# Patient Record
Sex: Female | Born: 1955 | Race: White | Hispanic: No | Marital: Married | Smoking: Never smoker
Health system: Southern US, Community
[De-identification: ages and names within clinical notes are randomized; demographics above are authoritative.]

## PROBLEM LIST (undated history)

## (undated) ENCOUNTER — Encounter (HOSPITAL_COMMUNITY): Admission: RE | Payer: Self-pay | Source: Ambulatory Visit

## (undated) DIAGNOSIS — C541 Malignant neoplasm of endometrium: Secondary | ICD-10-CM

## (undated) DIAGNOSIS — C55 Malignant neoplasm of uterus, part unspecified: Secondary | ICD-10-CM

## (undated) DIAGNOSIS — I480 Paroxysmal atrial fibrillation: Secondary | ICD-10-CM

## (undated) DIAGNOSIS — J45909 Unspecified asthma, uncomplicated: Secondary | ICD-10-CM

## (undated) DIAGNOSIS — I10 Essential (primary) hypertension: Secondary | ICD-10-CM

## (undated) DIAGNOSIS — M2550 Pain in unspecified joint: Secondary | ICD-10-CM

## (undated) DIAGNOSIS — G8929 Other chronic pain: Secondary | ICD-10-CM

## (undated) DIAGNOSIS — M25561 Pain in right knee: Secondary | ICD-10-CM

## (undated) DIAGNOSIS — K219 Gastro-esophageal reflux disease without esophagitis: Secondary | ICD-10-CM

## (undated) DIAGNOSIS — R7303 Prediabetes: Secondary | ICD-10-CM

## (undated) DIAGNOSIS — N183 Chronic kidney disease, stage 3 unspecified (CMS HCC): Secondary | ICD-10-CM

## (undated) DIAGNOSIS — G4730 Sleep apnea, unspecified: Secondary | ICD-10-CM

## (undated) DIAGNOSIS — M1990 Unspecified osteoarthritis, unspecified site: Secondary | ICD-10-CM

## (undated) DIAGNOSIS — I4891 Unspecified atrial fibrillation: Secondary | ICD-10-CM

## (undated) DIAGNOSIS — M25562 Pain in left knee: Secondary | ICD-10-CM

## (undated) DIAGNOSIS — E669 Obesity, unspecified: Secondary | ICD-10-CM

## (undated) HISTORY — PX: CARDIOVERSION: SHX1299

## (undated) HISTORY — PX: HX APPENDECTOMY: SHX54

## (undated) HISTORY — DX: Other chronic pain: G89.29

## (undated) HISTORY — PX: LAPAROSCOPIC TOTAL HYSTERECTOMY: SUR800

## (undated) HISTORY — PX: HX DILATION AND CURETTAGE: SHX78

## (undated) HISTORY — DX: Unspecified osteoarthritis, unspecified site: M19.90

## (undated) HISTORY — DX: Gastro-esophageal reflux disease without esophagitis: K21.9

## (undated) HISTORY — DX: Chronic kidney disease, stage 3 unspecified (CMS HCC): N18.30

## (undated) HISTORY — DX: Unspecified atrial fibrillation (CMS HCC): I48.91

## (undated) HISTORY — PX: COLONOSCOPY: WVUENDOPRO10

## (undated) HISTORY — DX: Unspecified asthma, uncomplicated: J45.909

## (undated) HISTORY — DX: Obesity, unspecified: E66.9

## (undated) HISTORY — DX: Pain in right knee: M25.561

## (undated) HISTORY — DX: Pain in left knee: M25.562

## (undated) HISTORY — DX: Prediabetes: R73.03

## (undated) HISTORY — DX: Essential (primary) hypertension: I10

## (undated) HISTORY — PX: HX HYSTERECTOMY: SHX81

## (undated) HISTORY — PX: DILATION AND CURETTAGE OF UTERUS: SHX78

## (undated) HISTORY — PX: NASAL SINUS SURGERY: SHX719

## (undated) HISTORY — PX: ABLATION: SHX5711

## (undated) HISTORY — DX: Paroxysmal atrial fibrillation: I48.0

## (undated) HISTORY — DX: Malignant neoplasm of endometrium: C54.1

## (undated) HISTORY — PX: APPENDECTOMY: SHX54

## (undated) HISTORY — DX: Morbid (severe) obesity due to excess calories: E66.01

## (undated) SURGERY — CARDIAC ABLATION - AV NODE
Anesthesia: Monitor Anesthesia Care

---
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
## 2002-04-07 HISTORY — PX: SINUS SURGERY: SHX187

## 2003-04-16 ENCOUNTER — Emergency Department (HOSPITAL_COMMUNITY): Admission: AD | Admit: 2003-04-16 | Discharge: 2003-04-16 | Payer: Self-pay | Admitting: Family Medicine

## 2003-04-17 ENCOUNTER — Ambulatory Visit (HOSPITAL_COMMUNITY): Admission: RE | Admit: 2003-04-17 | Discharge: 2003-04-17 | Payer: Self-pay | Admitting: Family Medicine

## 2004-01-11 ENCOUNTER — Emergency Department (HOSPITAL_COMMUNITY): Admission: EM | Admit: 2004-01-11 | Discharge: 2004-01-11 | Payer: Self-pay | Admitting: Family Medicine

## 2005-01-23 ENCOUNTER — Other Ambulatory Visit: Admission: RE | Admit: 2005-01-23 | Discharge: 2005-01-23 | Payer: Self-pay | Admitting: Obstetrics and Gynecology

## 2005-02-11 ENCOUNTER — Ambulatory Visit: Payer: Self-pay | Admitting: Cardiology

## 2005-03-06 ENCOUNTER — Ambulatory Visit: Payer: Self-pay | Admitting: Cardiology

## 2005-04-08 ENCOUNTER — Ambulatory Visit: Payer: Self-pay

## 2005-04-08 ENCOUNTER — Encounter: Payer: Self-pay | Admitting: Internal Medicine

## 2005-04-14 ENCOUNTER — Ambulatory Visit: Payer: Self-pay

## 2005-10-27 ENCOUNTER — Ambulatory Visit: Payer: Self-pay | Admitting: Internal Medicine

## 2006-04-13 ENCOUNTER — Ambulatory Visit: Payer: Self-pay | Admitting: Internal Medicine

## 2006-04-28 ENCOUNTER — Ambulatory Visit: Payer: Self-pay | Admitting: Internal Medicine

## 2006-05-25 ENCOUNTER — Ambulatory Visit: Payer: Self-pay | Admitting: Internal Medicine

## 2006-08-03 ENCOUNTER — Ambulatory Visit: Payer: Self-pay | Admitting: Internal Medicine

## 2008-07-21 ENCOUNTER — Ambulatory Visit: Payer: Self-pay | Admitting: Cardiology

## 2008-07-21 ENCOUNTER — Emergency Department (HOSPITAL_COMMUNITY): Admission: EM | Admit: 2008-07-21 | Discharge: 2008-07-21 | Payer: Self-pay | Admitting: Emergency Medicine

## 2008-11-21 ENCOUNTER — Ambulatory Visit: Payer: Self-pay | Admitting: Vascular Surgery

## 2008-11-21 ENCOUNTER — Ambulatory Visit: Admission: RE | Admit: 2008-11-21 | Discharge: 2008-11-21 | Payer: Self-pay | Admitting: Orthopedic Surgery

## 2008-11-21 ENCOUNTER — Encounter (INDEPENDENT_AMBULATORY_CARE_PROVIDER_SITE_OTHER): Payer: Self-pay | Admitting: Orthopedic Surgery

## 2008-12-14 ENCOUNTER — Encounter: Admission: RE | Admit: 2008-12-14 | Discharge: 2008-12-14 | Payer: Self-pay | Admitting: Family Medicine

## 2008-12-15 ENCOUNTER — Other Ambulatory Visit: Admission: RE | Admit: 2008-12-15 | Discharge: 2008-12-15 | Payer: Self-pay | Admitting: Family Medicine

## 2009-08-20 ENCOUNTER — Encounter: Admission: RE | Admit: 2009-08-20 | Discharge: 2009-08-20 | Payer: Self-pay | Admitting: Family Medicine

## 2009-11-23 ENCOUNTER — Encounter: Admission: RE | Admit: 2009-11-23 | Discharge: 2009-11-23 | Payer: Self-pay | Admitting: Obstetrics and Gynecology

## 2010-05-08 ENCOUNTER — Encounter: Payer: Self-pay | Admitting: Family Medicine

## 2010-05-17 ENCOUNTER — Inpatient Hospital Stay (HOSPITAL_COMMUNITY)
Admission: EM | Admit: 2010-05-17 | Discharge: 2010-05-19 | DRG: 139 | Disposition: A | Discharge status: Home / Self Care | Payer: BC Managed Care – PPO | Attending: Cardiology | Admitting: Cardiology

## 2010-05-17 ENCOUNTER — Emergency Department (HOSPITAL_COMMUNITY): Payer: BC Managed Care – PPO

## 2010-05-17 DIAGNOSIS — I4891 Unspecified atrial fibrillation: Secondary | ICD-10-CM

## 2010-05-17 DIAGNOSIS — I10 Essential (primary) hypertension: Secondary | ICD-10-CM | POA: Diagnosis present

## 2010-05-17 DIAGNOSIS — R002 Palpitations: Secondary | ICD-10-CM | POA: Diagnosis present

## 2010-05-17 DIAGNOSIS — J45909 Unspecified asthma, uncomplicated: Secondary | ICD-10-CM | POA: Diagnosis present

## 2010-05-17 DIAGNOSIS — E119 Type 2 diabetes mellitus without complications: Secondary | ICD-10-CM | POA: Diagnosis present

## 2010-05-17 LAB — GLUCOSE, CAPILLARY

## 2010-05-17 LAB — CBC
HCT: 44.3 % (ref 36.0–46.0)
Hemoglobin: 15.4 g/dL — ABNORMAL HIGH (ref 12.0–15.0)
MCV: 86.4 fL (ref 78.0–100.0)
RBC: 5.13 MIL/uL — ABNORMAL HIGH (ref 3.87–5.11)
RDW: 12.2 % (ref 11.5–15.5)
WBC: 9.4 10*3/uL (ref 4.0–10.5)

## 2010-05-17 LAB — COMPREHENSIVE METABOLIC PANEL
Alkaline Phosphatase: 103 U/L (ref 39–117)
BUN: 16 mg/dL (ref 6–23)
CO2: 24 mEq/L (ref 19–32)
Chloride: 104 mEq/L (ref 96–112)
Creatinine, Ser: 0.99 mg/dL (ref 0.4–1.2)
Glucose, Bld: 123 mg/dL — ABNORMAL HIGH (ref 70–99)
Sodium: 139 mEq/L (ref 135–145)
Total Bilirubin: 0.6 mg/dL (ref 0.3–1.2)

## 2010-05-17 LAB — CARDIAC PANEL(CRET KIN+CKTOT+MB+TROPI)
CK, MB: 1.2 ng/mL (ref 0.3–4.0)
Relative Index: 1.2 (ref 0.0–2.5)
Total CK: 100 U/L (ref 7–177)

## 2010-05-17 LAB — DIFFERENTIAL
Basophils Relative: 0 % (ref 0–1)
Eosinophils Absolute: 0.5 10*3/uL (ref 0.0–0.7)
Eosinophils Relative: 5 % (ref 0–5)
Lymphs Abs: 2.6 10*3/uL (ref 0.7–4.0)
Neutro Abs: 5.8 10*3/uL (ref 1.7–7.7)

## 2010-05-17 LAB — APTT: aPTT: 30 seconds (ref 24–37)

## 2010-05-17 LAB — POCT CARDIAC MARKERS
CKMB, poc: <1 ng/mL — ABNORMAL LOW (ref 1.0–8.0)
Troponin i, poc: <0.05 ng/mL (ref 0.00–0.09)

## 2010-05-17 LAB — BRAIN NATRIURETIC PEPTIDE: Pro B Natriuretic peptide (BNP): <30 pg/mL (ref 0.0–100.0)

## 2010-05-17 LAB — PROTIME-INR
INR: 0.98 (ref 0.00–1.49)
Prothrombin Time: 13.2 seconds (ref 11.6–15.2)

## 2010-05-18 LAB — CARDIAC PANEL(CRET KIN+CKTOT+MB+TROPI)
CK, MB: 1.1 ng/mL (ref 0.3–4.0)
CK, MB: 1 ng/mL (ref 0.3–4.0)
Relative Index: INVALID (ref 0.0–2.5)
Total CK: 95 U/L (ref 7–177)
Troponin I: 0.01 ng/mL (ref 0.00–0.06)
Troponin I: <0.01 ng/mL (ref 0.00–0.06)

## 2010-05-18 LAB — BASIC METABOLIC PANEL
CO2: 28 mEq/L (ref 19–32)
Chloride: 104 mEq/L (ref 96–112)
Glucose, Bld: 118 mg/dL — ABNORMAL HIGH (ref 70–99)
Potassium: 3.7 mEq/L (ref 3.5–5.1)

## 2010-05-18 LAB — GLUCOSE, CAPILLARY
Glucose-Capillary: 131 mg/dL — ABNORMAL HIGH (ref 70–99)
Glucose-Capillary: 128 mg/dL — ABNORMAL HIGH (ref 70–99)
Glucose-Capillary: 106 mg/dL — ABNORMAL HIGH (ref 70–99)

## 2010-05-19 LAB — BASIC METABOLIC PANEL
BUN: 11 mg/dL (ref 6–23)
Calcium: 8.5 mg/dL (ref 8.4–10.5)
Chloride: 106 mEq/L (ref 96–112)
Creatinine, Ser: 0.88 mg/dL (ref 0.4–1.2)
GFR calc Af Amer: >60 mL/min (ref >60)
GFR calc non Af Amer: >60 mL/min (ref >60)

## 2010-05-19 LAB — CBC
MCH: 30 pg (ref 26.0–34.0)
MCHC: 34.5 g/dL (ref 30.0–36.0)
MCV: 86.8 fL (ref 78.0–100.0)
Platelets: 269 10*3/uL (ref 150–400)
RDW: 12.5 % (ref 11.5–15.5)

## 2010-05-19 LAB — GLUCOSE, CAPILLARY: Glucose-Capillary: 110 mg/dL — ABNORMAL HIGH (ref 70–99)

## 2010-05-20 NOTE — Discharge Summary (Signed)
Alexis [name], PROUSE               ACCOUNT NO.:  [id number]  MEDICAL RECORD NO.:  [id number]           PATIENT TYPE:  I  LOCATION:  1238                         FACILITY:  [facility]  PHYSICIAN:  Jake Bathe, MD      DATE OF BIRTH:  January 27, 1956  DATE OF ADMISSION:  05/17/2010 DATE OF DISCHARGE:  05/19/2010                              DISCHARGE SUMMARY   CARDIOLOGIST:  Jake Bathe, MD  FINAL DIAGNOSES: 1. Newly discovered atrial fibrillation with rapid ventricular     response. 2. Morbid obesity. 3. Hypertension. 4. Asthma.  ALLERGIES:  SULFA, possibly PREDNISONE.  DISCHARGE MEDICATIONS: 1. Pradaxa 150 mg p.o. b.i.d. 2. Diltiazem extended release 360 mg once a day. 3. Lisinopril 10 mg once a day. 4. Metoprolol 25 mg twice a day. 5. Ibuprofen as needed. 6. Meloxicam as needed. 7. Symbicort. 8. Budesonide/formoterol 160/4.5 mcg 1 puff daily. 9. Tramadol as needed. 10.Ventolin 2 puffs as needed.  She is going to stop taking her lisinopril/hydrochlorothiazide 20/25 combination.  Substitute will be lisinopril 10 mg as above.  I have increased her diltiazem up to 360 and new start metoprolol 25 twice a day.  HOSPITAL COURSE:  She was admitted on May 17, 2010, with atrial fibrillation, rapid ventricular response which was symptomatic due to palpitations.  Approximately 2 years prior, she had palpitations and was evaluated by me in the clinic setting and there was no evidence of atrial fibrillation at that time.  Her atrial fibrillation for approximately 24 hours was difficult to control but with the addition with an IV drip of 15 mg per hour of diltiazem which was transitioned over p.o. as well as the addition of metoprolol 25 mg twice a day after approximately 36 hours, she converted to sinus rhythm.  Pradaxa was discussed with her for anticoagulation and I do believe that she should require anticoagulation given her other comorbidities.  We have discussed possible  GERD like side effects of Pradaxa and she should take this with a meal.  She was not interested in taking Coumadin.  It was also discussed with her that if she develops atrial fibrillation once again that we could consider antiarrhythmic therapy.  She did have a nuclear stress test done approximately 2 years ago which was overall low risk with no evidence of ischemia, however, this test was suboptimal due to body habitus.  Her echocardiogram done on this admission also was suboptimal due to body habitus.  I am unable to accurately estimated left ventricular ejection fraction.  On the night prior to discharge, she did have blood pressure during the night in the upper 90s, asymptomatic, and her heart rate after she converted got as low as 59 beats per minute.  No syncope, no dizziness. She is ambulating well.  LABORATORY DATA:  Sodium 140, potassium 3.8, BUN 11, creatinine was 0.8. White count 9.4, hemoglobin 13.4, hematocrit 38.8, platelets 269.  TSH was 4.6, slightly above normal.  Cardiac biomarkers were negative and LFTs were normal.  Chest x-ray showed decreased lung volumes with mild vascular congestion compared to prior study.  FOLLOWUP:  She is to have followup  with me in 1 week.  I have given her office number to call.  Discharge time was 35 minutes with med reconciliation, lab work, discussion with the patient, teaching.     Jake Bathe, MD     MCS/MEDQ  D:  05/19/2010  T:  05/19/2010  Job:  [id number]  Electronically Signed by Donato Schultz MD on 05/20/2010 06:36:03 AM

## 2010-05-21 NOTE — H&P (Signed)
Alexis [name], Alexis [name] NO.:  [id number]  MEDICAL RECORD NO.:  [id number]           PATIENT TYPE:  I  LOCATION:  1238                         FACILITY:  [facility]  PHYSICIAN:  Armanda Magic, M.D.     DATE OF BIRTH:  [birth year]  DATE OF ADMISSION:  05/17/2010 DATE OF DISCHARGE:                             HISTORY & PHYSICAL   REFERRING PHYSICIAN:  Lorre Nick, MD, at [facility].  CHIEF COMPLAINT:  Atrial fibrillation with rapid ventricular response of questionable duration.  HISTORY OF PRESENT ILLNESS:  This is a 55 year old obese white female with a history of morbid obesity, palpitations, and hypertension who presented to the emergency room after awakening with nausea.  She says she woke up this morning and felt very nauseated after she rolled over. She went and sat up in a chair and noticed her heart was racing.  She denied any chest pain but has some exertional weakness and dyspnea on exertion.  She also complains of lower extremity edema which she thinks has gotten worse recently, but she has a sedentary job and sits a lot. She did have several cups of caffeinated coffee last evening.  When asking her if she felt the palpitations last night, she says that she did not feel right last night, but she is not really sure she had palpitations.  PAST MEDICAL HISTORY: 1. Morbid obesity. 2. Hypertension. 3. Asthma. There is no history of documented coronary disease.  One of her prior consultations in the hospital showed there was a history of diabetes but she denies this.  ALLERGIES:  SULFA and questionably PREDNISONE.  MEDICATIONS: 1. Symbicort 160 mcg per 4.5 two puffs b.i.d. 2. Albuterol p.r.n. 3. Lisinopril 10 mg daily.  SOCIAL HISTORY:  She is married.  She denies any alcohol or tobacco use. She does not have any children.  FAMILY HISTORY:  Her mother has questionable heart problem.  Father died of an MI at 48.  She has two brothers  and three sisters, none with heart disease.  REVIEW OF SYSTEMS:  Other than what is stated in the HPI is negative.  PHYSICAL EXAM:  VITAL SIGNS:  Blood pressure is 109/37, heart rate 151 beats per minute. GENERAL:  She is a well-developed, well-nourished morbidly obese white female, in no acute distress. HEENT:  Benign. NECK:  Supple without lymphadenopathy. Carotid upstrokes +2 bilaterally. No bruits. LUNGS:  Clear to auscultation throughout. HEART:  Irregularly irregular and tachycardic.  No murmurs, rubs, or gallops. ABDOMEN:  Soft, nontender, nondistended.  Normoactive bowel sounds.  No hepatosplenomegaly. EXTREMITIES:  No edema.  LABORATORY DATA:  Sodium 139, potassium 3.8, chloride 104, bicarb 24, BUN 16, creatinine 0.99.  White cell count 9.4, hemoglobin 15.4, hematocrit 44.3, platelet count 256.  BNP less than 30, INR 0.98. EKG shows atrial fibrillation with rapid ventricular response. Point-of-care markers are negative x2. Her chest x-ray shows decreased lung volumes with mild vascular congestion.  No significant changes, otherwise.  ASSESSMENT: 1. New-onset atrial fibrillation with rapid ventricular response of     questionable duration. 2. Hypertension. 3. Asthma. 4. Morbid obesity.  PLAN:  Admit  to step-down unit.  We will start IV Cardizem drip to control heart rate and titrate to keep the heart rate less than 100 as long as systolic blood pressure is greater than 100.  Check TSH.  Subcu Lovenox per pharmacy, full dose, and we will check cardiac enzymes and check 2-D echocardiogram to assess LV function.     Armanda Magic, M.D.     TT/MEDQ  D:  05/17/2010  T:  05/18/2010  Job:  [id number]  cc:   Jake Bathe, MD  Electronically Signed by Armanda Magic M.D. on 05/21/2010 12:09:01 PM

## 2010-07-17 LAB — POCT I-STAT, CHEM 8
Calcium, Ion: 1.17 mmol/L (ref 1.12–1.32)
Creatinine, Ser: 0.8 mg/dL (ref 0.4–1.2)
Glucose, Bld: 95 mg/dL (ref 70–99)
Hemoglobin: 15 g/dL (ref 12.0–15.0)
Potassium: 4 mEq/L (ref 3.5–5.1)

## 2010-07-17 LAB — POCT CARDIAC MARKERS
CKMB, poc: <1 ng/mL — ABNORMAL LOW (ref 1.0–8.0)
CKMB, poc: <1 ng/mL — ABNORMAL LOW (ref 1.0–8.0)
Myoglobin, poc: 68.9 ng/mL (ref 12–200)
Troponin i, poc: <0.05 ng/mL (ref 0.00–0.09)

## 2010-08-08 ENCOUNTER — Other Ambulatory Visit (HOSPITAL_COMMUNITY): Payer: Self-pay | Admitting: General Surgery

## 2010-08-08 DIAGNOSIS — Z9884 Bariatric surgery status: Secondary | ICD-10-CM

## 2010-08-14 ENCOUNTER — Other Ambulatory Visit (HOSPITAL_COMMUNITY): Payer: BC Managed Care – PPO

## 2010-08-15 ENCOUNTER — Inpatient Hospital Stay (HOSPITAL_COMMUNITY): Admission: RE | Admit: 2010-08-15 | Payer: BC Managed Care – PPO | Source: Ambulatory Visit

## 2010-08-20 NOTE — Consult Note (Signed)
NAMEASTIN, RAPE NO.:  [id number]   MEDICAL RECORD NO.:  [id number]          PATIENT TYPE:  EMS   LOCATION:  MAJO                         FACILITY:  [facility]   PHYSICIAN:  Luis Abed, MD, FACCDATE OF BIRTH:  Dec 11, 1955   DATE OF CONSULTATION:  07/21/2008  DATE OF DISCHARGE:                                 CONSULTATION   I am asked to see Ms. Hartman in consultation for the evaluation of  arm  pain.  The patient has asthma.  There is no documented coronary disease.  There is a history of diabetes and hypertension.  I do not know that she  is currently on diabetic medication.  She failed to take her medicines  for 3 days because she ran out of them.  Today she awoke with a  headache.  She then felt some palpitations.  She then had some arm  discomfort.  She has had this type of arm discomfort in the past.  However today it was more marked than usual.  She went to be seen by  primary care.  EKG was abnormal and she was sent here for further  evaluation.  Earlier today her arm pain disappeared and she has felt  fine for several hours.  At the moment she is insistent on going home.   PAST MEDICAL HISTORY:   ALLERGIES:  QUESTION OF ALLERGIES TO SULFA AND PREDNISONE.   MEDICATIONS:  1. Symbicort.  2. Albuterol.  3. Lisinopril hydrochlorothiazide.   Other medical problems:  See the list below.   SOCIAL HISTORY:  The patient is married.  She does not abuse drugs and  she does not smoke.   FAMILY HISTORY:  There is no strong family history of coronary disease.   REVIEW OF SYSTEMS:  At this time, she has no fevers or chills.  She has  no change in her vision or in her hearing.  She did have a headache  earlier today but this is now gone.  She had some nausea earlier today  but this is improved.  She is not having any GU symptoms.  As outlined  she had arm pain which is now gone.  All other systems are reviewed and  are negative.   PHYSICAL EXAMINATION:   Blood pressure is 120/65 with a pulse of 70.  The  patient's respiration is 18.  The patient's husband is in the room.  She is oriented to person, time  and place.  Affect is normal.  She is significantly overweight.  HEENT:  Reveals no xanthelasma.  She has normal extraocular motion.  There are  no carotid bruits.  There is no jugular venous distention.  LUNGS:  Clear.  Respiratory effort is not labored.  CARDIAC:  Exam reveals S1-S2.  She has no clicks or significant murmurs.  Her abdomen is obese but soft.  She has large legs.  She does have trace  peripheral edema.   EKGs are reviewed very carefully.  She does have decreased anterior R-  wave progression.  She also has nonspecific T-wave changes in leads V1  and V2.  We have a tracing sent to [place] dated July 13, 2006.  It is a  faxed copy.  The quality is suboptimal but it is very clear that she has  the same decreased R wave progression in V1-V3 and nonspecific T-wave  changes in those leads.   Other labs since she has been in the emergency room include:  A troponin  that is less than 0.05 and a normal CPK MB.  A second troponin is being  checked at this time.  Potassium was 4, BUN was 11, hemoglobin 15.   Chest x-ray reveals mild cardiac enlargement and no acute findings.   PROBLEMS:  1. Hypertension.  The patient did not take her medicines for 3 days      but she now has her prescriptions filled.  2. History of asthma.  3. Question of diabetes although I do not see that she is on any      medications for this.  4. Chronic arm pain.  5. Abnormal EKG documented in the past with poor anterior R-wave      progression and nonspecific ST-T wave changes.  6. Morbid obesity.  7. *  Presentation today with a constellation of symptoms including      headache, nausea, some palpitations, some arm discomfort that      persisted.  Considering now all of the labs that we have in her      course at this point, there is no evidence of a  significant cardiac      event at this point.  A second troponin is pending.  If it is      normal, she can be allowed to go home and our team will contact her      for cardiology follow-up.      Luis Abed, MD, [facility]  Electronically Signed     JDK/MEDQ  D:  07/21/2008  T:  07/21/2008  Job:  [id number]   cc:   Jonita Albee, M.D.

## 2010-08-23 NOTE — Assessment & Plan Note (Signed)
[facility]                             PULMONARY OFFICE NOTE   [name], Alexis                        MRN:          [id number]  DATE:05/25/2006                            DOB:          1955/10/23    HISTORY:  A 55 year old white female with a history of difficult to  control asthma.  Last seen here on April 13, 2006 with the  recommendation that she maintain Symbicort at 160/4.5 two puffs b.i.d.  Take empiric Protonix at 40 mg b.i.d. before meals, which she failed to  do,  and try Singulair 10 mg q.p.m.  She said that Singulair did  nothing for her, and stopped it after a couple of weeks but is  convinced that Symbicort is helping, and that she is using less  albuterol than normal.  It turns out, however, that she is still using  albuterol 4 or 5 times a day.  She states she does not typically wake up  at night and need it.   For full inventory of medications, please face sheet accommodated  May 25, 2006.   PHYSICAL EXAMINATION:  She is a pleasant obese white female, in no acute  distress.  She had stable vital signs.  HEENT:  Unremarkable.  Oropharynx clear.  LUNG FIELDS:  Clear bilaterally to auscultation and percussion.  It was  done within 2 hours of her last albuterol use.  HEART:  Regular rhythm without murmur, gallop or rub.  ABDOMEN:  Soft and benign.  EXTREMITIES:  Warm without calf tenderness, cyanosis, clubbing or edema.   Pulmonary function tests were reviewed from January 22, and indicate an  FEV1 of only 56% predicted with a ratio of 53% and a 15% improvement  after bronchodilators.   IMPRESSION:  Clearly, this patient has severe asthma with reduction of  FEV1 below 60% while being maintained on high doses of Symbicort.  My  concern is that at present, however, she is overusing albuterol and  under using the strategy that I had given her previously to use  Symbicort first thing in the morning, and then wait to see if  she needed  albuterol (with the risk being of gradual tachyphylaxis to beta agonist  if she continues her present pattern).   The other issue is that she is morbidly obese with a likely tendency to  reflux, and needs to remember the strategy I gave her previously to use  Protonix perfectly regularly, 30 to 60 minutes before meals twice daily.   I reviewed with her the same instruction sheet that I had given her  previously (fortunately, I had a copy of it,) emphasizing that she needs  to consistently follow the instructions that she is given before it  would be appropriate to change course in another direction.   I do believe she would be a reasonable candidate for bariatric surgery,  and I have approved her for such.  In the meantime, would make every  effort to lose weight and try to maintain conditioning by regular paced  exercise.     Alexis Dalton. Sherene Sires, MD, FCCP  Electronically Signed    MBW/MedQ  DD: 05/25/2006  DT: 05/26/2006  Job #: [id number]

## 2010-08-23 NOTE — Assessment & Plan Note (Signed)
[facility]                             PULMONARY OFFICE NOTE   Alexis [name], Alexis [name]                        MRN:          [id number]  DATE:04/13/2006                            DOB:          1955/08/30    PULMONARY EXTENDED OFFICE VISIT   HISTORY:  A 55 year old white female, never smoker,  last seen in July  with difficult to control asthma for which I gave her very specific  instructions in writing including the use of Symbicort 160/4.5 two puffs  b.i.d. writing out 2 in the morning and 2 in the evening in large  letters. She failed to understand these instructions and was maintaining  herself on 1 b.i.d. dose and stopped the Singulair which said to take 1  daily and failed to follow up PFTs as recommended.   She comes back now for preoperative clearance for consideration of  bariatric surgery after having at least one severe flare up of asthma  for which she has now tapered herself off of prednisone 3 days ago and  says she is back to her usual self. She now realizes that she should  have maintained the Symbicort at 2 puff b.i.d. and has continued to do  so now with minimum need for albuterol and no nocturnal awakening.   She denies any exertional chest pain, orthopnea, PND, or leg swelling.   For full inventory of medications, please see face sheet dated April 13, 2006, but note that it looks somewhat like a battle field were the  smoke still rises with all the changes that she has made.   PHYSICAL EXAMINATION:  She is an obese, ambulatory, white female in no  acute distress. She has stable vital signs. She weighs 340 pounds.  HEENT: Is unremarkable. Oropharynx is clear.  LUNGS: Lung fields reveal inspiratory and expiratory rhonchi with  diminished breath sounds.  There is a regular rate and rhythm without murmur, gallop or rub.  ABDOMEN: Soft, benign, but quite obese.  EXTREMITIES: Warm without calf tenderness, cyanosis, clubbing or  edema.   IMPRESSION:  Difficult to control asthma. I think it is largely due to  non-adherence and is a very significant risk factor for elective surgery  of any type, especially bariatric. I told her before I could clear her  for surgery we would need to make sure she actually is taking the  medications consistently and that they are working effectively.   I spent most of the office visit today, 15 to 20 minutes, 25 minute  visit going over my previous recommendations and try to meet her half  way in terms of a reasonable conservative approach to her problems as  follows:  1. She should stay on the Symbicort 160/4.5 two puff b.i.d. but be      much more consistent about how she uses it than she has been in the      past.  2. If she has any itching and sneezing, she can use Claritin 10 mg 1      daily (the other option would be to restart Nasacort which she  stopped for reasons that are not clear and/or add back singular      which she stopped for reasons that are not clear, both of these on      a trial and error basis, which the patient had a hard time grasping      previously and I think will continue to be a major challenge).   I would like her to return for PFTs as soon as we can schedule them to  have risk stratify for her consideration for bariatric surgery, which  ideally would be great for her based on the fact that she probably has a  component of restriction related to obesity and poorly controlled  reflux, for which she is now on b.i.d. Protonix, related also directly  to obese.     Charlaine Dalton. Sherene Sires, MD, [facility]  Electronically Signed    MBW/MedQ  DD: 04/13/2006  DT: 04/13/2006  Job #: [id number]   cc:   Tracey Harries, M.D.

## 2010-08-23 NOTE — Assessment & Plan Note (Signed)
[facility]                               PULMONARY OFFICE NOTE   TRINATY, Alexis [name]                        MRN:          [id number]  DATE:10/27/2005                            DOB:          October 16, 1955    CHIEF COMPLAINT:  Asthma.   HISTORY OF PRESENT ILLNESS:  This is a 55 year old white female who states  she had asthma up to age 88 and then out grew it until the age of 55, but  since then has had asthma symptoms daily consistent with subjective wheeze,  worsening in the evening, not typically worse while sleeping, and not  responsive to multiple steroid inhalers including Flovent, Advair and now  Qvar along with Serevent.  She says even on her best days she is constantly  aware that she is wheezing, and for that reason, [name] at Dr. Bonney Leitz  office, had recommended that she empirically take PPI therapy which she is  not consistent about doing and is not really convinced that she has reflux.  She states that typical triggers for her attack include heat, cigarette  smoke, dust.  She was evaluated already at [facility] in 2005 and found to  have bad allergies to dust mostly.  She denies any exertional chest pain,  orthopnea, PND or leg swelling, nocturnal exacerbation of asthma, wheezing  or cough.   PAST MEDICAL HISTORY:  Significant for appendectomy and morbid obesity.   ALLERGIES:  SULFA.   MEDICATIONS:  1.  Albuterol b.i.d.  2.  Proventil q.i.d.  3.  Qvar two puffs b.i.d.  4.  Serevent one puff b.i.d.  5.  Nasacort two puffs daily.  6.  Protonix that she does not take consistently.  7.  She had received a course of prednisone two months ago, even on      prednisone, continued to have subjective wheezing and need for frequent      Albuterol.   SOCIAL HISTORY:  She has rare alcohol, denies any cigarette use.  Denies any  cigarette history.  Works at a call center on the phone.   FAMILY HISTORY:  Recorded in detail significant for  asthma and allergies.   REVIEW OF SYSTEMS:  Taken in detail and significant for the problems as  outlined above.   PHYSICAL EXAMINATION:  GENERAL:  A pleasant, ambulatory, massively obese  white female in no acute distress.  VITAL SIGNS:  Afebrile with normal vital signs.  HEENT:  Moderate turbinate.  EOMI.  Oropharynx is clear.  No excessive post  nasal drainage.  NECK:  Supple without cervical adenopathy or tenderness.  Trachea is midline  with no thyromegaly.  LUNGS:  Fields reveal pan expiratory wheeze bilaterally with both  inspiratory and expiratory components and also upper and lower airway  components.  HEART:  Regular rate and rhythm with distant S1, S2.  ABDOMEN:  Soft, benign with no palpable organomegaly, mass or tenderness.  EXTREMITIES:  Warm without calf tenderness, cyanosis, clubbing or edema.   IMPRESSION:  Difficult to control asthma despite appropriate treatment  with topical steroids in multiple forms.  I  agree that reflux from morbid  obesity and also the effects of obesity are driving a large component of her  problems, and this patient has a low expectation of what a best day should  be because of years of wheezing daily.   Because she has not tried it yet, I think it is worth switching from Qvar to  Symbicort 160/4.5 two puffs b.i.d. combined with Singulair 10 mg q.p.m.  (only about 20% of patients responded, but since she does not remember  receiving Singulair before, I think it is worth trying.) and emphasize to  the patient that the goal of therapy is supposed to relieve her symptoms and  also eliminate the need for multiple beta II agonists (Note:  She is on both  Albuterol and Proventil for synptoms).  I have asked her to wait and let the  Symbicort work before using the backup albuterol and also recommend for  cough to use Delsym b.i.d.   To treat reflux aggressively, I recommended taking Protonix consistently,  for which the patient failed to follow  instructions, and take it not only  before breakfast but also before supper.  I gave her plenty of samples in  hopes that compliance will become less of an issue.  I also reviewed the  very strict guidelines for the non-medical treatment of reflux including  dietary modification.   Long-term, I am going to refer her back to Urgent Care for management of  morbid obesity which is obviously going to be a major challenge to this  patient but may play a large role in terms of determining whether or not she  responds to asthma therapy also, and also emphasize that avoidance of  systemic storage is a reasonable goal here because of the issue of  contributing to obesity.   FOLLOWUP:  Follow up in four weeks with PFT.                                   Charlaine Dalton. Sherene Sires, MD, [facility]   MBW/MedQ  DD:  10/27/2005  DT:  10/28/2005  Job #:  [id number]   cc:   Tracey Harries, MD

## 2010-08-23 NOTE — Assessment & Plan Note (Signed)
[facility]                             PULMONARY OFFICE NOTE   CHARDONNAY, HOLZMANN                        MRN:          [id number]  DATE:08/03/2006                            DOB:          27-Jul-1955    PULMONARY/EXTENDED FOLLOWUP OFFICE VISIT:   HISTORY:  [age]-year-old white female with morbid obesity, considering  bariatric surgery, with difficult-to-control asthma that has been  doing much better on the combination of Symbicort 160/4.5 two puffs  b.i.d., with minimum use of Proventil as long as she was taking Protonix  b.i.d.  She says she could not afford it and reduced the dose back to  one per day and since then has been having increasing symptoms of  heartburn and increasing need for Proventil.  She, however, sleeps well  at night with head of bed elevated at 30 degrees with no nocturnal  attacks of dyspnea, cough, wheeze or need for Proventil.   She denies any pleuritic or exertional chest pain, orthopnea, PND, leg  swelling, purulent sputum or active sinus symptoms.   PHYSICAL EXAMINATION:  She is a slightly hoarse, ambulatory, obese white  female in no acute distress, weighing 332 pounds, which is no change  from baseline.  HEENT:  Unremarkable.  Oropharynx clear.  LUNGS:  Fields are completely clear bilaterally to auscultation and  percussion after taking only Symbicort this morning.  There is a regular rhythm without murmur, gallop or rub.  ABDOMEN:  Soft and benign.  EXTREMITIES:  Warm, without calf tenderness, cyanosis, clubbing or  edema.   MDI technique was reviewed and now is close to 100% (she only lacks from  a relatively small total inspiratory volume with relatively short  inspiratory time on that basis and this is an effect of obesity that  cannot be overcome by slowing down or exhaling to a lower residual  volume).   IMPRESSION:  1. Most of the asthmatic component of her problem has been controlled      on Symbicort  160/4.5 two puffs b.i.d.  2. She has overt heartburn despite taking Protonix once daily and      needs to increase to b.i.d. when she can afford to either get the      prescription filled or get over-the-counter medications.  An      alternative that I introduced to her today was to see a      gastroenterology specialist, either to get this preapproved or      consider a Nissen fundoplication (note that she is also considering      bariatric surgery, so both issues, both her morbid obesity and the      potential that reflux is contributing to her asthma, need to be      considered in the context of surgical planning).   I have referred her back to Urgent Care for regular medical followup,  but would be happy to have her seen here by our gastroenterology  specialist if desired.   Followup will therefore be in 4 weeks with another set of pulmonary  function tests before and after bronchodilators  to see to what extent  she still has reversible obstruction that would need further adjustment  in terms of medications versus more aggressive treatment directed at  reflux.     Charlaine Dalton. Sherene Sires, MD, [facility]  Electronically Signed    MBW/MedQ  DD: 08/03/2006  DT: 08/04/2006  Job #: [id number]   cc:   [facility], [address] [place], [place]  [address] Durenda Guthrie PA-C

## 2010-08-29 ENCOUNTER — Encounter: Payer: BC Managed Care – PPO | Attending: General Surgery | Admitting: *Deleted

## 2010-08-29 DIAGNOSIS — Z713 Dietary counseling and surveillance: Secondary | ICD-10-CM | POA: Insufficient documentation

## 2010-08-29 DIAGNOSIS — Z09 Encounter for follow-up examination after completed treatment for conditions other than malignant neoplasm: Secondary | ICD-10-CM | POA: Insufficient documentation

## 2010-08-29 DIAGNOSIS — Z9884 Bariatric surgery status: Secondary | ICD-10-CM | POA: Insufficient documentation

## 2010-09-08 IMAGING — US US TRANSVAGINAL NON-OB
1 series · 13 of 25 positions shown · non-contrast
Comparison: None.

CLINICAL DATA: History of ovarian cyst on outside ultrasound.
Right lower quadrant pain.



[Series 1: us transvaginal non-ob · 0.29mm/px · 13 of 66 slices shown]
[im 1/66]
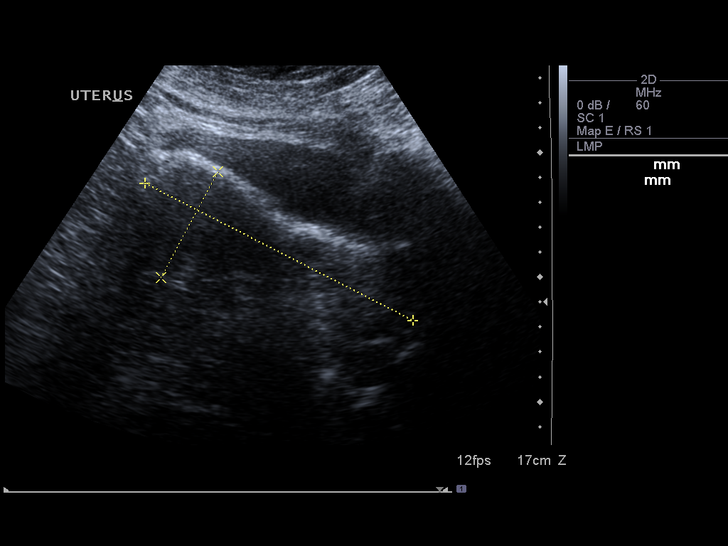
[im 6/66]
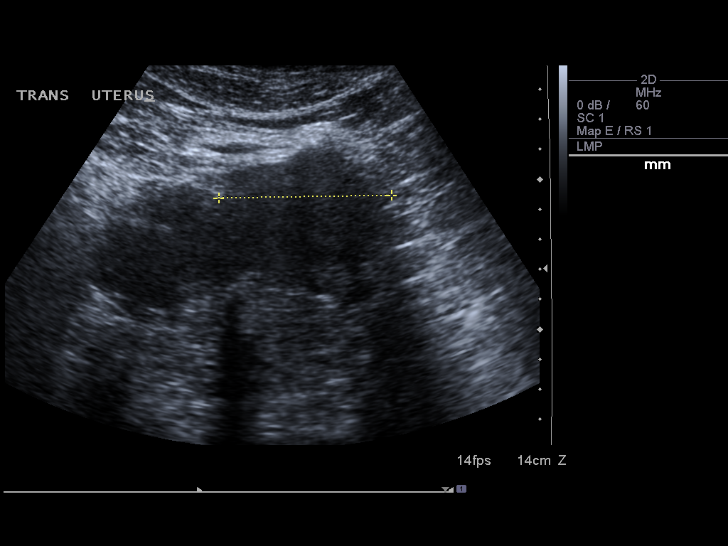
[im 11/66]
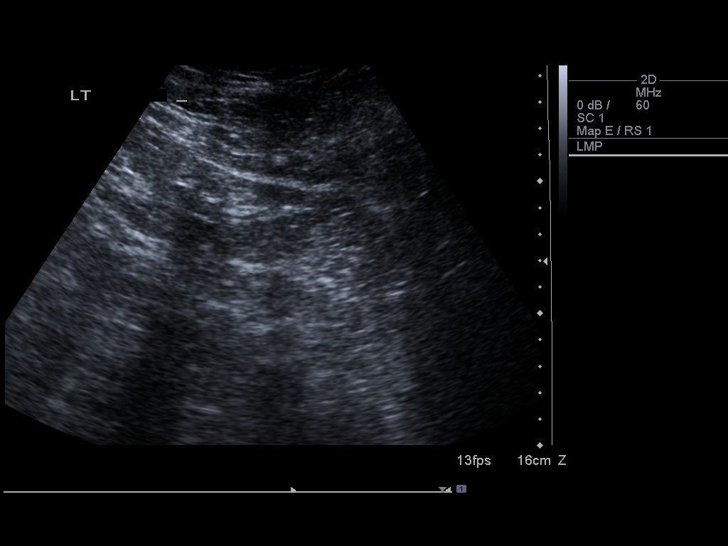
[im 17/66]
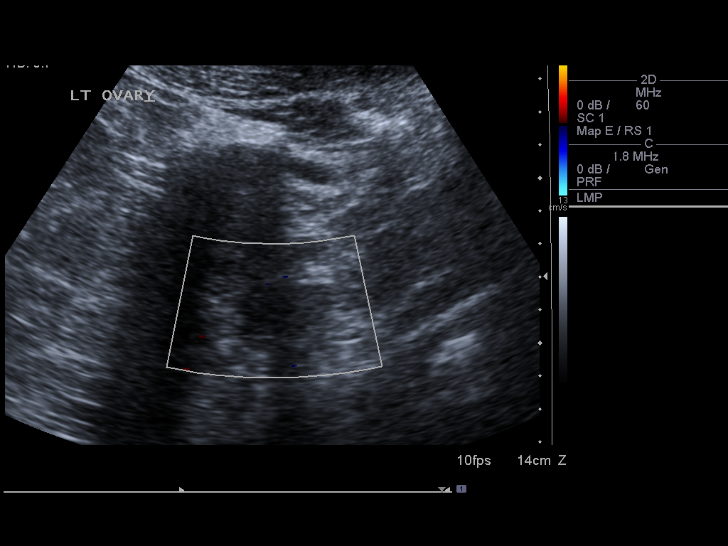
[im 22/66]
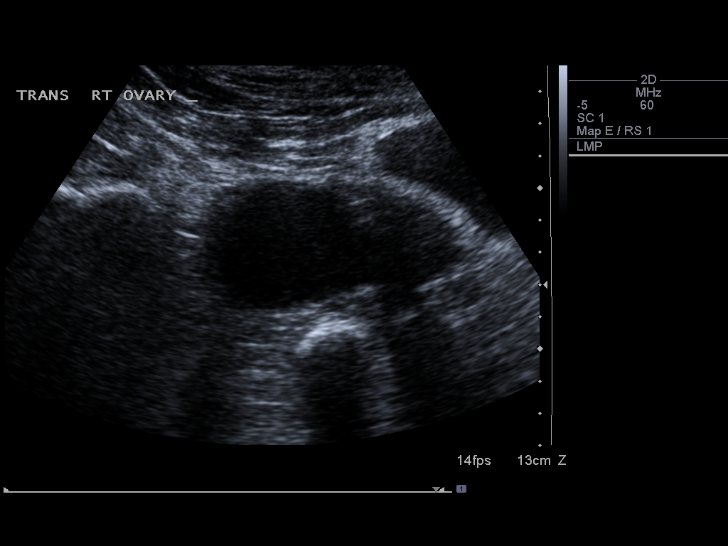
[im 28/66]
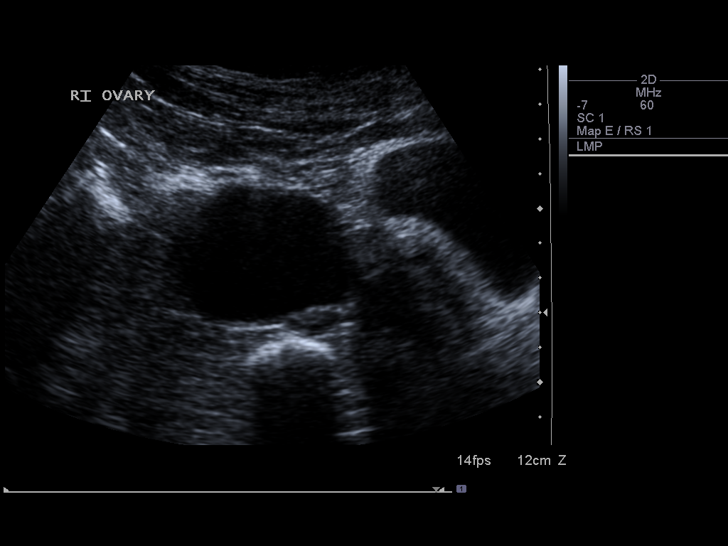
[im 33/66]
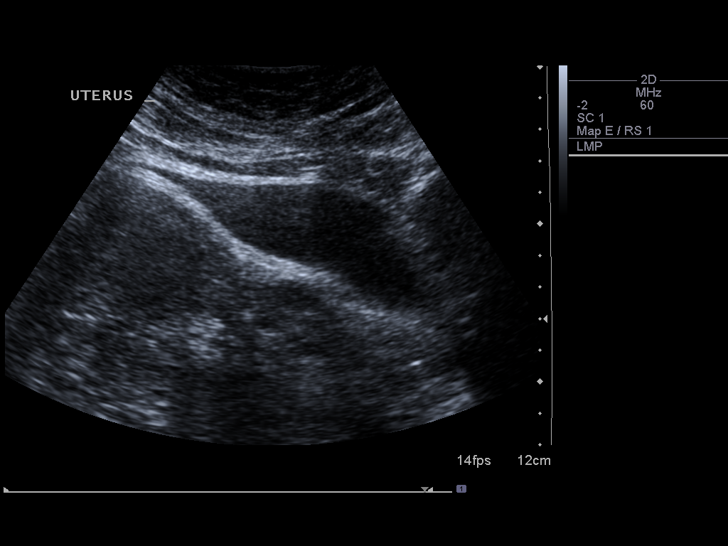
[im 38/66]
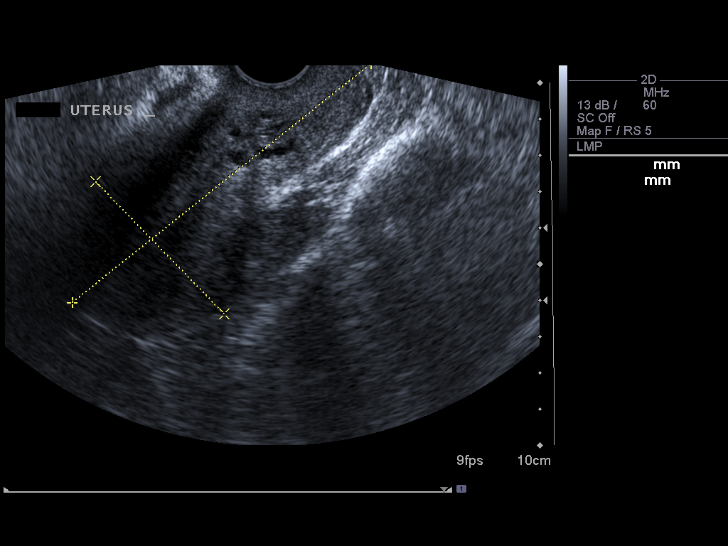
[im 44/66]
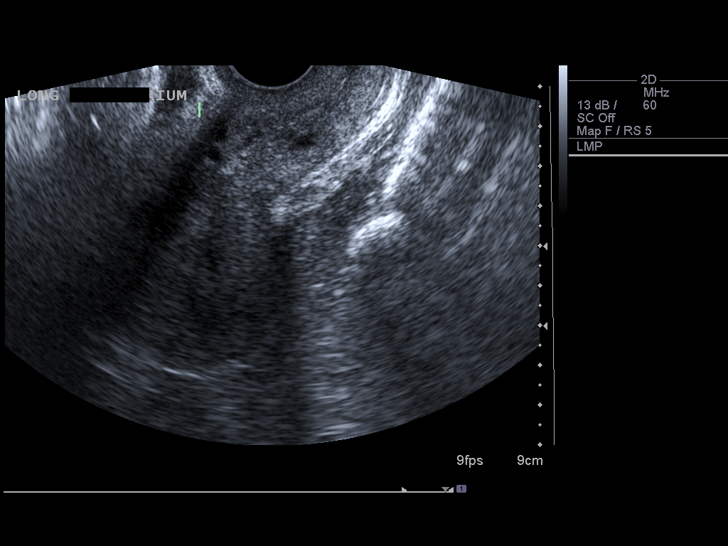
[im 49/66]
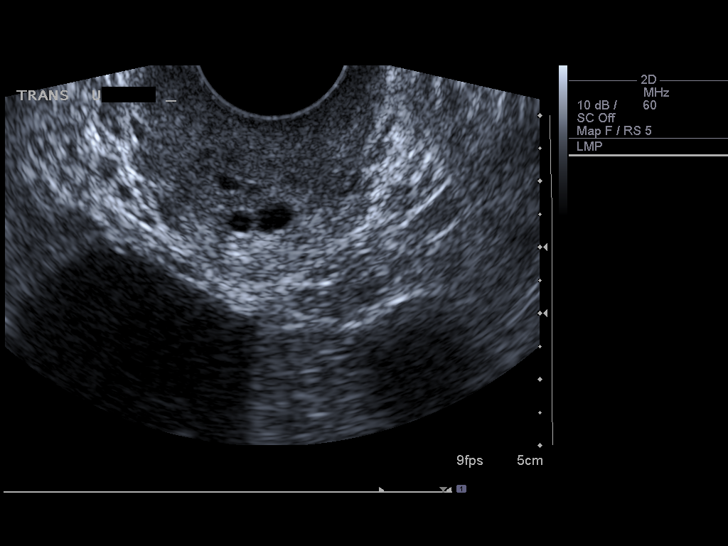
[im 55/66]
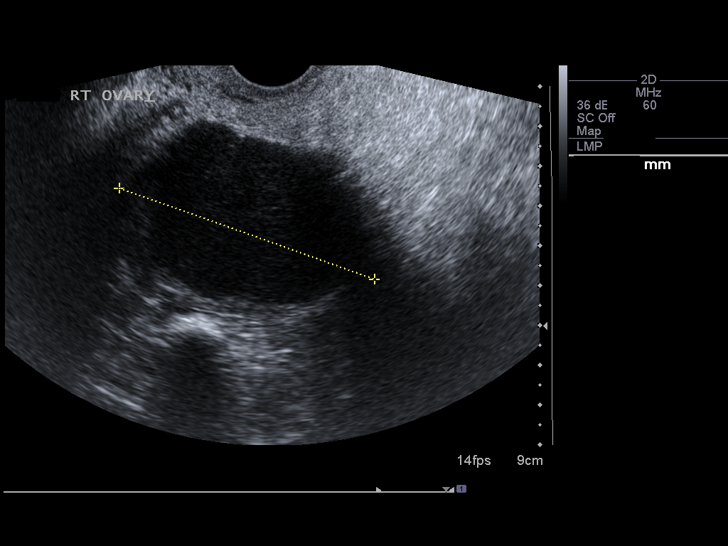
[im 60/66]
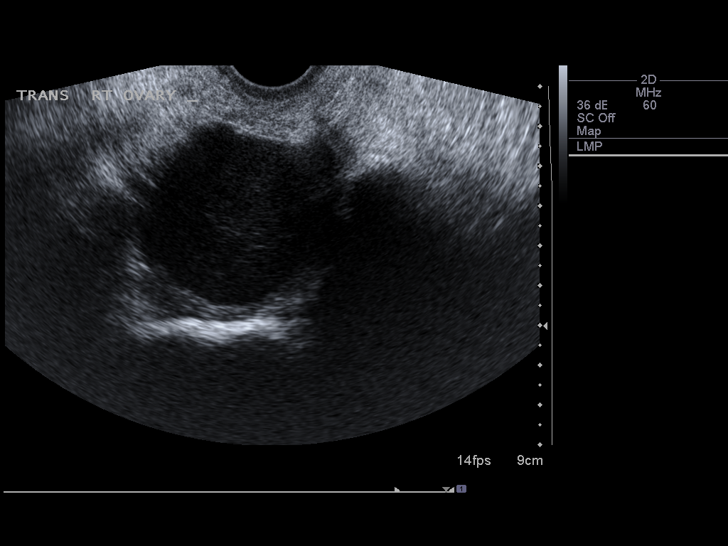
[im 66/66]
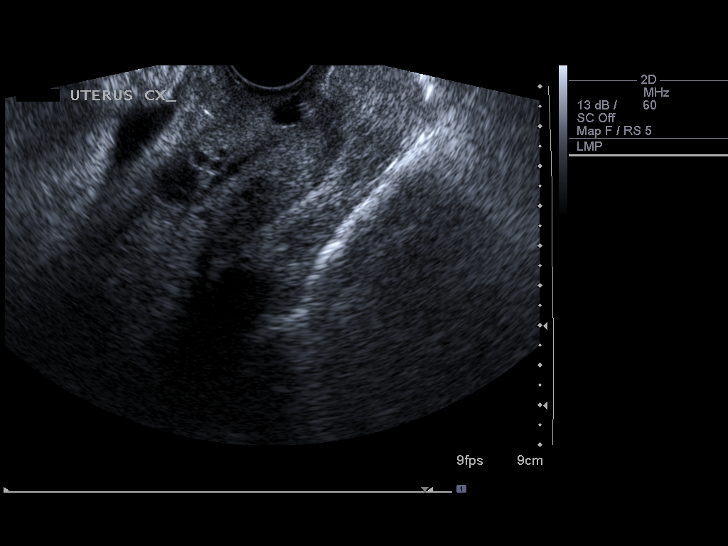

[13 of 25 positions shown; findings below may reference images not displayed]

FINDINGS: Uterus the uterus is retroflexed and demonstrates a sagittal length
of 10.5 cm, an AP depth of 5.1 cm and a transverse width of 4.5 cm.
Evaluation of the myometrium is compromised by patient body habitus
on transabdominal approach and uterine positioning on endovaginal
approach.  No definite focal myometrial abnormalities are seen.

Endometrium is poorly delineated due to scanning parameters.

Right Ovary measures 6.8 x 5.9 x 4.9 cm and contains a unilocular
complex cyst measuring 5.4 x 4.3 x 4.5 cm.  This contains diffuse
low level echoes and has an appearance sonographically most
suspicious for a endometrioma although a hemorrhagic cyst can have
a similar appearance

Left Ovary is seen only transabdominally measuring 3.4 x 3.4 x
cm and containing a unilocular cystic lesion measuring 1.9 x 1.8 x
1.9 cm.  Delineation of the internal architecture of this cyst is
not possible transabdominally due to patient body habitus.  This
may represent a dominant follicle given the size.

Other Findings:  No pelvic fluid is seen.
IMPRESSION: Overall poor scanning parameters as described above.  No definite
focal myometrial abnormalities.  Incompletely assessed endometrium.

Complex right ovarian cyst with sonographic features most
suggestive of an endometrioma.  Follow-up evaluation can be
undertaken in the postsecretory phase of the cycle following the
next complete cycle.  If this represents an endometrioma, no
interval change would be seen as would be anticipated with a
hemorrhagic cyst.

## 2011-09-10 ENCOUNTER — Encounter (HOSPITAL_BASED_OUTPATIENT_CLINIC_OR_DEPARTMENT_OTHER): Payer: BC Managed Care – PPO | Attending: General Surgery

## 2011-09-10 DIAGNOSIS — R7309 Other abnormal glucose: Secondary | ICD-10-CM | POA: Insufficient documentation

## 2011-09-10 DIAGNOSIS — Z79899 Other long term (current) drug therapy: Secondary | ICD-10-CM | POA: Insufficient documentation

## 2011-09-10 DIAGNOSIS — S81009A Unspecified open wound, unspecified knee, initial encounter: Secondary | ICD-10-CM | POA: Insufficient documentation

## 2011-09-10 DIAGNOSIS — I10 Essential (primary) hypertension: Secondary | ICD-10-CM | POA: Insufficient documentation

## 2011-09-10 DIAGNOSIS — X58XXXA Exposure to other specified factors, initial encounter: Secondary | ICD-10-CM | POA: Insufficient documentation

## 2011-09-10 NOTE — Progress Notes (Signed)
[facility]  NAME:  Alexis [name], Alexis [name]               ACCOUNT NO.:  [id number]  MEDICAL RECORD NO.:  [id number]      DATE OF BIRTH:  Feb 25, 1956  PHYSICIAN:  Maxwell Caul, M.D. VISIT DATE:  09/10/2011                                  OFFICE VISIT   [name] is here for review of wound on her anterior left leg.  She tells me that she has had this off and on for 2-3 years.  This will heal and then very shortly thereafter open again.  She feels that she probably has chronic edema related to venous insufficiency and that is the cause of her wounds.  She does not describe claudication.  She is a prediabetic but is diet controlled and is not currently on anything for diabetes.  PAST MEDICAL HISTORY:  Includes: 1. Morbid obesity. 2. Hypertension. 3. Asthma. 4. Bilateral knee osteoarthritis. 5. Vitamin D deficiency. 6. AFib. 7. Borderline diabetes. 8. Chronic renal disease. 9. Tubular adenoma.  PAST SURGICAL HISTORY:  Sinus surgery, appendectomy.  MEDICATION:  List is reviewed.  EXAMINATION:  VITAL SIGNS: Her temperature was 98.3, pulse 109, respirations 20, blood pressure 92/62. RESPIRATORY: Revealed clear air entry bilaterally. CARDIAC: Heart sounds were distant but no murmurs were heard.  She appears to be euvolemic. EXTREMITIES CIRCULATION: Her peripheral pulses in her feet were easily palpable.  Her capillary refill time was roughly 4 seconds.  We could not calculate her ABIs on the left leg due to pain.  The area in question is over left leg measured 1.4 x 1.3 x 0.1.  The area was anesthetized with topical lidocaine.  I removed some surface eschar (non-excisional debridement) The area around here was tender somewhat red and regular.  IMPRESSIONS: 1. Left leg wound.  This probably is mostly a venous stasis ulcer,     although she did not have an extensive amount of edema today.  I     reviewed the culture they came with her, which actually shows  coag-     negative staph.  (not MRSA or even Staph aureus).  She states she     had a course of doxycycline last month and this helped but it was     only 7 days; therefore, I have dressed this with silver alginate     under Kerlix Coban wrap.  I have given her a prescription for     doxycycline 100 b.i.d. for 2 weeks.  If this area does not look     considerably better after that time frame, I think she may need a     punch biopsy.  Certainly, the entire area looks somewhat atypical     to me     that nonhealing over 2-3 years with some closure and then reopening     might also be compatible with an underlying skin malignancy.     However, I will defer this for a week or 2 to see what antibiotics,     topical dressing, is in compression due to this area.  We will see     her again next week.          ______________________________ Maxwell Caul, M.D.     MGR/MEDQ  D:  09/10/2011  T:  09/10/2011  Job:  [id number]

## 2011-10-08 ENCOUNTER — Encounter (HOSPITAL_BASED_OUTPATIENT_CLINIC_OR_DEPARTMENT_OTHER): Payer: BC Managed Care – PPO | Attending: General Surgery

## 2011-10-08 DIAGNOSIS — I10 Essential (primary) hypertension: Secondary | ICD-10-CM | POA: Insufficient documentation

## 2011-10-08 DIAGNOSIS — S81009A Unspecified open wound, unspecified knee, initial encounter: Secondary | ICD-10-CM | POA: Insufficient documentation

## 2011-10-08 DIAGNOSIS — R7309 Other abnormal glucose: Secondary | ICD-10-CM | POA: Insufficient documentation

## 2011-10-08 DIAGNOSIS — Z79899 Other long term (current) drug therapy: Secondary | ICD-10-CM | POA: Insufficient documentation

## 2011-10-08 DIAGNOSIS — X58XXXA Exposure to other specified factors, initial encounter: Secondary | ICD-10-CM | POA: Insufficient documentation

## 2012-10-05 ENCOUNTER — Other Ambulatory Visit: Payer: Self-pay | Admitting: Nurse Practitioner

## 2012-10-05 ENCOUNTER — Other Ambulatory Visit: Payer: Self-pay | Admitting: Obstetrics and Gynecology

## 2012-10-05 ENCOUNTER — Telehealth: Payer: Self-pay | Admitting: Cardiology

## 2012-10-05 ENCOUNTER — Other Ambulatory Visit (HOSPITAL_COMMUNITY)
Admission: RE | Admit: 2012-10-05 | Discharge: 2012-10-05 | Disposition: A | Discharge status: Home / Self Care | Payer: BC Managed Care – PPO | Source: Ambulatory Visit | Attending: Obstetrics and Gynecology | Admitting: Obstetrics and Gynecology

## 2012-10-05 DIAGNOSIS — Z1151 Encounter for screening for human papillomavirus (HPV): Secondary | ICD-10-CM | POA: Insufficient documentation

## 2012-10-05 DIAGNOSIS — N760 Acute vaginitis: Secondary | ICD-10-CM | POA: Insufficient documentation

## 2012-10-05 DIAGNOSIS — R142 Eructation: Secondary | ICD-10-CM

## 2012-10-05 DIAGNOSIS — R141 Gas pain: Secondary | ICD-10-CM

## 2012-10-05 DIAGNOSIS — Z01419 Encounter for gynecological examination (general) (routine) without abnormal findings: Secondary | ICD-10-CM | POA: Insufficient documentation

## 2012-10-05 NOTE — Telephone Encounter (Signed)
Called by patient after accidentally taking evening dose of xarelto, lisinopril, diltiazem and lasix after already taking them this morning. No syncope, presyncope, bleeding, excessive bruising, dizziness. Checked her BP at home and it was 130s systolic and pulse 60s. Recommended she take it easy and reviewed indications to seek emergency care (see above). Recommended pill box rather than pulling individually from pill bottle. FYI to Dr. Anne Fu.

## 2012-10-06 NOTE — Telephone Encounter (Signed)
Agree with above 

## 2012-10-11 ENCOUNTER — Other Ambulatory Visit: Payer: BC Managed Care – PPO

## 2012-12-07 ENCOUNTER — Ambulatory Visit (HOSPITAL_COMMUNITY): Payer: BC Managed Care – PPO

## 2012-12-09 ENCOUNTER — Ambulatory Visit (HOSPITAL_COMMUNITY)
Admission: RE | Admit: 2012-12-09 | Discharge: 2012-12-09 | Disposition: A | Discharge status: Home / Self Care | Payer: BC Managed Care – PPO | Source: Ambulatory Visit | Attending: Obstetrics and Gynecology | Admitting: Obstetrics and Gynecology

## 2012-12-09 DIAGNOSIS — N80109 Endometriosis of ovary, unspecified side, unspecified depth: Secondary | ICD-10-CM | POA: Insufficient documentation

## 2012-12-09 DIAGNOSIS — N801 Endometriosis of ovary: Secondary | ICD-10-CM | POA: Insufficient documentation

## 2012-12-09 DIAGNOSIS — R142 Eructation: Secondary | ICD-10-CM | POA: Insufficient documentation

## 2012-12-09 DIAGNOSIS — N950 Postmenopausal bleeding: Secondary | ICD-10-CM | POA: Insufficient documentation

## 2012-12-09 DIAGNOSIS — R141 Gas pain: Secondary | ICD-10-CM

## 2012-12-13 ENCOUNTER — Ambulatory Visit (HOSPITAL_COMMUNITY): Payer: BC Managed Care – PPO

## 2013-01-11 ENCOUNTER — Other Ambulatory Visit: Payer: Self-pay | Admitting: Nurse Practitioner

## 2013-01-21 ENCOUNTER — Ambulatory Visit (INDEPENDENT_AMBULATORY_CARE_PROVIDER_SITE_OTHER): Payer: BC Managed Care – PPO | Admitting: Cardiology

## 2013-01-21 ENCOUNTER — Encounter: Payer: Self-pay | Admitting: Cardiology

## 2013-01-21 VITALS — BP 128/76 | HR 60 | Ht 64.0 in | Wt 365.0 lb

## 2013-01-21 DIAGNOSIS — I4891 Unspecified atrial fibrillation: Secondary | ICD-10-CM

## 2013-01-21 DIAGNOSIS — J45909 Unspecified asthma, uncomplicated: Secondary | ICD-10-CM

## 2013-01-21 DIAGNOSIS — C541 Malignant neoplasm of endometrium: Secondary | ICD-10-CM | POA: Insufficient documentation

## 2013-01-21 DIAGNOSIS — J4540 Moderate persistent asthma, uncomplicated: Secondary | ICD-10-CM | POA: Insufficient documentation

## 2013-01-21 DIAGNOSIS — C549 Malignant neoplasm of corpus uteri, unspecified: Secondary | ICD-10-CM

## 2013-01-21 DIAGNOSIS — Z01810 Encounter for preprocedural cardiovascular examination: Secondary | ICD-10-CM

## 2013-01-21 DIAGNOSIS — I10 Essential (primary) hypertension: Secondary | ICD-10-CM

## 2013-01-21 DIAGNOSIS — I480 Paroxysmal atrial fibrillation: Secondary | ICD-10-CM | POA: Insufficient documentation

## 2013-01-21 HISTORY — DX: Malignant neoplasm of endometrium: C54.1

## 2013-01-21 HISTORY — DX: Unspecified asthma, uncomplicated: J45.909

## 2013-01-21 NOTE — Patient Instructions (Signed)
Your physician recommends that you continue on your current medications as directed. Please refer to the Current Medication list given to you today.  Your physician recommends that you schedule a follow-up appointment in: 6 weeks with Dr. Anne Fu.

## 2013-01-21 NOTE — Progress Notes (Signed)
[address] [place], [place]  [address] Phone: [phone] Fax:  [phone]  Date:  01/21/2013   ID:  Alexis [name], DOB April 12, 1955, MRN [id number]  PCP:  Emeterio Reeve, MD   History of Present Illness: Alexis [name] is a 57 y.o. female with paroxysmal atrial fibrillation, morbid obesity, hypertension, diet-controlled diabetes, statin intolerance, chronic kidney disease stage II here for followup.  Recently she had been having dysfunctional uterine bleeding and was discovered to have endometrial cancer stage I and will be soon undergoing hysterectomy by Dr. Clifton James in [place]. She has been off of her anticoagulation, previously on Pradaxa. No bleeding since. She is able to perform greater than 4 METs of activity without difficulty. No anginal symptoms with daily activities. No shortness of breath. Prior nuclear stress test in 2010 was low risk but suboptimal, normal ejection fraction.  She has also been having some left arm muscle spasm-like sensation sometimes right arm. She has been off of her statin medication for quite some time, several months and this has not changed her symptoms.    Wt Readings from Last 3 Encounters:  01/21/13 365 lb (165.563 kg)     Past Medical History  Diagnosis Date  . Paroxysmal a-fib   . Morbid obesity   . HTN (hypertension)     No past surgical history on file.  Current Outpatient Prescriptions  Medication Sig Dispense Refill  . albuterol (PROVENTIL HFA;VENTOLIN HFA) 108 (90 BASE) MCG/ACT inhaler Inhale 2 puffs into the lungs every 6 (six) hours as needed for wheezing.      . budesonide-formoterol (SYMBICORT) 160-4.5 MCG/ACT inhaler Inhale 2 puffs into the lungs 2 (two) times daily.      Marland Kitchen diltiazem (TIAZAC) 360 MG 24 hr capsule Take 360 mg by mouth daily.      . furosemide (LASIX) 40 MG tablet Take 40 mg by mouth.      Marland Kitchen lisinopril (PRINIVIL,ZESTRIL) 10 MG tablet Take 10 mg by mouth daily.      . metoprolol  succinate (TOPROL-XL) 25 MG 24 hr tablet Take 25 mg by mouth daily.      . sertraline (ZOLOFT) 50 MG tablet Take 50 mg by mouth daily.      . simvastatin (ZOCOR) 20 MG tablet Take 20 mg by mouth every evening.       No current facility-administered medications for this visit.    Allergies:    Allergies  Allergen Reactions  . Prednisone   . Statins   . Sulfa Antibiotics     Social History:  The patient  reports that she has never smoked. She does not have any smokeless tobacco history on file.   ROS:  Please see the history of present illness.   Denies any syncope, orthopnea, dizziness, chest pain, significant palpitations. Newly discovered rash.  All other systems reviewed and negative.   PHYSICAL EXAM: VS:  BP 128/76  Pulse 60  Ht 5\' 4"  (1.626 m)  Wt 365 lb (165.563 kg)  BMI 62.62 kg/m2 Well nourished, well developed, in no acute distress HEENT: normal Neck: no JVD Cardiac:  normal S1, S2; RRR; no murmur Lungs:  clear to auscultation bilaterally, no wheezing, rhonchi or rales Abd: soft, nontender, no hepatomegalyObese Ext: no edema Skin: warm and dryover her chest wall  Neuro: no focal abnormalities noted  EKG:  01/21/13-sinus rhythm, 60, low voltage likely secondary to body habitus. Change from prior EKG.    ASSESSMENT AND PLAN:  1. Preoperative  risk assessment prior to hysterectomy secondary to endometrial cancer stage I-I do believe that she may proceed with surgery from a cardiac perspective and she is of low to moderate risk mostly from morbid obesity. Prior nuclear stress test overall reassuring of course suboptimal due to body habitus. EKG today reassuring showing sinus rhythm. There is no other changes that should be done to optimize her overall cardiovascular status prior to surgery. 2. Paroxysmal atrial fibrillation-I discussed with her that it would not be unusual or surprising to see evidence of atrial fibrillation especially in the postoperative period. If  necessary, increase beta blocker. Continue with diltiazem as well as metoprolol for now. He has not had any frequent episodes. Currently in sinus rhythm. Please let me know if I can be of further assistance. 3. Morbid obesity-encourage weight loss. 4. Asthma-prior history, taken care of by Dr. Sherene Sires in the past. 5. Hypertension-currently under good control. No changes made. 6. Dysfunctional uterine bleeding/endometrial cancer stage I. Her bleeding was exacerbated by anticoagulants. I would like for her to resume anticoagulation following hysterectomy when felt that by Dr. Clifton James. 7. We will see her back in approximate 6 weeks.  Signed, Donato Schultz, MD [facility]  01/21/2013 10:44 AM

## 2013-01-24 ENCOUNTER — Other Ambulatory Visit: Payer: Self-pay | Admitting: *Deleted

## 2013-01-24 DIAGNOSIS — I4891 Unspecified atrial fibrillation: Secondary | ICD-10-CM

## 2013-01-24 DIAGNOSIS — Z79899 Other long term (current) drug therapy: Secondary | ICD-10-CM

## 2013-01-27 ENCOUNTER — Other Ambulatory Visit: Payer: Self-pay | Admitting: Family Medicine

## 2013-01-27 DIAGNOSIS — N63 Unspecified lump in unspecified breast: Secondary | ICD-10-CM

## 2013-02-08 ENCOUNTER — Encounter: Payer: Self-pay | Admitting: Cardiology

## 2013-02-10 ENCOUNTER — Other Ambulatory Visit: Payer: BC Managed Care – PPO

## 2013-02-17 ENCOUNTER — Telehealth: Payer: Self-pay | Admitting: Cardiology

## 2013-02-17 NOTE — Telephone Encounter (Signed)
New message    Had appt with oncologist---they said the pt can go back on her blood thinner.  Do Dr Anne Fu want her to go back on Xarelto or something elso.  Was having bleeding on Xarelto but that may have been from the cancer.  She is not going to have surgery at this time.

## 2013-02-17 NOTE — Telephone Encounter (Signed)
Pt. Called because she had an oncology Dr.S  appointment today. She was put on medication instead of having surgery ( hysterectomy). Pt was told that she can start on the blood thinner. Pt wants to know if Dr. Anne Fu wants for her to go back taking Xarelto or does he wants to prescribed some other medication. Pt states she is not bleeding now, but as soon as she start the medication she thinks she will start  bleeding again. Pt has not had any arrhythmias at this time.

## 2013-02-18 NOTE — Telephone Encounter (Signed)
Go ahead and restart Xarelto. Monitor for any signs of bleeding. If bleeding occurs, let me know.

## 2013-02-22 NOTE — Telephone Encounter (Signed)
Spoke with patient advised to restart Xarelto, if blooding starts to stop taking and contact the office.

## 2013-03-02 ENCOUNTER — Other Ambulatory Visit: Payer: BC Managed Care – PPO

## 2013-03-08 ENCOUNTER — Telehealth: Payer: Self-pay | Admitting: Cardiology

## 2013-03-08 ENCOUNTER — Emergency Department (HOSPITAL_COMMUNITY): Payer: BC Managed Care – PPO

## 2013-03-08 ENCOUNTER — Encounter (HOSPITAL_COMMUNITY): Payer: Self-pay | Admitting: Emergency Medicine

## 2013-03-08 ENCOUNTER — Emergency Department (HOSPITAL_COMMUNITY)
Admission: EM | Admit: 2013-03-08 | Discharge: 2013-03-08 | Disposition: A | Discharge status: Home Health | Payer: BC Managed Care – PPO | Attending: Emergency Medicine | Admitting: Emergency Medicine

## 2013-03-08 DIAGNOSIS — R209 Unspecified disturbances of skin sensation: Secondary | ICD-10-CM | POA: Insufficient documentation

## 2013-03-08 DIAGNOSIS — R202 Paresthesia of skin: Secondary | ICD-10-CM

## 2013-03-08 DIAGNOSIS — M545 Low back pain, unspecified: Secondary | ICD-10-CM | POA: Insufficient documentation

## 2013-03-08 DIAGNOSIS — J45909 Unspecified asthma, uncomplicated: Secondary | ICD-10-CM | POA: Insufficient documentation

## 2013-03-08 DIAGNOSIS — R002 Palpitations: Secondary | ICD-10-CM | POA: Insufficient documentation

## 2013-03-08 DIAGNOSIS — Z79899 Other long term (current) drug therapy: Secondary | ICD-10-CM | POA: Insufficient documentation

## 2013-03-08 DIAGNOSIS — R5383 Other fatigue: Secondary | ICD-10-CM | POA: Insufficient documentation

## 2013-03-08 DIAGNOSIS — Z8742 Personal history of other diseases of the female genital tract: Secondary | ICD-10-CM | POA: Insufficient documentation

## 2013-03-08 DIAGNOSIS — R5381 Other malaise: Secondary | ICD-10-CM | POA: Insufficient documentation

## 2013-03-08 HISTORY — DX: Malignant neoplasm of uterus, part unspecified: C55

## 2013-03-08 LAB — COMPREHENSIVE METABOLIC PANEL
ALT: 15 U/L (ref 0–35)
AST: 17 U/L (ref 0–37)
Alkaline Phosphatase: 132 U/L — ABNORMAL HIGH (ref 39–117)
CO2: 24 mEq/L (ref 19–32)
Chloride: 97 mEq/L (ref 96–112)
GFR calc Af Amer: >90 mL/min (ref >90)
GFR calc non Af Amer: >90 mL/min (ref >90)
Glucose, Bld: 118 mg/dL — ABNORMAL HIGH (ref 70–99)
Potassium: 3.5 mEq/L (ref 3.5–5.1)
Sodium: 135 mEq/L (ref 135–145)
Total Bilirubin: 0.4 mg/dL (ref 0.3–1.2)

## 2013-03-08 LAB — CBC
HCT: 44.2 % (ref 36.0–46.0)
MCHC: 33.7 g/dL (ref 30.0–36.0)
MCV: 87.9 fL (ref 78.0–100.0)
RBC: 5.03 MIL/uL (ref 3.87–5.11)
RDW: 12.7 % (ref 11.5–15.5)
WBC: 8.6 10*3/uL (ref 4.0–10.5)

## 2013-03-08 LAB — APTT: aPTT: 43 seconds — ABNORMAL HIGH (ref 24–37)

## 2013-03-08 LAB — DIFFERENTIAL
Basophils Absolute: 0 10*3/uL (ref 0.0–0.1)
Eosinophils Relative: 5 % (ref 0–5)
Lymphocytes Relative: 28 % (ref 12–46)
Lymphs Abs: 2.5 10*3/uL (ref 0.7–4.0)
Monocytes Absolute: 0.5 10*3/uL (ref 0.1–1.0)
Neutro Abs: 5.2 10*3/uL (ref 1.7–7.7)

## 2013-03-08 LAB — URINALYSIS, ROUTINE W REFLEX MICROSCOPIC
Bilirubin Urine: NEGATIVE
Glucose, UA: NEGATIVE mg/dL
Hgb urine dipstick: NEGATIVE
Ketones, ur: NEGATIVE mg/dL
Leukocytes, UA: NEGATIVE
Protein, ur: NEGATIVE mg/dL
Urobilinogen, UA: 0.2 mg/dL (ref 0.0–1.0)

## 2013-03-08 NOTE — ED Provider Notes (Signed)
  This was a shared visit with a mid-level provided (NP or PA).  Throughout the patient's course I was available for consultation/collaboration.  I saw the ECG (if appropriate), relevant labs and studies - I agree with the interpretation.  On my exam the patient was in no distress.  She was in no distress, with no ongoing symptoms.  After multiple conversations about the need for medication compliance for stroke prophylaxis, as well as additional evaluation and management as an outpatient, she was appropriate for discharge.      Gerhard Munch, MD 03/08/13 1622

## 2013-03-08 NOTE — ED Notes (Signed)
Pt states was irritable and felt like bp was elevated yesterday; face flushed yesterday; pt states around 2230-2300 last night developed facial numbness right sided x 1 hour; tried to rest and woke up this morning around 330 with her heart racing; has history of atrial fib and felt like had flaired up; c/o back pain and left arm numbness which has resolved since 330am; face continues to have some numbness--warmth;  Equal facial grimacing/eye movement; no arm or leg weakness/deficit

## 2013-03-08 NOTE — ED Provider Notes (Signed)
CSN: [id number]     Arrival date & time 03/08/13  0439 History   First MD Initiated Contact with Patient 03/08/13 0601     Chief Complaint  Patient presents with  . Numbness   (Consider location/radiation/quality/duration/timing/severity/associated sxs/prior Treatment) HPI Comments: Patient with history of paroxysmal atrial fibrillation (inconsistent anticoagulation), hypertension, high cholesterol, borderline diabetes -- presents with multiple complaints. Patient states that she felt like her blood pressure was high yesterday because her face was flushed, she had a headache, and was irritable. She was unable to check her blood pressure. Upon returning home from work last night she felt better. However, at approximately 10:30pm the patient had acute onset of right facial numbness without any sensation. Symptoms lasted for approximately one hour before resolving spontaneously. She did not have any facial droop, slurred speech, weakness in her arms or her legs, difficulty walking at that time. Patient states that at about 3:30 this morning she had a 10 minute episode of left arm numbness and lower back pain which again resolved. Around this time she also had a five-minute episode of palpitations consistent with her previous A. fib. Patient has previously been on anticoagulation for atrial fibrillation. Over the past few months she had to discontinue due to vaginal bleeding from newly diagnosed endometrial cancer. She restarted approximately 1 week ago, but ran out for a few days, and took one pill yesterday. Her symptoms are currently controlled. She is concerned about stroke. Aggravating factors: none. Alleviating factors: none.    The history is provided by the patient.    Past Medical History  Diagnosis Date  . Paroxysmal a-fib   . Morbid obesity   . HTN (hypertension)   . Asthma 01/21/2013    Dr. Sherene Sires  . Endometrial cancer 01/21/2013    Dr. Clifton James, stage I-10/14  . Uterine cancer    Past  Surgical History  Procedure Laterality Date  . Nasal sinus surgery    . Appendectomy    . Dilation and curettage of uterus    . Ablation     Family History  Problem Relation Age of Onset  . Heart attack Father   . Hypertension Father   . Diabetes Father    History  Substance Use Topics  . Smoking status: Never Smoker   . Smokeless tobacco: Not on file  . Alcohol Use: No   OB History   Grav Para Term Preterm Abortions TAB SAB Ect Mult Living                 Review of Systems  Constitutional: Positive for fatigue. Negative for fever.  HENT: Negative for congestion, dental problem, rhinorrhea and sinus pressure.   Eyes: Negative for photophobia, discharge, redness and visual disturbance.  Respiratory: Negative for shortness of breath.   Cardiovascular: Negative for chest pain.  Gastrointestinal: Negative for nausea and vomiting.  Musculoskeletal: Positive for back pain. Negative for gait problem, neck pain and neck stiffness.  Skin: Negative for rash.  Neurological: Positive for numbness and headaches. Negative for dizziness, tremors, seizures, syncope, facial asymmetry, speech difficulty, weakness and light-headedness.  Psychiatric/Behavioral: Negative for confusion.    Allergies  Prednisone and Sulfa antibiotics  Home Medications   Current Outpatient Rx  Name  Route  Sig  Dispense  Refill  . albuterol (PROVENTIL HFA;VENTOLIN HFA) 108 (90 BASE) MCG/ACT inhaler   Inhalation   Inhale 2 puffs into the lungs every 6 (six) hours as needed for wheezing.         Marland Kitchen  budesonide-formoterol (SYMBICORT) 160-4.5 MCG/ACT inhaler   Inhalation   Inhale 2 puffs into the lungs 2 (two) times daily.         Marland Kitchen diltiazem (TIAZAC) 360 MG 24 hr capsule   Oral   Take 360 mg by mouth daily.         . furosemide (LASIX) 40 MG tablet   Oral   Take 40 mg by mouth.         Marland Kitchen lisinopril (PRINIVIL,ZESTRIL) 10 MG tablet   Oral   Take 10 mg by mouth daily.         . metoprolol  succinate (TOPROL-XL) 25 MG 24 hr tablet   Oral   Take 12.5 mg by mouth 2 (two) times daily.          . rivaroxaban (XARELTO) 10 MG TABS tablet   Oral   Take 10 mg by mouth daily.          BP 118/57  Pulse 65  Temp(Src) 97.9 F (36.6 C) (Oral)  Resp 18  Ht 5\' 5"  (1.651 m)  Wt 369 lb (167.377 kg)  BMI 61.40 kg/m2  SpO2 98% Physical Exam  Nursing note and vitals reviewed. Constitutional: She is oriented to person, place, and time. She appears well-developed and well-nourished.  Morbidly obese  HENT:  Head: Normocephalic and atraumatic.  Right Ear: Tympanic membrane, external ear and ear canal normal.  Left Ear: Tympanic membrane, external ear and ear canal normal.  Nose: Nose normal.  Mouth/Throat: Uvula is midline, oropharynx is clear and moist and mucous membranes are normal.  Eyes: Conjunctivae, EOM and lids are normal. Pupils are equal, round, and reactive to light. Right eye exhibits no nystagmus. Left eye exhibits no nystagmus.  Neck: Normal range of motion. Neck supple.  No carotid bruits appreciated.   Cardiovascular: Normal rate, regular rhythm and normal heart sounds.   No murmur heard. Pulmonary/Chest: Effort normal and breath sounds normal. No respiratory distress. She has no wheezes. She has no rales.  Abdominal: Soft. There is no tenderness. There is no rebound and no guarding.  Musculoskeletal: She exhibits no edema and no tenderness.       Cervical back: She exhibits normal range of motion, no tenderness and no bony tenderness.  Neurological: She is alert and oriented to person, place, and time. She has normal strength and normal reflexes. No cranial nerve deficit or sensory deficit. She displays a negative Romberg sign. Coordination and gait normal. GCS eye subscore is 4. GCS verbal subscore is 5. GCS motor subscore is 6.  Skin: Skin is warm and dry.  Psychiatric: She has a normal mood and affect.    ED Course  Procedures (including critical care  time) Labs Review Labs Reviewed - No data to display Imaging Review No results found.  EKG Interpretation    Date/Time:  Tuesday March 08 2013 04:53:34 EST Ventricular Rate:  60 PR Interval:  175 QRS Duration: 84 QT Interval:  525 QTC Calculation: 525 R Axis:   27 Text Interpretation:  Sinus rhythm Low voltage, extremity and precordial leads Prolonged QT interval Since last tracing rate slower Confirmed by KNAPP  MD-I, IVA ([id number]) on 03/08/2013 5:29:13 AM           6:33 AM Patient seen and examined. Work-up initiated.   Vital signs reviewed and are as follows: Filed Vitals:   03/08/13 0528  BP:   Pulse:   Temp: 97.9 F (36.6 C)  Resp:   BP 118/57  Pulse  65  Temp(Src) 97.9 F (36.6 C) (Oral)  Resp 18  Ht 5\' 5"  (1.651 m)  Wt 369 lb (167.377 kg)  BMI 61.40 kg/m2  SpO2 98%  8:57 AM Pt d/w and seen by Dr. Jeraldine Loots. Patient would prefer to f/u as outpatient.   Patient has Xarelto ready for pickup at pharmacy. She will get this today.   She will f/u with her PCP/cardiologist this week.   Patient counseled to return IMMEDIATELY if they have weakness in their arms or legs, slurred speech, trouble walking or talking, confusion, trouble with their balance, or if they have any other concerns. Patient verbalizes understanding and agrees with plan. She understands tPA is option if she comes back right away   MDM   1. Paresthesia    Patient with various sensory symptoms, all completely resolved. She has risk factors for stroke. She is on anticoagulation but not entirely compliant. She wants to go home. She is moderate risk and has reliable follow-up. She seems like she is reliable to return if symptoms recur. She will follow-up and continue Xarelto. Appropriate return instructions given. Normal neuro exam now. She appears well.     Renne Crigler, PA-C 03/08/13 [id number]

## 2013-03-08 NOTE — Telephone Encounter (Signed)
New Problem:  Pt states she was seen in the hospital today and wanted to make Dr. Anne Fu aware. Pt states she has an appt on 12/4 and will discuss the further w/ the doctor.

## 2013-03-09 ENCOUNTER — Other Ambulatory Visit: Payer: BC Managed Care – PPO

## 2013-03-10 ENCOUNTER — Ambulatory Visit: Payer: BC Managed Care – PPO | Admitting: Cardiology

## 2013-03-10 ENCOUNTER — Other Ambulatory Visit: Payer: BC Managed Care – PPO

## 2013-03-14 NOTE — Telephone Encounter (Signed)
Patient wanted Dr. Anne Fu to know she was seen in hospital.

## 2013-04-07 DIAGNOSIS — Z8542 Personal history of malignant neoplasm of other parts of uterus: Secondary | ICD-10-CM | POA: Insufficient documentation

## 2013-04-07 DIAGNOSIS — C541 Malignant neoplasm of endometrium: Secondary | ICD-10-CM | POA: Insufficient documentation

## 2013-04-27 ENCOUNTER — Other Ambulatory Visit: Payer: Self-pay | Admitting: *Deleted

## 2013-04-27 MED ORDER — DILTIAZEM HCL ER BEADS 360 MG PO CP24: 360.0000 mg | ORAL_CAPSULE | Freq: Every day | ORAL | Status: DC

## 2013-04-29 ENCOUNTER — Telehealth: Payer: Self-pay | Admitting: Cardiology

## 2013-04-29 NOTE — Telephone Encounter (Signed)
New message  Patient is taking Taxtia for BP, she is unable to afford it. She would like something different that is on the $ 4.00 program at [facility]. Please call and advise.

## 2013-05-02 ENCOUNTER — Telehealth: Payer: Self-pay

## 2013-05-02 NOTE — Telephone Encounter (Signed)
Verapamil 120 mg (immediate release) is on [facility] list, but would need to take it tid ($10 for 90 pills - 30 day supply) to be eqivalent to her diltiazem.  Since she has Afib I figured you might want to continue a rate control agent.  If you think the metoprolol will be sufficient, you could try and change diltiazem to amlodipine 5 mg daily.  This isn't on the $4 list at [facility], but isn't expensive.  Another option could be to d/c diltiazem and increase both metoprolol and lisinopril since both are relatively low dose.

## 2013-05-02 NOTE — Telephone Encounter (Signed)
Alexis [name] please review.

## 2013-05-03 NOTE — Telephone Encounter (Signed)
Thank you Ysidro Evert for reviewing chart and pharmacy options. Please discontinue diltiazem and increase metoprolol, please prescribe metoprolol tartrate, to 50 mg twice daily. Please have her come in and see APP in one week after medication change.

## 2013-05-04 ENCOUNTER — Encounter: Payer: Self-pay | Admitting: Cardiology

## 2013-05-04 NOTE — Telephone Encounter (Signed)
no

## 2013-05-04 NOTE — Telephone Encounter (Signed)
Alexis [name], I know that metop tartrate is on [facility] plan, is succinate?

## 2013-05-04 NOTE — Telephone Encounter (Signed)
Please provide an alternate Rx  Due to cost.

## 2013-05-04 NOTE — Telephone Encounter (Signed)
D/c diltiazem and increase her metoprolol tartrate to 25 mg bid.  Have her come in for appointment with APP 1 week after changing medication.  Thanks.

## 2013-05-04 NOTE — Telephone Encounter (Signed)
No succinate is not

## 2013-05-04 NOTE — Telephone Encounter (Signed)
Contacted patient, patient line disconnected.

## 2013-05-05 ENCOUNTER — Telehealth: Payer: Self-pay | Admitting: Cardiology

## 2013-05-05 ENCOUNTER — Encounter: Payer: Self-pay | Admitting: Cardiology

## 2013-05-05 MED ORDER — METOPROLOL TARTRATE 50 MG PO TABS: 50.0000 mg | ORAL_TABLET | Freq: Two times a day (BID) | ORAL | Status: DC

## 2013-05-05 NOTE — Telephone Encounter (Signed)
Error

## 2013-05-05 NOTE — Telephone Encounter (Signed)
Sent a letter to patient advising of medication changes and mailed information to patient with Rx

## 2013-05-05 NOTE — Telephone Encounter (Signed)
Contacted patient , line disconnected , sending letter to patient advising of medication change. Stop Diltiazem  And Increase Metoprolol Tartrate to 50 mg twice daily. Written Rx sent with Letter. Change made due to cost .

## 2013-05-11 ENCOUNTER — Other Ambulatory Visit: Payer: Self-pay | Admitting: Family Medicine

## 2013-05-11 DIAGNOSIS — N63 Unspecified lump in unspecified breast: Secondary | ICD-10-CM

## 2013-05-17 ENCOUNTER — Other Ambulatory Visit: Payer: BC Managed Care – PPO

## 2013-06-17 ENCOUNTER — Other Ambulatory Visit: Payer: Self-pay | Admitting: *Deleted

## 2013-06-17 ENCOUNTER — Telehealth: Payer: Self-pay | Admitting: *Deleted

## 2013-06-17 ENCOUNTER — Other Ambulatory Visit: Payer: Self-pay

## 2013-06-17 MED ORDER — RIVAROXABAN 20 MG PO TABS: 20.0000 mg | ORAL_TABLET | Freq: Every day | ORAL | Status: DC

## 2013-06-17 MED ORDER — METOPROLOL TARTRATE 50 MG PO TABS: 50.0000 mg | ORAL_TABLET | Freq: Two times a day (BID) | ORAL | Status: DC

## 2013-06-17 NOTE — Telephone Encounter (Signed)
The chart has 10mg  xarelto, but I confirmed with the patient that she takes the 20mg .

## 2013-06-17 NOTE — Telephone Encounter (Signed)
Patient requests xarelto refill, but I am not sure if she is to be taking the 15mg  or the 20mg . Please advise. Thanks, MI

## 2013-06-20 MED ORDER — RIVAROXABAN 20 MG PO TABS: 20.0000 mg | ORAL_TABLET | Freq: Every day | ORAL | Status: DC

## 2013-07-25 ENCOUNTER — Other Ambulatory Visit: Payer: Self-pay

## 2013-07-25 ENCOUNTER — Telehealth: Payer: Self-pay

## 2013-07-25 MED ORDER — RIVAROXABAN 20 MG PO TABS: 20.0000 mg | ORAL_TABLET | Freq: Every day | ORAL | Status: DC

## 2013-07-25 NOTE — Telephone Encounter (Signed)
OK to go back on Diltiazem 360 QD and Metoprolol 25 mg BID.  Thanks for clarification.   Elta Guadeloupe

## 2013-07-25 NOTE — Telephone Encounter (Signed)
Patient was originally on Diltiazem 360 mg once daily with Metoprolol 25 BID , due to cost we increases her Metoprolol to 50 mg BID and discontinued the Diltiazem, patient would like to know if she could go back on Diltiazem 360 QD and Metoprolol 25 mg BID.

## 2013-07-25 NOTE — Telephone Encounter (Signed)
She is on metop 25 and diltiazem Orpah Cobb) now. Please call her to clarify. Thanks.

## 2013-07-26 ENCOUNTER — Telehealth: Payer: Self-pay

## 2013-07-26 NOTE — Telephone Encounter (Signed)
lmtco

## 2013-08-02 NOTE — Telephone Encounter (Signed)
Call patient no answer,  unable to leave voicemail

## 2013-08-03 ENCOUNTER — Other Ambulatory Visit: Payer: Self-pay

## 2013-08-03 MED ORDER — METOPROLOL TARTRATE 25 MG PO TABS: 50.0000 mg | ORAL_TABLET | Freq: Two times a day (BID) | ORAL | Status: DC

## 2013-08-03 MED ORDER — DILTIAZEM HCL ER COATED BEADS 360 MG PO CP24: 360.0000 mg | ORAL_CAPSULE | Freq: Every day | ORAL | Status: DC

## 2013-08-17 ENCOUNTER — Ambulatory Visit: Payer: BC Managed Care – PPO | Admitting: Cardiology

## 2013-08-19 ENCOUNTER — Ambulatory Visit (INDEPENDENT_AMBULATORY_CARE_PROVIDER_SITE_OTHER): Payer: BC Managed Care – PPO | Admitting: Cardiology

## 2013-08-19 ENCOUNTER — Encounter: Payer: Self-pay | Admitting: Cardiology

## 2013-08-19 VITALS — BP 114/72 | HR 58 | Ht 65.0 in | Wt 359.0 lb

## 2013-08-19 DIAGNOSIS — F3289 Other specified depressive episodes: Secondary | ICD-10-CM

## 2013-08-19 DIAGNOSIS — F32A Depression, unspecified: Secondary | ICD-10-CM

## 2013-08-19 DIAGNOSIS — F329 Major depressive disorder, single episode, unspecified: Secondary | ICD-10-CM

## 2013-08-19 DIAGNOSIS — I10 Essential (primary) hypertension: Secondary | ICD-10-CM

## 2013-08-19 DIAGNOSIS — I4891 Unspecified atrial fibrillation: Secondary | ICD-10-CM

## 2013-08-19 DIAGNOSIS — I480 Paroxysmal atrial fibrillation: Secondary | ICD-10-CM

## 2013-08-19 NOTE — Progress Notes (Signed)
[name]. [address], [place]  [address] Phone: [phone] Fax:  [phone]  Date:  08/19/2013   ID:  Alexis [name], DOB [birth year], MRN [id number]  PCP:  Alexis Coma, MD   History of Present Illness: Alexis [name] is a 58 y.o. female with atrial fibrillation, previously hospitalized in February of 2012 due to A. fib with RVR, history of noncompliance here for followup. She did not wish to wear a monitor at previous visit to see she is having any further episodes of atrial fibrillation. Had sensation of palpitations or afib overnight. The next day was gone. Last time lasted about 3 days. Felt scarey. Took it easy. Seems like there is a correlation with GI system. Woke up Sat am and right after eating breakfast went into it.   Fatigue, depression, missing work. Feels like blood surging to head. At night when lay down will feel this head surge. Has to sleep. Blood work normal. Getting ready to undergo sleep apnea.     Wt Readings from Last 3 Encounters:  08/19/13 359 lb (162.841 kg)  03/08/13 369 lb (167.377 kg)  01/21/13 365 lb (165.563 kg)     Past Medical History  Diagnosis Date  . Paroxysmal a-fib   . Morbid obesity   . HTN (hypertension)   . Asthma 01/21/2013    Dr. Melvyn Novas  . Endometrial cancer 01/21/2013    Dr. Polly Cobia, stage I-10/14  . Uterine cancer     Past Surgical History  Procedure Laterality Date  . Nasal sinus surgery    . Appendectomy    . Dilation and curettage of uterus    . Ablation      Current Outpatient Prescriptions  Medication Sig Dispense Refill  . albuterol (PROVENTIL HFA;VENTOLIN HFA) 108 (90 BASE) MCG/ACT inhaler Inhale 2 puffs into the lungs every 6 (six) hours as needed for wheezing.      . budesonide-formoterol (SYMBICORT) 160-4.5 MCG/ACT inhaler Inhale 2 puffs into the lungs 2 (two) times daily.      Marland Kitchen diltiazem (CARDIZEM CD) 360 MG 24 hr capsule Take 1 capsule (360 mg total) by mouth daily.  30 capsule  0  .  diltiazem (TIAZAC) 360 MG 24 hr capsule       . furosemide (LASIX) 40 MG tablet Take 40 mg by mouth.      Marland Kitchen lisinopril (PRINIVIL,ZESTRIL) 10 MG tablet Take 10 mg by mouth daily.      . metoprolol (LOPRESSOR) 25 MG tablet Take 2 tablets (50 mg total) by mouth 2 (two) times daily.  60 tablet  0  . rivaroxaban (XARELTO) 20 MG TABS tablet Take 1 tablet (20 mg total) by mouth daily with supper.  30 tablet  0  . sertraline (ZOLOFT) 50 MG tablet        No current facility-administered medications for this visit.    Allergies:    Allergies  Allergen Reactions  . Prednisone Other (See Comments)    Raises her blood pressure  . Sulfa Antibiotics Nausea And Vomiting    Social History:  The patient  reports that she has never smoked. She does not have any smokeless tobacco history on file. She reports that she does not drink alcohol.   Family History  Problem Relation Age of Onset  . Heart attack Father   . Hypertension Father   . Diabetes Father     ROS:  Please see the history of present illness.   Daytime somnolence.  All other systems reviewed and negative.   PHYSICAL EXAM: VS:  BP 114/72  Pulse 58  Ht 5\' 5"  (1.651 m)  Wt 359 lb (162.841 kg)  BMI 59.74 kg/m2 Well nourished, well developed, in no acute distress HEENT: normal, [place]/AT, EOMI Neck: no JVD, normal carotid upstroke, no bruit Cardiac:  normal S1, S2; RRR; no murmur Lungs:  clear to auscultation bilaterally, no wheezing, rhonchi or rales Abd: soft, nontender, no hepatomegaly, no bruits Ext: no edema, 2+ distal pulses Skin: warm and dry GU: deferred Neuro: no focal abnormalities noted, AAO x 3  EKG:  None today   ASSESSMENT AND PLAN:  1. PAF - currently well controlled. She is battling with significant depression. Daytime somnolence. She is going to have a sleep study. In the meantime, she would like to go back on her metoprolol/.that because of side effects of possible drowsiness. I'm okay with this. Continue with  diltiazem. 2. Chronic anticoagulation-continue with Xarelto. 3. Ear rushing sensation when laying down. Try lipoflavonoid 4. Morbid obesity-continue to advocate weight loss. Challenging with depression currently. 5. 6 month followup  Signed, Candee Furbish, MD [facility]  08/19/2013 3:20 PM

## 2013-08-19 NOTE — Patient Instructions (Signed)
DECREASE YOUR METOPROLOL TO 1/2 TWICE A DAY FOR 1 WEEK AND THEN STOP  Your physician wants you to follow-up in: 6 MONTHS  You will receive a reminder letter in the mail two months in advance. If you don't receive a letter, please call our office to schedule the follow-up appointment.

## 2013-08-23 ENCOUNTER — Institutional Professional Consult (permissible substitution): Payer: BC Managed Care – PPO | Admitting: Internal Medicine

## 2013-08-24 ENCOUNTER — Institutional Professional Consult (permissible substitution): Payer: BC Managed Care – PPO | Admitting: Internal Medicine

## 2013-08-24 ENCOUNTER — Ambulatory Visit (INDEPENDENT_AMBULATORY_CARE_PROVIDER_SITE_OTHER): Payer: BC Managed Care – PPO | Admitting: Internal Medicine

## 2013-08-24 ENCOUNTER — Encounter: Payer: Self-pay | Admitting: Internal Medicine

## 2013-08-24 VITALS — BP 112/64 | HR 58 | Temp 97.9°F | Ht 64.0 in | Wt 361.0 lb

## 2013-08-24 DIAGNOSIS — G4733 Obstructive sleep apnea (adult) (pediatric): Secondary | ICD-10-CM

## 2013-08-24 DIAGNOSIS — J45909 Unspecified asthma, uncomplicated: Secondary | ICD-10-CM

## 2013-08-24 DIAGNOSIS — I10 Essential (primary) hypertension: Secondary | ICD-10-CM

## 2013-08-24 DIAGNOSIS — G4710 Hypersomnia, unspecified: Secondary | ICD-10-CM | POA: Insufficient documentation

## 2013-08-24 MED ORDER — TELMISARTAN 80 MG PO TABS: 80.0000 mg | ORAL_TABLET | Freq: Every day | ORAL | Status: DC

## 2013-08-24 NOTE — Assessment & Plan Note (Signed)
Her symptoms of worse cough/ congestion s purulent mucus may be due to environmental smoke or acei or both  rec avoid both if possible (see HBP)

## 2013-08-24 NOTE — Assessment & Plan Note (Signed)
-   poor sleep hygiene reviewed 08/24/2013  - Sleep study scheduled for one month p change off acei to ARB

## 2013-08-24 NOTE — Progress Notes (Signed)
Subjective:    Patient ID: Alexis [name], female    DOB: 09/16/1955  MRN: [id number]  HPI  15 yowf with ovarian ca dx stage 05 Jan 2013 and since then having trouble with daytime fatigue and drowsiness so referred 08/24/2013 to pulmonary clinic for ?OSA  Prev seen 2008 for asthma: DATE:05/25/2006 DOB: 10-23-1955  HISTORY: A 58 year old white female with a history of difficult to  control asthma. Last seen here on April 13, 2006 with the  recommendation that she maintain Symbicort at 160/4.5 two puffs b.i.d.  Take empiric Protonix at 40 mg b.i.d. before meals, which she failed to  do, and try Singulair 10 mg q.p.m. She said that Singulair did  nothing for her, and stopped it after a couple of weeks but is  convinced that Symbicort is helping, and that she is using less  albuterol than normal. It turns out, however, that she is still using  albuterol 4 or 5 times a day. She states she does not typically wake up  at night and need it.  For full inventory of medications, please face sheet accommodated  May 25, 2006.  PHYSICAL EXAMINATION: She is a pleasant obese white female, in no acute  distress.  She had stable vital signs.  HEENT: Unremarkable. Oropharynx clear.  LUNG FIELDS: Clear bilaterally to auscultation and percussion. It was  done within 2 hours of her last albuterol use.  HEART: Regular rhythm without murmur, gallop or rub.  ABDOMEN: Soft and benign.  EXTREMITIES: Warm without calf tenderness, cyanosis, clubbing or edema.  Pulmonary function tests were reviewed from January 22, and indicate an  FEV1 of only 56% predicted with a ratio of 53% and a 15% improvement  after bronchodilators. rec gerd rx plus symbicort 160 2bid > all symptoms resolved once learned technique    08/24/2013 1st [facility] office visit/ Violet Cart   Off gerd rx/ on symbicort 160 2bid and ACEi now Chief Complaint  Patient presents with  . Advice Only    Old MW pt to reestablish care for  Asthma, sleep study.   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 Why wakes 8 am not refreshed funny in the head and feels drowsy 10- noon. Only drives 10 min and on trips gets drowsy but husband always drives her.   Also c/o worse cough and congestion and sorethroat x sev months attributes to second hand exp from another renter in same building but not really better when not in the building. No need for saba   No obvious other patterns in day to day or daytime variabilty or assoc   cp or chest tightness, subjective wheeze overt sinus or hb symptoms. No unusual exp hx or h/o childhood pna/ asthma or knowledge of premature birth.  Sleeping   without nocturnal  or early am exacerbation  of respiratory  c/o's or need for noct saba. Also denies any obvious fluctuation of symptoms with weather or environmental changes or other aggravating or alleviating factors except as outlined above   Current Medications, Allergies, Complete Past Medical History, Past Surgical History, Family History, and Social History were reviewed in [employer] record.           Review of Systems  Constitutional: Negative for fever and unexpected weight change.  HENT: Positive for congestion, sneezing and sore throat. Negative for dental problem, ear pain, nosebleeds, postnasal drip,  rhinorrhea, sinus pressure and trouble swallowing.   Eyes: Positive for itching. Negative for redness.  Respiratory: Positive for shortness of breath and wheezing. Negative for cough and chest tightness.   Cardiovascular: Negative for palpitations and leg swelling.  Gastrointestinal: Negative for nausea and vomiting.  Genitourinary: Negative for dysuria.  Musculoskeletal: Negative for joint swelling.  Skin: Negative for rash.  Neurological: Negative for headaches.  Hematological: Does not bruise/bleed easily.  Psychiatric/Behavioral: Negative  for dysphoric mood. The patient is not nervous/anxious.        Objective:   Physical Exam Wt Readings from Last 3 Encounters:  08/24/13 361 lb (163.749 kg)  08/19/13 359 lb (162.841 kg)  03/08/13 369 lb (167.377 kg)     Obese wf nad with occ throat clearing.   HEENT: nl dentition, turbinates, and orophanx with M III airway.  Nl external ear canals without cough reflex   NECK :  without JVD/Nodes/TM/ nl carotid upstrokes bilaterally   LUNGS: no acc muscle use, clear to A and P bilaterally without cough on insp or exp maneuvers   CV:  RRR  no s3 or murmur or increase in P2, no edema   ABD:  soft and nontender with nl excursion in the supine position. No bruits or organomegaly, bowel sounds nl  MS:  warm without deformities, calf tenderness, cyanosis or clubbing  SKIN: warm and dry without lesions    NEURO:  alert, approp, no deficits         Assessment & Plan:

## 2013-08-24 NOTE — Assessment & Plan Note (Signed)
ACE inhibitors are problematic in  pts with airway complaints because  even experienced pulmonologists can't always distinguish ace effects from copd/asthma.  By themselves they don't actually cause a problem, much like oxygen can't by itself start a fire, but they certainly serve as a powerful catalyst or enhancer for any "fire"  or inflammatory process in the upper airway, be it caused by an ET  tube or more commonly reflux (especially in the obese or pts with known GERD or who are on biphoshonates).    In the era of ARB near equivalency until we have a better handle on the reversibility of the airway problem, it just makes sense to avoid ACEI  entirely in the short run and then decide later, having established a level of airway control using a reasonable limited regimen, whether to add back ace but even then being very careful to observe the pt for worsening airway control and number of meds used/ needed to control symptoms.    Try micardis 40 mg daily x one month then continue if resp symptoms improve, if not ok to rechallenge with acei

## 2013-08-24 NOTE — Patient Instructions (Addendum)
Stop lisinopril  Start micardis 80 mg one half daily   You need to breath clean air to reduce your risk of asthma flares   Read for 30 min before bed nightly - if you do wake up no light  We will schedule you for a sleep study in one month

## 2013-08-31 ENCOUNTER — Other Ambulatory Visit: Payer: Self-pay | Admitting: Cardiology

## 2013-09-02 NOTE — Telephone Encounter (Signed)
Patient seen by Dr [name] since this telephone encounter started

## 2013-09-24 IMAGING — US US PELVIS COMPLETE
1 series · 13 of 25 positions shown · non-contrast
Comparison: 11/23/2009

CLINICAL DATA: Gas pain and flatulence.  Postmenopausal bleeding.



[Series 1: us pelvis complete · 13 of 30 slices shown]
[im 1/30]
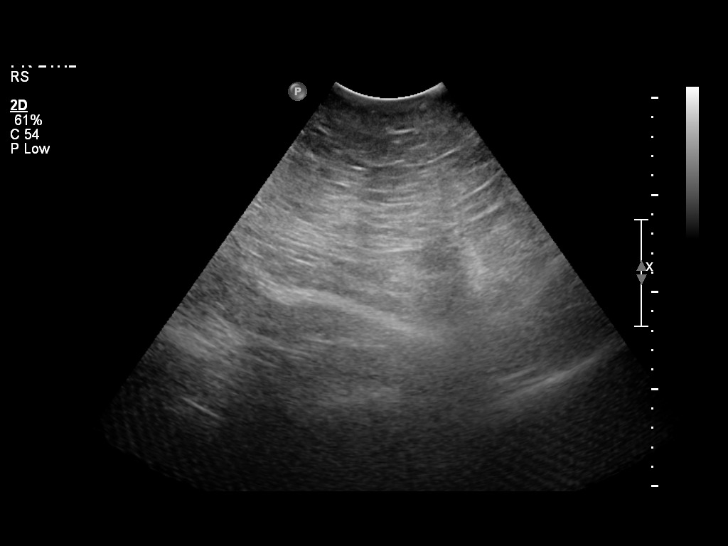
[im 3/30]
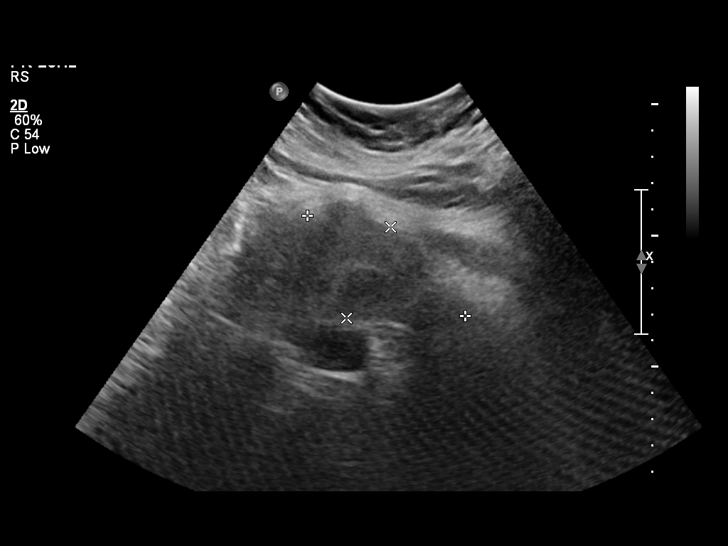
[im 5/30]
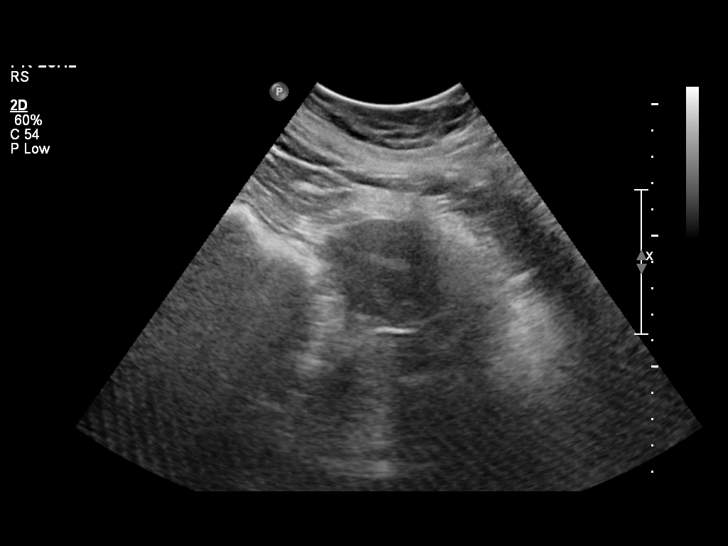
[im 8/30]
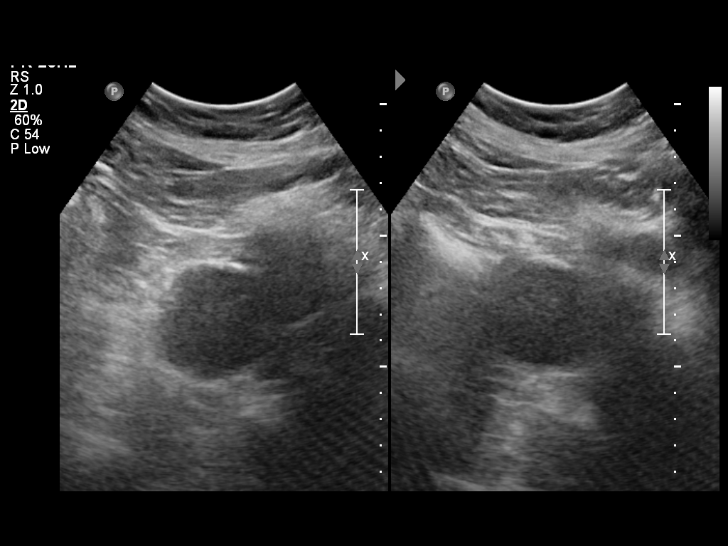
[im 10/30]
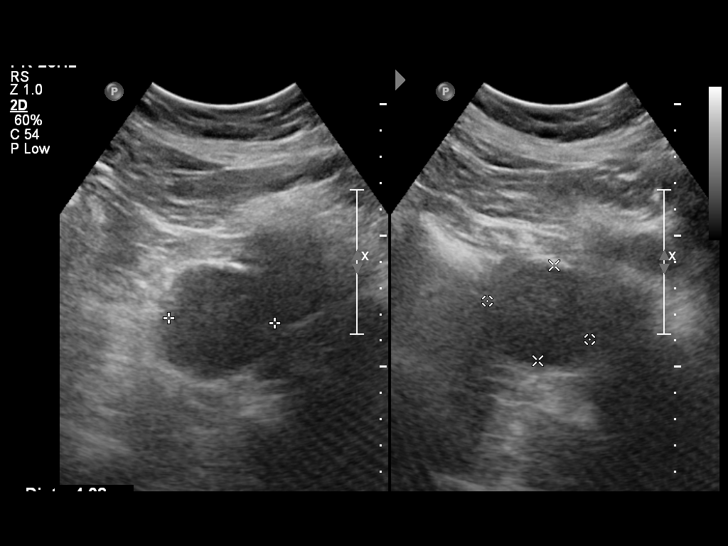
[im 13/30]
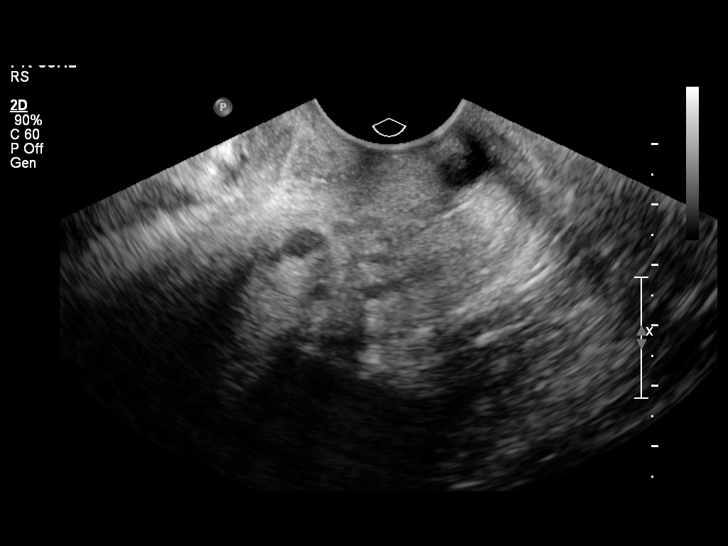
[im 15/30]
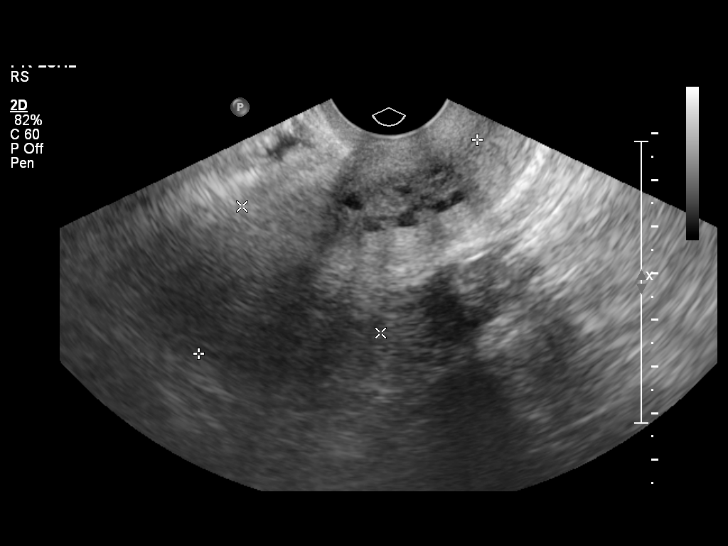
[im 17/30]
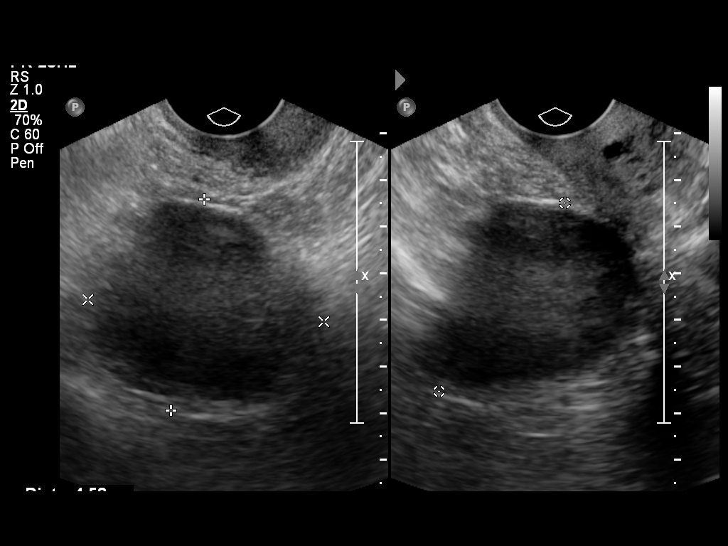
[im 20/30]
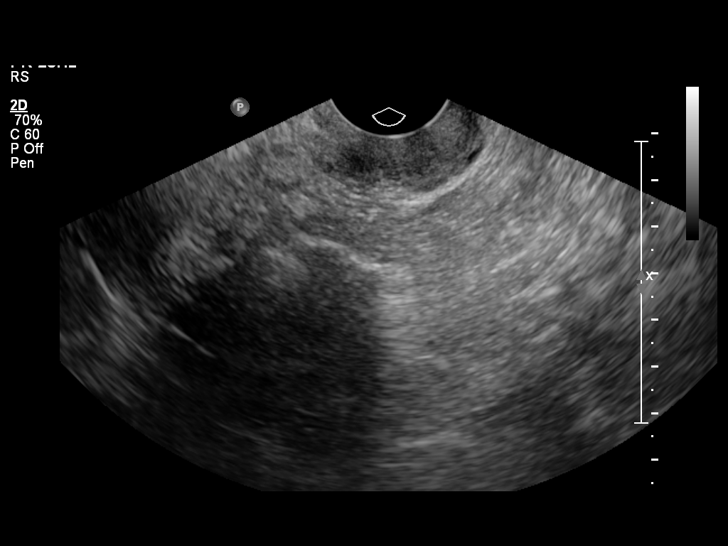
[im 22/30]
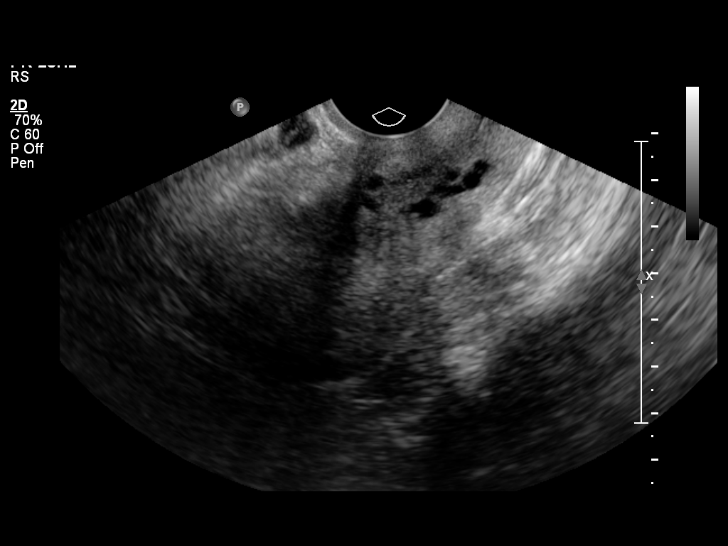
[im 25/30]
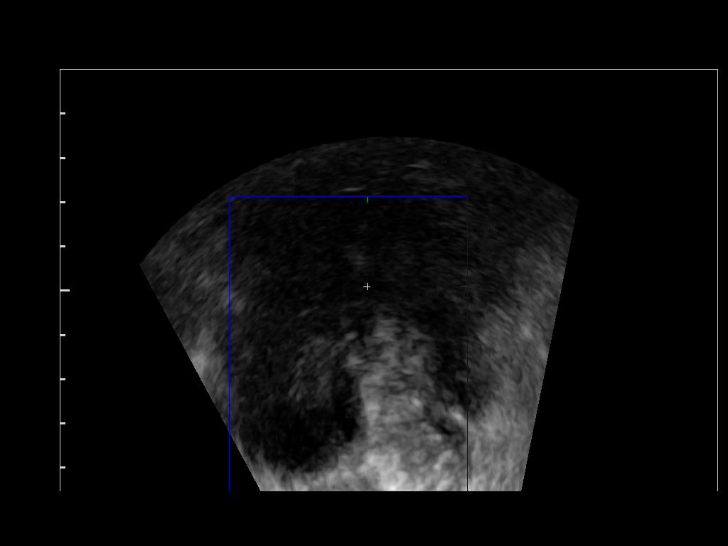
[im 27/30]
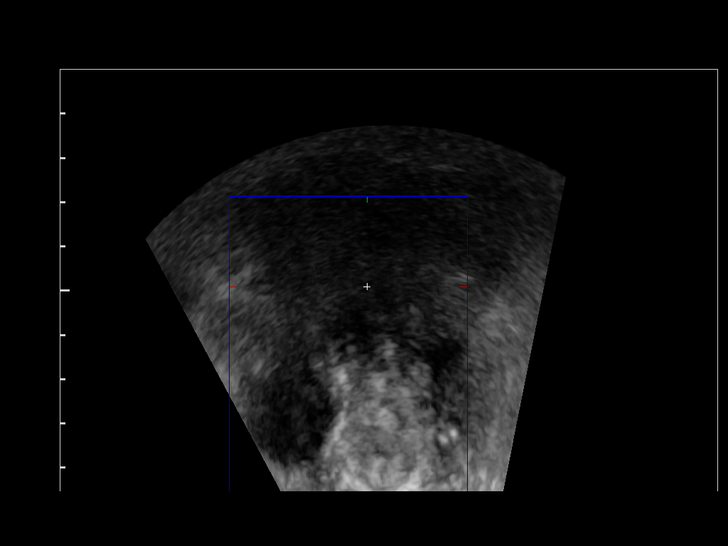
[im 30/30]
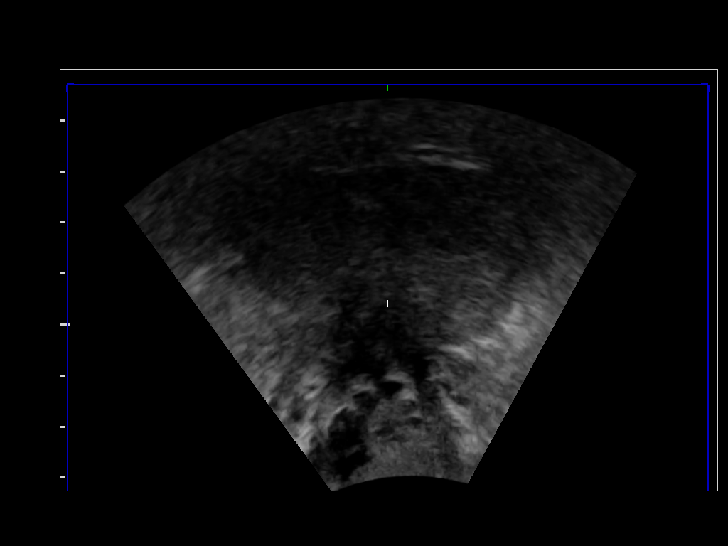

[13 of 25 positions shown; findings below may reference images not displayed]

FINDINGS: Uterus: The uterine myometrium and endometrium are poorly
visualized due to poor scanning parameters resulting from patient
body habitus both endovaginally and transabdominally.  The uterus
is anteverted and anteflexed and demonstrates a sagittal length of
approximately 7 cm, depth of 4 cm and width of 4.5 cm.  Evaluation
of the myometrium is suboptimal but no definite mural abnormalities
are identified

Endometrium: Is poorly visualized due to compromised scanning
parameters and incompletely evaluated

Right ovary:  Measures 4.5 x 5.1 x 4.9 cm and contains a complex
cystic lesion measuring 4.1 x 4.0 x 4.8 cm which contains diffuse
low level echoes.  This is unchanged in comparison with the prior
exam from 2233 and would correlate with an endometrioma.

Left ovary: Is not seen with confidence either transabdominally or
endovaginally.

Other findings: No pelvic fluid is seen
IMPRESSION: Markedly compromised evaluation due to patient body habitus
resulting in poor scanning parameters both transabdominally and
endovaginally.  No gross mural abnormality with a poorly resolved
myometrium.

Inadequately assessed endometrial lining.

Stable right ovarian endometrioma and non-visualized left ovary..

## 2013-10-04 ENCOUNTER — Ambulatory Visit (HOSPITAL_BASED_OUTPATIENT_CLINIC_OR_DEPARTMENT_OTHER): Payer: BC Managed Care – PPO | Attending: Internal Medicine

## 2013-11-02 ENCOUNTER — Ambulatory Visit (HOSPITAL_BASED_OUTPATIENT_CLINIC_OR_DEPARTMENT_OTHER): Payer: BC Managed Care – PPO | Attending: Internal Medicine | Admitting: Radiology

## 2013-11-02 VITALS — Ht 64.0 in | Wt 365.0 lb

## 2013-11-02 DIAGNOSIS — G4733 Obstructive sleep apnea (adult) (pediatric): Secondary | ICD-10-CM

## 2013-11-02 DIAGNOSIS — R000 Tachycardia, unspecified: Secondary | ICD-10-CM | POA: Insufficient documentation

## 2013-11-02 DIAGNOSIS — G4761 Periodic limb movement disorder: Secondary | ICD-10-CM | POA: Insufficient documentation

## 2013-11-10 ENCOUNTER — Encounter: Payer: Self-pay | Admitting: Internal Medicine

## 2013-11-10 DIAGNOSIS — G4710 Hypersomnia, unspecified: Secondary | ICD-10-CM

## 2013-11-10 DIAGNOSIS — G4730 Sleep apnea, unspecified: Secondary | ICD-10-CM

## 2013-11-10 NOTE — Sleep Study (Signed)
   NAME: Alexis [name] DATE OF BIRTH:  06-13-1955 MEDICAL RECORD NUMBER [id number]  LOCATION: [facility]  PHYSICIAN: Kathee Delton  DATE OF STUDY: 11/02/2013  SLEEP STUDY TYPE: Nocturnal Polysomnogram               REFERRING PHYSICIAN: Tanda Rockers, MD  INDICATION FOR STUDY: Hypersomnia with sleep apnea  EPWORTH SLEEPINESS SCORE:  8 HEIGHT: 5\' 4"  (162.6 cm)  WEIGHT: 365 lb (165.563 kg)    Body mass index is 62.62 kg/(m^2).  NECK SIZE: 15 in.  MEDICATIONS: Reviewed in sleep record  SLEEP ARCHITECTURE: The patient had a total sleep time of 211 minutes with very little slow-wave sleep and only 50 minutes of REM. Sleep onset latency was normal at 24 minutes, and REM onset was normal at 90 minutes. Sleep efficiency was poor at 57%.  RESPIRATORY DATA: The patient was found to have 2 apneas and 44 obstructive hypopneas, giving her an AHI of 13 events per hour. The events occurred in all body positions, but were very prominent during REM. Loud snoring was noted throughout. The patient did not meet split-night protocol secondary to her events occurring primarily after 1 AM.  OXYGEN DATA: There was oxygen desaturation as low as 85% with the patient's obstructive events  CARDIAC DATA: Mild tachycardia noted at times, but no clinically significant arrhythmias were seen  MOVEMENT/PARASOMNIA: Moderate numbers of periodic leg movements noted without significant sleep disruption. There were no abnormal behaviors.  IMPRESSION/ RECOMMENDATION:    1) mild obstructive sleep apnea/hypopnea syndrome, with an AHI of 13 events per hour and oxygen desaturation as low as 85%. Treatment for this degree of sleep apnea can include a trial of weight loss alone, upper airway surgery, dental appliance, and also CPAP. Clinical correlation is suggested.     [place], American Board of Sleep Medicine  ELECTRONICALLY SIGNED ON:  11/10/2013, 2:40 PM [place] PH: [phone]   FX: [phone] [place]

## 2013-12-06 ENCOUNTER — Telehealth: Payer: Self-pay | Admitting: Cardiology

## 2013-12-06 NOTE — Telephone Encounter (Signed)
Left message for pt to call back to discuss concerns.

## 2013-12-06 NOTE — Telephone Encounter (Signed)
°  Patient wants to be put back on BP meds. Please call and advise.

## 2013-12-07 MED ORDER — METOPROLOL TARTRATE 25 MG PO TABS: 25.0000 mg | ORAL_TABLET | Freq: Every day | ORAL | Status: DC

## 2013-12-07 NOTE — Telephone Encounter (Signed)
Notified pt that Dr. [name] advised to restart Metoprolol 25 mg daily and to increase Lisinopril to 20 mg daily.  Also made her an appointment to see Margaret Pyle on 9/16.  Will send Metoprolol Rx into [facility]. Advised to continue to monitor BP and to call if has any significant changes.

## 2013-12-07 NOTE — Telephone Encounter (Signed)
Patient is returning call, please call back.

## 2013-12-07 NOTE — Telephone Encounter (Signed)
That is fine to restart metoprolol 25 QD. I would also increase lisinopril to 20mg  QD from 10mg . Have her come in to see APP in 2 weeks.

## 2013-12-07 NOTE — Telephone Encounter (Signed)
Called stating that last week and the previous week her BP was 170/90; also had a headache which promoted her to take BP.  States she is in the middle of a move and under stress.  States that her Metoprolol was stopped in May at office visit. States she was taking Metoprolol 25 mg BID but would like to go back to taking Metoprolol 25 mg daily.  Advised would send Dr. [name] message for his recommendations.

## 2013-12-15 ENCOUNTER — Telehealth: Payer: Self-pay | Admitting: Cardiology

## 2013-12-15 NOTE — Telephone Encounter (Signed)
Patient is having trouble with high BP. It is 190/100 and she has a headache no other symptoms. She has taken her meds. Please call and advise.

## 2013-12-15 NOTE — Telephone Encounter (Signed)
Left message for pt to call back to discuss BP issues.

## 2013-12-21 ENCOUNTER — Ambulatory Visit (INDEPENDENT_AMBULATORY_CARE_PROVIDER_SITE_OTHER): Payer: BC Managed Care – PPO | Admitting: Physician Assistant

## 2013-12-21 ENCOUNTER — Encounter: Payer: Self-pay | Admitting: Physician Assistant

## 2013-12-21 VITALS — BP 98/68 | HR 49 | Ht 64.0 in | Wt 358.0 lb

## 2013-12-21 DIAGNOSIS — I4891 Unspecified atrial fibrillation: Secondary | ICD-10-CM

## 2013-12-21 DIAGNOSIS — C549 Malignant neoplasm of corpus uteri, unspecified: Secondary | ICD-10-CM

## 2013-12-21 DIAGNOSIS — C541 Malignant neoplasm of endometrium: Secondary | ICD-10-CM

## 2013-12-21 DIAGNOSIS — I480 Paroxysmal atrial fibrillation: Secondary | ICD-10-CM

## 2013-12-21 DIAGNOSIS — I10 Essential (primary) hypertension: Secondary | ICD-10-CM

## 2013-12-21 MED ORDER — METOPROLOL TARTRATE 25 MG PO TABS: 12.5000 mg | ORAL_TABLET | Freq: Two times a day (BID) | ORAL | Status: DC

## 2013-12-21 NOTE — Patient Instructions (Signed)
LAB WORK TODAY; BMET  DECREASE METOPROLOL TO 12.5 MG TWICE DAILY  FOLLOW UP WITH DR. [name] AS PLANNED

## 2013-12-21 NOTE — Progress Notes (Signed)
Cardiology Office Note    Date:  12/21/2013   ID:  Ebelin Dillehay, DOB 07-21-1955, MRN [id number]  PCP:  Lilian Coma, MD  Cardiologist:  Dr. Candee Furbish     History of Present Illness: Alexis [name] is a 58 y.o. female with a hx of PAF, HTN, diet controlled DM2, CKD, statin intol, asthma, obesity, endometrial CA.  Admitted in 2012 with AF with RVR.  She was initially placed on Pradaxa for [facility] but is now on Xarelto.  Last seen by Dr. Candee Furbish 08/2013.  Beta blocker was stopped in the setting significant depression.  She called in recently with high BP. Metoprolol was resumed and Lisinopril dose was increased.  She returns for FU.    She moved recently and admits to increased salt in her diet.  She did feel bad with higher BPs which were running 588-502 systolic at one point.  She feels better now.  Her breathing is stable.  No chest pain.  No syncope.  No orthopnea, PND.  She notes some LE edema that has improved recently with reducing her salt intake.  She is now feeling much better.  HR is slow on ECG today.  She denies dizziness.   Studies:  - Echo (1/07):  EF 55-65%, no RWMA  - Echo (2/12):  poor study, LVF appears to be preserved  - Nuclear (1/07):  No ischemia, EF 73%   Recent Labs/Images: 03/08/2013: ALT 15; Creatinine 0.79; Hemoglobin 14.9; Potassium 3.5    Wt Readings from Last 3 Encounters:  12/21/13 358 lb (162.388 kg)  11/02/13 365 lb (165.563 kg)  08/24/13 361 lb (163.749 kg)     Past Medical History  Diagnosis Date  . Paroxysmal a-fib   . Morbid obesity   . HTN (hypertension)   . Asthma 01/21/2013    Dr. Melvyn Novas  . Endometrial cancer 01/21/2013    Dr. Polly Cobia, stage I-10/14  . Uterine cancer     Current Outpatient Prescriptions  Medication Sig Dispense Refill  . acetaminophen (TYLENOL) 500 MG tablet Take 1,000 mg by mouth every 8 (eight) hours as needed.       Marland Kitchen albuterol (PROVENTIL HFA;VENTOLIN HFA) 108 (90 BASE) MCG/ACT inhaler Inhale 2 puffs into the  lungs every 6 (six) hours as needed for wheezing.      . budesonide-formoterol (SYMBICORT) 160-4.5 MCG/ACT inhaler Inhale 2 puffs into the lungs 2 (two) times daily.      Marland Kitchen buPROPion (WELLBUTRIN XL) 150 MG 24 hr tablet Take 150 mg by mouth daily. welbutrin      . diltiazem (TIAZAC) 360 MG 24 hr capsule TAKE ONE CAPSULE DAILY  30 capsule  5  . furosemide (LASIX) 40 MG tablet Take 40 mg by mouth.      Marland Kitchen lisinopril (PRINIVIL,ZESTRIL) 20 MG tablet Take 20 mg by mouth 2 (two) times daily.   90 tablet  3  . metoprolol tartrate (LOPRESSOR) 25 MG tablet Take 0.5 tablets (12.5 mg total) by mouth 2 (two) times daily.  30 tablet  11  . XARELTO 20 MG TABS tablet TAKE 1 TABLET (20 MG TOTAL) BY MOUTH DAILY WITH SUPPER.  30 tablet  5  . [DISCONTINUED] diltiazem (CARDIZEM CD) 360 MG 24 hr capsule Take 1 capsule (360 mg total) by mouth daily.  30 capsule  0   No current facility-administered medications for this visit.     Allergies:   Prednisone and Sulfa antibiotics   Social History:  The patient  reports that she has never  smoked. She has never used smokeless tobacco. She reports that she does not drink alcohol.   Family History:  The patient's family history includes Diabetes in her father; Heart attack in her father; Hyperlipidemia in an other family member; Hypertension in her father.   ROS:  Please see the history of present illness.   No bleeding.  She has a Mirena IUD and FU with oncology tomorrow.   All other systems reviewed and negative.   PHYSICAL EXAM: VS:  BP 98/68  Pulse 49  Ht 5\' 4"  (1.626 m)  Wt 358 lb (162.388 kg)  BMI 61.42 kg/m2 Well nourished, well developed, in no acute distress HEENT: normal Neck: no JVDat 90 degrees Cardiac:  normal S1, S2; RRR; no murmur Lungs:  clear to auscultation bilaterally, no wheezing, rhonchi or rales Abd: soft, nontender, no hepatomegaly Ext: no edema Skin: warm and dry Neuro:  CNs 2-12 intact, no focal abnormalities noted  EKG:  Sinus brady,  HR 49, low voltage     ASSESSMENT AND PLAN:  1.  Unspecified essential hypertension:  I suspect her BP was increased in response to a high salt load.  BP is now running a little low.  I will cut her Metoprolol down to 12.5 mg bid given slow HR as well.  She will continue low Na diet.  Check BMET today.  2.  Paroxysmal atrial fibrillation:  No recurrence.  She is tolerating Xarelto.  3.  Endometrial cancer:  FU with oncology.  Disposition:  FU with Dr. Candee Furbish as planned.    Signed, Versie Starks, MHS 12/21/2013 [facility] [address], [place], [place]  [address] Phone: [phone]; Fax: [phone]

## 2013-12-22 ENCOUNTER — Telehealth: Payer: Self-pay | Admitting: *Deleted

## 2013-12-22 LAB — BASIC METABOLIC PANEL
BUN: 12 mg/dL (ref 6–23)
CALCIUM: 8.6 mg/dL (ref 8.4–10.5)
CO2: 28 mEq/L (ref 19–32)
Chloride: 104 mEq/L (ref 96–112)
Creatinine, Ser: 1 mg/dL (ref 0.4–1.2)
GFR: 58.57 mL/min — AB (ref >60.00)
Glucose, Bld: 122 mg/dL — ABNORMAL HIGH (ref 70–99)
Potassium: 4.6 mEq/L (ref 3.5–5.1)
Sodium: 140 mEq/L (ref 135–145)

## 2013-12-22 NOTE — Telephone Encounter (Signed)
lmom labs ok 

## 2013-12-23 NOTE — Telephone Encounter (Signed)
Pt was seen by Richardson Dopp 9/16 - BP issues were addressed

## 2014-01-02 ENCOUNTER — Telehealth: Payer: Self-pay | Admitting: *Deleted

## 2014-01-02 ENCOUNTER — Other Ambulatory Visit: Payer: Self-pay | Admitting: *Deleted

## 2014-01-02 NOTE — Telephone Encounter (Signed)
Left message for pt to call back to discuss medications.  9/2 - Increased Lisinopril to 20 mg a day per Dr [name]. 9/16 Metoprolol was decreased to 12.5 mg twice a day per Richardson Dopp, PA Medication list states lisinopril is 20 mg BID?

## 2014-01-02 NOTE — Telephone Encounter (Signed)
Patient called for lisinopril refill. She stated that Dr [name] changed it to 10mg  bid. Her last office note with Richardson Dopp has that she is taking 20mg  bid. Please advise on correct dose. Thanks, MI

## 2014-01-03 ENCOUNTER — Telehealth: Payer: Self-pay | Admitting: *Deleted

## 2014-01-03 MED ORDER — LISINOPRIL 10 MG PO TABS: 10.0000 mg | ORAL_TABLET | Freq: Two times a day (BID) | ORAL | Status: DC

## 2014-01-03 NOTE — Telephone Encounter (Signed)
LMTCB

## 2014-01-03 NOTE — Telephone Encounter (Signed)
New message    patient calling stating C/O blood pressure 160/90. Pulse 50.  On last night took half metoprolol.   Patient stating everything she eat - will raise her blood pressure up.  Trying to real careful - sodium intake.

## 2014-01-03 NOTE — Telephone Encounter (Signed)
Patient called and she is afraid to take her other bp medicine due to very low heart rate.

## 2014-01-03 NOTE — Telephone Encounter (Signed)
Follow up  ° ° °Patient returning call back to nurse  °

## 2014-01-03 NOTE — Telephone Encounter (Signed)
Per telephone call from pt - she has been out of Lisinopril and took extra Metoprolol.  Her HR is between 45-50 bpm.  Her BP is elevated at 150/90.  She has been going to 2 different MDs to have HTN treated.  Instructed her this leads to confusion about her medications and treatment plan as we do not always receive records from her PCP to know what changes they have made.  Encouraged her to only use one MD/office to treat HTN.  She states understanding.  She reports her PCP increased her Metoprolol to 25 mg BID.  Her medication list will be corrected.  A new RX will be sent into pharmacy for Lisinopril 10 mg BID for a total of 20 mg a day.  Pt prefers to take BID as she feels it controls for BP better.

## 2014-01-03 NOTE — Telephone Encounter (Signed)
Please see other telephone note for this issue.

## 2014-01-10 ENCOUNTER — Telehealth: Payer: Self-pay | Admitting: Cardiology

## 2014-01-10 NOTE — Telephone Encounter (Signed)
F/u ° ° °Pt returning your call °

## 2014-01-10 NOTE — Telephone Encounter (Signed)
New message     Pt was seen recently.  Her medications were adjusted.  She believes her medication need to be adjusted.  Her bp is 160/90 and has been higher.  Please call

## 2014-01-10 NOTE — Telephone Encounter (Signed)
Spoke with pt who reports BP has been elevates at 160/90 today.  She has been taking her BP when she feels flush or has a H/A because she knows her BP is elevated then.  Requested pt take BP every day about 1 hour after taking her meds and keep a log of it.  She has been taking her medications Lisinopril 10 mg BID, Metoprolol 25 mg BID and Diltiazem 360 mg as instructed.  She is aware Dr [name] is not in the office this week. She will call with BP log next week if it continues to be elevated.

## 2014-01-10 NOTE — Telephone Encounter (Signed)
Left message on vm to call back about BP

## 2014-01-11 NOTE — Telephone Encounter (Signed)
Agree with plan 

## 2014-01-13 ENCOUNTER — Other Ambulatory Visit: Payer: Self-pay

## 2014-01-13 MED ORDER — METOPROLOL TARTRATE 25 MG PO TABS: 25.0000 mg | ORAL_TABLET | Freq: Two times a day (BID) | ORAL | Status: DC

## 2014-02-15 ENCOUNTER — Other Ambulatory Visit: Payer: Self-pay | Admitting: Cardiology

## 2014-02-20 ENCOUNTER — Other Ambulatory Visit: Payer: Self-pay | Admitting: Cardiology

## 2014-02-22 ENCOUNTER — Other Ambulatory Visit: Payer: Self-pay | Admitting: Cardiology

## 2014-03-04 ENCOUNTER — Other Ambulatory Visit: Payer: Self-pay | Admitting: Cardiology

## 2014-03-06 ENCOUNTER — Ambulatory Visit: Payer: BC Managed Care – PPO | Admitting: Cardiology

## 2014-03-15 ENCOUNTER — Other Ambulatory Visit: Payer: Self-pay | Admitting: Cardiology

## 2014-03-16 ENCOUNTER — Encounter: Payer: Self-pay | Admitting: Cardiology

## 2014-04-07 DIAGNOSIS — C55 Malignant neoplasm of uterus, part unspecified: Secondary | ICD-10-CM

## 2014-04-07 HISTORY — DX: Malignant neoplasm of uterus, part unspecified (CMS HCC): C55

## 2014-04-08 ENCOUNTER — Other Ambulatory Visit: Payer: Self-pay | Admitting: Cardiology

## 2014-04-12 ENCOUNTER — Other Ambulatory Visit: Payer: Self-pay | Admitting: *Deleted

## 2014-04-12 MED ORDER — RIVAROXABAN 20 MG PO TABS: ORAL_TABLET | ORAL | Status: DC

## 2014-04-17 ENCOUNTER — Other Ambulatory Visit: Payer: Self-pay | Admitting: *Deleted

## 2014-04-17 MED ORDER — DILTIAZEM HCL ER BEADS 360 MG PO CP24: 360.0000 mg | ORAL_CAPSULE | Freq: Every day | ORAL | Status: DC

## 2014-04-19 ENCOUNTER — Telehealth: Payer: Self-pay | Admitting: Cardiology

## 2014-04-19 NOTE — Telephone Encounter (Signed)
Returned patient's call. Patient's BPs have been fluctuating up and down. Lowest blood pressure was yesterday at 130/76, and the highest was last night at 180/100. Heart rate has been 58-60. Patient feels flushed and achy all over. Patient states, "My left arm has some soreness, but it might be muscular." Patient arm is reported cool to touch and not swollen.  Patient is taking medications as directed. Patient denies any chest pain at this time. Patient has had a slight headache all day. Patient also informed the office that her BP has been doing this since 04/08/2014, after returning from visiting with family for the holidays. Patient also was off her xarelto for 3 days over the weekend. Informed patient that message would be forward to Dr. [name] for further instructions.

## 2014-04-19 NOTE — Telephone Encounter (Signed)
New MEssage  Pt has earl;iest [facility] appt, but wanted to speak w/ Rn to see if she needed to be seen earlier due to her change in BP.  Pt c/o BP issue:  1. What are your last 5 BP readings?   1/13 @ 4:30     150/90  1/13 @ 2:30    170/100  1/13 morning- 148/90  2. Are you having any other symptoms (ex. Dizziness, headache, blurred vision, passed out)? Dull headache all the time, some dizziness, sore arm (maybe muscular) 3. What is your medication issue? n/a

## 2014-04-20 NOTE — Telephone Encounter (Signed)
Please have her come into [facility] clinic soon to adjust medications.  Candee Furbish, MD

## 2014-04-21 ENCOUNTER — Ambulatory Visit: Payer: Self-pay | Admitting: Nurse Practitioner

## 2014-04-21 NOTE — Telephone Encounter (Signed)
Left message for patient to call back  

## 2014-04-21 NOTE — Telephone Encounter (Signed)
Called patient again, her home number is the wrong number, use only mobile number. Patient need to know about her appointment.

## 2014-04-21 NOTE — Telephone Encounter (Signed)
Left message for patient to call back. If she calls back I have her an appointment with the Ignacia Bayley NP ([facility]) today 04/21/14 at 2:00 pm.

## 2014-04-21 NOTE — Telephone Encounter (Signed)
Called pt. She had been scheduled to see Lucillie Garfinkel at 2:00 but pt states she never received that message.  Cancelled app and noted pt was unaware of appointment.  Also notified Gerald Stabs why pt did not show.  Pt wants to come in next Wed since she will be in [place] (lives in [place]) but advised could not add her on flex schedule until Monday.  She will call back Mon to make an appointment for Wed. Advised her to continue to monitor her BP and bring recordings with her  She verbalizes understanding.

## 2014-05-11 ENCOUNTER — Ambulatory Visit: Payer: Self-pay | Admitting: Cardiology

## 2014-05-11 ENCOUNTER — Other Ambulatory Visit: Payer: Self-pay | Admitting: Cardiology

## 2014-05-22 ENCOUNTER — Ambulatory Visit: Payer: Self-pay | Admitting: Nurse Practitioner

## 2014-05-23 ENCOUNTER — Encounter: Payer: Self-pay | Admitting: Nurse Practitioner

## 2014-05-23 ENCOUNTER — Ambulatory Visit (INDEPENDENT_AMBULATORY_CARE_PROVIDER_SITE_OTHER): Payer: 59 | Admitting: Nurse Practitioner

## 2014-05-23 VITALS — BP 140/80 | HR 57 | Ht 64.0 in | Wt 338.4 lb

## 2014-05-23 DIAGNOSIS — I480 Paroxysmal atrial fibrillation: Secondary | ICD-10-CM

## 2014-05-23 LAB — CBC
HCT: 43.2 % (ref 36.0–46.0)
Hemoglobin: 14.8 g/dL (ref 12.0–15.0)
MCHC: 34.3 g/dL (ref 30.0–36.0)
MCV: 86.5 fl (ref 78.0–100.0)
Platelets: 287 10*3/uL (ref 150.0–400.0)
RBC: 4.99 Mil/uL (ref 3.87–5.11)
RDW: 12.7 % (ref 11.5–15.5)
WBC: 9 10*3/uL (ref 4.0–10.5)

## 2014-05-23 LAB — BASIC METABOLIC PANEL
BUN: 13 mg/dL (ref 6–23)
CO2: 31 mEq/L (ref 19–32)
Calcium: 9.3 mg/dL (ref 8.4–10.5)
Chloride: 102 mEq/L (ref 96–112)
Creatinine, Ser: 0.96 mg/dL (ref 0.40–1.20)
GFR: 63.43 mL/min (ref >60.00)
Glucose, Bld: 96 mg/dL (ref 70–99)
Potassium: 4.6 mEq/L (ref 3.5–5.1)
Sodium: 138 mEq/L (ref 135–145)

## 2014-05-23 LAB — TSH: TSH: 1.93 u[IU]/mL (ref 0.35–4.50)

## 2014-05-23 NOTE — Patient Instructions (Signed)
We will be checking the following labs today BMET, CBC and TSH  Stay on your current medicines  We will stay off the Diltiazem  See me in 3 months - bring your BP cuff to that visit.  Call the [place] office at [phone] if you have any questions, problems or concerns.

## 2014-05-23 NOTE — Progress Notes (Signed)
CARDIOLOGY OFFICE NOTE  Date:  05/23/2014    Alexis [name] Date of Birth: 02/15/1956 Medical Record #[id number]  PCP:  Lilian Coma, MD  Cardiologist:  [facility]   Chief Complaint  Patient presents with  . Hypertension    Work in visit - seen for Dr. [name]     History of Present Illness: Alexis [name] is a 59 y.o. female who presents today for a hx of PAF, HTN, diet controlled DM2, CKD, statin intol, asthma, obesity, and endometrial CA. Admitted in 2012 with AF with RVR. She was initially placed on Pradaxa for [facility] but is now on Xarelto.   Last seen by Dr. Candee Furbish 08/2013. Beta blocker was stopped in the setting significant depression. Saw Richardson Dopp, PA back in the fall - BP was up - probably using too much salt. Was back on her beta blocker - he tried to cut her metoprolol back due to bradycardia - however, she is back taking.  Comes in today. Here alone. Needs to get "a check up". History is a little conflicting. Says she has been having more issues with her BP. Has ran out of her Diltiazem over the last 3 weeks - not able to afford the 40 dollar copay. Then tells me that her BP has been doing fine. She uses a regular size cuff at home. She admits to profound depression. Now on Wellbutrin. She has lost about 20 pounds. Says her cancer is "under control" Apparently needs hysterectomy but has been told she is too high risk and will need to lose 100 pounds prior to consideration. Very sedentary. Just started fixing meals and cleaned a bathroom - first time in many months. No longer able to work due to depression.    Past Medical History  Diagnosis Date  . Paroxysmal a-fib   . Morbid obesity   . HTN (hypertension)   . Asthma 01/21/2013    Dr. Melvyn Novas  . Endometrial cancer 01/21/2013    Dr. Polly Cobia, stage I-10/14  . Uterine cancer     Past Surgical History  Procedure Laterality Date  . Nasal sinus surgery    . Appendectomy    . Dilation and curettage of uterus      . Ablation       Medications: Current Outpatient Prescriptions  Medication Sig Dispense Refill  . acetaminophen (TYLENOL) 500 MG tablet Take 1,000 mg by mouth every 8 (eight) hours as needed.     Marland Kitchen albuterol (PROVENTIL HFA;VENTOLIN HFA) 108 (90 BASE) MCG/ACT inhaler Inhale 2 puffs into the lungs every 6 (six) hours as needed for wheezing.    . budesonide-formoterol (SYMBICORT) 160-4.5 MCG/ACT inhaler Inhale 2 puffs into the lungs 2 (two) times daily.    Marland Kitchen buPROPion (WELLBUTRIN XL) 150 MG 24 hr tablet Take 150 mg by mouth daily. welbutrin    . furosemide (LASIX) 40 MG tablet Take 40 mg by mouth.    Marland Kitchen lisinopril (PRINIVIL,ZESTRIL) 10 MG tablet Take 1 tablet (10 mg total) by mouth 2 (two) times daily. 60 tablet 11  . metoprolol tartrate (LOPRESSOR) 25 MG tablet Take 1 tablet (25 mg total) by mouth 2 (two) times daily. 60 tablet 3  . XARELTO 20 MG TABS tablet TAKE 1 TABLET EVERY DAY WITH SUPPER 15 tablet 0   No current facility-administered medications for this visit.    Allergies: Allergies  Allergen Reactions  . Prednisone Other (See Comments)    Raises her blood pressure  . Sulfa Antibiotics Nausea And Vomiting  Social History: The patient  reports that she has never smoked. She has never used smokeless tobacco. She reports that she does not drink alcohol.   Family History: The patient's family history includes Diabetes in her father; Heart attack in her father; Hyperlipidemia in an other family member; Hypertension in her father.   Review of Systems: Please see the history of present illness.   Otherwise, the review of systems is positive for visual disturbance, depression, back pain, muscle pains, and anxiety. No chest pain.   All other systems are reviewed and negative.   Physical Exam: VS:  BP 140/80 mmHg  Pulse 57  Ht 5\' 4"  (1.626 m)  Wt 338 lb 6.4 oz (153.497 kg)  BMI 58.06 kg/m2  SpO2 93% .  BMI Body mass index is 58.06 kg/(m^2).  Wt Readings from Last 3  Encounters:  05/23/14 338 lb 6.4 oz (153.497 kg)  12/21/13 358 lb (162.388 kg)  11/02/13 365 lb (165.563 kg)    General: Depressed, flat affect. She is morbidly obese. In no acute distress.  HEENT: Normal. Neck: Supple, no JVD, carotid bruits, or masses noted.  Cardiac:Regular rate and rhythm. Heart tones are quite distant.  No edema.  Respiratory:  Lungs are clear to auscultation bilaterally with normal work of breathing.  GI: Soft and nontender. Abdomen is obese.  MS: No deformity or atrophy. Gait and ROM intact. Skin: Warm and dry. Color is normal.  Neuro:  Strength and sensation are intact and no gross focal deficits noted.  Psych: Alert, appropriate and with normal affect.   LABORATORY DATA:  EKG:  EKG is ordered today. This demonstrates sinus bradycardia. Low voltage. Looks unchanged.   Lab Results  Component Value Date   WBC 8.6 03/08/2013   HGB 14.9 03/08/2013   HCT 44.2 03/08/2013   PLT 291 03/08/2013   GLUCOSE 122* 12/21/2013   ALT 15 03/08/2013   AST 17 03/08/2013   NA 140 12/21/2013   K 4.6 12/21/2013   CL 104 12/21/2013   CREATININE 1.0 12/21/2013   BUN 12 12/21/2013   CO2 28 12/21/2013   TSH 4.696* 05/18/2010   INR 1.51* 03/08/2013    BNP (last 3 results) No results for input(s): BNP in the last 8760 hours.  ProBNP (last 3 results) No results for input(s): PROBNP in the last 8760 hours.   Other Studies Reviewed Today:   Assessment/Plan: 1. HTN - BP by me is fair. She is off of her Diltiazem. Not able to afford. Would stay on her current regimen. See back in 3 months. Increase ACE if needed.   2. Paroxysmal atrial fibrillation: No recurrence. She is tolerating Xarelto. Needs surveillance labs  3. Endometrial cancer: FU with oncology.  4. Depression - seems to be the most pressing issue at this time  5. Obesity - has lost 20 pounds. Encouraged her to continue with her efforts at weight loss.  Current medicines are reviewed with the  patient today.  The patient does not have concerns regarding medicines other than what has been noted above.  The following changes have been made:  See above.  Labs/ tests ordered today include:    Orders Placed This Encounter  Procedures  . Basic metabolic panel  . CBC  . TSH     Disposition:   FU with me in 3 months  Patient is agreeable to this plan and will call if any problems develop in the interim.   Signed: Burtis Junes, RN, ANP-C 05/23/2014 3:00 PM  [facility] [address] [place], [place]  [address] Phone: [phone] Fax: [phone]

## 2014-05-30 ENCOUNTER — Telehealth: Payer: Self-pay | Admitting: Cardiology

## 2014-05-30 ENCOUNTER — Other Ambulatory Visit: Payer: Self-pay | Admitting: Cardiology

## 2014-05-30 MED ORDER — RIVAROXABAN 20 MG PO TABS: ORAL_TABLET | ORAL | Status: DC

## 2014-05-30 NOTE — Telephone Encounter (Signed)
Per pt call - states copay is $40 for Diltiazem CD and she can not afford it.  It was decided at her last office visit that this be d/ced for this reason.  Pt is now calling c/o feeling shaky inside, last night she was very irritable and felt like she was going to have another attack of At Fib (though she doesn't believe she has been in At Fib).  Reports BP is 140/80 after taking her medications about 2 hours ago.  She does not know her HR though she reports it was 60 last night.  Wants to know if there is something else she should take for her At Fib to prevent her from going back into it - that she can afford.  According to the [place] on line: Tier 1 Diltiazem HCL (short acting generic Cardizem) Diltiazem SR 12 hour (generic Cardizem SR) Diltiazem HCL capsules (generic Dilacor XR) Verapamil (generic Calan or Verelan) or Generic Calan SR   Aware I will review with Dr [name] and call her back once I have a new order.  She agreeable.

## 2014-05-30 NOTE — Telephone Encounter (Signed)
Patient c/o Palpitations:  High priority if patient c/o lightheadedness and shortness of breath.  AFIB 1. How long have you been having palpitations? overnight  2. Are you currently experiencing lightheadedness and shortness of breath? No... Just the shaking  3. Have you checked your BP and heart rate? (document readings) No not this morning. But last night before BP med intake was 160/90. But after the meds set in it dropped to 138/70.   4. Are you experiencing any other symptoms? Really nervous with a vibration inside  Comment: really needs "something" because she couldn't rest last night

## 2014-05-31 NOTE — Telephone Encounter (Signed)
Lm to cb to discuss med change.  Gave her the [place] office number.

## 2014-05-31 NOTE — Telephone Encounter (Signed)
Try diltiazem SR 120 BID  thx Candee Furbish, MD

## 2014-06-01 ENCOUNTER — Other Ambulatory Visit: Payer: Self-pay

## 2014-06-01 MED ORDER — DILTIAZEM HCL ER 120 MG PO CP12: 120.0000 mg | ORAL_CAPSULE | Freq: Two times a day (BID) | ORAL | Status: DC

## 2014-06-01 NOTE — Telephone Encounter (Signed)
Patient return Pam's call i looked in the chart saw Pam's note and told the patient about her med change and sent her rx into pharmacy

## 2014-06-07 ENCOUNTER — Other Ambulatory Visit: Payer: Self-pay | Admitting: Cardiology

## 2014-06-07 NOTE — Telephone Encounter (Signed)
RX was sent into pharmacy 2/25 by Guinevere Ferrari - see medication list.

## 2014-06-29 ENCOUNTER — Telehealth: Payer: Self-pay | Admitting: Internal Medicine

## 2014-06-29 ENCOUNTER — Other Ambulatory Visit: Payer: Self-pay | Admitting: Cardiology

## 2014-06-29 NOTE — Telephone Encounter (Signed)
Called [facility] and they are going to refax the PA. Will await fax.

## 2014-07-03 NOTE — Telephone Encounter (Signed)
PA still has not been received. I had to leave a message with her pharmacy to refax.

## 2014-07-04 ENCOUNTER — Other Ambulatory Visit: Payer: Self-pay | Admitting: Cardiology

## 2014-07-04 ENCOUNTER — Other Ambulatory Visit: Payer: Self-pay | Admitting: *Deleted

## 2014-07-04 ENCOUNTER — Encounter: Payer: Self-pay | Admitting: *Deleted

## 2014-07-04 NOTE — Telephone Encounter (Signed)
PA request form was received  It did not give any info as of who to call, no number listed at all  I did however, notice that Dr. Stephanie Acre is the prescribing MD for this med, and we have not seen the pt since May 2015 I called the pt and advised that she needs to come in for f/u and bring her formulary  She verbalized understanding  OV 07/10/14 1:30 pm

## 2014-07-04 NOTE — Telephone Encounter (Signed)
Spoke with [facility] in [place].  They are refaxing PA form.  Will await fax.

## 2014-07-10 ENCOUNTER — Ambulatory Visit: Payer: Self-pay | Admitting: Internal Medicine

## 2014-08-12 ENCOUNTER — Other Ambulatory Visit: Payer: Self-pay | Admitting: Cardiology

## 2014-08-22 ENCOUNTER — Ambulatory Visit: Payer: 59 | Admitting: Nurse Practitioner

## 2014-09-11 ENCOUNTER — Encounter: Payer: Self-pay | Admitting: Cardiology

## 2014-09-11 ENCOUNTER — Other Ambulatory Visit: Payer: Self-pay | Admitting: *Deleted

## 2014-09-11 ENCOUNTER — Ambulatory Visit (INDEPENDENT_AMBULATORY_CARE_PROVIDER_SITE_OTHER): Payer: 59 | Admitting: Cardiology

## 2014-09-11 VITALS — BP 122/78 | HR 51 | Ht 64.0 in | Wt 356.0 lb

## 2014-09-11 DIAGNOSIS — I480 Paroxysmal atrial fibrillation: Secondary | ICD-10-CM | POA: Diagnosis not present

## 2014-09-11 DIAGNOSIS — C541 Malignant neoplasm of endometrium: Secondary | ICD-10-CM

## 2014-09-11 DIAGNOSIS — Z01810 Encounter for preprocedural cardiovascular examination: Secondary | ICD-10-CM

## 2014-09-11 MED ORDER — DILTIAZEM HCL ER 120 MG PO CP12: 120.0000 mg | ORAL_CAPSULE | Freq: Every day | ORAL | Status: DC

## 2014-09-11 NOTE — Progress Notes (Signed)
CARDIOLOGY OFFICE NOTE  Date:  09/11/2014    Alexis [name] Date of Birth: 08-11-1955 Medical Record #[id number]  PCP:  Lilian Coma, MD  Cardiologist:  [name]   No chief complaint on file.    History of Present Illness: Alexis [name] is a 59 y.o. female who presents today for a hx of PAF, HTN, diet controlled DM2, CKD, statin intol, asthma, obesity, and endometrial CA. Admitted in 2012 with AF with RVR. She was initially placed on Pradaxa for [facility] but is now on Xarelto.   Last seen by me 08/2013. Beta blocker was stopped in the setting significant depression. Saw Richardson Dopp, PA back in the fall - BP was up - probably using too much salt. Was back on her beta blocker - he tried to cut her metoprolol back due to bradycardia - however, she is back taking.  Needs hysterectomy but has been told she is high risk and would be optimal to lose 100 pounds prior to consideration. She would be in reverse Trendelenburg position. Obviously, restriction on her ventilation would be present. She has been evaluated in the past for bariatric surgery but has lost insurance periodically. Very sedentary.  She now has insurance once again.  Past Medical History  Diagnosis Date  . Paroxysmal a-fib   . Morbid obesity   . HTN (hypertension)   . Asthma 01/21/2013    Dr. Melvyn Novas  . Endometrial cancer 01/21/2013    Dr. Polly Cobia, stage I-10/14  . Uterine cancer     Past Surgical History  Procedure Laterality Date  . Nasal sinus surgery    . Appendectomy    . Dilation and curettage of uterus    . Ablation       Medications: Current Outpatient Prescriptions  Medication Sig Dispense Refill  . acetaminophen (TYLENOL) 500 MG tablet Take 1,000 mg by mouth every 8 (eight) hours as needed.     Marland Kitchen albuterol (PROVENTIL HFA;VENTOLIN HFA) 108 (90 BASE) MCG/ACT inhaler Inhale 2 puffs into the lungs every 6 (six) hours as needed for wheezing.    . budesonide-formoterol (SYMBICORT) 160-4.5 MCG/ACT inhaler  Inhale 2 puffs into the lungs 2 (two) times daily.    Marland Kitchen buPROPion (WELLBUTRIN XL) 150 MG 24 hr tablet Take 150 mg by mouth daily. welbutrin    . diltiazem (CARDIZEM SR) 120 MG 12 hr capsule Take 1 capsule (120 mg total) by mouth 2 (two) times daily. 60 capsule 6  . furosemide (LASIX) 40 MG tablet Take 40 mg by mouth.    . letrozole (FEMARA) 2.5 MG tablet Take 2.5 mg by mouth daily.    Marland Kitchen lisinopril (PRINIVIL,ZESTRIL) 10 MG tablet Take 1 tablet (10 mg total) by mouth 2 (two) times daily. 60 tablet 11  . methocarbamol (ROBAXIN) 500 MG tablet Take 500 mg by mouth as needed for muscle spasms.    . metoprolol tartrate (LOPRESSOR) 25 MG tablet TAKE ONE TABLET BY MOUTH TWICE DAILY 60 tablet 3  . rivaroxaban (XARELTO) 20 MG TABS tablet TAKE 1 TABLET EVERY DAY WITH SUPPER 30 tablet 11  . traMADol (ULTRAM) 50 MG tablet Take 50 mg by mouth every 6 (six) hours as needed.     No current facility-administered medications for this visit.    Allergies: Allergies  Allergen Reactions  . Prednisone Other (See Comments)    Raises her blood pressure  . Sulfa Antibiotics Nausea And Vomiting    Social History: The patient  reports that she has never smoked. She has never  used smokeless tobacco. She reports that she does not drink alcohol.   Family History: The patient's family history includes Diabetes in her father; Heart attack in her father; Hyperlipidemia in an other family member; Hypertension in her father.   Review of Systems: Please see the history of present illness.   Otherwise, the review of systems is positive for visual disturbance, depression, back pain, muscle pains, and anxiety. No chest pain.   All other systems are reviewed and negative.   Physical Exam: VS:  BP 122/78 mmHg  Pulse 51  Ht 5\' 4"  (1.626 m)  Wt 356 lb (161.481 kg)  BMI 61.08 kg/m2 .  BMI Body mass index is 61.08 kg/(m^2).  Wt Readings from Last 3 Encounters:  09/11/14 356 lb (161.481 kg)  05/23/14 338 lb 6.4 oz (153.497  kg)  12/21/13 358 lb (162.388 kg)    General:  She is morbidly obese. In no acute distress.  HEENT: Normal. Neck: Supple, no JVD, carotid bruits, or masses noted.  Cardiac:Regular rate and rhythm. Heart tones are quite distant.  No edema.  Respiratory:  Lungs are clear to auscultation bilaterally with normal work of breathing.  GI: Soft and nontender. Abdomen is obese.  MS: No deformity or atrophy. Gait and ROM intact. Skin: Warm and dry. Color is normal.  Neuro:  Strength and sensation are intact and no gross focal deficits noted.  Psych: Alert, appropriate and with normal affect.   LABORATORY DATA:  EKG:  This demonstrates sinus bradycardia. Low voltage. Looks unchanged.   Lab Results  Component Value Date   WBC 9.0 05/23/2014   HGB 14.8 05/23/2014   HCT 43.2 05/23/2014   PLT 287.0 05/23/2014   GLUCOSE 96 05/23/2014   ALT 15 03/08/2013   AST 17 03/08/2013   NA 138 05/23/2014   K 4.6 05/23/2014   CL 102 05/23/2014   CREATININE 0.96 05/23/2014   BUN 13 05/23/2014   CO2 31 05/23/2014   TSH 1.93 05/23/2014   INR 1.51* 03/08/2013    BNP (last 3 results) No results for input(s): BNP in the last 8760 hours.  ProBNP (last 3 results) No results for input(s): PROBNP in the last 8760 hours.   Other Studies Reviewed Today:   Assessment/Plan: 1. HTN - well controlled. Continue with diltiazem 120 mg once a day instead of twice a day. Bradycardia noted with heart rate of 51 bpm..  Increase ACE if needed.   2. Paroxysmal atrial fibrillation: No recurrence. She is tolerating Xarelto. Needs surveillance labs  3. Endometrial cancer: FU with oncology. From a preoperative cardiovascular assessment-she would be moderate risk. Her atrial fibrillation is under good control. Her risk of myocardial infarction should be less than 2%. Her main risk would be pulmonary, ventilation during position during surgery.  4. Depression - seems to be the most pressing issue at this  time  5. Obesity - has lost 20 pounds. Encouraged her to continue with her efforts at weight loss.  Current medicines are reviewed with the patient today.  The patient does not have concerns regarding medicines other than what has been noted above.  The following changes have been made:  See above.  Labs/ tests ordered today include:    No orders of the defined types were placed in this encounter.     Disposition:   FU with me in 3 months  Patient is agreeable to this plan and will call if any problems develop in the interim.   Signed: Candee Furbish, MD  09/11/2014 [facility] [address] [place], [place]  [address] Phone: [phone] Fax: [phone]

## 2014-09-11 NOTE — Patient Instructions (Signed)
Medication Instructions:  Please decrease your Diltiazem to 120 mg once a day. Continue all other medications as listed.  Follow-Up: Follow up in 6 months with Dr. [name].  You will receive a letter in the mail 2 months before you are due.  Please call us when you receive this letter to schedule your follow up appointment.  Thank you for choosing [facility]!!

## 2014-10-27 ENCOUNTER — Telehealth: Payer: Self-pay | Admitting: Cardiology

## 2014-10-27 NOTE — Telephone Encounter (Signed)
New message    Pt having treatment Monday for arthritis in knee.   Pt has questions regarding her medication and the knee treatment. Please call to advise

## 2014-10-27 NOTE — Telephone Encounter (Signed)
Spoke with pt who was wanting to know that it is OK for her to have Hyaluronic acid injected into her knee on Monday.  She reports she had problems with her HTN after having cortisone injections before.  Advised pt she should discuss this with the MD that is doing the injection however hyaluronic acid is not a steroid like cortisone is.  Pt thanked me for my time.

## 2014-11-07 ENCOUNTER — Other Ambulatory Visit: Payer: Self-pay | Admitting: Cardiology

## 2014-11-07 ENCOUNTER — Other Ambulatory Visit: Payer: Self-pay

## 2014-11-07 MED ORDER — LISINOPRIL 10 MG PO TABS: 10.0000 mg | ORAL_TABLET | Freq: Two times a day (BID) | ORAL | Status: DC

## 2014-12-11 ENCOUNTER — Other Ambulatory Visit: Payer: Self-pay | Admitting: Cardiology

## 2014-12-13 ENCOUNTER — Telehealth: Payer: Self-pay

## 2014-12-13 ENCOUNTER — Other Ambulatory Visit: Payer: Self-pay | Admitting: Cardiology

## 2014-12-13 NOTE — Telephone Encounter (Signed)
Patient wants you to know she tried decreasing Diltiazem to once daily, but felt that she needed the second dose, as her blood pressure was higher in the evening.

## 2014-12-14 NOTE — Telephone Encounter (Signed)
OK to continue BID diltiazem then. Lets keep an eye on symptoms with low heart rate.   Candee Furbish, MD

## 2014-12-15 NOTE — Telephone Encounter (Signed)
Patient informed. 

## 2014-12-19 DIAGNOSIS — J4520 Mild intermittent asthma, uncomplicated: Secondary | ICD-10-CM | POA: Insufficient documentation

## 2014-12-20 ENCOUNTER — Telehealth: Payer: Self-pay

## 2014-12-20 NOTE — Telephone Encounter (Signed)
Ask MS

## 2014-12-20 NOTE — Telephone Encounter (Signed)
Pt pharmacy called wanting to know if we can switch her Diliczem to ER 24 hour once a day due to insurance. Pharmacy can be reach at [phone]

## 2014-12-22 NOTE — Telephone Encounter (Signed)
OK with me. Dilt CD 240 QD  Candee Furbish, MD

## 2014-12-22 NOTE — Telephone Encounter (Signed)
Pharmacy aware and will make the change.

## 2015-01-05 ENCOUNTER — Ambulatory Visit (INDEPENDENT_AMBULATORY_CARE_PROVIDER_SITE_OTHER): Payer: 59 | Admitting: *Deleted

## 2015-01-05 ENCOUNTER — Encounter: Payer: Self-pay | Admitting: *Deleted

## 2015-01-05 ENCOUNTER — Telehealth: Payer: Self-pay | Admitting: Cardiology

## 2015-01-05 VITALS — BP 118/68 | HR 59 | Wt 349.0 lb

## 2015-01-05 DIAGNOSIS — I480 Paroxysmal atrial fibrillation: Secondary | ICD-10-CM | POA: Diagnosis not present

## 2015-01-05 NOTE — Telephone Encounter (Signed)
Patient informed that she could come to the [place] office to have her EKG done. Patient said she would come around 1:00 pm today for an EKG.

## 2015-01-05 NOTE — Telephone Encounter (Signed)
New Message  Pt going into a special residential program EKG is a requirement. Going to be in [employer] today. Request a call back to discuss receiving an EKG today. Please call back asap

## 2015-01-05 NOTE — Patient Instructions (Signed)
Will fax EKG and office note per request to [facility] in [place] at [phone]    Also [facility] for Dr. Bryon Lions at [phone]

## 2015-01-05 NOTE — Progress Notes (Signed)
Pt called into office today to see if she could have an EKG. States she is going to be admitted to an special residential program and they are requiring and EKG for admission. Pt states she is being admitted for eating disorder at [facility] in [place]. EKG performed and read by Gaspar Bidding Hager,PA/Flex She wants EKG to be faxed to [facility]; att: [place] at fax [phone].  She would like notation on cover page to read enclosed office note from Dr. [name] from 09/11/14 and a release form; also put note to tell [place] to ask for records from primary, Dr. Bryon Lions. Also EKG and office note to be sent to [facility], Att: Landry Dyke at fax [phone].

## 2015-01-09 ENCOUNTER — Telehealth: Payer: Self-pay | Admitting: Nurse Practitioner

## 2015-01-09 NOTE — Telephone Encounter (Signed)
She remains moderate risk, mostly from super morbid obesity. No further testing or optimization. Discussed with Truitt Merle, NP.   Candee Furbish, MD

## 2015-01-09 NOTE — Telephone Encounter (Signed)
Phone call today regarding Ms. Krauss from [place] has called - says that Ms. Wilford is showing more cancer cells and will be needing robotic hysterectomy or radiation.  Calling here to see if "anything else needs to be done" from a cardiac standpoint. I shared Dr. [name] comments at her last visit where he deemed her moderate risk.  I told Ebony Hail I would forward this on to Dr. [name] for his review.   Allison's # is [id number].  Burtis Junes, RN, [facility] [address] [place], [place]  [address] [phone]

## 2015-01-10 NOTE — Telephone Encounter (Signed)
I have spoken with Worthy Flank, PA and given her Dr. [name] recommendation as he has outlined.

## 2015-03-09 ENCOUNTER — Telehealth: Payer: Self-pay | Admitting: Cardiology

## 2015-03-09 NOTE — Telephone Encounter (Signed)
Error

## 2015-03-22 ENCOUNTER — Other Ambulatory Visit: Payer: Self-pay | Admitting: *Deleted

## 2015-03-22 NOTE — Telephone Encounter (Signed)
Error

## 2015-03-22 NOTE — Telephone Encounter (Signed)
Pharmacy requesting 120mg , but per phone note 12/22/14, patient was switching to diltiazem cd 240mg  qd. Just wanted to clarify what current therapy should be. Please advise. Thanks, MI

## 2015-03-23 MED ORDER — DILTIAZEM HCL ER COATED BEADS 240 MG PO CP24: 240.0000 mg | ORAL_CAPSULE | Freq: Every day | ORAL | Status: DC

## 2015-04-04 ENCOUNTER — Telehealth: Payer: Self-pay | Admitting: Cardiology

## 2015-04-04 NOTE — Telephone Encounter (Signed)
OK to take but will slightly increase risk of bleeding. Benefit probably not worth the risk per Dr [name].

## 2015-04-04 NOTE — Telephone Encounter (Signed)
New Message  Pt c/o medication issue: 1. Name of Medication: Omega 3  4. What is your medication issue? Can she take the medication

## 2015-04-04 NOTE — Telephone Encounter (Signed)
Pt aware of Dr [name] recommendations.  She states she would like to take it for for arthritis. Advised it does not anti-inflammatory properties and she will need to consider the risk vs benefit she would obtain.  She states understanding and thanked me for my time.

## 2015-06-16 ENCOUNTER — Other Ambulatory Visit: Payer: Self-pay | Admitting: Cardiology

## 2015-07-12 ENCOUNTER — Telehealth: Payer: Self-pay | Admitting: Cardiology

## 2015-07-12 NOTE — Telephone Encounter (Signed)
New Message:   Pt called in wanting to know if it is ok for her to take [place] Tartrate or Tumeric in capsule form for inflammation per her physical therapist. She wasn't sure if this would counteract with her Xarelto. Please f/u with her

## 2015-07-12 NOTE — Telephone Encounter (Signed)
Reviewed with Dr [name] - who would not advise to use either of these supplements while taking Xarelto.  Attempted to contact pt without success.  Will try again tomorrow.

## 2015-07-13 NOTE — Telephone Encounter (Signed)
Lm to cb.

## 2015-07-16 NOTE — Telephone Encounter (Signed)
Patient wants to take supplement turmeric or cherry tartrate instead of Xarelto. Not sure of blood thinning properties of these supplements, and informed patient that she is taking Xarelto for her A. Fib to prevent having a stroke. Patient wants to know Dr. [name] opinion. Will forward to Dr. [name] for advisement.

## 2015-07-16 NOTE — Telephone Encounter (Signed)
Follow up:   Returning call from 07-13-15,please call her today if possible.

## 2015-07-18 NOTE — Telephone Encounter (Signed)
Pt calling because she is having all over joint pain and needs something for inflammation.  States her ortho MD gives her Norco for pain but she needs an anti-inflammatory medication.  Advised she needs to continue her Xarelto and not take tumeric or cherry tartrate per Dr [name].  She was offered the option of coming into the office to discuss her needs with a pharmacist.  She states she will c/b if she decides to do this.

## 2015-07-29 ENCOUNTER — Other Ambulatory Visit: Payer: Self-pay | Admitting: Cardiology

## 2015-08-30 ENCOUNTER — Telehealth: Payer: Self-pay | Admitting: *Deleted

## 2015-08-30 ENCOUNTER — Other Ambulatory Visit: Payer: Self-pay | Admitting: *Deleted

## 2015-08-30 MED ORDER — RIVAROXABAN 20 MG PO TABS: ORAL_TABLET | ORAL | Status: DC

## 2015-08-30 NOTE — Telephone Encounter (Signed)
refill 

## 2015-09-06 ENCOUNTER — Ambulatory Visit: Payer: Self-pay | Admitting: Cardiology

## 2015-09-18 ENCOUNTER — Telehealth: Payer: Self-pay | Admitting: Cardiology

## 2015-09-18 NOTE — Telephone Encounter (Signed)
Spoke with pt who states she was only calling to let Dr [name] know she had been off her Xarelto for 6 days and is having a colonoscopy tomorrow.  She was advised to call and let Dr [name] know.  He is aware and request in the future the patient call prior to stopping her Xarelto on her own.  She should have only had to held it for 2 days prior.  Pt is aware of this and stated understanding.  She is aware to restart Xarelto ASAP after testing has been completed and GI MD feels it safe.

## 2015-09-18 NOTE — Telephone Encounter (Signed)
Follow-up ° ° ° ° °The pt is returning the nurses call °

## 2015-09-18 NOTE — Telephone Encounter (Signed)
Left message to c/b.

## 2015-09-18 NOTE — Telephone Encounter (Signed)
New Message  Request for surgical clearance:  1. What type of surgery is being performed? Colonoscopy   2. When is this surgery scheduled? 09/18/2015   3. Are there any medications that need to be held prior to surgery and how long? Pt states that she has been off of her Xarelto for about 6 days now because she was advised to come off per [facility] with Dr. Alvester Chou. Pt states that she has had so many surgeries prior that she already knew how to come off of the medication. However she wants Dr. Kingsley Plan to be aware of her movement as it pertains to her medication, and she is requesting a call back to determine if she is ok to have the surgery.    4. Name of physician performing surgery? [facility] with Dr. Alvester Chou   5. What is your office phone and fax number? Phone: [phone] Fax: [phone]  Comments: Pt was aware to have the office call and request the clearance but pt decided to come off of the Xarelto on her own. Please call back to discuss.

## 2015-09-20 ENCOUNTER — Encounter: Payer: Self-pay | Admitting: Cardiology

## 2015-09-29 ENCOUNTER — Other Ambulatory Visit: Payer: Self-pay | Admitting: Cardiology

## 2015-10-01 ENCOUNTER — Telehealth: Payer: Self-pay | Admitting: Cardiology

## 2015-10-01 NOTE — Telephone Encounter (Signed)
Left message to c/b to discuss. 

## 2015-10-01 NOTE — Telephone Encounter (Signed)
Alexis [name] is calling because she woke up with AFIB this morning. She is schedule to see [name] PA on Friday 10/05/15. She ran out of her Diltiazem , but the pharmacy only gave a few . ( They wouldn't refill until she saw the doctor) . Please call   Thanks

## 2015-10-03 ENCOUNTER — Ambulatory Visit: Payer: Self-pay | Admitting: Cardiology

## 2015-10-05 ENCOUNTER — Ambulatory Visit: Payer: Self-pay | Admitting: Cardiology

## 2015-10-05 NOTE — Telephone Encounter (Signed)
Pt never called back despite leaving messages.  She also has cancelled several f/u appts.  She is scheduled for 7/13 with Richardson Dopp.

## 2015-10-08 ENCOUNTER — Other Ambulatory Visit: Payer: Self-pay

## 2015-10-08 MED ORDER — RIVAROXABAN 20 MG PO TABS: ORAL_TABLET | ORAL | Status: DC

## 2015-10-17 NOTE — Progress Notes (Signed)
Cardiology Office Note:    Date:  10/18/2015   ID:  [name], DOB 21-Apr-1955, MRN [id number]  PCP:  Apolonio Schneiders, MD  Cardiologist:  Dr. Candee Furbish   Electrophysiologist:  N/a Oncologist: Benjamine Mola in [place], M.D. [facility])  Referring MD: Glendale Chard, MD   Chief Complaint  Patient presents with  . Follow-up    Atrial Fibrillation    History of Present Illness:     Alexis [name] is a 60 y.o. female with a hx of PAF, HTN, diet controlled DM2, CKD, statin intol, asthma, obesity, endometrial CA. Admitted in 2012 with AF with RVR. She was initially placed on Pradaxa for [facility] but then changed to Xarelto. Beta blocker had been stopped in the past in the setting significant depression. Last seen by Dr. [name] 6/16.  She called in last month with symptoms of AFib.  She was set up for an appt but she could not be reached.  She now returns for FU.    She is here alone today. She notes that the episode of atrial fibrillation several weeks ago was the first in a long time. She's not had a recurrence since. She went [place] the fire [place] her [place] and an ECG was done. Apparently her heart rate was up in the 150s. She did take an extra metoprolol. She had some chest discomfort with this. However, she denies exertional chest pain. She denies significant dyspnea. She denies PND.  She has chronic LE edema without significant change. She denies syncope.  Past Medical History  Diagnosis Date  . Paroxysmal a-fib ([place])   . Morbid obesity ([place])   . HTN (hypertension)   . Asthma 01/21/2013    Dr. Melvyn Novas  . Endometrial cancer ([place]) 01/21/2013    Dr. Polly Cobia, stage I-10/14  . Uterine cancer [facility])     Past Surgical History  Procedure Laterality Date  . Nasal sinus surgery    . Appendectomy    . Dilation and curettage of uterus    . Ablation      Current Medications: Outpatient Prescriptions Prior to Visit  Medication Sig Dispense Refill  . albuterol (PROVENTIL HFA;VENTOLIN HFA) 108  (90 BASE) MCG/ACT inhaler Inhale 2 puffs into the lungs every 6 (six) hours as needed for wheezing.    . budesonide-formoterol (SYMBICORT) 160-4.5 MCG/ACT inhaler Inhale 2 puffs into the lungs 2 (two) times daily.    Marland Kitchen buPROPion (WELLBUTRIN XL) 150 MG 24 hr tablet Take 150 mg by mouth daily. welbutrin    . letrozole (FEMARA) 2.5 MG tablet Take 2.5 mg by mouth daily.    . methocarbamol (ROBAXIN) 500 MG tablet Take 500 mg by mouth as needed for muscle spasms.    . traMADol (ULTRAM) 50 MG tablet Take 50 mg by mouth every 6 (six) hours as needed for severe pain.     . furosemide (LASIX) 40 MG tablet Take 40 mg by mouth daily.     Marland Kitchen lisinopril (PRINIVIL,ZESTRIL) 10 MG tablet Take 1 tablet (10 mg total) by mouth 2 (two) times daily. 60 tablet 11  . metoprolol tartrate (LOPRESSOR) 25 MG tablet TAKE 1/2 TABLET  BY MOUTH IN THE MORNING AND 1/2 TABLET 0.5 MG AT NIGHT    . rivaroxaban (XARELTO) 20 MG TABS tablet TAKE ONE TABLET BY MOUTH ONCE DAILY AT SUPPER 30 tablet 0  . acetaminophen (TYLENOL) 500 MG tablet Take 1,000 mg by mouth every 8 (eight) hours as needed for moderate pain. Reported on 10/18/2015    . diltiazem (CARDIZEM  CD) 240 MG 24 hr capsule Take 1 capsule (240 mg total) by mouth daily. (Patient not taking: Reported on 10/18/2015) 30 capsule 11  . diltiazem (TIAZAC) 120 MG 24 hr capsule TAKE 1 CAPSULE EVERY DAY (Patient not taking: Reported on 10/18/2015) 30 capsule 0   No facility-administered medications prior to visit.      Allergies:   Prednisone; Sulfa antibiotics; and Sulfamethoxazole   Social History   Social History  . Marital Status: Married    Spouse Name: N/A  . Number of Children: N/A  . Years of Education: N/A   Social History Main Topics  . Smoking status: Never Smoker   . Smokeless tobacco: Never Used  . Alcohol Use: No  . Drug Use: None  . Sexual Activity: Not Asked   Other Topics Concern  . None   Social History Narrative     Family History:  The patient's  family history includes Diabetes in her father; Heart attack in her father; Hypertension in her father.   ROS:   Please see the history of present illness.    Review of Systems  Cardiovascular: Positive for irregular heartbeat and leg swelling.  Hematologic/Lymphatic: Bruises/bleeds easily.  Musculoskeletal: Positive for back pain and joint swelling.  Neurological: Positive for dizziness and loss of balance.  Psychiatric/Behavioral: Positive for depression.   All other systems reviewed and are negative.   Physical Exam:    VS:  BP 112/64 mmHg  Pulse 54  Ht 5\' 5"  (1.651 m)  Wt 355 lb 6.4 oz (161.208 kg)  BMI 59.14 kg/m2   Physical Exam  Constitutional: She is oriented to person, place, and time. She appears well-developed and well-nourished. No distress.  HENT:  Head: Normocephalic and atraumatic.  Neck:  I cannot assess JVD  Cardiovascular: Normal rate, regular rhythm and normal heart sounds.   No murmur heard. Pulmonary/Chest: Effort normal. She has no wheezes. She has no rales.  Abdominal: Soft.  Musculoskeletal:  1-2+ bilat LE edema  Neurological: She is alert and oriented to person, place, and time.  Skin: Skin is warm and dry.  Psychiatric: She has a normal mood and affect.    Wt Readings from Last 3 Encounters:  10/18/15 355 lb 6.4 oz (161.208 kg)  01/05/15 349 lb (158.305 kg)  09/11/14 356 lb (161.481 kg)      Studies/Labs Reviewed:     EKG:  EKG is  ordered today.  The ekg ordered today demonstrates Sinus brady, HR 55, low voltage, NSSTTW changes, QTc 405 ms  Recent Labs: No results found for requested labs within last 365 days.   Recent Lipid Panel No results found for: CHOL, TRIG, HDL, CHOLHDL, VLDL, LDLCALC, LDLDIRECT  Additional studies/ records that were reviewed today include:   - Echo (2/12): poor study, LVF appears to be preserved - Echo (1/07): EF 55-65%, no RWMA - Nuclear (1/07): No ischemia, EF 73%   ASSESSMENT:     1. Paroxysmal  a-fib (H[facility])   2. Essential hypertension   3. Endometrial cancer ([place])   4. Chronic pain     PLAN:     In order of problems listed above:  1. PAF - CHADS2-VASc=3.  Continue Xarelto, Dilt, Metoprolol.  She had one recurrent episode recently.  She knows to take an extra Metoprolol if she has recurrent AF. If these episodes continue, will need to consider AAD therapy.  BMET, CBC today.    2. HTN - BP controlled.  She has spikes in her BP at home  sometimes.  Given her low HR, I rec she take an extra Lisinopril if her BP gets high.  BMET today.  3. Endometrial CA - s/p resection and XR therapy.  Followed at [facility].  4. Chronic pain - She is on narcotic pain meds already.  She asked about NSAIDs. I rec she take these only sparingly.  If her pain escalates, she should d/w Ortho +/- referral to pain clinic.   Medication Adjustments/Labs and Tests Ordered: Current medicines are reviewed at length with the patient today.  Concerns regarding medicines are outlined above.  Medication changes, Labs and Tests ordered today are outlined in the Patient Instructions noted below. Patient Instructions  Medication Instructions:  No changes. See your medication list. If your BP is running less than 120/70 and you feel dizzy, skip your morning dose of Metoprolol.  If your BP is running high (greater than 160/90) take an extra Lisinopril 10 mg to bring it down.   Labwork: Today - BMET, CBC  Testing/Procedures: None   Follow-Up: Dr. Candee Furbish in 6 mos.   Any Other Special Instructions Will Be Listed Below (If Applicable). If you need a refill on your cardiac medications before your next appointment, please call your pharmacy.    Signed, Richardson Dopp, PA-C  10/18/2015 10:58 AM    [facility] [address], [place], [place]  [address] Phone: [phone]; Fax: [phone]

## 2015-10-18 ENCOUNTER — Ambulatory Visit (INDEPENDENT_AMBULATORY_CARE_PROVIDER_SITE_OTHER): Payer: BLUE CROSS/BLUE SHIELD | Admitting: Physician Assistant

## 2015-10-18 ENCOUNTER — Encounter: Payer: Self-pay | Admitting: Physician Assistant

## 2015-10-18 VITALS — BP 112/64 | HR 54 | Ht 65.0 in | Wt 355.4 lb

## 2015-10-18 DIAGNOSIS — I10 Essential (primary) hypertension: Secondary | ICD-10-CM | POA: Diagnosis not present

## 2015-10-18 DIAGNOSIS — I480 Paroxysmal atrial fibrillation: Secondary | ICD-10-CM | POA: Diagnosis not present

## 2015-10-18 DIAGNOSIS — G8929 Other chronic pain: Secondary | ICD-10-CM | POA: Diagnosis not present

## 2015-10-18 DIAGNOSIS — C541 Malignant neoplasm of endometrium: Secondary | ICD-10-CM | POA: Diagnosis not present

## 2015-10-18 LAB — BASIC METABOLIC PANEL
BUN: 8 mg/dL (ref 7–25)
CALCIUM: 8.6 mg/dL (ref 8.6–10.4)
CO2: 28 mmol/L (ref 20–31)
Chloride: 103 mmol/L (ref 98–110)
Creat: 0.92 mg/dL (ref 0.50–1.05)
Glucose, Bld: 114 mg/dL — ABNORMAL HIGH (ref 65–99)
Potassium: 3.8 mmol/L (ref 3.5–5.3)
SODIUM: 140 mmol/L (ref 135–146)

## 2015-10-18 LAB — CBC WITH DIFFERENTIAL/PLATELET
BASOS ABS: 0 {cells}/uL (ref 0–200)
Basophils Relative: 0 %
EOS ABS: 285 {cells}/uL (ref 15–500)
EOS PCT: 5 %
HCT: 38.4 % (ref 35.0–45.0)
Hemoglobin: 12.7 g/dL (ref 11.7–15.5)
LYMPHS PCT: 12 %
Lymphs Abs: 684 cells/uL — ABNORMAL LOW (ref 850–3900)
MCH: 30.4 pg (ref 27.0–33.0)
MCHC: 33.1 g/dL (ref 32.0–36.0)
MCV: 91.9 fL (ref 80.0–100.0)
MONOS PCT: 6 %
MPV: 8.9 fL (ref 7.5–12.5)
Monocytes Absolute: 342 cells/uL (ref 200–950)
NEUTROS ABS: 4389 {cells}/uL (ref 1500–7800)
NEUTROS PCT: 77 %
PLATELETS: 216 10*3/uL (ref 140–400)
RBC: 4.18 MIL/uL (ref 3.80–5.10)
RDW: 13.4 % (ref 11.0–15.0)
WBC: 5.7 10*3/uL (ref 3.8–10.8)

## 2015-10-18 MED ORDER — RIVAROXABAN 20 MG PO TABS: ORAL_TABLET | ORAL | Status: DC

## 2015-10-18 MED ORDER — FUROSEMIDE 40 MG PO TABS: 40.0000 mg | ORAL_TABLET | Freq: Every day | ORAL | Status: DC

## 2015-10-18 MED ORDER — DILTIAZEM HCL 120 MG PO TABS: 120.0000 mg | ORAL_TABLET | Freq: Every day | ORAL | Status: DC

## 2015-10-18 MED ORDER — LISINOPRIL 10 MG PO TABS: 10.0000 mg | ORAL_TABLET | Freq: Two times a day (BID) | ORAL | Status: DC

## 2015-10-18 MED ORDER — METOPROLOL TARTRATE 25 MG PO TABS
12.5000 mg | ORAL_TABLET | Freq: Two times a day (BID) | ORAL | Status: DC
Start: 2015-10-18 — End: 2016-09-25

## 2015-10-18 NOTE — Patient Instructions (Addendum)
Medication Instructions:  No changes. See your medication list. If your BP is running less than 120/70 and you feel dizzy, skip your morning dose of Metoprolol.  If your BP is running high (greater than 160/90) take an extra Lisinopril 10 mg to bring it down.   Labwork: Today - BMET, CBC  Testing/Procedures: None   Follow-Up: Dr. Candee Furbish in 6 mos.   Any Other Special Instructions Will Be Listed Below (If Applicable). If you need a refill on your cardiac medications before your next appointment, please call your pharmacy.

## 2015-10-19 ENCOUNTER — Telehealth: Payer: Self-pay | Admitting: *Deleted

## 2015-10-19 NOTE — Telephone Encounter (Signed)
Pt notified of lab results by phone with verbal understanding.  

## 2016-01-26 DIAGNOSIS — Z86010 Personal history of colonic polyps: Secondary | ICD-10-CM | POA: Insufficient documentation

## 2016-02-01 ENCOUNTER — Other Ambulatory Visit: Payer: Self-pay | Admitting: Cardiology

## 2016-02-01 NOTE — Telephone Encounter (Signed)
diltiazem (CARDIZEM) 120 MG tablet  Medication  Date: 10/18/2015 Department: [address] Office Ordering: Liliane Shi, PA-C Authorizing: Jerline Pain, MD  Order Providers   Prescribing Provider Encounter Provider  Jerline Pain, MD Liliane Shi, PA-C  Supervision Information   Supervising Provider Type of Supervision  Deboraha Sprang, MD Incident To  Medication Detail    Disp Refills Start End   diltiazem (CARDIZEM) 120 MG tablet 90 tablet 3 10/18/2015    Sig - Route: Take 1 tablet (120 mg total) by mouth daily. - Oral   E-Prescribing Status: Receipt confirmed by pharmacy (10/18/2015 10:54 AM EDT)   Pharmacy   [id number] - [place], [place]

## 2016-02-11 DIAGNOSIS — N183 Chronic kidney disease, stage 3 unspecified: Secondary | ICD-10-CM | POA: Insufficient documentation

## 2016-02-11 DIAGNOSIS — M7989 Other specified soft tissue disorders: Secondary | ICD-10-CM | POA: Insufficient documentation

## 2016-02-25 DIAGNOSIS — F3342 Major depressive disorder, recurrent, in full remission: Secondary | ICD-10-CM | POA: Insufficient documentation

## 2016-02-25 DIAGNOSIS — M25561 Pain in right knee: Secondary | ICD-10-CM | POA: Insufficient documentation

## 2016-03-18 ENCOUNTER — Telehealth: Payer: Self-pay | Admitting: Physician Assistant

## 2016-03-18 NOTE — Telephone Encounter (Signed)
Left pt 2 bottles of Xarelto 20 mg tablet at the front desk for pt to pick up. [id number]  EXP: 3/20

## 2016-04-02 ENCOUNTER — Encounter: Payer: Self-pay | Admitting: Cardiology

## 2016-04-02 ENCOUNTER — Telehealth: Payer: Self-pay | Admitting: *Deleted

## 2016-04-02 NOTE — Telephone Encounter (Signed)
called pt and informed her that 2 bottles of xarelto was left up front.

## 2016-06-02 ENCOUNTER — Telehealth: Payer: Self-pay | Admitting: Internal Medicine

## 2016-06-02 NOTE — Telephone Encounter (Signed)
lmtcb X1 for pt. Pt last seen in 2015- has ov with MW on 3/13.

## 2016-06-03 MED ORDER — BUDESONIDE-FORMOTEROL FUMARATE 160-4.5 MCG/ACT IN AERO: 2.0000 | INHALATION_SPRAY | Freq: Two times a day (BID) | RESPIRATORY_TRACT | 0 refills | Status: DC

## 2016-06-03 NOTE — Telephone Encounter (Signed)
lmtcb X2 for pt.  

## 2016-06-03 NOTE — Telephone Encounter (Signed)
Pt states that she needs a Symbicort sample to get her through to her next appt scheduled on 06/17/16 with MW.  Pt states that her prescriptions are getting to be too expensive and she is wanting to hold off on getting this refilled to her pharmacy until she sees MW to discuss possible med changes. Pt aware that I will leave 1 sample up front for her to pick up. Nothing further needed.

## 2016-06-06 DIAGNOSIS — Z79899 Other long term (current) drug therapy: Secondary | ICD-10-CM | POA: Insufficient documentation

## 2016-06-17 ENCOUNTER — Ambulatory Visit: Payer: BLUE CROSS/BLUE SHIELD | Admitting: Internal Medicine

## 2016-06-18 ENCOUNTER — Telehealth: Payer: Self-pay | Admitting: Cardiology

## 2016-06-18 NOTE — Telephone Encounter (Signed)
Will review with Dr [name] and c/b

## 2016-06-18 NOTE — Telephone Encounter (Signed)
New Message  Pt voiced spoke with pcp and she's having trouble with copay for Xarelto.   Pt voiced PCP will monitor blood test if pt can go on coumadin.   Pt wants to know if she can go on coumadin and her pcp(Re[facility]) monitor her.

## 2016-06-18 NOTE — Telephone Encounter (Signed)
Yes ok to go on coumadin with her PCP.   Candee Furbish, MD

## 2016-06-18 NOTE — Telephone Encounter (Signed)
Pt aware OK for her to change to coumadin and have her PCP follow her PT/INRs.  Pt will contact them to discuss further.

## 2016-07-01 ENCOUNTER — Ambulatory Visit (INDEPENDENT_AMBULATORY_CARE_PROVIDER_SITE_OTHER)
Admission: RE | Admit: 2016-07-01 | Discharge: 2016-07-01 | Disposition: A | Discharge status: Home / Self Care | Payer: Medicare HMO | Source: Ambulatory Visit | Attending: Internal Medicine | Admitting: Internal Medicine

## 2016-07-01 ENCOUNTER — Encounter: Payer: Self-pay | Admitting: Internal Medicine

## 2016-07-01 ENCOUNTER — Ambulatory Visit (INDEPENDENT_AMBULATORY_CARE_PROVIDER_SITE_OTHER): Payer: Medicare HMO | Admitting: Internal Medicine

## 2016-07-01 VITALS — BP 112/76 | HR 54 | Ht 64.0 in | Wt 336.2 lb

## 2016-07-01 DIAGNOSIS — J4540 Moderate persistent asthma, uncomplicated: Secondary | ICD-10-CM

## 2016-07-01 DIAGNOSIS — I10 Essential (primary) hypertension: Secondary | ICD-10-CM

## 2016-07-01 LAB — NITRIC OXIDE: Other: 39

## 2016-07-01 MED ORDER — BUDESONIDE-FORMOTEROL FUMARATE 160-4.5 MCG/ACT IN AERO: 2.0000 | INHALATION_SPRAY | Freq: Two times a day (BID) | RESPIRATORY_TRACT | 0 refills | Status: DC

## 2016-07-01 MED ORDER — LOSARTAN POTASSIUM 50 MG PO TABS
50.0000 mg | ORAL_TABLET | Freq: Every day | ORAL | 11 refills | Status: DC
Start: 2016-07-01 — End: 2016-08-01

## 2016-07-01 NOTE — Progress Notes (Signed)
Subjective:    Patient ID: Alexis [name], female    DOB: May 26, 1955  MRN: [id number]    Brief patient profile:  32  yowf with ovarian ca dx stage 05 Jan 2013 and since then having trouble with daytime fatigue and drowsiness so referred 08/24/2013 to pulmonary clinic for ?OSA  Prev seen 2008 for asthma: DATE:05/25/2006 DOB: 20-Mar-1956  HISTORY:  history of difficult to  control asthma. Last seen  on April 13, 2006 with the  recommendation that she maintain Symbicort at 160/4.5 two puffs b.i.d.  Take empiric Protonix at 40 mg b.i.d. before meals, which she failed to  do, and try Singulair 10 mg q.p.m. She said that Singulair did  nothing for her, and stopped it after a couple of weeks but is  convinced that Symbicort is helping, and that she is using less  albuterol than normal. It turns out, however, that she is still using  albuterol 4 or 5 times a day. She states she does not typically wake up  at night and need it.   Pulmonary function tests were reviewed from January 22, and indicate an  FEV1 of only 56% predicted with a ratio of 53% and a 15% improvement  after bronchodilators. rec gerd rx plus symbicort 160 2bid > all symptoms resolved once learned technique    08/24/2013 1st [facility] office visit/ Alexis [name]   Off gerd rx/ on symbicort 160 2bid and ACEi now Chief Complaint  Patient presents with  . Advice Only    Old MW pt to reestablish care for Asthma, sleep study.   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 Why wakes 8 am not refreshed funny in the head and feels drowsy 10- noon. Only drives 10 min and on trips gets drowsy but husband always drives her.  rec Stop lisinopril Start micardis 80 mg one half daily  You need to breath clean air to reduce your risk of asthma flares  Read for 30 min before bed nightly - if you do wake up no light We will schedule you for a sleep study in one month      07/01/2016   Extended  ov/Alexis [name] re: reestablish re asthma / back on ACEi Chief Complaint  Patient presents with  . Follow-up    asthma doingok, insurance will pay for symbicort but the copay is too high  for the past month has needed alb twice daily including one neb rx which is unusual for her with progressive doe "just about anything" and assoc dry day > noct cough  Difficulty sleeping due to nasal congestion/ cough sleeping mostly L side now    No obvious day to day or daytime variability or assoc excess/ purulent sputum or mucus plugs or hemoptysis or cp or chest tightness, subjective wheeze or overt hb symptoms. No unusual exp hx or h/o childhood pna/ asthma or knowledge of premature birth.    Also denies any obvious fluctuation of symptoms with weather or environmental changes or other aggravating or alleviating factors except as outlined above   Current Medications, Allergies, Complete Past Medical History, Past Surgical History, Family History, and Social History were reviewed in [employer] record.  ROS  The following are not active complaints unless bolded sore throat, dysphagia, dental problems, itching, sneezing,  nasal congestion or excess/ purulent secretions, ear ache,   fever, chills, sweats, unintended wt loss,  classically pleuritic or exertional cp,  orthopnea pnd or leg swelling, presyncope, palpitations, abdominal pain, anorexia, nausea, vomiting, diarrhea  or change in bowel or bladder habits, change in stools or urine, dysuria,hematuria,  rash, arthralgias, visual complaints, headache, numbness, weakness or ataxia or problems with walking or coordination,  change in mood/affect or memory.                  Objective:   Physical Exam   07/01/2016        336   08/24/13 361 lb (163.749 kg)  08/19/13 359 lb (162.841 kg)  03/08/13 369 lb (167.377 kg)       Obese amb wf nad/ vital signs reviewed:   - Note on arrival 02 sats  99% on RA         HEENT: nl dentition, turbinates bilaterally, and oropharynx. Nl external ear canals without cough reflex   NECK :  without JVD/Nodes/TM/ nl carotid upstrokes bilaterally   LUNGS: no acc muscle use,  Nl contour chest which is clear to A and P bilaterally without cough on insp or exp maneuvers   CV:  RRR  no s3 or murmur or increase in P2, and no edema   ABD:  soft and nontender with nl inspiratory excursion in the supine position. No bruits or organomegaly appreciated, bowel sounds nl  MS:  Nl gait/ ext warm without deformities, calf tenderness, cyanosis or clubbing No obvious joint restrictions   SKIN: warm and dry without lesions    NEURO:  alert, approp, nl sensorium with  no motor or cerebellar deficits apparent.     CXR PA and Lateral:   07/01/2016 :    I personally reviewed images and agree with radiology impression as follows:   Coarse lung markings in the lower retrosternal region suggests subsegmental atelectasis. There is no alveolar pneumonia nor CHF.           Assessment & Plan:

## 2016-07-01 NOTE — Patient Instructions (Signed)
Stop lisinopril and start losartan 50 mg daily in it's place, increase lasix as per your PC   Continue symbicort 160 Take 2 puffs first thing in am and then another 2 puffs about 12 hours later.   Work on inhaler technique:  relax and gently blow all the way out then take a nice smooth deep breath back in, triggering the inhaler at same time you start breathing in.  Hold for up to 5 seconds if you can. Blow out thru nose. Rinse and gargle with water when done  Only use your albuterol as a rescue medication to be used if you can't catch your breath by resting or doing a relaxed purse lip breathing pattern.  - The less you use it, the better it will work when you need it. - Ok to use up to 2 puffs  every 4 hours if you must but call for immediate appointment if use goes up over your usual need - Don't leave home without it !!  (think of it like the spare tire for your car)   Whenever you are coughing > Try prilosec otc 20mg   Take 30-60 min before first meal of the day and Pepcid ac (famotidine) 20 mg one @  bedtime until cough is completely gone for at least a week without the need for cough suppression    GERD (REFLUX)  is an extremely common cause of respiratory symptoms just like yours , many times with no obvious heartburn at all.    It can be treated with medication, but also with lifestyle changes including elevation of the head of your bed (ideally with 6 inch  bed blocks),  Smoking cessation, avoidance of late meals, excessive alcohol, and avoid fatty foods, chocolate, peppermint, colas, red wine, and acidic juices such as orange juice.  NO MINT OR MENTHOL PRODUCTS SO NO COUGH DROPS   USE SUGARLESS CANDY INSTEAD (Jolley ranchers or Stover's or Life Savers) or even ice chips will also do - the key is to swallow to prevent all throat clearing. NO OIL BASED VITAMINS - use powdered substitutes.   Please remember to go to the  x-ray department downstairs in the basement  for your tests - we  will call you with the results when they are available.    Please schedule a follow up office visit in 4 weeks, sooner if needed

## 2016-07-02 DIAGNOSIS — M170 Bilateral primary osteoarthritis of knee: Secondary | ICD-10-CM | POA: Insufficient documentation

## 2016-07-02 NOTE — Assessment & Plan Note (Signed)
Trial off acei 08/24/2013 due to cough - again try off 07/01/2016   In the best review of chronic cough to date ( NEJM 2016 375 5015-8682) ,  ACEi are now felt to cause cough in up to  20% of pts which is a 4 fold increase from previous reports and does not include the variety of non-specific complaints we see in pulmonary clinic in pts on ACEi but previously attributed to another dx like  Copd/asthma and  include PNDS, throat and chest congestion, "bronchitis", unexplained dyspnea and noct "strangling" sensations, and hoarseness, but also  atypical /refractory GERD symptoms like dysphagia and "bad heartburn"   The only way I know  to prove this is not an "ACEi Case" is a trial off ACEi x a minimum of 6 weeks then regroup.   Try losartan 50 mg daily

## 2016-07-02 NOTE — Assessment & Plan Note (Signed)
-   rec trial off acei 08/24/2013 and again 07/01/2016  - Spirometry 07/01/2016  FEV1 1.21 (47%)  Ratio 69 mild curvature  - FENO 07/01/2016  =   39  - 07/01/2016  After extensive coaching HFA effectiveness =   75% from a 25 % baseline    DDX of  difficult airways management almost all start with A and  include Adherence, Ace Inhibitors, Acid Reflux, Active Sinus Disease, Alpha 1 Antitripsin deficiency, Anxiety masquerading as Airways dz,  ABPA,  Allergy(esp in young), Aspiration (esp in elderly), Adverse effects of meds,  Active smokers, A bunch of PE's (a small clot burden can't cause this syndrome unless there is already severe underlying pulm or vascular dz with poor reserve) plus two Bs  = Bronchiectasis and Beta blocker use..and one C= CHF   Adherence is always the initial "prime suspect" and is a multilayered concern that requires a "trust but verify" approach in every patient - starting with knowing how to use medications, especially inhalers, correctly, keeping up with refills and understanding the fundamental difference between maintenance and prns vs those medications only taken for a very short course and then stopped and not refilled.  - see hfa training   ACEi adverse effects at the  top of the usual list of suspects and the only way to rule it out is a trial off > see a/p    ? Allergy suggested by intermediate feno so keep on higher dose of ICS  ? Acid (or non-acid) GERD > always difficult to exclude as up to 75% of pts in some series report no assoc GI/ Heartburn symptoms> rec max (24h)  acid suppression and diet restrictions/ reviewed and instructions given in writing.    ? Anxiety > usually at the bottom of this list of usual suspects    Will try the above first then regroup in 4 weeks  I had an extended discussion with the patient reviewing all relevant studies completed to date and  lasting 25 minutes of a 40  minute office  visit  To re-establish re  non-specific but potentially  very serious refractory respiratory symptoms of unknown etiology.  Each maintenance medication was reviewed in detail including most importantly the difference between maintenance and prns and under what circumstances the prns are to be triggered using an action plan format that is not reflected in the computer generated alphabetically organized AVS.    Please see AVS for specific instructions unique to this office visit that I personally wrote and verbalized to the the pt in detail and then reviewed with pt  by my nurse highlighting any changes in therapy/plan of care  recommended at today's visit.     Marland Kitchen

## 2016-07-02 NOTE — Assessment & Plan Note (Signed)
Body mass index is 57.71 kg/m.  trending down/ encouraged Lab Results  Component Value Date   TSH 1.93 05/23/2014     Contributing to gerd risk/ doe/reviewed the need and the process to achieve and maintain neg calorie balance > defer f/u primary care including intermittently monitoring thyroid status

## 2016-07-09 DIAGNOSIS — Z7901 Long term (current) use of anticoagulants: Secondary | ICD-10-CM | POA: Insufficient documentation

## 2016-07-09 NOTE — Progress Notes (Signed)
LMTCB

## 2016-07-15 ENCOUNTER — Encounter: Payer: Self-pay | Admitting: Internal Medicine

## 2016-07-15 MED ORDER — BUDESONIDE-FORMOTEROL FUMARATE 160-4.5 MCG/ACT IN AERO: 2.0000 | INHALATION_SPRAY | Freq: Two times a day (BID) | RESPIRATORY_TRACT | 0 refills | Status: DC

## 2016-07-15 NOTE — Telephone Encounter (Signed)
MW  Please Advise-  Please see pt email   

## 2016-07-15 NOTE — Telephone Encounter (Signed)
Ok to give sample of symbicort and move up f/u to 2 weeks (before it runs out) to see what next step should be if not 100% better and let her know symbicort if very effective for allergy related coughing especially if breathes it out thru her nose

## 2016-07-16 ENCOUNTER — Telehealth: Payer: Self-pay | Admitting: Internal Medicine

## 2016-07-16 NOTE — Telephone Encounter (Signed)
Upon looking in pt's chart she already has a follow up appointment in 2 weeks on 08/01/16. At this time did not schedule another appointment. Will close this message

## 2016-07-17 ENCOUNTER — Telehealth: Payer: Self-pay | Admitting: Cardiology

## 2016-07-17 NOTE — Telephone Encounter (Signed)
Spoke with pt who is reporting her HR has been slow and hard for her to find.  Last night and this AM her BP was elevated (170/100).  She reports her Lisinopril was d/ced approximately 2 weeks ago by Dr Melvyn Novas and she was started on Losartan to see if it would help decrease the congestion she was having.  She states it has not decreased the congestion by changing medications.  She is asking his she should change medication back to Lisinopril.  Advised pt she should contact Dr Melvyn Novas about that since the change was made by him but that I will forward this documentation to Dr [name] for review/his knowledge and recommendations.  Pt reports she is going to go into a [facility] to have her BP and HR checked but that she is going to go back on Lisinopril.  Advised again she should discuss that change with Dr Melvyn Novas.  Pt thanked me and stated she would c/b with further concerns.

## 2016-07-17 NOTE — Telephone Encounter (Signed)
New message    Pt is calling concerning her Afib.  Pt c/o BP issue: STAT if pt c/o blurred vision, one-sided weakness or slurred speech  1. What are your last 5 BP readings? 170/100  2. Are you having any other symptoms (ex. Dizziness, headache, blurred vision, passed out)? headache  3. What is your BP issue? Pt states that her bp was very high last night. Pt states that her pulse is not back in rhythm.

## 2016-07-18 NOTE — Telephone Encounter (Signed)
Spoke with pt who is reporting her BP was OK last night at [facility] because she had taken her Metoprolol dose early.  She states the NP there called EMS to check her out because she c/o fatigue yesterday morning.  She reports EMS told her the "strip" they ran on her looked like she had a heart attack and advised her to go to the ED.  She states she feels fine and that is not necessary.  She will continue to monitor HR/BP and if she feels as if she needs to go to the ED she will.  She will keep appt as scheduled.  Dr  Gillian Shields is aware of the above information.

## 2016-07-18 NOTE — Telephone Encounter (Signed)
Reviewed with Dr [name].  He would prefer pt contact Dr Melvyn Novas for medication adjustment.

## 2016-07-18 NOTE — Telephone Encounter (Signed)
Follow up   Pt is calling stating that [facility] and was told to follow up with Dr. [name]. She says that she feels better this morning but is still concerned. An appt was scheduled for 07/30/16 at 3:30 with Truitt Merle. She is concerned if that is too far away.

## 2016-07-18 NOTE — Telephone Encounter (Signed)
Agree with plan Mark Skains, MD  

## 2016-07-30 ENCOUNTER — Ambulatory Visit: Payer: Medicare HMO | Admitting: Nurse Practitioner

## 2016-08-01 ENCOUNTER — Other Ambulatory Visit (INDEPENDENT_AMBULATORY_CARE_PROVIDER_SITE_OTHER): Payer: Medicare HMO

## 2016-08-01 ENCOUNTER — Ambulatory Visit (INDEPENDENT_AMBULATORY_CARE_PROVIDER_SITE_OTHER): Payer: Medicare HMO | Admitting: Internal Medicine

## 2016-08-01 ENCOUNTER — Encounter: Payer: Self-pay | Admitting: Internal Medicine

## 2016-08-01 VITALS — BP 134/60 | HR 50 | Ht 64.0 in | Wt 335.0 lb

## 2016-08-01 DIAGNOSIS — J4540 Moderate persistent asthma, uncomplicated: Secondary | ICD-10-CM

## 2016-08-01 DIAGNOSIS — I10 Essential (primary) hypertension: Secondary | ICD-10-CM | POA: Diagnosis not present

## 2016-08-01 LAB — CBC WITH DIFFERENTIAL/PLATELET
BASOS PCT: 0.7 % (ref 0.0–3.0)
Basophils Absolute: 0 10*3/uL (ref 0.0–0.1)
EOS PCT: 8 % — AB (ref 0.0–5.0)
Eosinophils Absolute: 0.5 10*3/uL (ref 0.0–0.7)
HEMATOCRIT: 41.8 % (ref 36.0–46.0)
Hemoglobin: 14 g/dL (ref 12.0–15.0)
LYMPHS ABS: 0.8 10*3/uL (ref 0.7–4.0)
LYMPHS PCT: 14.2 % (ref 12.0–46.0)
MCHC: 33.6 g/dL (ref 30.0–36.0)
MCV: 86.8 fl (ref 78.0–100.0)
MONOS PCT: 4.2 % (ref 3.0–12.0)
Monocytes Absolute: 0.2 10*3/uL (ref 0.1–1.0)
NEUTROS PCT: 72.9 % (ref 43.0–77.0)
Neutro Abs: 4.2 10*3/uL (ref 1.4–7.7)
PLATELETS: 231 10*3/uL (ref 150.0–400.0)
RBC: 4.81 Mil/uL (ref 3.87–5.11)
RDW: 13.5 % (ref 11.5–15.5)
WBC: 5.8 10*3/uL (ref 4.0–10.5)

## 2016-08-01 MED ORDER — ALBUTEROL SULFATE HFA 108 (90 BASE) MCG/ACT IN AERS: 2.0000 | INHALATION_SPRAY | RESPIRATORY_TRACT | 11 refills | Status: DC | PRN

## 2016-08-01 MED ORDER — BUDESONIDE-FORMOTEROL FUMARATE 160-4.5 MCG/ACT IN AERO: 2.0000 | INHALATION_SPRAY | Freq: Two times a day (BID) | RESPIRATORY_TRACT | 0 refills | Status: DC

## 2016-08-01 NOTE — Patient Instructions (Addendum)
Plan A = Automatic = Symbicort 80 Take 2 puffs first thing in am and then another 2 puffs about 12 hours later.      Plan B = Backup Only use your albuterol(ventolin) as a rescue medication to be used if you can't catch your breath by resting or doing a relaxed purse lip breathing pattern.  - The less you use it, the better it will work when you need it. - Ok to use the inhaler up to 2 puffs  every 4 hours if you must but call for appointment if use goes up over your usual need - Don't leave home without it !!  (think of it like the spare tire for your car)    Whenever cough/ wheeze for any reason or having heart burn >  Try prilosec otc 20mg   Take 30-60 min before first meal of the day and Pepcid ac (famotidine) 20 mg one @  bedtime until cough is completely gone for at least a week without the need for cough suppression  GERD (REFLUX)  is an extremely common cause of respiratory symptoms just like yours , many times with no obvious heartburn at all.    It can be treated with medication, but also with lifestyle changes including elevation of the head of your bed (ideally with 6 inch  bed blocks),  Smoking cessation, avoidance of late meals, excessive alcohol, and avoid fatty foods, chocolate, peppermint, colas, red wine, and acidic juices such as orange juice.  NO MINT OR MENTHOL PRODUCTS SO NO COUGH DROPS   USE SUGARLESS CANDY INSTEAD (Jolley ranchers or Stover's or Life Savers) or even ice chips will also do - the key is to swallow to prevent all throat clearing. NO OIL BASED VITAMINS - use powdered substitutes.  If breathing/ wheezing / coughing getting worse on this regimen,  You will either need to change back to an ARB (losartan is the cheapest but may not be the best) and off lisinopril or Let Dr [name] or your Primary doctor refer you to another specialist for example an Allergist / asthma specialist in [facility] as there is nothing more I can do for you in this  circumstance.  Please schedule a follow up office visit in 4 weeks, sooner if needed with medication formulary

## 2016-08-01 NOTE — Progress Notes (Signed)
Subjective:    Patient ID: Alexis [name], female    DOB: 08/06/1955  MRN: [id number]    Brief patient profile:  36  yowf  Never smoker with ovarian ca dx stage 05 Jan 2013 and since then having trouble with daytime fatigue and drowsiness so referred 08/24/2013 to pulmonary clinic for ?OSA  Prev seen 2008 for asthma: DATE:05/25/2006 DOB: [birth year]  HISTORY:  history of difficult to  control asthma. Last seen  on April 13, 2006 with the  recommendation that she maintain Symbicort at 160/4.5 two puffs b.i.d.  Take empiric Protonix at 40 mg b.i.d. before meals, which she failed to  do, and try Singulair 10 mg q.p.m. She said that Singulair did  nothing for her, and stopped it after a couple of weeks but is  convinced that Symbicort is helping, and that she is using less  albuterol than normal. It turns out, however, that she is still using  albuterol 4 or 5 times a day. She states she does not typically wake up  at night and need it.   Pulmonary function tests were reviewed from January 22, and indicate an  FEV1 of only 56% predicted with a ratio of 53% and a 15% improvement  after bronchodilators. rec gerd rx plus symbicort 160 2bid > all symptoms resolved once learned technique    08/24/2013 1st [facility] office visit/ Alexis [name]   Off gerd rx/ on symbicort 160 2bid and ACEi now Chief Complaint  Patient presents with  . Advice Only    Old MW pt to reestablish care for Asthma, sleep study.   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 Why wakes 8 am not refreshed funny in the head and feels drowsy 10- noon. Only drives 10 min and on trips gets drowsy but husband always drives her.  rec Stop lisinopril Start micardis 80 mg one half daily  You need to breath clean air to reduce your risk of asthma flares  Read for 30 min before bed nightly - if you do wake up no light We will schedule you for a sleep study  in one month     07/01/2016   Extended  ov/Alexis [name] re: reestablish re asthma / back on ACEi Chief Complaint  Patient presents with  . Follow-up    asthma doingok, insurance will pay for symbicort but the copay is too high  for the past month has needed alb twice daily including one neb rx which is unusual for her with progressive doe "just about anything" and assoc dry day > noct cough  Difficulty sleeping due to nasal congestion/ cough sleeping mostly L side now rec Stop lisinopril and start losartan 50 mg daily in it's place, increase lasix as per your PC  Continue symbicort 160 Take 2 puffs first thing in am and then another 2 puffs about 12 hours later.  Work on inhaler technique:   Only use your albuterol as a rescue medication  Whenever you are coughing > Try prilosec otc 20mg   Take 30-60 min before first meal of the day and Pepcid ac (famotidine) 20 mg one @  bedtime until cough is completely gone for at least a week without the need for cough suppression GERD diet     08/01/2016  Extended f/u ov/Alexis [name] re:  dtc asthma on symb 160 2bid and avg saba once daily  Chief Complaint  Patient  presents with  . Follow-up    4wk rov- pt states there was no improvment in breathing the first three weeks after last OV, pt had switched back to lisinopril due to losartan not controlling BP. pt reports occ non prod cough clear mucus & mild wheezing  thinks better not due to off acei but due to allergies  better since rained so changed back to acei instead of arb  Using saba q hs x early March 2018 which is a new issue for her and remains a problem, thinks may be due to noct HB  No obvious other patterns in terms of day to day or daytime variability or assoc excess/ purulent sputum or mucus plugs or hemoptysis or cp or chest tightness,  or overt sinus   symptoms. No unusual exp hx or h/o childhood pna/ asthma or knowledge of premature birth.  Sleeping ok propped up 30 degrees  without nocturnal  or  early am exacerbation  of respiratory  c/o's or need for noct saba. Also denies any obvious fluctuation of symptoms with weather or environmental changes or other aggravating or alleviating factors except as outlined above   Current Medications, Allergies, Complete Past Medical History, Past Surgical History, Family History, and Social History were reviewed in [employer] record.  ROS  The following are not active complaints unless bolded sore throat, dysphagia, dental problems, itching, sneezing,  nasal congestion or excess/ purulent secretions, ear ache,   fever, chills, sweats, unintended wt loss, classically pleuritic or exertional cp,  orthopnea pnd or leg swelling, presyncope, palpitations, abdominal pain, anorexia, nausea, vomiting, diarrhea  or change in bowel or bladder habits, change in stools or urine, dysuria,hematuria,  rash, arthralgias, visual complaints, headache, numbness, weakness or ataxia or problems with walking or coordination,  change in mood/affect or memory.                      Objective:   Physical Exam  08/01/2016        335  07/01/2016        336   08/24/13 361 lb (163.749 kg)  08/19/13 359 lb (162.841 kg)  03/08/13 369 lb (167.377 kg)       Obese w/c bound wf nad/ vital signs reviewed:  - Note on arrival 02 sats  96% on RA          HEENT: nl dentition, turbinates bilaterally, and oropharynx. Nl external ear canals without cough reflex   NECK :  without JVD/Nodes/TM/ nl carotid upstrokes bilaterally   LUNGS: no acc muscle use,  Nl contour chest which is completely clear to A and P    CV:  RRR  no s3 or murmur or increase in P2, and  1+ pitting both lower ext edema   ABD:  soft and nontender with nl inspiratory excursion in the supine position. No bruits or organomegaly appreciated, bowel sounds nl  MS:  Nl gait/ ext warm without deformities, calf tenderness, cyanosis or clubbing No obvious joint restrictions   SKIN: warm  and dry without lesions    NEURO:  alert, approp, nl sensorium with  no motor or cerebellar deficits apparent.       CXR PA and Lateral:   07/01/2016 :    I personally reviewed images and agree with radiology impression as follows:   Coarse lung markings in the lower retrosternal region suggests subsegmental atelectasis. There is no alveolar pneumonia nor CHF.     Labs ordered 08/01/2016  Allergy profile       Assessment & Plan:

## 2016-08-02 NOTE — Assessment & Plan Note (Signed)
Body mass index is 57.5 kg/m.  -  Trending: no change from prior  Lab Results  Component Value Date   TSH 1.93 05/23/2014     Contributing to gerd risk/ doe/reviewed the need and the process to achieve and maintain neg calorie balance > defer f/u primary care including intermittently monitoring thyroid status

## 2016-08-02 NOTE — Assessment & Plan Note (Signed)
Trial off acei 08/24/2013 due to cough - again try off 07/01/2016 > back on it 08/01/2016   The record from 2015 was reviewed and indicates her asthma did very well for an extended period off acei (I believe x 2 years)   If her asthma remains difficult to control, she needs to be off acei permanently or seek another opinion from an allergy or asthma specialist who can claim to tell the difference between acei effects an atypical asthma (because I can't and I've had the most years of experience in dealing with this issue of all the providers here).   Marland Kitchen

## 2016-08-02 NOTE — Assessment & Plan Note (Addendum)
- rec trial off acei 08/24/2013 and again 07/01/2016 x 3 weeks only - Spirometry 07/01/2016  FEV1 1.21 (47%)  Ratio 69 mild curvature  - FENO 07/01/2016  =   39  - 08/01/2016  After extensive coaching HFA effectiveness =  90% from a baseline of 75  - Allergy profile 08/01/2016 >  Eos 0.5 /  IgE     She may well be right "much better since it rained" but note still requiring saba qhs for "wheezing" despite max rx with symbicort so clearly not at goal with same ddx as previous eval:  Adherence is always the initial "prime suspect" and is a multilayered concern that requires a "trust but verify" approach in every patient - starting with knowing how to use medications, especially inhalers, correctly, keeping up with refills and understanding the fundamental difference between maintenance and prns vs those medications only taken for a very short course and then stopped and not refilled.  - see hfa teaching above - rec return with all meds in hand using a trust but verify approach to confirm accurate Medication  Reconciliation The principal here is that until we are certain that the  patients are doing what we've asked, it makes no sense to ask them to do more.    ? Acid (or non-acid) GERD > always difficult to exclude as up to 75% of pts in some series report no assoc GI/ Heartburn symptoms> rec max (24h)  acid suppression and diet restrictions/ reviewed and instructions given in writing.   ? ACEi > if symptoms remain refractory I see no reason to use ACEi here at all given the overlap between dtca and acei effects and note she did not leave them off x 6 weeks as rec so hard to tell cause and effect but at present is doing reasonably well back on them > see hbp (defer to Dr [name])  ? Allergy > profile sent   ? Beta blocker effects not usually an issue on low dose lopressor but if high doses needed Strongly prefer in this setting: Bystolic, the most beta -1  selective Beta blocker available in sample form,  with bisoprolol the most selective generic choice  on the market.   ? chf > needs bnp next ov if not better> f/u cards planned   I had an extended discussion with the patient reviewing all relevant studies completed to date and  lasting 25 minutes of a 40  minute office  visit addressing persistent  non-specific but potentially very serious refractory respiratory symptoms of uncertain and potentially multiple  etiologies.  Discussed: Formulary restrictions will be an ongoing challenge for the forseable future and I would be happy to pick an alternative if the pt will first  provide me a list of them but pt  will need to return here for training for any new device that is required eg dpi vs hfa vs respimat.    In meantime we can always provide samples so the patient never runs out of any needed respiratory medications (as we did today x 4 weeks of symbicort 160 samples)    Each maintenance medication was reviewed in detail including most importantly the difference between maintenance and prns and under what circumstances the prns are to be triggered using an action plan format that is not reflected in the computer generated alphabetically organized AVS.    Please see AVS for specific instructions unique to this office visit that I personally wrote and verbalized to the the  pt in detail and then reviewed with pt  by my nurse highlighting any changes in therapy/plan of care  recommended at today's visit.

## 2016-08-04 LAB — RESPIRATORY ALLERGY PROFILE REGION II ~~LOC~~
Allergen, C. Herbarum, M2: <0.1 kU/L
Allergen, Cedar tree, t12: <0.1 kU/L
Allergen, Cottonwood, t14: <0.1 kU/L
Allergen, Mouse Urine Protein, e78: <0.1 kU/L
Allergen, Oak,t7: <0.1 kU/L
Bermuda Grass: <0.1 kU/L
Cat Dander: <0.1 kU/L
IgE (Immunoglobulin E), Serum: 12 kU/L (ref <115)
Pecan/Hickory Tree IgE: <0.1 kU/L
Rough Pigweed  IgE: <0.1 kU/L
Timothy Grass: <0.1 kU/L

## 2016-08-04 NOTE — Progress Notes (Signed)
Spoke with pt and notified of results per Dr. Wert. Pt verbalized understanding and denied any questions. 

## 2016-08-27 ENCOUNTER — Telehealth: Payer: Self-pay | Admitting: Internal Medicine

## 2016-08-27 MED ORDER — BUDESONIDE-FORMOTEROL FUMARATE 160-4.5 MCG/ACT IN AERO: 2.0000 | INHALATION_SPRAY | Freq: Two times a day (BID) | RESPIRATORY_TRACT | 0 refills | Status: DC

## 2016-08-27 NOTE — Telephone Encounter (Signed)
Pt requesting symbicort sample.  This has been placed up front for pickup.  Nothing further needed.

## 2016-08-29 ENCOUNTER — Ambulatory Visit: Payer: Medicare HMO | Admitting: Internal Medicine

## 2016-09-04 DIAGNOSIS — K219 Gastro-esophageal reflux disease without esophagitis: Secondary | ICD-10-CM | POA: Insufficient documentation

## 2016-09-09 ENCOUNTER — Telehealth: Payer: Self-pay | Admitting: Internal Medicine

## 2016-09-09 MED ORDER — BUDESONIDE-FORMOTEROL FUMARATE 160-4.5 MCG/ACT IN AERO: 2.0000 | INHALATION_SPRAY | Freq: Two times a day (BID) | RESPIRATORY_TRACT | 0 refills | Status: DC

## 2016-09-09 NOTE — Telephone Encounter (Signed)
Spoke with patient. Sample has been placed up front for patient. Nothing further needed at time of call.

## 2016-09-10 ENCOUNTER — Ambulatory Visit: Payer: Medicare HMO | Admitting: Adult Health

## 2016-09-12 DIAGNOSIS — Z6841 Body Mass Index (BMI) 40.0 and over, adult: Secondary | ICD-10-CM | POA: Insufficient documentation

## 2016-09-24 ENCOUNTER — Ambulatory Visit: Payer: Medicare HMO | Admitting: Acute Care

## 2016-09-25 ENCOUNTER — Telehealth: Payer: Self-pay | Admitting: Cardiology

## 2016-09-25 MED ORDER — VALSARTAN 80 MG PO TABS: 80.0000 mg | ORAL_TABLET | Freq: Every day | ORAL | Status: DC

## 2016-09-25 MED ORDER — WARFARIN SODIUM 6 MG PO TABS: 6.0000 mg | ORAL_TABLET | ORAL | Status: DC

## 2016-09-25 NOTE — Telephone Encounter (Signed)
New message    Pt is calling stating that she woke up at 4 this morning in [place]. She is calling to talk about her PCP trying to change her BP meds. Pt states that her PCP put her on diltiazem. They stopped her metoprolol and lisinopril. They started her on valsartan.

## 2016-09-25 NOTE — Telephone Encounter (Signed)
Follow up    Pt is calling back to RN. Pt states she took her BP and it was 130-140/80. Pt states her pulse was between 42-46.

## 2016-09-25 NOTE — Telephone Encounter (Signed)
Per pt  Medications have been changed due to hypertension went to ED   Metoprolol was stopped due to heart rate of 45 and lisinopril was stopped and changed to losartan  no improvement with b/p so medicine was changed again by PMD  to valsartan also was started on diltiazem 120 mg heart rate this am was 80  unable to count at this time is to fast  and  unable to get b/p as well. Pt feels is  In a fib  Discussed with Dr Angelena Form  Pt may take an extra  Diltiazem 120 mg today and  Call back tomorrow with  update .

## 2016-09-25 NOTE — Telephone Encounter (Signed)
Left voice mail of new directions for Diltiazem 120 mg .Adonis Housekeeper

## 2016-09-25 NOTE — Telephone Encounter (Signed)
Per pt will continue to monitor and will cal back in couple of  Days with an update ./cy

## 2016-09-26 NOTE — Telephone Encounter (Signed)
Spoke with the pt and informed her that per Dr [name], he agrees that she should continue to monitor, given she is asymptomatic with a low HR.  Informed the pt that per Dr [name], he would still like for her to continue new diltiazem.  Pt verbalized understanding and agrees with this plan.  Pt states she will be going to see her PCP today for follow-up, and have labs done.  Pt states she will update Dr [name] on how this visit went, if changes are made.  Pt gracious for all the assistance provided.

## 2016-09-26 NOTE — Telephone Encounter (Signed)
Agree with plan.  Heart rate low, but asymptomatic Continue new diltiazem.  Candee Furbish, MD

## 2016-09-28 ENCOUNTER — Encounter: Payer: Self-pay | Admitting: Internal Medicine

## 2016-09-29 NOTE — Telephone Encounter (Signed)
Per 6.24.18 e-mail from pt: Message   Dr Melvyn Novas,    I was supposed to have had an appt with Barbaraann Barthel, but I simply did not remember the appointment. I have been having some awful episodes of high blood pressure. You removed me from the Lisinopril and the emergency room doctor removed me from the Metoprolol. Then I had an appt with another doctor at my primaries practice. He increased the Losartan that you prescribed and it is what ended me up in the emergency room. My primary has put me on Valsartan 80 mg and I still take the Diltiazem and Lasix. This has finally stablilized me. It has been a hard row, and I am only now starting to feel human again.     You were right about the Lisinopril and for the most part I don't have to much congestion. I still have some, but I still feel that symbicort and albuterol are handling it pretty good. Not perfect, but pretty good.     I will call and make another appointment.  Thanks!  Emmilynn   Will forward to MW to make him aware E-mail sent to patient encouraging her to make appt as soon as she can so that we may continue her care

## 2016-10-09 ENCOUNTER — Telehealth: Payer: Self-pay | Admitting: Internal Medicine

## 2016-10-09 MED ORDER — BUDESONIDE-FORMOTEROL FUMARATE 160-4.5 MCG/ACT IN AERO: 2.0000 | INHALATION_SPRAY | Freq: Two times a day (BID) | RESPIRATORY_TRACT | 0 refills | Status: DC

## 2016-10-09 NOTE — Telephone Encounter (Signed)
1 sample left up front  I reminded her to be sure and keep next ov and bring formulary so we can choose a more affordable alternative  She verbalized understanding

## 2016-10-29 ENCOUNTER — Telehealth: Payer: Self-pay | Admitting: Internal Medicine

## 2016-10-29 MED ORDER — BUDESONIDE-FORMOTEROL FUMARATE 160-4.5 MCG/ACT IN AERO: 2.0000 | INHALATION_SPRAY | Freq: Two times a day (BID) | RESPIRATORY_TRACT | 0 refills | Status: DC

## 2016-10-29 NOTE — Telephone Encounter (Signed)
Noted. Will close this message.  

## 2016-10-29 NOTE — Telephone Encounter (Signed)
lmtcb x1 for pt. 1 sample of symbicort has been placed up front for pick up. Please advise pt to bring formulary to scheduled OV, so a affordable medication can be prescribed for her. Thanks.

## 2016-10-29 NOTE — Telephone Encounter (Signed)
Pt returned phone call; advised 1 sample of Symbicort was avail, for pickup. Informed pt to bring formulary to next OV, pt confirmed appt...ert

## 2016-11-07 ENCOUNTER — Encounter: Payer: Self-pay | Admitting: Internal Medicine

## 2016-11-07 ENCOUNTER — Ambulatory Visit (INDEPENDENT_AMBULATORY_CARE_PROVIDER_SITE_OTHER): Payer: Medicare HMO | Admitting: Internal Medicine

## 2016-11-07 VITALS — BP 132/80 | HR 63 | Ht 64.0 in | Wt 329.0 lb

## 2016-11-07 DIAGNOSIS — J4540 Moderate persistent asthma, uncomplicated: Secondary | ICD-10-CM

## 2016-11-07 DIAGNOSIS — I10 Essential (primary) hypertension: Secondary | ICD-10-CM

## 2016-11-07 MED ORDER — METHYLPREDNISOLONE ACETATE 80 MG/ML IJ SUSP
120.0000 mg | Freq: Once | INTRAMUSCULAR | Status: AC
  Administered 2016-11-07: 120 mg via INTRAMUSCULAR

## 2016-11-07 MED ORDER — BUDESONIDE-FORMOTEROL FUMARATE 80-4.5 MCG/ACT IN AERO: 2.0000 | INHALATION_SPRAY | Freq: Two times a day (BID) | RESPIRATORY_TRACT | 0 refills | Status: DC

## 2016-11-07 MED ORDER — FAMOTIDINE 20 MG PO TABS: 20.0000 mg | ORAL_TABLET | Freq: Every day | ORAL | 2 refills | Status: DC

## 2016-11-07 NOTE — Patient Instructions (Addendum)
Depomedrol 120 mg IM today   Prilosec 20 mg x 2 x 30 min before bfast and supper and take pepcid 20 mg at bedtime   For drainage / throat tickle try take CHLORPHENIRAMINE  4 mg - take one every 4 hours as needed - available over the counter- may cause drowsiness so start with just a bedtime dose or two and see how you tolerate it before trying in daytime  Plan A = Automatic = Symbicort 80 Take 2 puffs first thing in am and then another 2 puffs about 12 hours later.    Plan B = Backup Only use your albuterol as a rescue medication to be used if you can't catch your breath by resting or doing a relaxed purse lip breathing pattern.  - The less you use it, the better it will work when you need it. - Ok to use the inhaler up to 2 puffs  every 4 hours if you must but call for appointment if use goes up over your usual need - Don't leave home without it !!  (think of it like the spare tire for your car)   For cough you can use robitussin and supplement with tessalon 100 up to 2 every 6 hours as needed  See Tammy NP first available with all your medications, even over the counter meds, separated in two separate bags, the ones you take no matter what vs the ones you stop once you feel better and take only as needed when you feel you need them.   Tammy  will generate for you a new user friendly medication calendar that will put [place] all on the same page re: your medication use.     Without this process, it simply isn't possible to assure that we are providing  your outpatient care  with  the attention to detail we feel you deserve.   If we cannot assure that you're getting that kind of care,  then we cannot manage your problem effectively from this clinic.  Once you have seen Tammy and we are sure that we're all on the same page with your medication use she will arrange follow up with me.  Add:  Needs bnp and possible echo to r/o [place] on return

## 2016-11-07 NOTE — Progress Notes (Signed)
Subjective:    Patient ID: Alexis [name], female    DOB: 1956-02-06  MRN: [id number]    Brief patient profile:  57  yowf  Never smoker with ovarian ca dx stage 05 Jan 2013 and since then having trouble with daytime fatigue and drowsiness so referred 08/24/2013 to pulmonary clinic for ?OSA  Prev seen 2008 for asthma: DATE:05/25/2006 DOB: [birth year]  HISTORY:  history of difficult to  control asthma. Last seen  on April 13, 2006 with the  recommendation that she maintain Symbicort at 160/4.5 two puffs b.i.d.  Take empiric Protonix at 40 mg b.i.d. before meals, which she failed to  do, and try Singulair 10 mg q.p.m. She said that Singulair did  nothing for her, and stopped it after a couple of weeks but is  convinced that Symbicort is helping, and that she is using less  albuterol than normal. It turns out, however, that she is still using  albuterol 4 or 5 times a day. She states she does not typically wake up  at night and need it.   Pulmonary function tests were reviewed from January 22, and indicate an  FEV1 of only 56% predicted with a ratio of 53% and a 15% improvement  after bronchodilators. rec gerd rx plus symbicort 160 2bid > all symptoms resolved once learned technique    08/24/2013 1st [facility] office visit/ Wert   Off gerd rx/ on symbicort 160 2bid and ACEi now Chief Complaint  Patient presents with  . Advice Only    Old MW pt to reestablish care for Asthma, sleep study.   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 Why wakes 8 am not refreshed funny in the head and feels drowsy 10- noon. Only drives 10 min and on trips gets drowsy but husband always drives her.  rec Stop lisinopril Start micardis 80 mg one half daily  You need to breath clean air to reduce your risk of asthma flares  Read for 30 min before bed nightly - if you do wake up no light We will schedule you for a sleep study  in one month     07/01/2016   Extended  ov/Wert re: reestablish re asthma / back on ACEi Chief Complaint  Patient presents with  . Follow-up    asthma doingok, insurance will pay for symbicort but the copay is too high  for the past month has needed alb twice daily including one neb rx which is unusual for her with progressive doe "just about anything" and assoc dry day > noct cough  Difficulty sleeping due to nasal congestion/ cough sleeping mostly L side now rec Stop lisinopril and start losartan 50 mg daily in it's place, increase lasix as per your PC  Continue symbicort 160 Take 2 puffs first thing in am and then another 2 puffs about 12 hours later.  Work on inhaler technique:   Only use your albuterol as a rescue medication  Whenever you are coughing > Try prilosec otc 20mg   Take 30-60 min before first meal of the day and Pepcid ac (famotidine) 20 mg one @  bedtime until cough is completely gone for at least a week without the need for cough suppression GERD diet        08/01/2016  Extended f/u ov/Wert re:  dtc asthma on symb 160 2bid and avg saba once daily  Chief Complaint  Patient presents with  . Follow-up    4wk rov- pt states there was no improvment in breathing the first three weeks after last OV, pt had switched back to lisinopril due to losartan not controlling BP. pt reports occ non prod cough clear mucus & mild wheezing  thinks better not due to off acei but due to allergies  better since rained so changed back to acei instead of arb  Using saba q hs x early March 2018 which is a new issue for her and remains a problem, thinks may be due to noct HB rec Plan A = Automatic = Symbicort 80 Take 2 puffs first thing in am and then another 2 puffs about 12 hours later.  Plan B = Backup Only use your albuterol(ventolin)  Whenever cough/ wheeze for any reason or having heart burn >  Try prilosec otc 20mg   Take 30-60 min before first meal of the day and Pepcid ac (famotidine)  20 mg one @  bedtime until cough is completely gone for at least a week without the need for cough suppression GERD .  I f breathing/ wheezing / coughing getting worse on this regimen,  You will either need to change back to an ARB (losartan is the cheapest but may not be the best) and off lisinopril or Let Dr [name] or your Primary doctor refer you to another specialist for example an Allergist / asthma specialist in [facility] as there is nothing more I can do for you in this circumstance. Please schedule a follow up office visit in 4 weeks, sooner if needed with medication formulary > did not do   email 09/28/16: You were right about the Lisinopril and for the most part I don't have to much congestion. I still have some, but I still feel that symbicort and albuterol are handling it pretty good. Not perfect, but pretty good.    11/07/2016 extended acute f/u ov/Wert re: re-establish re cough/sob [name] saba use   Chief Complaint  Patient presents with  . Acute Visit    Increased cough since June 2018- prod with clear to white sputum.  She is using her albuterol inhaler 3-4 x per day on average.   worse with cough/ wheeze x 2 months not on 80 not on ppi/h2hs  Not able to verify meds as did not bring them as requested, seeing multiple providers "each tells me something different"   No obvious patterns day to day or daytime variability or assoc excess/ purulent sputum or mucus plugs or hemoptysis or cp or chest tightness, subjective wheeze or overt sinus or hb symptoms. No unusual exp hx or h/o childhood pna/ asthma or knowledge of premature birth.  Sleeping relatively well  without nocturnal  or early am exacerbation  of respiratory  c/o's or need for noct saba. Also denies any obvious fluctuation of symptoms with weather or environmental changes or other aggravating or alleviating factors except as outlined above   Current Medications, Allergies, Complete Past Medical History, Past Surgical  History, Family History, and Social History were reviewed in [employer] record.  ROS  The following are not active complaints unless bolded sore throat, dysphagia, dental problems, itching, sneezing,  nasal congestion or excess/ purulent secretions, ear ache,   fever, chills, sweats, unintended wt loss, classically pleuritic or exertional cp,  orthopnea pnd or leg swelling, presyncope, palpitations, abdominal pain, anorexia, nausea, vomiting, diarrhea  or change in bowel or bladder habits, change in stools  or urine, dysuria,hematuria,  rash, arthralgias, visual complaints, headache, numbness, weakness or ataxia or problems with walking or coordination,  change in mood/affect or memory.                    Objective:   Physical Exam  11/07/2016          329 08/01/2016        335  07/01/2016        336   08/24/13 361 lb (163.749 kg)  08/19/13 359 lb (162.841 kg)  03/08/13 369 lb (167.377 kg)       Obese w/c bound wf nad/ vital signs reviewed:  - Note on arrival 02 sats  94% on RA          HEENT: nl dentition, turbinates bilaterally, and oropharynx. Nl external ear canals without cough reflex   NECK :  without JVD/Nodes/TM/ nl carotid upstrokes bilaterally   LUNGS: no acc muscle use,  Nl contour chest which is completely clear to A and P    CV:  RRR  no s3 or murmur or increase in P2, and  1+ pitting both lower ext edema sym   ABD:  soft and nontender with nl inspiratory excursion in the supine position. No bruits or organomegaly appreciated, bowel sounds nl  MS:  Nl gait/ ext warm without deformities, calf tenderness, cyanosis or clubbing No obvious joint restrictions   SKIN: warm and dry without lesions    NEURO:  alert, approp, nl sensorium with  no motor or cerebellar deficits apparent.            Assessment & Plan:                     Objective:   Physical Exam   11/07/2016          329  08/01/2016        335  07/01/2016         336   08/24/13 361 lb (163.749 kg)  08/19/13 359 lb (162.841 kg)  03/08/13 369 lb (167.377 kg)       Obese w/c bound wf nad/ vital signs reviewed:  - Note on arrival 02 sats  96% on RA          HEENT: nl dentition, turbinates bilaterally, and oropharynx. Nl external ear canals without cough reflex   NECK :  without JVD/Nodes/TM/ nl carotid upstrokes bilaterally   LUNGS: no acc muscle use,  Nl contour chest which is completely clear to A and P    CV:  RRR  no s3 or murmur or increase in P2, and  1+ pitting both lower ext edema   ABD:  soft and nontender with nl inspiratory excursion in the supine position. No bruits or organomegaly appreciated, bowel sounds nl  MS:  Nl gait/ ext warm without deformities, calf tenderness, cyanosis or clubbing No obvious joint restrictions   SKIN: warm and dry without lesions    NEURO:  alert, approp, nl sensorium with  no motor or cerebellar deficits apparent.       CXR PA and Lateral:   07/01/2016 :    I personally reviewed images and agree with radiology impression as follows:   Coarse lung markings in the lower retrosternal region suggests subsegmental atelectasis. There is no alveolar pneumonia nor CHF.     Labs ordered 08/01/2016   Allergy profile       Assessment & Plan:

## 2016-11-08 NOTE — Assessment & Plan Note (Signed)
Body mass index is 56.47 kg/m.  -  trending  Down only slightly  Lab Results  Component Value Date   TSH 1.93 05/23/2014     Contributing to gerd risk/ doe/reviewed the need and the process to achieve and maintain neg calorie balance > defer f/u primary care including intermittently monitoring thyroid status

## 2016-11-08 NOTE — Assessment & Plan Note (Addendum)
Trial off acei 08/24/2013 due to cough - again try off 07/01/2016  email 09/28/16: You were right about the Lisinopril and for the most part I don't have to much congestion. I still have some, but I still feel that symbicort and albuterol are handling it pretty good. Not perfect, but pretty good.  Although even in retrospect it may not be clear the ACEi contributed to the pt's symptoms,  Pt improved off them and adding them back at this point or in the future would risk confusion in interpretation of non-specific respiratory symptoms to which this patient is prone  ie  Better not to muddy the waters here and permanently leave off acei - will list as allergy

## 2016-11-08 NOTE — Assessment & Plan Note (Addendum)
- rec trial off acei 08/24/2013 and again 07/01/2016 x 3 weeks only - Spirometry 07/01/2016  FEV1 1.21 (47%)  Ratio 69 mild curvature  - FENO 07/01/2016  =   39  - Allergy profile 08/01/2016 >  Eos 0.5 /  IgE  12 neg RAST  - 09/28/16 confirmed much better off acei (see email)   - 11/07/2016  After extensive coaching HFA effectiveness =  75% (short Ti) try symbicort 80 2bid/ depomedrol 120  DDX of  difficult airways management almost all start with A and  include Adherence, Ace Inhibitors, Acid Reflux, Active Sinus Disease, Alpha 1 Antitripsin deficiency, Anxiety masquerading as Airways dz,  ABPA,  Allergy(esp in young), Aspiration (esp in elderly), Adverse effects of meds,  Active smokers, A bunch of PE's (a small clot burden can't cause this syndrome unless there is already severe underlying pulm or vascular dz with poor reserve) plus two Bs  = Bronchiectasis and Beta blocker use..and one C= CHF   Adherence is always the initial "prime suspect" and is a multilayered concern that requires a "trust but verify" approach in every patient - starting with knowing how to use medications, especially inhalers, correctly, keeping up with refills and understanding the fundamental difference between maintenance and prns vs those medications only taken for a very short course and then stopped and not refilled.  - advised needs to always return to this office with all meds in hand using a trust but verify approach to confirm accurate Medication  Reconciliation The principal here is that until we are certain that the  patients are doing what we've asked, it makes no sense to ask them to do more.  -  To keep things simple, I have asked the patient to first separate medicines that are perceived as maintenance, that is to be taken daily "no matter what", from those medicines that are taken on only on an as-needed basis and I have given the patient examples of both, and then return to see our NP to generate a  detailed   medication calendar which should be followed until the next physician sees the patient and updates it.   - see hfa teaching   ? Acid (or non-acid) GERD > always difficult to exclude as up to 75% of pts in some series report no assoc GI/ Heartburn symptoms> rec max (24h)  acid suppression and diet restrictions/ reviewed and instructions given in writing.  - Of the three most common causes of  Sub-acute or recurrent or chronic cough, only one (GERD)  can actually contribute to/ trigger  the other two (asthma and post nasal drip syndrome)  and perpetuate the cylce of cough.  While not intuitively obvious, many patients with chronic low grade reflux do not cough until there is a primary insult that disturbs the protective epithelial barrier and exposes sensitive nerve endings.   This is typically viral but can be direct physical injury such as with an endotracheal tube.   The point is that once this occurs, it is difficult to eliminate the cycle  using anything but a maximally effective acid suppression regimen at least in the short run, accompanied by an appropriate diet to address non acid GERD and control / eliminate the cough itself for at least 3 days > try using tessalon to control cough on max rx for gerd for now plus diet   ? Allergy >  Doubt and says can't take pred due to bp > depomedrol 120 mg IM and change  to 1st gen h1 per guidelines   ? ABPA > ruled  out with IgE 12   ? Anxiety > usually at the bottom of this list of usual suspects but should be much higher on this pt's based on H and P and note already on psychotropics/ may interfere with adherence/ efforts to lose wt > Follow up per Primary Care planned    ? A bunch of PE's  > unlikely on coumadin  ? BB effects > denies taking them but not real sure of meds today   ? CHF > symptoms not worse supine, no chf on cxr but should do bnp on return and maybe echo for ? PH   I had an extended discussion with the patient reviewing all  relevant studies completed to date and  lasting 25 minutes of a 40  minute acute office visit addressing severe non-specific but potentially very serious refractory respiratory symptoms of uncertain and potentially multiple  etiologies.   Each maintenance medication was reviewed in detail including most importantly the difference between maintenance and prns and under what circumstances the prns are to be triggered using an action plan format that is not reflected in the computer generated alphabetically organized AVS.    Please see AVS for specific instructions unique to this office visit that I personally wrote and verbalized to the the pt in detail and then reviewed with pt  by my nurse highlighting any changes in therapy/plan of care  recommended at today's visit.

## 2016-11-16 ENCOUNTER — Other Ambulatory Visit: Payer: Self-pay | Admitting: Cardiology

## 2016-12-09 ENCOUNTER — Encounter: Payer: Medicare HMO | Admitting: Adult Health

## 2017-01-01 ENCOUNTER — Telehealth: Payer: Self-pay | Admitting: Internal Medicine

## 2017-01-01 MED ORDER — BUDESONIDE-FORMOTEROL FUMARATE 80-4.5 MCG/ACT IN AERO: 2.0000 | INHALATION_SPRAY | Freq: Two times a day (BID) | RESPIRATORY_TRACT | 0 refills | Status: DC

## 2017-01-01 NOTE — Telephone Encounter (Signed)
Lm with pt's spouse to have pt return our call. One sample of symbicort has been placed up front for pickup. Pt needs to bring medication formulary to 01/05/17 OV.

## 2017-01-01 NOTE — Telephone Encounter (Signed)
Patient called back - advised that sample is up front and to bring medicine formulary to 01/05/17 visit -pr

## 2017-01-01 NOTE — Telephone Encounter (Signed)
Will close encounter has nothing further is needed.

## 2017-01-05 ENCOUNTER — Encounter: Payer: Medicare HMO | Admitting: Adult Health

## 2017-01-14 ENCOUNTER — Telehealth: Payer: Self-pay | Admitting: Cardiology

## 2017-01-14 NOTE — Telephone Encounter (Signed)
New message   Patient  States she woke up at 4am took diltiazem (CARDIZEM) 120 MG tablet. Took valsartan (DIOVAN) 80 MG tablet about 45 minutes ago.  Patient c/o Palpitations:  High priority if patient c/o lightheadedness, shortness of breath, or chest pain  1) How long have you had palpitations/irregular HR/ Afib? Are you having the symptoms now? All symptoms started about 4 am. Irregular HR ,yes  2) Are you currently experiencing lightheadedness, SOB or CP? no  3) Do you have a history of afib (atrial fibrillation) or irregular heart rhythm? yes  4) Have you checked your BP or HR? (document readings if available): not taken BP, HR 58 this morning  5) Are you experiencing any other symptoms? Feels shaky, headache,upset stomach

## 2017-01-14 NOTE — Telephone Encounter (Signed)
New message    Pt is calling asking if she can take famotidine and another medication before she goes to bed tonight. She said that she is in afib. Please call.

## 2017-01-14 NOTE — Telephone Encounter (Signed)
Left message for patient to call back  

## 2017-01-14 NOTE — Telephone Encounter (Signed)
Patient stated she has been in A. FIB since 4:00 this morning. Patient stated her BP 107/74 HR 64 and BP 101/59 HR 65. Patient stated she is not having any pain or SOB. Consulted Dr. Curt Bears, DOD. He recommend patient be seen in A. FIB clinic either the end of this week or beginning of next week. He recommend patient call our office if her symptoms get worse. Called patient back with recommendations. Patient verbalized understanding. Made an appt with A. FIB clinic on Monday.

## 2017-01-14 NOTE — Telephone Encounter (Signed)
Called patient about her question. Patient was wondering if she can take famotidine with clonazepam. Informed patient there is no drug interaction for these two medications. Patient asked if Clonazepam will cause her HR to drop. Informed patient it could cause her HR to drop. Patient stated she did not think she will take the Clonazepam.

## 2017-01-15 ENCOUNTER — Encounter: Payer: Medicare HMO | Admitting: Adult Health

## 2017-01-15 ENCOUNTER — Telehealth: Payer: Self-pay | Admitting: Cardiology

## 2017-01-15 NOTE — Telephone Encounter (Signed)
°  New Prob  Pt states she is still in A-Fib and requesting to speak to someone. States she has not taken any of her medications this morning.

## 2017-01-15 NOTE — Telephone Encounter (Signed)
Attempted to contact pt by phone but call was sent to vm.  Left message to take medications as listed and to c/b to discuss her concerns.

## 2017-01-15 NOTE — Telephone Encounter (Signed)
Spoke with patient who reports she woke this am around 4 and was in At Fib.  Her BP is low at 89/55 and her HR is 55 bpm.  She reports a "spaciness" feeling from time to time and occasionally almost faint.  She is holding her Diltiazem and Valsartan at this time and will continue to monitor her VS.  She will make sure she is drinking plenty of fluids to avoid dehydration and attempt to increase BP.  She will continue to monitor, call back with any further questions/concerns and keep the appt in the At Fib clinic as scheduled.

## 2017-01-19 ENCOUNTER — Encounter (HOSPITAL_COMMUNITY): Payer: Self-pay | Admitting: Nurse Practitioner

## 2017-01-19 ENCOUNTER — Ambulatory Visit (HOSPITAL_COMMUNITY)
Admission: RE | Admit: 2017-01-19 | Discharge: 2017-01-19 | Disposition: A | Discharge status: Home / Self Care | Payer: Medicare HMO | Source: Ambulatory Visit | Attending: Nurse Practitioner | Admitting: Nurse Practitioner

## 2017-01-19 VITALS — BP 148/84 | HR 53 | Ht 64.0 in | Wt 322.8 lb

## 2017-01-19 DIAGNOSIS — R001 Bradycardia, unspecified: Secondary | ICD-10-CM | POA: Diagnosis not present

## 2017-01-19 DIAGNOSIS — Z79899 Other long term (current) drug therapy: Secondary | ICD-10-CM | POA: Diagnosis not present

## 2017-01-19 DIAGNOSIS — Z9889 Other specified postprocedural states: Secondary | ICD-10-CM | POA: Insufficient documentation

## 2017-01-19 DIAGNOSIS — I10 Essential (primary) hypertension: Secondary | ICD-10-CM | POA: Diagnosis not present

## 2017-01-19 DIAGNOSIS — Z7901 Long term (current) use of anticoagulants: Secondary | ICD-10-CM | POA: Insufficient documentation

## 2017-01-19 DIAGNOSIS — I480 Paroxysmal atrial fibrillation: Secondary | ICD-10-CM | POA: Diagnosis present

## 2017-01-19 LAB — CBC
HCT: 40.1 % (ref 36.0–46.0)
Hemoglobin: 13.6 g/dL (ref 12.0–15.0)
MCH: 29.6 pg (ref 26.0–34.0)
MCHC: 33.9 g/dL (ref 30.0–36.0)
MCV: 87.4 fL (ref 78.0–100.0)
PLATELETS: 220 10*3/uL (ref 150–400)
RBC: 4.59 MIL/uL (ref 3.87–5.11)
RDW: 13.4 % (ref 11.5–15.5)
WBC: 5.8 10*3/uL (ref 4.0–10.5)

## 2017-01-19 LAB — COMPREHENSIVE METABOLIC PANEL
ALT: 28 U/L (ref 14–54)
ANION GAP: 7 (ref 5–15)
AST: 26 U/L (ref 15–41)
Albumin: 3.7 g/dL (ref 3.5–5.0)
Alkaline Phosphatase: 100 U/L (ref 38–126)
BUN: 7 mg/dL (ref 6–20)
CHLORIDE: 104 mmol/L (ref 101–111)
CO2: 29 mmol/L (ref 22–32)
CREATININE: 0.78 mg/dL (ref 0.44–1.00)
Calcium: 8.5 mg/dL — ABNORMAL LOW (ref 8.9–10.3)
Glucose, Bld: 106 mg/dL — ABNORMAL HIGH (ref 65–99)
POTASSIUM: 3.4 mmol/L — AB (ref 3.5–5.1)
SODIUM: 140 mmol/L (ref 135–145)
Total Bilirubin: 0.6 mg/dL (ref 0.3–1.2)
Total Protein: 6.5 g/dL (ref 6.5–8.1)

## 2017-01-19 LAB — TSH: TSH: 1.388 u[IU]/mL (ref 0.350–4.500)

## 2017-01-20 ENCOUNTER — Other Ambulatory Visit: Payer: Self-pay

## 2017-01-20 ENCOUNTER — Encounter: Payer: Self-pay | Admitting: Internal Medicine

## 2017-01-20 MED ORDER — BUDESONIDE-FORMOTEROL FUMARATE 80-4.5 MCG/ACT IN AERO: 2.0000 | INHALATION_SPRAY | Freq: Two times a day (BID) | RESPIRATORY_TRACT | 0 refills | Status: DC

## 2017-01-20 NOTE — Progress Notes (Signed)
Primary Care Physician: Aurea Graff, MD Referring Physician: [facility] triage  Cardiologist: Dr. Ernst Bowler Alexis [name] is a 62 y.o. female with a h/o morbid obesity,HTN, uterine CA,paroxysmal  afib that has had few breakthrough episodes over the years, first diagnosed in  05/2010. She is in the afib clinic for an episode that lasted Wednesday am to Saturday.. She states that while she was in afib heart stayed  in the 40's and her blood pressure was low with sys less than 90. She felt weak. She is in SR today with a HR of 53, off diltiazem and valsartan since Wednesday am. BP now  148/84. She states that her heart rate has persistently stayed slow, but it did increase into the 90's with walking in the clinic.She is limited in her ability to walk long distances because of her weight. No known trigger.[name] does snore, had an inconclusive sleep study in the past as she could not sleep.She states that she does not think she could tolerant a mask anyway. No alcohol or significant caffiene, no tobacco.   Today, she denies symptoms of palpitations, chest pain, shortness of breath, orthopnea, PND, lower extremity edema, dizziness, presyncope, syncope, or neurologic sequela. The patient is tolerating medications without difficulties and is otherwise without complaint today.   Past Medical History:  Diagnosis Date  . Asthma 01/21/2013   Dr. Melvyn Novas  . Endometrial cancer ([place]) 01/21/2013   Dr. Polly Cobia, stage I-10/14  . HTN (hypertension)   . Morbid obesity ([place])   . Paroxysmal A-fib ([place])   . Uterine cancer [facility])    Past Surgical History:  Procedure Laterality Date  . ABLATION    . APPENDECTOMY    . DILATION AND CURETTAGE OF UTERUS    . NASAL SINUS SURGERY      Current Outpatient Prescriptions  Medication Sig Dispense Refill  . albuterol (PROVENTIL HFA;VENTOLIN HFA) 108 (90 Base) MCG/ACT inhaler Inhale 2 puffs into the lungs every 4 (four) hours as needed for wheezing. 1 Inhaler 11  .  budesonide-formoterol (SYMBICORT) 80-4.5 MCG/ACT inhaler Inhale 2 puffs into the lungs 2 (two) times daily. 1 Inhaler 0  . buPROPion (WELLBUTRIN XL) 150 MG 24 hr tablet Take 150 mg by mouth daily. welbutrin    . clonazePAM (KLONOPIN) 1 MG tablet Take 1 mg by mouth 2 (two) times daily.    Marland Kitchen diltiazem (CARDIZEM) 120 MG tablet Take 1 tablet (120 mg total) by mouth daily. 90 tablet 3  . famotidine (PEPCID) 20 MG tablet Take 1 tablet (20 mg total) by mouth at bedtime. 30 tablet 2  . furosemide (LASIX) 40 MG tablet TAKE 1 TABLET (40 MG TOTAL) BY MOUTH DAILY. 30 tablet 0  . HYDROcodone-acetaminophen (NORCO) 7.5-325 MG tablet Take 1 tablet by mouth every 6 (six) hours as needed for moderate pain.    . methocarbamol (ROBAXIN) 500 MG tablet Take 500 mg by mouth as needed for muscle spasms.    . pantoprazole (PROTONIX) 40 MG tablet Take 40 mg by mouth daily.    . valsartan (DIOVAN) 80 MG tablet Take 1 tablet (80 mg total) by mouth daily.    Marland Kitchen warfarin (COUMADIN) 6 MG tablet Take 1 tablet (6 mg total) by mouth as directed.     No current facility-administered medications for this encounter.     Allergies  Allergen Reactions  . Ace Inhibitors     Cough / "congestion"  . Prednisone Other (See Comments)    Raises her blood pressure  . Sulfa Antibiotics  Nausea And Vomiting    Other reaction(s): Nausea And Vomiting  . Sulfamethoxazole Nausea Only    Social History   Social History  . Marital status: Married    Spouse name: N/A  . Number of children: N/A  . Years of education: N/A   Occupational History  . Not on file.   Social History Main Topics  . Smoking status: Never Smoker  . Smokeless tobacco: Never Used  . Alcohol use No  . Drug use: Unknown  . Sexual activity: Not on file   Other Topics Concern  . Not on file   Social History Narrative  . No narrative on file    Family History  Problem Relation Age of Onset  . Heart attack Father   . Hypertension Father   . Diabetes  Father   . Hyperlipidemia Unknown     ROS- All systems are reviewed and negative except as per the HPI above  Physical Exam: Vitals:   01/19/17 1459  BP: (!) 148/84  Pulse: (!) 53  Weight: (!) 322 lb 12.8 oz (146.4 kg)  Height: 5\' 4"  (1.626 m)   Wt Readings from Last 3 Encounters:  01/19/17 (!) 322 lb 12.8 oz (146.4 kg)  11/07/16 (!) 329 lb (149.2 kg)  08/01/16 (!) 335 lb (152 kg)    Labs: Lab Results  Component Value Date   NA 140 01/19/2017   K 3.4 (L) 01/19/2017   CL 104 01/19/2017   CO2 29 01/19/2017   GLUCOSE 106 (H) 01/19/2017   BUN 7 01/19/2017   CREATININE 0.78 01/19/2017   CALCIUM 8.5 (L) 01/19/2017   Lab Results  Component Value Date   INR 1.51 (H) 03/08/2013   No results found for: CHOL, HDL, LDLCALC, TRIG   GEN- The patient is well appearing, alert and oriented x 3 today.   Head- normocephalic, atraumatic Eyes-  Sclera clear, conjunctiva pink Ears- hearing intact Oropharynx- clear Neck- supple, no JVP Lymph- no cervical lymphadenopathy Lungs- Clear to ausculation bilaterally, normal work of breathing Heart- Slow, regular rate and rhythm, no murmurs, rubs or gallops, PMI not laterally displaced GI- soft, NT, ND, + BS Extremities- no clubbing, cyanosis, or edema MS- no significant deformity or atrophy Skin- no rash or lesion Psych- euthymic mood, full affect Neuro- strength and sensation are intact  EKG-Sinus brady at 53 bpm, Pr int 168 ms, qrs int 72 bpm, Qtc 442 ms Epic records reviewed    Assessment and Plan: 1. Paroxysmal afib No obvious trigger   Now back in sinus rhythm but with brady, and brady reported with afib with HR in the 40's Hold off from Cardizem for now  30 day monitor to determine burden, significant bradycardia Echo Cbc, tsh, cmet today  2. HTN Encouraged to resume valsartan now in SR with slightly elevated BP  F/u in 6 weeks for further evaluation  Butch Penny C. Carroll, [facility] [address] [place], [place] [address] [phone]

## 2017-01-20 NOTE — Telephone Encounter (Signed)
Left detailed message with husband advising that I have placed two samples of Symbicort up front for her.

## 2017-01-21 ENCOUNTER — Telehealth (HOSPITAL_COMMUNITY): Payer: Self-pay | Admitting: Nurse Practitioner

## 2017-01-22 ENCOUNTER — Encounter (HOSPITAL_COMMUNITY): Payer: Self-pay | Admitting: *Deleted

## 2017-01-22 NOTE — Telephone Encounter (Signed)
User: Alexis [name] A Date/time: 01/21/17 3:19 PM  Comment: Called pt and lmsg for her to CB to get r/s echo and monitor to an earlier date.   Context:  Outcome: Left Message  Phone number: [phone] Phone Type: Home Phone  Comm. type: Telephone Call type: Outgoing  Contact: [name] Relation to patient: Self

## 2017-01-28 ENCOUNTER — Encounter: Payer: Medicare HMO | Admitting: Adult Health

## 2017-01-29 ENCOUNTER — Other Ambulatory Visit (HOSPITAL_COMMUNITY): Payer: Medicare HMO

## 2017-02-04 ENCOUNTER — Other Ambulatory Visit (HOSPITAL_COMMUNITY): Payer: Medicare HMO

## 2017-02-09 ENCOUNTER — Encounter: Payer: Self-pay | Admitting: Adult Health

## 2017-02-09 ENCOUNTER — Ambulatory Visit (INDEPENDENT_AMBULATORY_CARE_PROVIDER_SITE_OTHER): Payer: Medicare HMO | Admitting: Adult Health

## 2017-02-09 DIAGNOSIS — I480 Paroxysmal atrial fibrillation: Secondary | ICD-10-CM

## 2017-02-09 DIAGNOSIS — J4540 Moderate persistent asthma, uncomplicated: Secondary | ICD-10-CM | POA: Diagnosis not present

## 2017-02-09 NOTE — Assessment & Plan Note (Addendum)
Improved control on Symbicort  Decreased symptoms off ACE inhibitor and GERD control  Patient's medications were reviewed today and patient education was given. Computerized medication calendar was adjusted/completed    Plan  Patient Instructions  Continue on Symbicort 2 puffs Twice daily  , rinse after use.  Follow med calendar closely and bring to each visit .  Symbicort assistance papers done .  Follow up with Dr. Melvyn Novas  In 4 months and As needed

## 2017-02-09 NOTE — Progress Notes (Signed)
Chart and office note reviewed in detail  > agree with a/p as outlined    

## 2017-02-09 NOTE — Progress Notes (Signed)
@Patient  ID: Alexis [name], female    DOB: 1955/04/17, 61 y.o.   MRN: [id number]  Chief Complaint  Patient presents with  . Follow-up    asthma     Referring provider: Aurea Graff, MD  HPI: 61 yo female never smoker followed for asthma  Hx of PAF f/by Cards    TEST /EVents  rec trial off acei 08/24/2013 and again 07/01/2016 x 3 weeks only - Spirometry 07/01/2016  FEV1 1.21 (47%)  Ratio 69 mild curvature  - FENO 07/01/2016  =   39  - Allergy profile 08/01/2016 >  Eos 0.5 /  IgE  12 neg RAST  - 09/28/16 confirmed much better off acei (see email)   02/09/2017 Follow up : Asthma  Pt returns for 3 month follow up . Last visit with increased asthma symptoms with cough and sob. She was given Depo medrol shot . Started on PPI and Pepcid and Chlortrimeton for trigger control.  She was continued on Symbicort. She is feeling better. Cough and breathing are improved. She feels she is much better since off Lisinopril.    Pt has A Fib followed by cards . Notes reviewed with pt having A Fib w/ bradycardia. She has been scheduled for an event monitor and Echo.  This is planned for later this week. . She is on coumadin.     Allergies  Allergen Reactions  . Ace Inhibitors     Cough / "congestion"  . Prednisone Other (See Comments)    Raises her blood pressure  . Sulfa Antibiotics Nausea And Vomiting    Other reaction(s): Nausea And Vomiting  . Sulfamethoxazole Nausea Only    Immunization History  Administered Date(s) Administered  . Influenza Split 03/26/2013, 01/26/2017  . Influenza, Seasonal, Injecte, Preservative Fre 02/21/2015, 02/25/2016  . Influenza,inj,quad, With Preservative 02/25/2016  . Influenza,trivalent, recombinat, inj, PF 02/21/2015  . Pneumococcal Conjugate-13 03/26/2013  . Pneumococcal Polysaccharide-23 02/21/2015  . Pneumococcal-Unspecified 02/21/2015    Past Medical History:  Diagnosis Date  . Asthma 01/21/2013   Dr. Melvyn Novas  . Endometrial cancer ([place])  01/21/2013   Dr. Polly Cobia, stage I-10/14  . HTN (hypertension)   . Morbid obesity ([place])   . Paroxysmal A-fib ([place])   . Uterine cancer ([place])     Tobacco History: Social History   Tobacco Use  Smoking Status Never Smoker  Smokeless Tobacco Never Used   Counseling given: Not Answered   Outpatient Encounter Medications as of 02/09/2017  Medication Sig  . albuterol (PROVENTIL HFA;VENTOLIN HFA) 108 (90 Base) MCG/ACT inhaler Inhale 2 puffs into the lungs every 4 (four) hours as needed for wheezing.  . budesonide-formoterol (SYMBICORT) 80-4.5 MCG/ACT inhaler Inhale 2 puffs into the lungs 2 (two) times daily.  . budesonide-formoterol (SYMBICORT) 80-4.5 MCG/ACT inhaler Inhale 2 puffs into the lungs 2 (two) times daily.  Marland Kitchen buPROPion (WELLBUTRIN XL) 150 MG 24 hr tablet Take 150 mg by mouth daily. welbutrin  . clonazePAM (KLONOPIN) 1 MG tablet Take 1 mg by mouth 2 (two) times daily.  Marland Kitchen diltiazem (CARDIZEM) 120 MG tablet Take 1 tablet (120 mg total) by mouth daily.  . famotidine (PEPCID) 20 MG tablet Take 1 tablet (20 mg total) by mouth at bedtime.  . furosemide (LASIX) 40 MG tablet TAKE 1 TABLET (40 MG TOTAL) BY MOUTH DAILY.  Marland Kitchen HYDROcodone-acetaminophen (NORCO) 7.5-325 MG tablet Take 1 tablet by mouth every 6 (six) hours as needed for moderate pain.  . methocarbamol (ROBAXIN) 500 MG tablet Take 500 mg by mouth as  needed for muscle spasms.  . pantoprazole (PROTONIX) 40 MG tablet Take 40 mg by mouth daily.  . valsartan (DIOVAN) 80 MG tablet Take 1 tablet (80 mg total) by mouth daily.  Marland Kitchen warfarin (COUMADIN) 6 MG tablet Take 1 tablet (6 mg total) by mouth as directed.   No facility-administered encounter medications on file as of 02/09/2017.      Review of Systems  Constitutional:   No  weight loss, night sweats,  Fevers, chills,  +fatigue, or  lassitude.  HEENT:   No headaches,  Difficulty swallowing,  Tooth/dental problems, or  Sore throat,                No sneezing, itching, ear ache,  nasal congestion, post nasal drip,   CV:  No chest pain,  Orthopnea, PND, swelling in lower extremities, anasarca, dizziness, palpitations, syncope.   GI  No heartburn, indigestion, abdominal pain, nausea, vomiting, diarrhea, change in bowel habits, loss of appetite, bloody stools.   Resp:    No chest wall deformity  Skin: no rash or lesions.  GU: no dysuria, change in color of urine, no urgency or frequency.  No flank pain, no hematuria   MS:  No joint pain or swelling.  No decreased range of motion.  No back pain.    Physical Exam  BP 124/84 (BP Location: Left Arm, Cuff Size: Large)   Pulse 67   Ht 5\' 4"  (1.626 m)   Wt (!) 324 lb 12.8 oz (147.3 kg)   SpO2 98%   BMI 55.75 kg/m   GEN: A/Ox3; pleasant , NAD, obese    HEENT:  [place]/AT,  EACs-clear, TMs-wnl, NOSE-clear, THROAT-clear, no lesions, no postnasal drip or exudate noted.   NECK:  Supple w/ fair ROM; no JVD; normal carotid impulses w/o bruits; no thyromegaly or nodules palpated; no lymphadenopathy.    RESP  Clear  P & A; w/o, wheezes/ rales/ or rhonchi. no accessory muscle use, no dullness to percussion  CARD:  RRR, no m/r/g, tr -1 + peripheral edema, pulses intact, no cyanosis or clubbing.  GI:   Soft & nt; nml bowel sounds; no organomegaly or masses detected.   Musco: Warm bil, no deformities or joint swelling noted.   Neuro: alert, no focal deficits noted.    Skin: Warm, no lesions or rashes    Lab Results:  CBC  BNP  Imaging: No results found.   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  Follow med calendar closely and bring to each visit .  Symbicort assistance papers done .  Follow up with Dr. Melvyn Novas  In 4 months and As needed          Rexene Edison,  NP 02/09/2017

## 2017-02-09 NOTE — Patient Instructions (Addendum)
Continue on Symbicort 2 puffs Twice daily  , rinse after use.  Follow med calendar closely and bring to each visit .  Symbicort assistance papers done .  Follow up with Dr. Melvyn Novas  In 4 months and As needed

## 2017-02-09 NOTE — Assessment & Plan Note (Signed)
Cont follow up with Cards this week as planned

## 2017-02-11 ENCOUNTER — Other Ambulatory Visit: Payer: Self-pay

## 2017-02-11 ENCOUNTER — Ambulatory Visit (HOSPITAL_COMMUNITY): Payer: Medicare HMO | Attending: Nurse Practitioner

## 2017-02-11 ENCOUNTER — Ambulatory Visit (INDEPENDENT_AMBULATORY_CARE_PROVIDER_SITE_OTHER): Payer: Medicare HMO

## 2017-02-11 DIAGNOSIS — I10 Essential (primary) hypertension: Secondary | ICD-10-CM | POA: Insufficient documentation

## 2017-02-11 DIAGNOSIS — J45909 Unspecified asthma, uncomplicated: Secondary | ICD-10-CM | POA: Insufficient documentation

## 2017-02-11 DIAGNOSIS — R002 Palpitations: Secondary | ICD-10-CM | POA: Diagnosis not present

## 2017-02-11 DIAGNOSIS — Z8249 Family history of ischemic heart disease and other diseases of the circulatory system: Secondary | ICD-10-CM | POA: Insufficient documentation

## 2017-02-11 DIAGNOSIS — I480 Paroxysmal atrial fibrillation: Secondary | ICD-10-CM | POA: Diagnosis not present

## 2017-02-11 DIAGNOSIS — I4891 Unspecified atrial fibrillation: Secondary | ICD-10-CM | POA: Insufficient documentation

## 2017-02-11 MED ORDER — PERFLUTREN LIPID MICROSPHERE
1.0000 mL | INTRAVENOUS | Status: AC | PRN
  Administered 2017-02-11: 2 mL via INTRAVENOUS

## 2017-02-13 MED ORDER — BUDESONIDE-FORMOTEROL FUMARATE 80-4.5 MCG/ACT IN AERO: 2.0000 | INHALATION_SPRAY | Freq: Two times a day (BID) | RESPIRATORY_TRACT | 0 refills | Status: DC

## 2017-02-13 NOTE — Addendum Note (Signed)
Addended by: Parke Poisson E on: 02/13/2017 02:33 PM   Modules accepted: Orders

## 2017-02-23 NOTE — Addendum Note (Signed)
Addended by: Desmond Dike C on: 02/23/2017 03:11 PM   Modules accepted: Orders

## 2017-03-04 ENCOUNTER — Ambulatory Visit (HOSPITAL_COMMUNITY): Payer: Medicare HMO | Admitting: Nurse Practitioner

## 2017-03-17 ENCOUNTER — Ambulatory Visit (HOSPITAL_COMMUNITY): Payer: Medicare HMO | Admitting: Nurse Practitioner

## 2017-03-18 ENCOUNTER — Telehealth: Payer: Self-pay | Admitting: Cardiology

## 2017-03-18 NOTE — Telephone Encounter (Signed)
Spoke with patient about her message. Patient stated she is in A. FIB right now. Patient has been wearing a monitor for 30 days. Patient stated she put the monitor back on this morning while she was having the A. Fib episode.  Patient stated she took her diltiazem at that time, and her A. FIB was somewhat better. Informed patient that her monitor results would be reviewed and we could call her back. Spoke with monitor tech Lanare, and patient's monitor stopped on 03/13/17, per 30 days. Per Monitor, Patient had no A. FIB episodes, which patient had stated she did not think she had any A. FIB while she was wearing the monitor. Patient's BP at this time is 130/74. Patient does not know what her heart rate is at this time.  Dr. [name] or Roderic Palau NP will be happy to see the patient tomorrow. Called patient back, left message for patient to call our office.

## 2017-03-18 NOTE — Telephone Encounter (Signed)
Alexis [name] is calling because she just came off of a 30 day Event Monitor , during the time she was wearing the monitor she didn't have a Afib episode , but this morning she woke up and she is in AFIB . Wants to know do she remove the heart monitor so that this episode can be recorded . Please call

## 2017-03-18 NOTE — Telephone Encounter (Signed)
Called patient again. Patient is able to come in tomorrow to see Roderic Palau NP.

## 2017-03-19 ENCOUNTER — Telehealth: Payer: Self-pay | Admitting: Internal Medicine

## 2017-03-19 ENCOUNTER — Ambulatory Visit (HOSPITAL_COMMUNITY)
Admission: RE | Admit: 2017-03-19 | Discharge: 2017-03-19 | Disposition: A | Discharge status: Home / Self Care | Payer: Medicare HMO | Source: Ambulatory Visit | Attending: Nurse Practitioner | Admitting: Nurse Practitioner

## 2017-03-19 ENCOUNTER — Encounter (HOSPITAL_COMMUNITY): Payer: Self-pay | Admitting: Nurse Practitioner

## 2017-03-19 VITALS — BP 136/8 | HR 70 | Ht 64.0 in | Wt 327.0 lb

## 2017-03-19 DIAGNOSIS — Z7901 Long term (current) use of anticoagulants: Secondary | ICD-10-CM | POA: Insufficient documentation

## 2017-03-19 DIAGNOSIS — I10 Essential (primary) hypertension: Secondary | ICD-10-CM | POA: Insufficient documentation

## 2017-03-19 DIAGNOSIS — I480 Paroxysmal atrial fibrillation: Secondary | ICD-10-CM | POA: Diagnosis present

## 2017-03-19 DIAGNOSIS — Z888 Allergy status to other drugs, medicaments and biological substances status: Secondary | ICD-10-CM | POA: Insufficient documentation

## 2017-03-19 DIAGNOSIS — Z8542 Personal history of malignant neoplasm of other parts of uterus: Secondary | ICD-10-CM | POA: Insufficient documentation

## 2017-03-19 DIAGNOSIS — Z79899 Other long term (current) drug therapy: Secondary | ICD-10-CM | POA: Diagnosis not present

## 2017-03-19 MED ORDER — DILTIAZEM HCL ER COATED BEADS 120 MG PO CP24: 120.0000 mg | ORAL_CAPSULE | Freq: Every day | ORAL | 6 refills | Status: DC

## 2017-03-19 MED ORDER — BUDESONIDE-FORMOTEROL FUMARATE 80-4.5 MCG/ACT IN AERO: 2.0000 | INHALATION_SPRAY | Freq: Two times a day (BID) | RESPIRATORY_TRACT | 0 refills | Status: DC

## 2017-03-19 MED ORDER — DILTIAZEM HCL 120 MG PO TABS: 60.0000 mg | ORAL_TABLET | ORAL | 3 refills | Status: DC | PRN

## 2017-03-19 NOTE — Patient Instructions (Signed)
Your physician has recommended you make the following change in your medication:  1)Start Cardizem 120mg  once a day at bedtime. 2)Cardizem 60mg  (1/2 of the 120mg  regular cardizem tab) -- take 1/2 tablet every 4 hours AS NEEDED for AFIB heart rate over 100 as long as blood pressure >100.

## 2017-03-19 NOTE — Telephone Encounter (Signed)
Patient left documents to complete patient assistance paperwork for (Symbicort) and patient would like Albuterol to be added for assistance, left copies of tax/insurance documents at [facility] folder at the front check out area.Pt contact # [phone]...ert

## 2017-03-19 NOTE — Telephone Encounter (Signed)
Have one sample of symb. 80 that I have gotten ready for pt. Nothing further needed.

## 2017-03-19 NOTE — Telephone Encounter (Signed)
Routing this message to [place] for her to follow up on.

## 2017-03-20 NOTE — Progress Notes (Signed)
Primary Care Physician: Aurea Graff, MD Referring Physician: [facility] triage  Cardiologist: Dr. Ernst Bowler Alexis [name] is a 61 y.o. female with a h/o morbid obesity,HTN, uterine CA,paroxysmal  afib that has had few breakthrough episodes over the years, first diagnosed in  05/2010. She is in the afib clinic for an episode that lasted Wednesday am to Saturday. She states that while she was in afib heart stayed  in the 40's and her blood pressure was low with sys less than 90. She felt weak. She is in SR today with a HR of 53, off diltiazem and valsartan since Wednesday am. BP now  148/84. She states that her heart rate has persistently stayed slow, but it did increase into the 90's with walking in the clinic.She is limited in her ability to walk long distances because of her weight. No known trigger.She does snore, had an inconclusive sleep study in the past as she could not sleep.She states that she does not think she could tolerant a mask anyway. No alcohol or significant caffiene, no tobacco.   F/u on afib clinic 12/13. SHe wore an event monitor for one month and did not have any afib noted. Some bray, sone tachy but no significant findings, arrhythmias. She  does think she had afib for a couple of hours the pm of the day that she stopped wearing the monitor. It is found out today that she is taking short acting 120 mg cardizem q am and may not control heart rhythm issues in the pm. She did note that while wearing the monitor that she had some irregular heart beat but is was not associated with an abnormal rhythm.  Today, she denies symptoms of   chest pain, shortness of breath, orthopnea, PND, lower extremity edema, dizziness, presyncope, syncope, or neurologic sequela. The patient is tolerating medications without difficulties and is otherwise without complaint today.   Past Medical History:  Diagnosis Date  . Asthma 01/21/2013   Dr. Melvyn Novas  . Endometrial cancer ([place]) 01/21/2013   Dr.  Polly Cobia, stage I-10/14  . HTN (hypertension)   . Morbid obesity ([place])   . Paroxysmal A-fib ([place])   . Uterine cancer [facility])    Past Surgical History:  Procedure Laterality Date  . ABLATION    . APPENDECTOMY    . DILATION AND CURETTAGE OF UTERUS    . NASAL SINUS SURGERY      Current Outpatient Medications  Medication Sig Dispense Refill  . budesonide-formoterol (SYMBICORT) 80-4.5 MCG/ACT inhaler Inhale 2 puffs 2 (two) times daily into the lungs. 1 Inhaler 0  . diltiazem (CARDIZEM) 120 MG tablet Take 0.5 tablets (60 mg total) by mouth as needed (for breakthrough afib). 90 tablet 3  . famotidine (PEPCID) 20 MG tablet Take 1 tablet (20 mg total) by mouth at bedtime. 30 tablet 2  . fluticasone (FLONASE) 50 MCG/ACT nasal spray Place 2 sprays daily as needed into both nostrils for allergies or rhinitis.    . pantoprazole (PROTONIX) 40 MG tablet Take 40 mg by mouth daily.    . valsartan (DIOVAN) 80 MG tablet Take 1 tablet (80 mg total) by mouth daily. (Patient taking differently: Take 80 mg by mouth daily as needed. )    . warfarin (COUMADIN) 6 MG tablet Take 1 tablet (6 mg total) by mouth as directed.    Marland Kitchen albuterol (PROVENTIL HFA;VENTOLIN HFA) 108 (90 Base) MCG/ACT inhaler Inhale 2 puffs into the lungs every 4 (four) hours as needed for wheezing. (Patient not taking:  Reported on 03/19/2017) 1 Inhaler 11  . budesonide-formoterol (SYMBICORT) 80-4.5 MCG/ACT inhaler Inhale 2 puffs into the lungs 2 (two) times daily. 1 Inhaler 0  . diltiazem (CARDIZEM CD) 120 MG 24 hr capsule Take 1 capsule (120 mg total) by mouth daily. 30 capsule 6   No current facility-administered medications for this encounter.     Allergies  Allergen Reactions  . Ace Inhibitors     Cough / "congestion"  . Prednisone Other (See Comments)    Raises her blood pressure  . Sulfa Antibiotics Nausea And Vomiting    Other reaction(s): Nausea And Vomiting  . Sulfamethoxazole Nausea Only    Social History   Socioeconomic  History  . Marital status: Married    Spouse name: Not on file  . Number of children: Not on file  . Years of education: Not on file  . Highest education level: Not on file  Social Needs  . Financial resource strain: Not on file  . Food insecurity - worry: Not on file  . Food insecurity - inability: Not on file  . Transportation needs - medical: Not on file  . Transportation needs - non-medical: Not on file  Occupational History  . Not on file  Tobacco Use  . Smoking status: Never Smoker  . Smokeless tobacco: Never Used  Substance and Sexual Activity  . Alcohol use: No  . Drug use: Not on file  . Sexual activity: Not on file  Other Topics Concern  . Not on file  Social History Narrative  . Not on file    Family History  Problem Relation Age of Onset  . Heart attack Father   . Hypertension Father   . Diabetes Father   . Hyperlipidemia Unknown     ROS- All systems are reviewed and negative except as per the HPI above  Physical Exam: Vitals:   03/19/17 1428  BP: (!) 136/8  Pulse: 70  Weight: (!) 327 lb (148.3 kg)  Height: 5\' 4"  (1.626 m)   Wt Readings from Last 3 Encounters:  03/19/17 (!) 327 lb (148.3 kg)  02/09/17 (!) 324 lb 12.8 oz (147.3 kg)  01/19/17 (!) 322 lb 12.8 oz (146.4 kg)    Labs: Lab Results  Component Value Date   NA 140 01/19/2017   K 3.4 (L) 01/19/2017   CL 104 01/19/2017   CO2 29 01/19/2017   GLUCOSE 106 (H) 01/19/2017   BUN 7 01/19/2017   CREATININE 0.78 01/19/2017   CALCIUM 8.5 (L) 01/19/2017   Lab Results  Component Value Date   INR 1.51 (H) 03/08/2013   No results found for: CHOL, HDL, LDLCALC, TRIG   GEN- The patient is well appearing, alert and oriented x 3 today.   Head- normocephalic, atraumatic Eyes-  Sclera clear, conjunctiva pink Ears- hearing intact Oropharynx- clear Neck- supple, no JVP Lymph- no cervical lymphadenopathy Lungs- Clear to ausculation bilaterally, normal work of breathing Heart- Slow, regular rate  and rhythm, no murmurs, rubs or gallops, PMI not laterally displaced GI- soft, NT, ND, + BS Extremities- no clubbing, cyanosis, or edema MS- no significant deformity or atrophy Skin- no rash or lesion Psych- euthymic mood, full affect Neuro- strength and sensation are intact  EKG-Sinus brady at 53 bpm, Pr int 168 ms, qrs int 72 bpm, Qtc 442 ms Epic records reviewed Echo-Study Conclusions  - Left ventricle: The cavity size was normal. Systolic function was   normal. The estimated ejection fraction was in the range of 55%  to 60%. Wall motion was normal; there were no regional wall   motion abnormalities. Left ventricular diastolic function   parameters were normal. - Atrial septum: There was increased thickness of the septum,   consistent with lipomatous hypertrophy. No defect or patent   foramen ovale was identified.   Assessment and Plan: 1. Paroxysmal afib Recent event monitor worn x one month did not show any afib by data provided, or other significant arrythmia, but has not been over read by MD yet. She is on short acting 120 mg cardizem daily and this will be changed to 120 mg CD to better cover any arrhythmia in the pm.   2. HTN  stable   F/u in one month at which time will review with pt afib episodes, I have asked her to keep a record of episodes of any afib she may experience, hopefully changing the formulation of cardizem from SA to CD will help cover pt better At this time she defers an antiarrythmic and per monitor her afib burden appears low at this time  Butch Penny C. Warnell Rasnic, [facility] [address] [place], [place] [address] [phone]

## 2017-03-20 NOTE — Telephone Encounter (Signed)
LMTCB

## 2017-03-20 NOTE — Telephone Encounter (Signed)
She dropped off the insurance card and tax information  She never filled out the form and signed in though  Does she want to pick one up? LMTCB

## 2017-03-23 ENCOUNTER — Ambulatory Visit (HOSPITAL_COMMUNITY): Payer: Medicare HMO | Admitting: Nurse Practitioner

## 2017-03-23 NOTE — Telephone Encounter (Signed)
Called pt, call went straight to voicemail. lmtcb x1 for pt.

## 2017-03-23 NOTE — Telephone Encounter (Signed)
Pt calling back about patient assistance form..-tr

## 2017-03-24 NOTE — Telephone Encounter (Signed)
Called pt, call went straight to voicemail. lmtcb x2 for pt.

## 2017-03-25 NOTE — Telephone Encounter (Signed)
Called pt, call went straight to voicemail. lmtcb x3 for pt.

## 2017-03-26 ENCOUNTER — Telehealth: Payer: Self-pay | Admitting: Internal Medicine

## 2017-03-26 NOTE — Telephone Encounter (Signed)
We have attempted to contact the pt several times with no success or call back from the pt. Per triage protocol, message will be closed.  

## 2017-03-26 NOTE — Telephone Encounter (Signed)
ATC pt, no answer. Left message for pt to call back.   Rosana Berger, CMA    9:00 AM  Note    She dropped off the insurance card and tax information  She never filled out the form and signed in though  Does she want to pick one up? LMTCB

## 2017-03-27 NOTE — Telephone Encounter (Signed)
Spoke with pt in the lobby and had her sign the application. Pt also wanted to see if Dr. Melvyn Novas can increase her Symbicort from 80 to 160 because it doesn't seem to be working due to increased SOB. MW please advise

## 2017-03-27 NOTE — Telephone Encounter (Signed)
ATC pt, no answer. Left message for pt to call back.  

## 2017-03-27 NOTE — Telephone Encounter (Signed)
Fine with me

## 2017-03-27 NOTE — Telephone Encounter (Signed)
Patient is waiting in lobby to speak with nurse about patient assistance paperwork;

## 2017-03-27 NOTE — Telephone Encounter (Signed)
Ok I will let pt know. I have paperwork for patient assistance for Symbicort and Ventolin. I will work on this and fax.

## 2017-03-30 MED ORDER — ALBUTEROL SULFATE HFA 108 (90 BASE) MCG/ACT IN AERS: 2.0000 | INHALATION_SPRAY | RESPIRATORY_TRACT | 3 refills | Status: DC | PRN

## 2017-03-30 MED ORDER — BUDESONIDE-FORMOTEROL FUMARATE 80-4.5 MCG/ACT IN AERO: 2.0000 | INHALATION_SPRAY | Freq: Two times a day (BID) | RESPIRATORY_TRACT | 3 refills | Status: DC

## 2017-03-30 NOTE — Telephone Encounter (Signed)
Jonelle Sidle, please advise if you were able to fax paperwork for patient assistance for Symbicort and if you have an update on status.

## 2017-03-30 NOTE — Telephone Encounter (Signed)
I have the paperwork it was faxed today.

## 2017-04-01 NOTE — Telephone Encounter (Signed)
I also left a copy in SG cubby, just in case we need them. I have an application for the Ventolin and Symbicort from two different companies. FYI

## 2017-04-15 ENCOUNTER — Telehealth: Payer: Self-pay | Admitting: Internal Medicine

## 2017-04-15 MED ORDER — BUDESONIDE-FORMOTEROL FUMARATE 160-4.5 MCG/ACT IN AERO: 2.0000 | INHALATION_SPRAY | Freq: Two times a day (BID) | RESPIRATORY_TRACT | 11 refills | Status: DC

## 2017-04-15 NOTE — Telephone Encounter (Signed)
Pt is requesting symb 160, as she feels this strength is more effective.  MW please advise if okay to send symb 160 to [facility]? Thanks.

## 2017-04-15 NOTE — Telephone Encounter (Signed)
Sym 80 2bid but make sure has appt before first month is used up to be sure this is what she really needs   When comes in bring all meds

## 2017-04-15 NOTE — Telephone Encounter (Signed)
Called and spoke with pt.  Pt states she received a letter from [facility], stating that medication had not been selected on application.  Called and spoke to Elysian with [facility]. Neoma Laming states a new Rx with DEA number is needed, as they received a Rx for symb 160 & symb 80. Neoma Laming also states pt will need to call with her SS number, as I was not provided.  I have made pt aware of this.  Deborah request that fax cover sheet with Rx include pt's ID number. I have provided fax number and pt ID below.   MW please advise on Rx. symb 160 or 80?  Pt's ID number is [id number] [place] fax number: [phone]

## 2017-04-15 NOTE — Telephone Encounter (Signed)
The 160 is fine but again make sure she has f/u before next refill if possible

## 2017-04-15 NOTE — Telephone Encounter (Signed)
rx printed and placed in MW's look-at for signature.   Routing to LR to follow up on rx at her request.

## 2017-04-16 NOTE — Telephone Encounter (Signed)
rx was faxed to [facility] and me with cover sheet including her id number

## 2017-04-21 ENCOUNTER — Telehealth: Payer: Self-pay | Admitting: Internal Medicine

## 2017-04-21 NOTE — Telephone Encounter (Signed)
Pt would like to verify that the paper work for her assistance was sent in again Advised that it was faxed in on 03/26/2017 and again on 04/15/2017 Pt asked if MW approved pt to be placed back on Symbicort 160 not on Symbicort 80   Pt reports that she is having difficulties breathing, she feels breathless with standing up to walk Pt has productive cough-white thick mucus, increase in SOB and wheezing with exertion  Offered patient OV tomorrow with MW at 3:45pm, patient will keep appt at this time Advised if symptoms worsen to seek urgent or ED immediately.   Routing message to [place] for review

## 2017-04-22 ENCOUNTER — Ambulatory Visit: Payer: Medicare HMO | Admitting: Internal Medicine

## 2017-04-22 ENCOUNTER — Encounter: Payer: Self-pay | Admitting: Internal Medicine

## 2017-04-22 ENCOUNTER — Ambulatory Visit (HOSPITAL_COMMUNITY)
Admission: RE | Admit: 2017-04-22 | Discharge: 2017-04-22 | Disposition: A | Discharge status: Home / Self Care | Payer: Medicare HMO | Source: Ambulatory Visit | Attending: Nurse Practitioner | Admitting: Nurse Practitioner

## 2017-04-22 VITALS — BP 122/76 | HR 69 | Ht 64.0 in | Wt 324.6 lb

## 2017-04-22 VITALS — BP 112/76 | HR 56 | Ht 65.0 in | Wt 324.0 lb

## 2017-04-22 DIAGNOSIS — I480 Paroxysmal atrial fibrillation: Secondary | ICD-10-CM | POA: Insufficient documentation

## 2017-04-22 DIAGNOSIS — Z7901 Long term (current) use of anticoagulants: Secondary | ICD-10-CM | POA: Insufficient documentation

## 2017-04-22 DIAGNOSIS — I10 Essential (primary) hypertension: Secondary | ICD-10-CM | POA: Insufficient documentation

## 2017-04-22 DIAGNOSIS — Z8542 Personal history of malignant neoplasm of other parts of uterus: Secondary | ICD-10-CM | POA: Insufficient documentation

## 2017-04-22 DIAGNOSIS — J4540 Moderate persistent asthma, uncomplicated: Secondary | ICD-10-CM

## 2017-04-22 DIAGNOSIS — Z79899 Other long term (current) drug therapy: Secondary | ICD-10-CM | POA: Insufficient documentation

## 2017-04-22 DIAGNOSIS — Z6841 Body Mass Index (BMI) 40.0 and over, adult: Secondary | ICD-10-CM | POA: Diagnosis not present

## 2017-04-22 DIAGNOSIS — J45909 Unspecified asthma, uncomplicated: Secondary | ICD-10-CM | POA: Diagnosis not present

## 2017-04-22 MED ORDER — PREDNISONE 10 MG PO TABS
ORAL_TABLET | ORAL | 0 refills | Status: DC
Start: 2017-04-22 — End: 2017-11-13

## 2017-04-22 MED ORDER — BUDESONIDE-FORMOTEROL FUMARATE 160-4.5 MCG/ACT IN AERO: 2.0000 | INHALATION_SPRAY | Freq: Two times a day (BID) | RESPIRATORY_TRACT | 3 refills | Status: DC

## 2017-04-22 MED ORDER — MOMETASONE FURO-FORMOTEROL FUM 200-5 MCG/ACT IN AERO: 2.0000 | INHALATION_SPRAY | Freq: Two times a day (BID) | RESPIRATORY_TRACT | 0 refills | Status: DC

## 2017-04-22 MED ORDER — RABEPRAZOLE SODIUM 20 MG PO TBEC: 20.0000 mg | DELAYED_RELEASE_TABLET | Freq: Every day | ORAL | 2 refills | Status: DC

## 2017-04-22 NOTE — Patient Instructions (Addendum)
GERD (REFLUX)  is an extremely common cause of respiratory symptoms just like yours , many times with no obvious heartburn at all.    It can be treated with medication, but also with lifestyle changes including elevation of the head of your bed (ideally with 6 inch  bed blocks),  Smoking cessation, avoidance of late meals, excessive alcohol, and avoid fatty foods, chocolate, peppermint, colas, red wine, and acidic juices such as orange juice.  NO MINT OR MENTHOL PRODUCTS SO NO COUGH DROPS   USE SUGARLESS CANDY INSTEAD (Jolley ranchers or Stover's or Life Savers) or even ice chips will also do - the key is to swallow to prevent all throat clearing. NO OIL BASED VITAMINS - use powdered substitutes.  Stop protonix and start aciphex 20 mg Take 30- 60 min before your first and last meals of the day   Change symbicort to 160 = dulera 200 Take 2 puffs first thing in am and then another 2 puffs about 12 hours later.   Only use your albuterol (Ventolin) as a rescue medication to be used if you can't catch your breath by resting or doing a relaxed purse lip breathing pattern.  - The less you use it, the better it will work when you need it. - Ok to use up to 2 puffs  every 4 hours if you must but call for immediate appointment if use goes up over your usual need - Don't leave home without it !!  (think of it like the spare tire for your car)   If all else fails > Prednisone 10 mg take  4 each am x 2 days,   2 each am x 2 days,  1 each am x 2 days and stop

## 2017-04-22 NOTE — Progress Notes (Signed)
Primary Care Physician: Aurea Graff, MD Referring Physician: [facility] triage  Cardiologist: Dr. Ernst Bowler Alexis [name] is a 62 y.o. female with a h/o  obesity,HTN, uterine CA,paroxysmal  afib that has had few breakthrough episodes over the years, first diagnosed in  05/2010. She is in the afib clinic for an episode that  lasted for several   days. She states that while she was in afib heart stayed  in the 40's and her blood pressure was low with sys less than 90. She felt weak. She is in SR today with a HR of 53, off diltiazem and valsartan since Wednesday am. BP now  148/84. She states that her heart rate has persistently stayed slow, but it did increase into the 90's with walking in the clinic.She is limited in her ability to walk long distances. No known trigger.She does snore, had an inconclusive sleep study in the past as she could not sleep.She states that she does not think she could tolerant a mask anyway. No alcohol or significant caffiene, no tobacco.   She wore an event monitor which did not show any afib.. It was found that she was taking short acting daily Cardizem and changed to long acting Cardizem as she was noticing more palpitations in the late evening. She reports today that this has helped with irregular heart beat, she has not noted lately. She recently had a change in her BP pill and this has been helpful as well to smooth out BP readings. She is planning to see her pulmonologist as she feels her asthma is worse lately. She is also is planning to move to [place] in February to help take care of her mother.  Today, she denies symptoms of palpitations, chest pain,  orthopnea, PND, lower extremity edema, dizziness, presyncope, syncope, or neurologic sequela. The patient is tolerating medications without difficulties and is otherwise without complaint today.   Past Medical History:  Diagnosis Date  . Asthma 01/21/2013   Dr. Melvyn Novas  . Endometrial cancer ([place]) 01/21/2013   Dr. Polly Cobia, stage I-10/14  . HTN (hypertension)   . Morbid obesity ([place])   . Paroxysmal A-fib ([place])   . Uterine cancer [facility])    Past Surgical History:  Procedure Laterality Date  . ABLATION    . APPENDECTOMY    . DILATION AND CURETTAGE OF UTERUS    . NASAL SINUS SURGERY      Current Outpatient Medications  Medication Sig Dispense Refill  . albuterol (PROVENTIL HFA;VENTOLIN HFA) 108 (90 Base) MCG/ACT inhaler Inhale 2 puffs into the lungs every 4 (four) hours as needed for wheezing. 3 Inhaler 3  . buPROPion (WELLBUTRIN XL) 300 MG 24 hr tablet Take 1 tablet by mouth.    . diltiazem (CARDIZEM CD) 120 MG 24 hr capsule Take 1 capsule (120 mg total) by mouth daily. 30 capsule 6  . famotidine (PEPCID) 20 MG tablet Take 1 tablet (20 mg total) by mouth at bedtime. 30 tablet 2  . fluticasone (FLONASE) 50 MCG/ACT nasal spray Place 2 sprays daily as needed into both nostrils for allergies or rhinitis.    Marland Kitchen lansoprazole (PREVACID) 30 MG capsule Take 30 mg by mouth daily at 12 noon.    Marland Kitchen losartan (COZAAR) 100 MG tablet Take 100 mg by mouth daily.    Marland Kitchen warfarin (COUMADIN) 6 MG tablet Take 1 tablet (6 mg total) by mouth as directed.    Marland Kitchen acetaminophen (TYLENOL) 325 MG tablet Take 650 mg by mouth every 6 (six)  hours as needed.    . budesonide-formoterol (SYMBICORT) 80-4.5 MCG/ACT inhaler Inhale 2 puffs into the lungs 2 (two) times daily.    . clonazePAM (KLONOPIN) 1 MG tablet Take 0.5 tablets by mouth 2 (two) times daily as needed.    . diltiazem (CARDIZEM) 120 MG tablet Take 0.5 tablets (60 mg total) by mouth as needed (for breakthrough afib). 90 tablet 3  . HYDROcodone-acetaminophen (NORCO) 10-325 MG tablet Take 1 tablet by mouth every 6 (six) hours as needed.     No current facility-administered medications for this encounter.     Allergies  Allergen Reactions  . Ace Inhibitors     Cough / "congestion"  . Prednisone Other (See Comments)    Raises her blood pressure  . Sulfa Antibiotics  Nausea And Vomiting    Other reaction(s): Nausea And Vomiting  . Sulfamethoxazole Nausea Only    Social History   Socioeconomic History  . Marital status: Married    Spouse name: Not on file  . Number of children: Not on file  . Years of education: Not on file  . Highest education level: Not on file  Social Needs  . Financial resource strain: Not on file  . Food insecurity - worry: Not on file  . Food insecurity - inability: Not on file  . Transportation needs - medical: Not on file  . Transportation needs - non-medical: Not on file  Occupational History  . Not on file  Tobacco Use  . Smoking status: Never Smoker  . Smokeless tobacco: Never Used  Substance and Sexual Activity  . Alcohol use: No  . Drug use: Not on file  . Sexual activity: Not on file  Other Topics Concern  . Not on file  Social History Narrative  . Not on file    Family History  Problem Relation Age of Onset  . Heart attack Father   . Hypertension Father   . Diabetes Father   . Hyperlipidemia Unknown     ROS- All systems are reviewed and negative except as per the HPI above  Physical Exam: Vitals:   04/22/17 1419  BP: 122/76  Pulse: 69  Weight: (!) 324 lb 9.6 oz (147.2 kg)  Height: 5\' 4"  (1.626 m)   Wt Readings from Last 3 Encounters:  04/22/17 (!) 324 lb (147 kg)  04/22/17 (!) 324 lb 9.6 oz (147.2 kg)  03/19/17 (!) 327 lb (148.3 kg)    Labs: Lab Results  Component Value Date   NA 140 01/19/2017   K 3.4 (L) 01/19/2017   CL 104 01/19/2017   CO2 29 01/19/2017   GLUCOSE 106 (H) 01/19/2017   BUN 7 01/19/2017   CREATININE 0.78 01/19/2017   CALCIUM 8.5 (L) 01/19/2017   Lab Results  Component Value Date   INR 1.51 (H) 03/08/2013   No results found for: CHOL, HDL, LDLCALC, TRIG   GEN- The patient is well appearing, alert and oriented x 3 today.   Head- normocephalic, atraumatic Eyes-  Sclera clear, conjunctiva pink Ears- hearing intact Oropharynx- clear Neck- supple, no  JVP Lymph- no cervical lymphadenopathy Lungs- Clear to ausculation bilaterally, normal work of breathing Heart- Regular rate and rhythm, no murmurs, rubs or gallops, PMI not laterally displaced GI- soft, NT, ND, + BS Extremities- no clubbing, cyanosis, or edema MS- no significant deformity or atrophy Skin- no rash or lesion Psych- euthymic mood, full affect Neuro- strength and sensation are intact  EKG-Sinus brady at 53 bpm, Pr int 168 ms, qrs int  72 bpm, Qtc 442 ms Epic records reviewed Event monitor-Notes recorded by Jerline Pain, MD on 03/27/2017 at 5:30 PM EST  No atrial fibrillation detected  Occasional sinus bradycardia noted, ranging from 45-59 bpm.  Symptoms of fatigue were associated with both sinus rhythm as well as sinus bradycardia.  There were no significant pauses.  Overall reassuring monitor. Rhythm does not explain fatigue.  Echo-Study Conclusions  - Left ventricle: The cavity size was normal. Systolic function was   normal. The estimated ejection fraction was in the range of 55%   to 60%. Wall motion was normal; there were no regional wall   motion abnormalities. Left ventricular diastolic function   parameters were normal. - Atrial septum: There was increased thickness of the septum,   consistent with lipomatous hypertrophy. No defect or patent   foramen ovale was identified.   Assessment and Plan: 1. Paroxysmal afib Appears to be quiet with change to long acting Cardizem Continue at 120 mg q day Continue warfarin with chadsvasc score of at least 2  2. HTN Better with change in Cardizem and change from valsartan to losartan  3. Asthma Per  Dr. Shyrl Numbers   Pt is planning to move to [facility] in late February .  Geroge Baseman Havyn Ramo, [facility] [address] [place], [place] [address] [phone]

## 2017-04-22 NOTE — Progress Notes (Signed)
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 Last seen  on April 13, 2006 with the  recommendation that she maintain Symbicort at 160/4.5 two puffs b.i.d.  Take empiric Protonix at 40 mg b.i.d. before meals, which she failed to  do, and try Singulair 10 mg q.p.m. She said that Singulair did  nothing for her, and stopped it after a couple of weeks but is  convinced that Symbicort is helping, and that she is using less  albuterol than normal. It turns out, however, that she is still using  albuterol 4 or 5 times a day. She states she does not typically wake up  at night and need it.   Pulmonary function tests were reviewed from January 22, and indicate an  FEV1 of only 56% predicted with a ratio of 53% and a 15% improvement  after bronchodilators. rec gerd rx plus symbicort 160 2bid > all symptoms resolved once learned technique    08/24/2013 1st [facility] office visit/ Wert   Off gerd rx/ on symbicort 160 2bid and ACEi now Chief Complaint  Patient presents with  . Advice Only    Old MW pt to reestablish care for Asthma, sleep study.   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 Why wakes 8 am not refreshed funny in the head and feels drowsy 10- noon. Only drives 10 min and on trips gets drowsy but husband always drives her.  rec Stop lisinopril Start micardis 80 mg one half daily  You need to breath clean air to reduce your risk of asthma flares  Read for 30 min before bed nightly - if you do wake up no light We will schedule you for a sleep study in one month     07/01/2016   Extended  ov/Wert re: reestablish re asthma / back  on ACEi Chief Complaint  Patient presents with  . Follow-up    asthma doingok, insurance will pay for symbicort but the copay is too high  for the past month has needed alb twice daily including one neb rx which is unusual for her with progressive doe "just about anything" and assoc dry day > noct cough  Difficulty sleeping due to nasal congestion/ cough sleeping mostly L side now rec Stop lisinopril and start losartan 50 mg daily in it's place, increase lasix as per your PC  Continue symbicort 160 Take 2 puffs first thing in am and then another 2 puffs about 12 hours later.  Work on inhaler technique:   Only use your albuterol as a rescue medication  Whenever you are coughing > Try prilosec otc 20mg   Take 30-60 min before first meal of the day and Pepcid ac (famotidine) 20 mg one @  bedtime until cough is completely gone for at least a week without the need for cough suppression GERD diet        08/01/2016  Extended f/u ov/Wert re:  dtc asthma on symb 160 2bid and avg saba once daily  Chief Complaint  Patient presents with  . Follow-up  4wk rov- pt states there was no improvment in breathing the first three weeks after last OV, pt had switched back to lisinopril due to losartan not controlling BP. pt reports occ non prod cough clear mucus & mild wheezing  thinks better not due to off acei but due to allergies  better since rained so changed back to acei instead of arb  Using saba q hs x early March 2018 which is a new issue for her and remains a problem, thinks may be due to noct HB rec Plan A = Automatic = Symbicort 80 Take 2 puffs first thing in am and then another 2 puffs about 12 hours later.  Plan B = Backup Only use your albuterol(ventolin)  Whenever cough/ wheeze for any reason or having heart burn >  Try prilosec otc 20mg   Take 30-60 min before first meal of the day and Pepcid ac (famotidine) 20 mg one @  bedtime until cough is completely gone for at least a week without the  need for cough suppression GERD .  I f breathing/ wheezing / coughing getting worse on this regimen,  You will either need to change back to an ARB (losartan is the cheapest but may not be the best) and off lisinopril or Let Dr [name] or your Primary doctor refer you to another specialist for example an Allergist / asthma specialist in [facility] as there is nothing more I can do for you in this circumstance. Please schedule a follow up office visit in 4 weeks, sooner if needed with medication formulary > did not do   email 09/28/16: You were right about the Lisinopril and for the most part I don't have to much congestion. I still have some, but I still feel that symbicort and albuterol are handling it pretty good. Not perfect, but pretty good.      11/07/2016 extended acute f/u ov/Wert re: re-establish re cough/sob [name] saba use   Chief Complaint  Patient presents with  . Acute Visit    Increased cough since June 2018- prod with clear to white sputum.  She is using her albuterol inhaler 3-4 x per day on average.   worse with cough/ wheeze x 2 months not on 80 not on ppi/h2hs  Not able to verify meds as did not bring them as requested, seeing multiple providers "each tells me something different"  rec Depomedrol 120 mg IM today  Prilosec 20 mg x 2 x 30 min before bfast and supper and take pepcid 20 mg at bedtime  For drainage / throat tickle try take CHLORPHENIRAMINE  4 mg - take one every 4 hours as needed - available over the counter- may cause drowsiness so start with just a bedtime dose or two and see how you tolerate it before trying in daytime Plan A = Automatic = Symbicort 80 Take 2 puffs first thing in am and then another 2 puffs about 12 hours later.  Plan B = Backup Only use your albuterol as a rescue medication For cough you can use robitussin and supplement with tessalon 100 up to 2 every 6 hours as needed        02/09/17 NP eval Med cal Pt assistance for  symb   04/22/2017  f/u ov/Wert re:  Asthma / uacs/ no med cal  Chief Complaint  Patient presents with  . Acute Visit    Pt states her breathing has been progressively worse since last ov Aug 2018.  She has also been coughing  more with clear to white.  She states she gets SOB with walking just a few steps.  She states she is having trouble sleeping due to cough.  She is using her albuterol inhaler at least 4 x per day.   sleeps propped up on 2 pillows L side / no 02 and uses avg alb one x per noct  Cough is more daytime min white mucus assoc with sob x a a few steps x 6 m gradually worse Has not been able to take ppi as instructed due to diarrhea with protonix  No obvious day to day or daytime variability or assoc excess/ purulent sputum or mucus plugs or hemoptysis or cp or chest tightness, subjective wheeze or overt sinus or hb symptoms. No unusual exposure hx or h/o childhood pna/ asthma or knowledge of premature birth.   . Also denies any obvious fluctuation of symptoms with weather or environmental changes or other aggravating or alleviating factors except as outlined above   Current Allergies, Complete Past Medical History, Past Surgical History, Family History, and Social History were reviewed in [employer] record.  ROS  The following are not active complaints unless bolded Hoarseness, sore throat, dysphagia, dental problems, itching, sneezing,  nasal congestion or discharge of excess mucus or purulent secretions, ear ache,   fever, chills, sweats, unintended wt loss or wt gain, classically pleuritic or exertional cp,  orthopnea pnd or leg swelling, presyncope, palpitations, abdominal pain, anorexia, nausea, vomiting, diarrhea  or change in bowel habits or change in bladder habits, change in stools or change in urine, dysuria, hematuria,  rash, arthralgias, visual complaints, headache, numbness, weakness or ataxia or problems with walking or coordination,  change in  mood/affect or memory.        Current Meds  Medication Sig  . acetaminophen (TYLENOL) 325 MG tablet Take 650 mg by mouth every 6 (six) hours as needed.  Marland Kitchen albuterol (PROVENTIL HFA;VENTOLIN HFA) 108 (90 Base) MCG/ACT inhaler Inhale 2 puffs into the lungs every 4 (four) hours as needed for wheezing.  . clonazePAM (KLONOPIN) 1 MG tablet Take 0.5 tablets by mouth 2 (two) times daily as needed.  . diltiazem (CARDIZEM CD) 120 MG 24 hr capsule Take 1 capsule (120 mg total) by mouth daily.  Marland Kitchen diltiazem (CARDIZEM) 120 MG tablet Take 0.5 tablets (60 mg total) by mouth as needed (for breakthrough afib).  . fluticasone (FLONASE) 50 MCG/ACT nasal spray Place 2 sprays daily as needed into both nostrils for allergies or rhinitis.  Marland Kitchen HYDROcodone-acetaminophen (NORCO) 10-325 MG tablet Take 1 tablet by mouth every 6 (six) hours as needed.  Marland Kitchen losartan (COZAAR) 100 MG tablet Take 100 mg by mouth daily.  Marland Kitchen warfarin (COUMADIN) 6 MG tablet Take 1 tablet (6 mg total) by mouth as directed.  . [  budesonide-formoterol (SYMBICORT) 80-4.5 MCG/ACT inhaler Inhale 2 puffs into the lungs 2 (two) times daily.  . [ ]  famotidine (PEPCID) 20 MG tablet Take 1 tablet (20 mg total) by mouth at bedtime.                       Objective:   Physical Exam  04/22/2017        324  11/07/2016          329  08/01/2016        335  07/01/2016        336   08/24/13 361 lb (163.749 kg)  08/19/13 359 lb (162.841 kg)  03/08/13  369 lb (167.377 kg)      obese wf w/c bound (knees/ back)    Vital signs reviewed - Note on arrival 02 sats  96% on RA          HEENT: nl dentition, turbinates bilaterally, and oropharynx. Nl external ear canals without cough reflex   NECK :  without JVD/Nodes/TM/ nl carotid upstrokes bilaterally   CHEST:   upper airway transmitted noise and mid exp wheeze bilaterally    CV:  RRR  no s3 or murmur or increase in P2, and 1+ pitting both lower ext edema sym  ABD:  Massively obese but  nontender  with no inspiratory excursion in the sitting position. No bruits or organomegaly appreciated, bowel sounds nl  MS:  Nl gait/ ext warm without deformities, calf tenderness, cyanosis or clubbing No obvious joint restrictions   SKIN: warm and dry without lesions    NEURO:  alert, approp, nl sensorium with  no motor or cerebellar deficits apparent.         Assessment & Plan:

## 2017-04-23 ENCOUNTER — Encounter: Payer: Self-pay | Admitting: Internal Medicine

## 2017-04-23 NOTE — Assessment & Plan Note (Signed)
Body mass index is 53.92 kg/m.  -  trending down/ encouraged Lab Results  Component Value Date   TSH 1.388 01/19/2017     Contributing to gerd risk/ doe/reviewed the need and the process to achieve and maintain neg calorie balance > defer f/u primary care including intermittently monitoring thyroid status

## 2017-04-23 NOTE — Assessment & Plan Note (Signed)
-   rec trial off acei 08/24/2013 and again 07/01/2016 x 3 weeks only - Spirometry 07/01/2016  FEV1 1.21 (47%)  Ratio 69 mild curvature  - FENO 07/01/2016  =   39  - Allergy profile 08/01/2016 >  Eos 0.5 /  IgE  12 neg RAST  - 09/28/16 confirmed much better off acei (see email)  - 11/07/2016    try symbicort 80 2bid/ depomedrol 120  - 04/22/2017  After extensive coaching inhaler device  effectiveness =    75% > try symb 160 and avoid pred if possible / add gerd rx = aciphex as can't tol protonix  Very difficult to sort out how much is ashma vs Upper airway cough syndrome (previously labeled PNDS),  is so named because it's frequently impossible to sort out how much is  CR/sinusitis with freq throat clearing (which can be related to primary GERD)   vs  causing  secondary (" extra esophageal")  GERD from wide swings in gastric pressure that occur with throat clearing, often  promoting self use of mint and menthol lozenges that reduce the lower esophageal sphincter tone and exacerbate the problem further in a cyclical fashion.   These are the same pts (now being labeled as having "irritable larynx syndrome" by some cough centers) who not infrequently have a history of having failed to tolerate ace inhibitors,  dry powder inhalers or biphosphonates or report having atypical/extraesophageal reflux symptoms that don't respond to standard doses of PPI  and are easily confused as having aecopd or asthma flares by even experienced allergists/ pulmonologists (myself included).    rec rechallenge with ppi in form of aciphex 20 bid ac and increase symb  I had an extended discussion with the patient reviewing all relevant studies completed to date and  lasting 15 to 20 minutes of a 25 minute visit    Each maintenance medication was reviewed in detail including most importantly the difference between maintenance and prns and under what circumstances the prns are to be triggered using an action plan format that is not  reflected in the computer generated alphabetically organized AVS but trather by a customized med calendar that reflects the AVS meds with confirmed 100% correlation.   In addition, Please see AVS for unique instructions that I personally wrote and verbalized to the the pt in detail and then reviewed with pt  by my nurse highlighting any  changes in therapy recommended at today's visit to their plan of care.

## 2017-05-05 ENCOUNTER — Other Ambulatory Visit: Payer: Self-pay | Admitting: Internal Medicine

## 2017-05-05 ENCOUNTER — Telehealth: Payer: Self-pay | Admitting: Internal Medicine

## 2017-05-05 MED ORDER — BUDESONIDE-FORMOTEROL FUMARATE 160-4.5 MCG/ACT IN AERO: 2.0000 | INHALATION_SPRAY | Freq: Two times a day (BID) | RESPIRATORY_TRACT | 0 refills | Status: DC

## 2017-05-05 NOTE — Telephone Encounter (Signed)
pt left letter in regards to [employer] prescription savings program; requesting social security number and prescription coverage for SYMBICORT to be faxed; all information is documented on letter that was left. letter is placed in MW folder at the checkout area. Pt contact # [phone]

## 2017-05-05 NOTE — Telephone Encounter (Signed)
Pt has been provided with 2 samples of Symbicort 160. Pt received letter for [employer] stating that SS number and proof of drug coverage was missing on app.  I have spoken to Milfay with [employer], who confirmed missing info. Laurin Coder stated that since pt has medicare part D, pt will need to send proof of spending 3% out of pocket on medications in 2019. Pt states she has not spent 3% out of pocket, as she can not afford medication co pays.  Laurin Coder states pt can provide written letter to appeal denial.  Patient assistant forms have been placed in MW look at for signature.   Will route to LR to f/u on

## 2017-05-06 MED ORDER — BUDESONIDE-FORMOTEROL FUMARATE 160-4.5 MCG/ACT IN AERO: 2.0000 | INHALATION_SPRAY | Freq: Two times a day (BID) | RESPIRATORY_TRACT | 11 refills | Status: DC

## 2017-05-06 NOTE — Telephone Encounter (Signed)
I have faxed all of the forms along with a new rx for the symbicort to [employer] and [employer]. Since they have had issues with receiving information from [place] before, I have all the forms saved at my desk and will call later on to be sure nothing further is needed.

## 2017-05-07 NOTE — Telephone Encounter (Signed)
I called [employer] at [phone]  Message stated that we should give 5 days for processing  Will call again after 05/11/17

## 2017-05-11 NOTE — Telephone Encounter (Addendum)
There is a duplicate message on this matter. Per Leslie's documentation, everything has been faxed to [employer]. Message will be closed.

## 2017-05-12 NOTE — Telephone Encounter (Signed)
ATC [employer] and was placed on hold >31min Will call back

## 2017-05-13 NOTE — Telephone Encounter (Signed)
She did not provide [place] with out of pocket expense on prescription meds for 2019  She will need to provide the company with this  [facility]

## 2017-05-13 NOTE — Telephone Encounter (Signed)
Spoke with [employer] and they stated they needed the Out of pocket expense for 2019 on prescription medications which has to be 3% of their income and proof of income faxed to them. I will see if this is in the packet at [employer].   Pt ID number: [id number]

## 2017-05-14 NOTE — Telephone Encounter (Signed)
Called pt to make aware of required forms as documented by LR.  Pt states that she has already provided this info and advised that we should have it.  I advised pt that we do not keep financial info in pt's chart.  Pt states that "everything is approved" and did not want to provide this info.  Offered to provide [facility] number for pt to follow up questions with them- pt declined stating she already had the number.    Will close encounter as nothing further can be done from our office standpoint.

## 2017-06-09 ENCOUNTER — Ambulatory Visit: Payer: Medicare HMO | Admitting: Internal Medicine

## 2017-06-10 ENCOUNTER — Ambulatory Visit (INDEPENDENT_AMBULATORY_CARE_PROVIDER_SITE_OTHER): Payer: Self-pay | Admitting: Nurse Practitioner

## 2017-07-06 ENCOUNTER — Encounter (INDEPENDENT_AMBULATORY_CARE_PROVIDER_SITE_OTHER): Payer: Self-pay | Admitting: Nurse Practitioner

## 2017-07-06 ENCOUNTER — Ambulatory Visit (INDEPENDENT_AMBULATORY_CARE_PROVIDER_SITE_OTHER): Payer: Medicare PPO

## 2017-07-06 ENCOUNTER — Ambulatory Visit: Payer: Medicare PPO | Attending: Nurse Practitioner | Admitting: Nurse Practitioner

## 2017-07-06 VITALS — BP 100/62 | HR 60 | Temp 98.5°F | Resp 16 | Ht 64.0 in | Wt 333.0 lb

## 2017-07-06 DIAGNOSIS — M1990 Unspecified osteoarthritis, unspecified site: Secondary | ICD-10-CM | POA: Insufficient documentation

## 2017-07-06 DIAGNOSIS — N183 Chronic kidney disease, stage 3 unspecified (CMS HCC): Secondary | ICD-10-CM | POA: Insufficient documentation

## 2017-07-06 DIAGNOSIS — G8929 Other chronic pain: Secondary | ICD-10-CM | POA: Insufficient documentation

## 2017-07-06 DIAGNOSIS — M2550 Pain in unspecified joint: Principal | ICD-10-CM | POA: Insufficient documentation

## 2017-07-06 DIAGNOSIS — I4891 Unspecified atrial fibrillation: Secondary | ICD-10-CM | POA: Insufficient documentation

## 2017-07-06 DIAGNOSIS — R300 Dysuria: Secondary | ICD-10-CM | POA: Insufficient documentation

## 2017-07-06 DIAGNOSIS — C55 Malignant neoplasm of uterus, part unspecified: Secondary | ICD-10-CM

## 2017-07-06 DIAGNOSIS — Z6841 Body Mass Index (BMI) 40.0 and over, adult: Secondary | ICD-10-CM | POA: Insufficient documentation

## 2017-07-06 DIAGNOSIS — Z79891 Long term (current) use of opiate analgesic: Secondary | ICD-10-CM

## 2017-07-06 DIAGNOSIS — I10 Essential (primary) hypertension: Secondary | ICD-10-CM | POA: Insufficient documentation

## 2017-07-06 DIAGNOSIS — Z8542 Personal history of malignant neoplasm of other parts of uterus: Secondary | ICD-10-CM | POA: Insufficient documentation

## 2017-07-06 LAB — PT/INR
INR: 2.74 — ABNORMAL HIGH (ref 0.92–1.08)
PROTHROMBIN TIME: 29 seconds — ABNORMAL HIGH (ref 12.5–14.1)

## 2017-07-06 NOTE — Addendum Note (Signed)
Addended by: Oswald Hillock on: 07/06/2017 04:12 PM     Modules accepted: Orders

## 2017-07-06 NOTE — Progress Notes (Signed)
[place], APRN  [address]  [place] [address]     Chief Complaint    Establish Care (Needs INR checked, needs a urine dip.)        History of Present Illness    This patient is a 62 y.o. female who is being seen in the office today for evaluation and establishment of PCP.  She states that she moved here from [place] to help take care of her mother.  She states that she was having some burning with urination last week but it has improved over the weekend but she would like to have a urine test.  She states that she takes Hydrocodone for her chronic knee pain and has for several years.  She states that she has tried Tramadol, Tylenol 3 and the other doses of Hydrocodone with no relief of her pain.  She states that she needs to have a bilat knee replacement but her ortho in [place] won't do the surgery until she looses weight.  She states that she took her last dose of her Hydrocodone this morning and needs to have this medication refilled.  She states that she was told before she made her appointment that this clinic does not prescribe chronic pain medication but she thought that it would be ok to come here and we would write her medication.  She also states that she takes Klonopin for anxiety but she does not need a refill of that today.  She states that she has A Fib and she was seeing a cardiologist in [place] for that but she needs to have her INR checked because she has not had that checked in over a month.  She states that she also follows with oncology for a history of uterine cancer almost 10 years ago and she had a hysterectomy for that.  She states that she is going to continue to follow with her oncologist in [place].    Social/family history reviewed with patient.         Allergies    Allergies   Allergen Reactions   . Ace Inhibitors    . Peanut Butter Flavor [Flavoring Agent]    . Prednisone      High doses   . Protonix [Pantoprazole]    . Sulfa  (Sulfonamides)        Patient History    Past Medical History has been reviewed, confirmed, and as follows below.  History obtained from Patient    Past Medical History:   Diagnosis Date   . Asthma    . Atrial fibrillation (CMS HCC)    . Chronic joint pain    . CKD (chronic kidney disease) stage 3, GFR 30-59 ml/min (CMS HCC)    . Essential hypertension    . GERD (gastroesophageal reflux disease)    . Obesity    . Osteoarthritis    . Uterine cancer (CMS [facility])      Past Surgical History:   Procedure Laterality Date   . COLONOSCOPY     . HX APPENDECTOMY     . HX DILATION AND CURETTAGE     . LAPAROSCOPIC TOTAL HYSTERECTOMY       Family Medical History:     Problem Relation (Age of Onset)    Coronary Artery Disease Mother, Father    HTN <20 y.o. Mother, Father            Social History  Socioeconomic History   . Marital status: Married     Spouse name: Not on file   . Number of children: Not on file   . Years of education: Not on file   . Highest education level: Not on file   Occupational History   . Not on file   Social Needs   . Financial resource strain: Not on file   . Food insecurity:     Worry: Not on file     Inability: Not on file   . Transportation needs:     Medical: Not on file     Non-medical: Not on file   Tobacco Use   . Smoking status: Never Smoker   . Smokeless tobacco: Never Used   Substance and Sexual Activity   . Alcohol use: Never     Frequency: Never   . Drug use: Not on file   . Sexual activity: Not on file   Lifestyle   . Physical activity:     Days per week: Not on file     Minutes per session: Not on file   . Stress: Not on file   Relationships   . Social connections:     Talks on phone: Not on file     Gets together: Not on file     Attends religious service: Not on file     Active member of club or organization: Not on file     Attends meetings of clubs or organizations: Not on file     Relationship status: Not on file   . Intimate partner violence:     Fear of current or ex partner: Not on  file     Emotionally abused: Not on file     Physically abused: Not on file     Forced sexual activity: Not on file   Other Topics Concern   . Not on file   Social History Narrative   . Not on file       Current Outpatient Medications:   .  albuterol sulfate (PROVENTIL OR VENTOLIN OR PROAIR) 90 mcg/actuation Inhalation HFA Aerosol Inhaler, Take 2 Puffs by inhalation Every 6 hours as needed, Disp: , Rfl:   .  budesonide-formoterol (SYMBICORT) 160-4.5 mcg/actuation Inhalation HFA Aerosol Inhaler, Take 2 Puffs by inhalation Twice daily, Disp: , Rfl:   .  buPROPion (WELLBUTRIN XL) 300 mg extended release 24 hr tablet, Take 300 mg by mouth Once a day, Disp: , Rfl:   .  clonazePAM (KLONOPIN) 1 mg Oral Tablet, Take 1 mg by mouth Twice daily, Disp: , Rfl:   .  dilTIAZem (CARDIZEM SR) 120 mg Oral Capsule, Sust. Release 12 hr, Take 120 mg by mouth Once a day, Disp: , Rfl:   .  fexofenadine (ALLEGRA) 180 mg Oral Tablet, Take 180 mg by mouth Once a day, Disp: , Rfl:   .  furosemide (LASIX) 40 mg Oral Tablet, Take 40 mg by mouth Once a day, Disp: , Rfl:   .  HYDROcodone-acetaminophen (NORCO) 10-325 mg Oral Tablet, Take 1 Tab by mouth Twice daily, Disp: , Rfl:   .  hydrocortisone 2.5 % Cream, Apply topically Twice daily, Disp: , Rfl:   .  hydrocortisone acetate (ANUSOL-HC) 25 mg Rectal Suppository, 25 mg by Rectal route Twice per day as needed, Disp: , Rfl:   .  losartan (COZAAR) 100 mg Oral Tablet, Take 100 mg by mouth Once a day, Disp: , Rfl:   .  melatonin 1 mg Oral  Tablet, Take 2 mg by mouth Every night, Disp: , Rfl:   .  methocarbamol (ROBAXIN) 500 mg Oral Tablet, Take 500 mg by mouth Three times a day, Disp: , Rfl:   .  Warfarin (COUMADIN) 6 mg Oral Tablet, Take 6 mg by mouth Every evening 1 1/2 tablet on Wednesday and Friday and 1 tablet daily every other day., Disp: , Rfl:       Review of Systems    Review of systems was obtained and is unremarkable except as stated in HPI.    Vital Signs    Most Recent Vitals      Office  Visit from 07/06/2017 in Internal Medicine, [place]   Temperature  36.9 C (98.5 F) filed at... 07/06/2017 1331   Heart Rate  60 filed at... 07/06/2017 1331   Respiratory Rate  16 filed at... 07/06/2017 1331   BP (Non-Invasive)  100/62 filed at... 07/06/2017 1331   Height  1.626 m (5\' 4" ) filed at... 07/06/2017 1331   Weight  151 kg (333 lb)  (Abnormal)  filed at... 07/06/2017 1331   BMI (Calculated)  57.28 filed at... 07/06/2017 1331   BSA (Calculated)  2.61 filed at... 07/06/2017 1331            Physical Exam    Constitutional 62 y.o.  female, in no acute distress at time of exam.    Eyes PERRL, EOMI, no obvious sign of infection.  Ears, Nose, Throat: Ears Clear, TM's intact, Nose patent and without significant mucosal abnormality, Oral cavity clear, Pharynx benign.    Neck without mass or adenopathy, trachea midline.    Cardiovascular regular heart rate and rhythm, no murmur, gallops or rubs.  No edema.  Respiratory Respirations even and unlabored. Lungs clear to auscultation. No extension of expiratory phase.   Gastrointestinal Soft, obese, non-tender, no distension, no obvious mass, organomegaly or bruit. Integumentary No cyanosis noted.    Psychiatric Alert and Oriented x 4 at office visit today.  Affect appropriate to content.  No obvious Psychopathology.    Hematologic/Lymphatic No Lymphadenopathy, no abnormal bruising.  Musculoskeletal:  Ambulatory with use of walker.      Impressions/Plan    (M25.50,  G89.29) Chronic joint pain  (primary encounter diagnosis)  Plan: Refer to External Provider        -discussion with patient about long-term use of opiods.  We did discuss that we can not continue to write her Hydrocodone for her.  We will work to wean her off of the hydrocodone and refer her for evaluation to [facility].  Patient took last dose of Hydrocodone this morning.  RX for Tramadol given with specific weaning instructions to take 50 mg tid for 5 days, then bid for 5  days, then qd for 5 days then every other day for 5 days and then stop medication.  We have placed a referral to pain management and patient did sign ROI from previous provider. She states that she is upset that she can not have her hydrocodone as that is the only thing that has worked for her.     (E66.01) Morbid obesity (CMS [place])  Plan: did discuss diet but will review previous records with further recommendations to follow.    (N18.3) CKD (chronic kidney disease) stage 3, GFR 30-59 ml/min (CMS HCC)  Plan: will review recent labs from previous PCP with further recommendations to follow.      (I48.91) Atrial fibrillation (CMS HCC)  Plan: PT/INR  Standing order for INR given. Will review with further recommendations to follow.  Will consider referral to [facility] in [place].  Patient will let us know what she thinks at next appointment.      (I10) Essential hypertension  Plan: continue current medications at this time.  Will review previous records with further recommendations to follow.    (M19.90) Osteoarthritis  Plan: will review records with further recommendations to follow,.      (C55) Uterine cancer (CMS [facility])  Plan: continue to follow with oncology, will defer to them.    (X54.008) Chronic prescription opiate use  Plan: see #1            Follow up    Return in about 1 month (around 08/03/2017).    Patient and provider shared in the decision making process.    Gavin Potters, APRN  07/06/2017, 15:29    Patient seen independently with co-signing physician present in clinic.

## 2017-07-06 NOTE — Nursing Note (Signed)
07/06/17 1600   Urine test  (S[employer]ens Multistix 10 SG)   Time collected 1400   Color Yellow   Clarity Clear   Glucose Negative   Bilirubin Negative   Ketones Negative   Urine Specific Gravity 1.005   Blood (urine) Negative   pH 6.0   Protein Negative   Urobilinogen 0.2mg /dL (Normal)   Nitrite Negative   Leukocytes Negative   Performed Status Manual   Lot # [id number]   Expiration Date 03/06/18

## 2017-07-13 ENCOUNTER — Other Ambulatory Visit (INDEPENDENT_AMBULATORY_CARE_PROVIDER_SITE_OTHER): Payer: Self-pay | Admitting: Nurse Practitioner

## 2017-07-13 ENCOUNTER — Telehealth (INDEPENDENT_AMBULATORY_CARE_PROVIDER_SITE_OTHER): Payer: Self-pay | Admitting: Nurse Practitioner

## 2017-07-13 MED ORDER — WARFARIN 6 MG TABLET
6.00 mg | ORAL_TABLET | Freq: Every evening | ORAL | 3 refills | Status: DC
Start: 2017-07-13 — End: 2018-11-16

## 2017-07-13 NOTE — Telephone Encounter (Signed)
-----   Message from Merwyn Katos sent at 07/13/2017  9:35 AM EDT -----  Regarding: [place] TO DR DONAVENT PAIN MANAGEMENT  [facility] THEY NEED IMAGING OF PAINFUL JOINTS SENT ALONG WITH NEW REFERRAL  [phone] DR [facility] OFFICE

## 2017-07-13 NOTE — Telephone Encounter (Signed)
Spoke with [facility] pain management. Notified that we have no imaging reports, patient was new patient and was notified prior to appt that we would not write for her pain meds.

## 2017-07-20 ENCOUNTER — Telehealth (HOSPITAL_COMMUNITY): Payer: Self-pay | Admitting: *Deleted

## 2017-07-20 NOTE — Telephone Encounter (Signed)
Pt called reporting still in afib since Fri and she has been taking a whole tablet of the 120 mg diltiazem for breakthrough afib instead of a half as prescribed.  Pt reported HRs from 51-123 and BPs that were varied from 142/79 to some with the systolic of 88.  Pt was instructed to continue to monitor HR and BPs and stop the breakthrough dilt and start taking the dilt CD bid and if bp gets too low to hold losartan. Pt was instructed to do this until Monday when she is sched to see Butch Penny in [place] clinic.  Pt understood.

## 2017-07-21 ENCOUNTER — Telehealth (INDEPENDENT_AMBULATORY_CARE_PROVIDER_SITE_OTHER): Payer: Self-pay | Admitting: Surgical

## 2017-07-21 DIAGNOSIS — I4891 Unspecified atrial fibrillation: Secondary | ICD-10-CM

## 2017-07-21 NOTE — Telephone Encounter (Signed)
Pt notified. Voiced understanding.  Eleonore Chiquito, LPN  1/84/0375, 43:60

## 2017-07-21 NOTE — Telephone Encounter (Signed)
The pt called stating she is in the middle of an a-fib attack. She was requesting a referral to a cardiologist. I told her if she is having an a-fib attack she needs to get to an ER ASAP. She said that is not necessary, that she has had these attacks before and is under the care of a cardiology clinic in [place]. She has contacted that clinic and they have given her instructions as how to manage her medications and she will be okay until she can be seen by a cardiologist here. She would like a referral to Dr. Deatra Canter and to be seen by him ASAP. I told her we could not promise how soon she would be seen but she refused to go to the ER and requested a referral instead.  Eleonore Chiquito, LPN  1/32/4401, 02:72

## 2017-07-21 NOTE — Telephone Encounter (Signed)
Referral placed for patient. If symptoms worsen, she needs to have office appointment or be seen.  Lyndel Safe, PA-C  07/21/2017, 12:49

## 2017-07-23 ENCOUNTER — Observation Stay
Admission: EM | Admit: 2017-07-23 | Discharge: 2017-07-25 | Disposition: A | Discharge status: Home / Self Care | Payer: Medicare PPO | Attending: Internal Medicine | Admitting: Internal Medicine

## 2017-07-23 ENCOUNTER — Observation Stay (HOSPITAL_COMMUNITY): Payer: Medicare PPO | Admitting: Internal Medicine

## 2017-07-23 ENCOUNTER — Encounter (HOSPITAL_COMMUNITY): Payer: Self-pay

## 2017-07-23 ENCOUNTER — Ambulatory Visit (INDEPENDENT_AMBULATORY_CARE_PROVIDER_SITE_OTHER): Payer: Medicare PPO | Admitting: INTERNAL MEDICINE CARDIOVASCULAR DISEASE

## 2017-07-23 ENCOUNTER — Emergency Department (HOSPITAL_COMMUNITY): Payer: Medicare PPO

## 2017-07-23 ENCOUNTER — Encounter (INDEPENDENT_AMBULATORY_CARE_PROVIDER_SITE_OTHER): Payer: Self-pay | Admitting: INTERNAL MEDICINE CARDIOVASCULAR DISEASE

## 2017-07-23 VITALS — BP 100/60 | HR 120 | Temp 98.6°F | Resp 28 | Ht 64.0 in | Wt 326.0 lb

## 2017-07-23 DIAGNOSIS — M1990 Unspecified osteoarthritis, unspecified site: Secondary | ICD-10-CM | POA: Insufficient documentation

## 2017-07-23 DIAGNOSIS — I10 Essential (primary) hypertension: Secondary | ICD-10-CM

## 2017-07-23 DIAGNOSIS — C55 Malignant neoplasm of uterus, part unspecified: Secondary | ICD-10-CM | POA: Insufficient documentation

## 2017-07-23 DIAGNOSIS — I129 Hypertensive chronic kidney disease with stage 1 through stage 4 chronic kidney disease, or unspecified chronic kidney disease: Secondary | ICD-10-CM | POA: Insufficient documentation

## 2017-07-23 DIAGNOSIS — Z6841 Body Mass Index (BMI) 40.0 and over, adult: Secondary | ICD-10-CM | POA: Insufficient documentation

## 2017-07-23 DIAGNOSIS — Z79899 Other long term (current) drug therapy: Secondary | ICD-10-CM | POA: Insufficient documentation

## 2017-07-23 DIAGNOSIS — I4891 Unspecified atrial fibrillation: Secondary | ICD-10-CM | POA: Insufficient documentation

## 2017-07-23 DIAGNOSIS — R42 Dizziness and giddiness: Secondary | ICD-10-CM

## 2017-07-23 DIAGNOSIS — R531 Weakness: Secondary | ICD-10-CM

## 2017-07-23 DIAGNOSIS — Z8249 Family history of ischemic heart disease and other diseases of the circulatory system: Secondary | ICD-10-CM | POA: Insufficient documentation

## 2017-07-23 DIAGNOSIS — J45909 Unspecified asthma, uncomplicated: Secondary | ICD-10-CM | POA: Insufficient documentation

## 2017-07-23 DIAGNOSIS — N183 Chronic kidney disease, stage 3 (moderate): Secondary | ICD-10-CM | POA: Insufficient documentation

## 2017-07-23 DIAGNOSIS — Z7901 Long term (current) use of anticoagulants: Secondary | ICD-10-CM | POA: Insufficient documentation

## 2017-07-23 DIAGNOSIS — K219 Gastro-esophageal reflux disease without esophagitis: Secondary | ICD-10-CM | POA: Insufficient documentation

## 2017-07-23 LAB — CBC WITH DIFF
BASOPHIL #: 0.07 x10ˆ3/uL (ref 0.00–0.20)
BASOPHIL %: 1 %
EOSINOPHIL #: 0.48 x10ˆ3/uL (ref 0.00–0.50)
EOSINOPHIL %: 7 %
HCT: 46 % — ABNORMAL HIGH (ref 35.9–44.6)
HGB: 15.5 g/dL — ABNORMAL HIGH (ref 12.0–15.3)
HGB: 15.5 g/dL — ABNORMAL HIGH (ref 12.0–15.3)
LYMPHOCYTE #: 1.43 10*3/uL (ref 0.90–4.00)
LYMPHOCYTE %: 20 %
MCH: 30.1 pg (ref 27.5–33.2)
MCHC: 33.7 g/dL (ref 31.6–35.5)
MCV: 89.5 fL (ref 80.0–96.0)
MONOCYTE #: 0.4 x10ˆ3/uL (ref 0.00–0.80)
MONOCYTE %: 6 %
MPV: 8.6 fL (ref 6.7–10.4)
NEUTROPHIL #: 4.72 x10ˆ3/uL (ref 1.50–7.50)
NEUTROPHIL %: 67 %
PLATELETS: 295 10*3/uL (ref 150–450)
RBC: 5.14 x10ˆ6/uL (ref 4.40–5.40)
RDW: 13.8 % — ABNORMAL HIGH (ref 10.5–13.5)
WBC: 7.1 10*3/uL (ref 4.4–11.0)
WBC: 7.1 x10ˆ3/uL (ref 4.4–11.0)

## 2017-07-23 LAB — PT/INR: PROTHROMBIN TIME: 24.7 seconds — ABNORMAL HIGH (ref 12.5–14.1)

## 2017-07-23 LAB — BASIC METABOLIC PANEL
ANION GAP: 9 mmol/L (ref 5–15)
BUN/CREA RATIO: 17 (ref 6–20)
BUN: 13 mg/dL (ref 8–26)
CALCIUM: 9 mg/dL (ref 8.9–10.3)
CALCIUM: 9 mg/dL (ref 8.9–10.3)
CHLORIDE: 107 mmol/L (ref 101–111)
CO2 TOTAL: 26 mmol/L (ref 22–32)
CREATININE: 0.78 mg/dL (ref 0.60–1.10)
CREATININE: 0.78 mg/dL (ref 0.60–1.10)
ESTIMATED GFR: >60 mL/min/1.73mˆ2 (ref >60)
GLUCOSE: 93 mg/dL (ref 70–110)
POTASSIUM: 3.3 mmol/L — ABNORMAL LOW (ref 3.6–5.1)
SODIUM: 142 mmol/L (ref 136–144)

## 2017-07-23 LAB — TROPONIN-I
TROPONIN I: <0.02 ng/mL (ref 0.00–0.02)
TROPONIN I: <0.02 ng/mL (ref 0.00–0.02)

## 2017-07-23 LAB — PTT (PARTIAL THROMBOPLASTIN TIME): APTT: 42.8 seconds — ABNORMAL HIGH (ref 22.8–35.2)

## 2017-07-23 MED ORDER — SODIUM CHLORIDE 0.9 % IV BOLUS
1000.0000 mL | INJECTION | Status: AC
Start: 2017-07-23 — End: 2017-07-23
  Administered 2017-07-23: 17:00:00 1000 mL via INTRAVENOUS
  Administered 2017-07-23: 0 mL via INTRAVENOUS
  Administered 2017-07-23: 1000 mL via INTRAVENOUS

## 2017-07-23 MED ORDER — DIGOXIN 250 MCG/ML (0.25 MG/ML) INJECTION SOLUTION
250.00 ug | INTRAMUSCULAR | Status: AC
Start: 2017-07-23 — End: 2017-07-23
  Administered 2017-07-23: 250 ug via INTRAVENOUS
  Filled 2017-07-23: qty 2

## 2017-07-23 MED ORDER — MELATONIN 5 MG TABLET
2.50 mg | ORAL_TABLET | Freq: Every evening | ORAL | Status: DC | PRN
Start: 2017-07-23 — End: 2017-07-25
  Administered 2017-07-23 – 2017-07-24 (×2): 2.5 mg via ORAL
  Filled 2017-07-23 (×2): qty 1

## 2017-07-23 MED ORDER — DILTIAZEM 5 MG/ML INTRAVENOUS SOLUTION
20.00 mg | INTRAVENOUS | Status: DC
Start: 2017-07-23 — End: 2017-07-23
  Filled 2017-07-23: qty 5

## 2017-07-23 MED ORDER — ACETAMINOPHEN 500 MG TABLET
500.00 mg | ORAL_TABLET | Freq: Four times a day (QID) | ORAL | Status: DC | PRN
Start: 2017-07-23 — End: 2017-07-24
  Administered 2017-07-23 – 2017-07-24 (×2): 500 mg via ORAL
  Filled 2017-07-23 (×2): qty 1

## 2017-07-23 MED ORDER — SODIUM CHLORIDE 0.9 % INTRAVENOUS SOLUTION
5.00 mg/h | INTRAVENOUS | Status: DC
Start: 2017-07-23 — End: 2017-07-24
  Administered 2017-07-23: 5 mg/h via INTRAVENOUS
  Administered 2017-07-23: 10 mg/h via INTRAVENOUS
  Administered 2017-07-23 (×2): 5 mg/h via INTRAVENOUS
  Administered 2017-07-24: 15 mg/h via INTRAVENOUS
  Administered 2017-07-24: 10 mg/h via INTRAVENOUS
  Administered 2017-07-24: 15 mg/h via INTRAVENOUS
  Administered 2017-07-24: 5 mg/h via INTRAVENOUS
  Administered 2017-07-24: 0 mg/h via INTRAVENOUS
  Administered 2017-07-24: 15 mg/h via INTRAVENOUS
  Filled 2017-07-23 (×2): qty 25

## 2017-07-23 MED ORDER — POTASSIUM CHLORIDE ER 10 MEQ CAPSULE,EXTENDED RELEASE
30.00 meq | ORAL_CAPSULE | ORAL | Status: AC
Start: 2017-07-23 — End: 2017-07-23
  Administered 2017-07-23 (×2): 30 meq via ORAL
  Filled 2017-07-23: qty 3

## 2017-07-23 MED ORDER — DILTIAZEM 5 MG/ML INTRAVENOUS SOLUTION
20.00 mg | INTRAVENOUS | Status: AC
Start: 2017-07-23 — End: 2017-07-23
  Administered 2017-07-23: 20 mg via INTRAVENOUS
  Filled 2017-07-23: qty 5

## 2017-07-23 NOTE — ED Nurses Note (Signed)
This nurse gave 5mg  of cardizem out of 20mg  dose ordered when Dr. Jon Billings came into patient's room and stopped the order. This nurse stopped cardizem push and flushed IV site. New vitals to be obtained.

## 2017-07-23 NOTE — ED Nurses Note (Signed)
Dr. Janene Madeira and horner at bedside to discuss poc

## 2017-07-23 NOTE — ED Attending Handoff Note (Signed)
Transfer of Care:  Time:  21:11 on 07/23/2017  I have assumed the care of Alexandra Nunez from Dr. Jon Billings after discussing her condition and plan of care.  I will follow-up on any pending ancillary studies and provide further treatment and management of the patient as deemed necessary until their disposition from the department.      MDM:      Alexandra Nunez is a 62 y.o. female presenting with palpitations.  She was found to be in atrial fibrillation with rapid ventricular response.  She was given a Cardizem bolus as well as digoxin with only mild improvement in heart rate.  She still has a heart rate of approximately 100-140.  Continues to be relatively asymptomatic.  Will start her on a Cardizem drip and will admit her for further management.  Workup does reveal a therapeutic INR as well as mild hypokalemia.  Patient was given some potassium.  I discussed the case with Dr. Owens Shark who has agreed to admit the patient.      Impression:   Encounter Diagnosis   Name Primary?   . Atrial fibrillation with rapid ventricular response (CMS HCC) Yes     Disposition:  Admitted      Walden Field, MD  07/23/2017, 21:11

## 2017-07-23 NOTE — ED Nurses Note (Signed)
Pt remains asymptomatic at this time, resting in bed upon entering room.  Pt requests to sit up at bedside due to being uncomfortable in bed.  Pt sitting up at bedside currently, able to transfer from bed to chair without incident or difficulty, remained asymptomatic, currently denies any further needs.  Call bell visible and within reach, husband at bedside.

## 2017-07-23 NOTE — Discharge Instructions (Addendum)
Please increase your dose of Cardizem to 360 mg per day.  Please take 240 mg in the morning and 120 mg at night.  Please contact your cardiologist, Dr. Deatra Canter, as soon as possible to schedule an appointment for Monday 07/27/2017.  Please return to the emergency department if you experience any worsening symptoms or have any other concerns.

## 2017-07-23 NOTE — ED Nurses Note (Signed)
Pt up to bedside chair per her request. Alexandra Nunez and crackers provided with Dr. Janene Madeira permission. Denies any further complaints/needs at this time and remains asymptomatic.

## 2017-07-23 NOTE — Nurses Notes (Signed)
Pt arrived to CCU 4 via stretcher. Pt oriented to room and unit. Pt placed on cardiac monitor. Pt in afib HR 100-120s. Pt has no complaints of pain. Husband at bedside. All questions answered. Call light within reach. Will continue to monitor.

## 2017-07-23 NOTE — ED Provider Notes (Signed)
[facility]  Emergency Department  HPI - 07/23/2017    Chief Complaint:   Palpitations    History of Present Illness:   Alexandra Nunez, 62 y.o. female   Significant PMH: A-fib, uterine cancer   Patient presents to the emergency department for palpitations.  Patient was sent by her cardiologist after she was found to be in AFib with RVR further EKG.  Patient states that she has a history of atrial fibrillation and was initially being treated in [place] where she is from but just moved to the area 1 month ago.  She states that she is currently on Cardiem CD 120 mg b.i.d..  Patient states that she recently had this medication increased after she contacted her cardiology clinic back in [place].  Patient states that she believes she went into AFib approximately 1 week ago but states she is otherwise asymptomatic.  She denies any chest pain or shortness of breath.  Patient states that she is on Coumadin and get her levels checked frequently.  Patient states that she was previously on Pradaxa but stop the secondary to GI bleeding.  She also reports having to stop Xarelto secondary to cost.  Patient does not smoke, drink alcohol, use any recreational drugs.     History Limitations: None    Review of Systems:    Constitutional: No fever, chills weakness   Skin: No rashes, laceration or cut wounds   HENT: No headaches, congestion   Eyes: No vision changes, discharge   Cardio: No chest pain, or leg swelling +palpitations   Respiratory: No cough, wheezing or SOB   GI:  No nausea, vomiting, diarrhea, constipation   GU: No dysuria, hematuria, polyuria   MSK: No joint or back pain   Neuro: No loss of sensation, focal deficits   Psych: No mood changes.   All others negative except those mentioned in the HPI       Medications:  Prior to Admission Medications   Prescriptions Last Dose Informant Patient Reported? Taking?   Warfarin (COUMADIN) 6 mg Oral Tablet   No No   Sig: Take 1 Tab (6 mg total) by mouth Every  evening for 90 days 1 1/2 tablet on Wednesday and Friday and 1 tablet daily every other day.   albuterol sulfate (PROVENTIL OR VENTOLIN OR PROAIR) 90 mcg/actuation Inhalation HFA Aerosol Inhaler   Yes No   Sig: Take 2 Puffs by inhalation Every 6 hours as needed   buPROPion (WELLBUTRIN XL) 300 mg extended release 24 hr tablet   Yes No   Sig: Take 300 mg by mouth Once a day   budesonide-formoterol (SYMBICORT) 160-4.5 mcg/actuation Inhalation HFA Aerosol Inhaler   Yes No   Sig: Take 2 Puffs by inhalation Twice daily   clonazePAM (KLONOPIN) 1 mg Oral Tablet   Yes No   Sig: Take 1 mg by mouth Twice daily   dilTIAZem (CARDIZEM SR) 120 mg Oral Capsule, Sust. Release 12 hr   Yes No   Sig: Take 120 mg by mouth Once a day   furosemide (LASIX) 40 mg Oral Tablet   Yes No   Sig: Take 40 mg by mouth Once a day   hydrocortisone 2.5 % Cream   Yes No   Sig: Apply topically Twice daily   losartan (COZAAR) 100 mg Oral Tablet   Yes No   Sig: Take 100 mg by mouth Once a day   melatonin 1 mg Oral Tablet   Yes No   Sig: Take 2  mg by mouth Every night   methocarbamol (ROBAXIN) 500 mg Oral Tablet   Yes No   Sig: Take 500 mg by mouth Three times a day      Facility-Administered Medications: None       Allergies:  Allergies   Allergen Reactions   . Ace Inhibitors    . Peanut Butter Flavor [Flavoring Agent]    . Prednisone      High doses   . Protonix [Pantoprazole]    . Sulfa (Sulfonamides)        Past Medical History:  Past Medical History:   Diagnosis Date   . Asthma    . Atrial fibrillation (CMS HCC)    . Chronic joint pain    . CKD (chronic kidney disease) stage 3, GFR 30-59 ml/min (CMS HCC)    . Essential hypertension    . GERD (gastroesophageal reflux disease)    . Obesity    . Osteoarthritis    . Uterine cancer (CMS [facility])            Past Surgical History:  Past Surgical History:   Procedure Laterality Date   . COLONOSCOPY     . HX APPENDECTOMY     . HX DILATION AND CURETTAGE     . LAPAROSCOPIC TOTAL HYSTERECTOMY             Social  History:  Social History     Socioeconomic History   . Marital status: Married     Spouse name: Not on file   . Number of children: Not on file   . Years of education: Not on file   . Highest education level: Not on file   Occupational History   . Not on file   Social Needs   . Financial resource strain: Not on file   . Food insecurity:     Worry: Not on file     Inability: Not on file   . Transportation needs:     Medical: Not on file     Non-medical: Not on file   Tobacco Use   . Smoking status: Never Smoker   . Smokeless tobacco: Never Used   Substance and Sexual Activity   . Alcohol use: Never     Frequency: Never   . Drug use: Not on file   . Sexual activity: Not on file   Lifestyle   . Physical activity:     Days per week: Not on file     Minutes per session: Not on file   . Stress: Not on file   Relationships   . Social connections:     Talks on phone: Not on file     Gets together: Not on file     Attends religious service: Not on file     Active member of club or organization: Not on file     Attends meetings of clubs or organizations: Not on file     Relationship status: Not on file   . Intimate partner violence:     Fear of current or ex partner: Not on file     Emotionally abused: Not on file     Physically abused: Not on file     Forced sexual activity: Not on file   Other Topics Concern   . Not on file   Social History Narrative   . Not on file       Family History:  Family Medical History:     Problem Relation (Age  of Onset)    Coronary Artery Disease Mother, Father    HTN <20 y.o. Mother, Father              Physical Exam:  All nurse's notes reviewed.  ED Triage Vitals [07/23/17 1618]   Enc Vitals Group      BP (Non-Invasive) 95/62      Heart Rate 87      Respiratory Rate 18      Temperature 36.3 C (97.4 F)      Temp src       SpO2 96 %      Weight (!) 147.9 kg (326 lb)      Height 1.6 m (5\' 3" )        Patient does not need supplemental oxygen     Constitutional: NAD. A+Ox3   HENT:    Head: NC  AT    Mouth/Throat: Oropharynx is clear and moist.    Eyes: PERRL, EOMI, Conjunctivae without discharge   Neck: Trachea midline.    Cardiovascular: Irregularly irregular No murmurs, rubs or gallops.    Pulmonary/Chest: BS equal bilaterally, good air movement. No respiratory distress. No wheezes, rales or chest tenderness.     Abdominal: BS +. Abdomen soft, no tenderness, rebound or guarding.                Musculoskeletal: No obvious deformity, swelling.  No tenderness   Skin: Warm and dry. No rash, erythema, pallor or cyanosis   Psychiatric: Behavior is normal. Mood and affect congruent.     Neurological: Alert&Ox3. Grossly intact.     Labs:  Results for orders placed or performed during the hospital encounter of 07/23/17 (from the past 24 hour(s))   BASIC METABOLIC PANEL   Result Value Ref Range    SODIUM 142 136 - 144 mmol/L    POTASSIUM 3.3 (L) 3.6 - 5.1 mmol/L    CHLORIDE 107 101 - 111 mmol/L    CO2 TOTAL 26 22 - 32 mmol/L    ANION GAP 9 5 - 15 mmol/L    CALCIUM 9.0 8.9 - 10.3 mg/dL    GLUCOSE 93 70 - 110 mg/dL    BUN 13 8 - 26 mg/dL    CREATININE 0.78 0.60 - 1.10 mg/dL    BUN/CREA RATIO 17 6 - 20    ESTIMATED GFR >60 >60 mL/min/1.68m^2   CBC/DIFF    Narrative    The following orders were created for panel order CBC/DIFF.  Procedure                               Abnormality         Status                     ---------                               -----------         ------                     CBC WITH DIFF[[id number]]                Abnormal            Final result                 Please view results for these  tests on the individual orders.   PT/INR   Result Value Ref Range    PROTHROMBIN TIME 24.7 (H) 12.5 - 14.1 seconds    INR 2.23 (H) 0.92 - 1.08   PTT (PARTIAL THROMBOPLASTIN TIME)   Result Value Ref Range    APTT 42.8 (H) 22.8 - 35.2 seconds    Narrative    aPTT THERAPEUTIC RANGE -- STD UF (61.2 - 99.4 seconds)                            MI-Thrombo Therapy (42.1 - 61.2 seconds)   TROPONIN-I      Result Value Ref Range    TROPONIN I <0.02 0.00 - 0.02 ng/mL   CBC WITH DIFF   Result Value Ref Range    WBC 7.1 4.4 - 11.0 x10^3/uL    RBC 5.14 4.40 - 5.40 x10^6/uL    HGB 15.5 (H) 12.0 - 15.3 g/dL    HCT 46.0 (H) 35.9 - 44.6 %    MCV 89.5 80.0 - 96.0 fL    MCH 30.1 27.5 - 33.2 pg    MCHC 33.7 31.6 - 35.5 g/dL    RDW 13.8 (H) 10.5 - 13.5 %    PLATELETS 295 150 - 450 x10^3/uL    MPV 8.6 6.7 - 10.4 fL    NEUTROPHIL % 67 %    LYMPHOCYTE % 20 %    MONOCYTE % 6 %    EOSINOPHIL % 7 %    BASOPHIL % 1 %    NEUTROPHIL # 4.72 1.50 - 7.50 x10^3/uL    LYMPHOCYTE # 1.43 0.90 - 4.00 x10^3/uL    MONOCYTE # 0.40 0.00 - 0.80 x10^3/uL    EOSINOPHIL # 0.48 0.00 - 0.50 x10^3/uL    BASOPHIL # 0.07 0.00 - 0.20 x10^3/uL       Imaging:    Results for orders placed or performed during the hospital encounter of 07/23/17 (from the past 72 hour(s))   XR CHEST PA AND LATERAL     Status: None    Narrative    Maresha Rotundo    PROCEDURE DESCRIPTION: XR CHEST PA AND LATERAL    PROCEDURE PERFORMED DATE AND TIME: 07/23/2017 5:14 PM    CLINICAL INDICATION: palpitations    TECHNIQUE: 2 views / 2 images submitted.    COMPARISON: No prior studies were compared.      FINDINGS: The heart is normal in size. There is minimal posterior inferior  pleural thickening on the lateral view probably on the left. The lungs are  otherwise clear.      Impression    Mild posterior pleural changes probably on the left likely some pleural  thickening. No overt edema or pneumonia detected.        Radiologist location ID: [id number]         Orders Placed This Encounter   . XR CHEST PA AND LATERAL   . BASIC METABOLIC PANEL   . CBC/DIFF   . PT/INR   . PTT (PARTIAL THROMBOPLASTIN TIME)   . TROPONIN-I   . CBC WITH DIFF   . ECG 12 LEAD   . NS bolus infusion 1,000 mL   . dilTIAZem (CARDIZEM) 5 mg/mL injection   . digoxin (LANOXIN) 250 mcg/mL injection       Abnormal Lab results:  Labs Reviewed   BASIC METABOLIC PANEL - Abnormal; Notable for the following components:       Result  Value  POTASSIUM 3.3 (*)     All other components within normal limits   PT/INR - Abnormal; Notable for the following components:    PROTHROMBIN TIME 24.7 (*)     INR 2.23 (*)     All other components within normal limits   PTT (PARTIAL THROMBOPLASTIN TIME) - Abnormal; Notable for the following components:    APTT 42.8 (*)     All other components within normal limits    Narrative:     aPTT THERAPEUTIC RANGE -- STD UF (61.2 - 99.4 seconds)                            MI-Thrombo Therapy (42.1 - 61.2 seconds)   CBC WITH DIFF - Abnormal; Notable for the following components:    HGB 15.5 (*)     HCT 46.0 (*)     RDW 13.8 (*)     All other components within normal limits   TROPONIN-I - Normal   CBC/DIFF    Narrative:     The following orders were created for panel order CBC/DIFF.  Procedure                               Abnormality         Status                     ---------                               -----------         ------                     CBC WITH DIFF[[id number]]                Abnormal            Final result                 Please view results for these tests on the individual orders.       ECG:  AFib with RVR rate of 135.  No ischemic changes concerning for MI.    Plan: Appropriate labs and imaging ordered. Medical Records reviewed.    Therapy/Procedures/Course/MDM:    Patient was initially tachycardic upon arrival with an irregularly irregular rhythm.  Otherwise widely stable.   Patient given a dose of 20 mg Cardizem initially with improvement in her heart rate.  Patient maintaining her a between high 80s and low 120s.   On re-evaluation patient still remained asymptomatic.  Patient does not complain of any chest pain or shortness of breath.     Lab results unremarkable.  Troponin less than 0.02.  Chest x-ray negative for acute process per Radiology.  It was noted patient's heart rate started to increase during her observation time in the emergency department.  Patient was initially going to be  re-dosed with another 20 mg of Cardizem but I discussed the case with Dr. Pablo Ledger at [facility] cardiology who recommends patient be given digoxin and observed.  Patient did receive approximately 5 mg of Cardizem prior to discontinuing the 2nd bolus.  Since the patient is asymptomatic and is noted to have improvement with IV medications. I feel that she may be discharged home if we can maintain her heart rate less  than 110 and she remains vitally stable.  I discussed my concerns with Dr. Pablo Ledger who is agreeable to have the patient discharged home if she meets the above criteria.  He recommended the patient have her home dose of Cardizem increased.  Patient states that she is currently taking 240 mg of Cardizem daily.  I advised her to increase her dose to 360 mg daily.  She was advised to follow up with her cardiologist, Dr. Deatra Canter on Monday.   Patient loaded with Digoxin as recommended by Cardiology. Awaiting observation period. Discussed the case with Dr. Janene Madeira who will assume care of this patient.    Results discussed with patient.  They had improvement with initial ED management. They were given the opportunity to ask questions.    Consults: Dr. Pablo Ledger, [facility] Cardiology  Critical Care Time: None  Impression:   Encounter Diagnosis   Name Primary?   . Atrial fibrillation with rapid ventricular response (CMS HCC) Yes     Disposition:      Care of patient will be transferred to Dr. Janene Madeira at this time.  They were made aware of history/physical, relevant labs/imaging and pending studies.            Truitt Merle, MD  07/23/2017, 16:43  [facility] Emergency Medicine

## 2017-07-23 NOTE — ED Nurses Note (Signed)
Report received from [facility] at shift change.

## 2017-07-23 NOTE — Progress Notes (Signed)
CARDIOLOGY, [place]  [address]  [place] [address]  Phone: [phone]  Fax: [phone]    Encounter Date: 07/23/2017    Patient ID:  Alexandra Nunez  LOV:[id number]    DOB: 05-Dec-1955  Age: 62 y.o. female    Subjective:     Chief Complaint   Patient presents with   . Referrals     Alexandra Nunez for A-fib     62 year old lady with history of paroxysmal atrial fibrillation who was initially started on the normal anticoagulants but is now on Coumadin on account of cost considerations.  She notes that a few weeks ago she started feeling unwell like she is back in AFib.  She called a previous cardiologist is [place] state and the ulcer to take Cardizem twice a day after checking the pulse to make sure it is not too slow.  She has been doing this but she still notes that her heart is pounding and racing and she is feeling dizzy at times.  She also gets winded on walking a short distance.  She has noted a stomach to be upset quite a bit lately with nausea and throwing up sensation and has been given prescriptions for this.  She notes that she has developed cellulitis of the left leg a couple years ago and legs is still swollen but there is no leg pain.  She has not noticed any varicose veins.          Current Outpatient Medications   Medication Sig   . albuterol sulfate (PROVENTIL OR VENTOLIN OR PROAIR) 90 mcg/actuation Inhalation HFA Aerosol Inhaler Take 2 Puffs by inhalation Every 6 hours as needed   . budesonide-formoterol (SYMBICORT) 160-4.5 mcg/actuation Inhalation HFA Aerosol Inhaler Take 2 Puffs by inhalation Twice daily   . buPROPion (WELLBUTRIN XL) 300 mg extended release 24 hr tablet Take 300 mg by mouth Once a day   . clonazePAM (KLONOPIN) 1 mg Oral Tablet Take 1 mg by mouth Twice daily   . dilTIAZem (CARDIZEM SR) 120 mg Oral Capsule, Sust. Release 12 hr Take 120 mg by mouth Once a day   . furosemide (LASIX) 40 mg Oral Tablet Take 40 mg by mouth Once a day   . hydrocortisone 2.5 % Cream  Apply topically Twice daily   . losartan (COZAAR) 100 mg Oral Tablet Take 100 mg by mouth Once a day   . melatonin 1 mg Oral Tablet Take 2 mg by mouth Every night   . methocarbamol (ROBAXIN) 500 mg Oral Tablet Take 500 mg by mouth Three times a day   . Warfarin (COUMADIN) 6 mg Oral Tablet Take 1 Tab (6 mg total) by mouth Every evening for 90 days 1 1/2 tablet on Wednesday and Friday and 1 tablet daily every other day.     Allergies   Allergen Reactions   . Ace Inhibitors    . Peanut Butter Flavor [Flavoring Agent]    . Prednisone      High doses   . Protonix [Pantoprazole]    . Sulfa (Sulfonamides)      Past Medical History:   Diagnosis Date   . Asthma    . Atrial fibrillation (CMS HCC)    . Chronic joint pain    . CKD (chronic kidney disease) stage 3, GFR 30-59 ml/min (CMS HCC)    . Essential hypertension    . GERD (gastroesophageal reflux disease)    . Obesity    . Osteoarthritis    .  Uterine cancer (CMS [facility])          Past Surgical History:   Procedure Laterality Date   . COLONOSCOPY     . HX APPENDECTOMY     . HX DILATION AND CURETTAGE     . LAPAROSCOPIC TOTAL HYSTERECTOMY           Family Medical History:     Problem Relation (Age of Onset)    Coronary Artery Disease Mother, Father    HTN <20 y.o. Mother, Father            Social History     Tobacco Use   . Smoking status: Never Smoker   . Smokeless tobacco: Never Used   Substance Use Topics   . Alcohol use: Never     Frequency: Never   . Drug use: Not on file       Review of Systems   HENT: Negative for nosebleeds.    Eyes: Negative for visual disturbance.   Respiratory: Positive for shortness of breath. Negative for cough and chest tightness.    Cardiovascular: Positive for palpitations and leg swelling. Negative for chest pain.   Neurological: Positive for dizziness and light-headedness. Negative for syncope and speech difficulty.   Psychiatric/Behavioral: Negative for confusion.   All other systems reviewed and are negative.    Objective:   Vitals: BP 100/60    Pulse (!) 120   Temp 37 C (98.6 F) (Oral)   Resp (!) 28   Ht 1.626 m (5\' 4" )   Wt (!) 147.9 kg (326 lb)   SpO2 96%   BMI 55.96 kg/m         Physical Exam   Constitutional: She is oriented to person, place, and time. She appears well-developed and well-nourished.   HENT:   Head: Normocephalic and atraumatic.   Eyes: Pupils are equal, round, and reactive to light. EOM are normal.   Neck: Normal range of motion. Neck supple. No JVD present.   Cardiovascular: Normal rate, normal heart sounds and intact distal pulses.   No murmur heard.  Pulmonary/Chest: Breath sounds normal. She is in respiratory distress.   Abdominal: Soft. Bowel sounds are normal.   Musculoskeletal: Normal range of motion. She exhibits edema. She exhibits no tenderness.   Neurological: She is alert and oriented to person, place, and time. Coordination normal.   Nursing note and vitals reviewed.        Cardiology assessment 2016  Assessment/Plan:  1. HTN - BP by me is fair. She is off of her Diltiazem. Not able to afford. Would stay on her current regimen. See back in 3 months. Increase ACE if needed.     2. Paroxysmal atrial fibrillation: No recurrence. She is tolerating Xarelto. Needs surveillance labs    3. Endometrial cancer: FU with oncology.    4. Depression - seems to be the most pressing issue at this time    5. Obesity - has lost 20 pounds. Encouraged her to continue with her efforts at weight loss.    Current medicines are reviewed with the patient today. The patient does not have concerns regarding medicines other than what has been noted above.    The following changes have been made: See above.    Labs/ tests ordered today include:     Orders Placed This Encounter   Procedures   . Basic metabolic panel   . CBC   . TSH     Disposition: FU with me in 3 months  Patient is agreeable to this plan and will call if any problems develop in the interim.     Signed:  Burtis Junes, RN, ANP-C  05/23/2014 3:00 PM    [facility]  [address] [place] [address]  Phone: [phone]  Fax: [phone]             Oncology assessment 2016  IMPRESSION/PLAN:  1. Endometrial cancer. The patient has now received medical clearance. We are planning to proceed to the operating room for an attempted robotic hysterectomy with bilateral salpingo-oophorectomy. The patient is aware again of my concerns with her weight and potential implications this will have on our ability to proceed with this type of surgery. While I'm willing to attempt this, the patient is aware that if she is not tolerating the procedure, we may need to abort things to avoid significant complications that could occur if we try to move on with the surgery and she is not tolerating it. My hope is that we can proceed with the robotic hysterectomy and bilateral salpingo-oophorectomy to remove the uterus and advised the patient that again given the high BMI in the distribution of her weight primarily in the abdomen and pelvis, the likelihood of surgical staging is extraordinarily low given the potential risks associated with this. I did explain to the patient again that if she is unable to tolerate this minimally invasive approach, the surgery will be aborted and we will discuss other treatment options for her cancer which at this point would include radiation therapy. The rationale for proceeding in this manner was explained in detail to the patient in the reason behind the expectations around surgery were also discussed in detail. Patient understands that the morbidity of a laparotomy in light of her disease is much greater than the benefit and would be highly morbid were we to take that route. All questions were answered to the satisfaction of the patient.  2. Obesity with a BMI of 59.74. The patient is aware that her weight continues to play significant role in terms of the potential medical complications and surgical complications  that can occur. We've encouraged her on multiple visits to consider weight loss. We will continue this conversation with her postop period.  3. Back pain. Patient will continue with symptomatic management of her pain. No prescriptions for pain medicines are provided today.    Greater than 25 minutes of face to face time was spent with the patient of which > 50 % of this time was spent directly counseling the patient on the treatment recommendations as outlined above.    Benjamine Mola in [place], M.D.           Assessment & Plan:     ENCOUNTER DIAGNOSES     ICD-10-CM   1. Essential hypertension I10   2. Atrial fibrillation (CMS HCC) I48.91     Paroxysmal atrial fibrillation  Discussed various treatment modalities the patient including cardioversion ablation Watchman device.  Patient verbalized understanding.  At the current time patient prefers to pursue the rate control strategy.  Given her an event monitor to see how the rate control is working out and adjust medications accordingly.  Obtain echocardiogram to assess LV systolic function.  Will obtain basic blood work to make sure that she does not have any marked dyselectrolytemeia.    History of left lower extremity cellulitis and bilateral pedal edema  Refer to the Vein Clinic.    Orders  Placed This Encounter   . COMPREHENSIVE METABOLIC PANEL, NON-FASTING   . CBC/DIFF   . ECG WITH INTERPRETATION/REPORT (AMB ONLY)   . [place] Transthoracic Echocardiogram Adult     This note may have been partially generated using MModal Fluency Direct system, and there may be some incorrect words, spellings, and punctuation that were not noted in checking the note before saving.    Return in about 6 weeks (around 09/03/2017).    Leo Grosser, MD

## 2017-07-23 NOTE — Nurses Notes (Signed)
Pt afib HR 130s on cardiac monitor. BP stable. Cardizem gtt titrated to 10mg /hr. Will continue to monitor.

## 2017-07-23 NOTE — ED Nurses Note (Signed)
To ccu via wc

## 2017-07-23 NOTE — ED Nurses Note (Signed)
Pt ambulated to restroom and back to bed without incident

## 2017-07-23 NOTE — ED Nurses Note (Signed)
Report given to CCU

## 2017-07-23 NOTE — ED Triage Notes (Signed)
Symptoms for "about 7 days" increased SOB with exertion, dizziness, appt with Dr Deatra Canter today and sent for further evaluation from office

## 2017-07-23 NOTE — ED Nurses Note (Signed)
Patient resting in bed with family at bedside. No concerns/pain or needs at this time.

## 2017-07-24 ENCOUNTER — Encounter (HOSPITAL_COMMUNITY): Payer: Self-pay | Admitting: Internal Medicine

## 2017-07-24 ENCOUNTER — Telehealth (HOSPITAL_COMMUNITY): Payer: Self-pay | Admitting: *Deleted

## 2017-07-24 MED ORDER — WARFARIN 6 MG TABLET
6.00 mg | ORAL_TABLET | ORAL | Status: DC
Start: 2017-07-25 — End: 2017-07-25
  Administered 2017-07-25: 6 mg via ORAL

## 2017-07-24 MED ORDER — DILTIAZEM CD 120 MG CAPSULE,EXTENDED RELEASE 24 HR
120.0000 mg | ORAL_CAPSULE | Freq: Once | ORAL | Status: AC
Start: 2017-07-24 — End: 2017-07-24
  Administered 2017-07-24: 120 mg via ORAL

## 2017-07-24 MED ORDER — MORPHINE 2 MG/ML INJECTION WRAPPER
0.50 mg | INJECTION | INTRAMUSCULAR | Status: AC
Start: 2017-07-24 — End: 2017-07-24
  Administered 2017-07-24: 0.5 mg via INTRAVENOUS
  Filled 2017-07-24: qty 1

## 2017-07-24 MED ORDER — WARFARIN 6 MG TABLET
9.00 mg | ORAL_TABLET | ORAL | Status: DC
Start: 2017-07-24 — End: 2017-07-25
  Administered 2017-07-24: 9 mg via ORAL

## 2017-07-24 MED ORDER — DILTIAZEM CD 120 MG CAPSULE,EXTENDED RELEASE 24 HR
360.0000 mg | ORAL_CAPSULE | Freq: Every day | ORAL | Status: DC
Start: 2017-07-25 — End: 2017-07-25
  Administered 2017-07-25: 360 mg via ORAL

## 2017-07-24 MED ORDER — DILTIAZEM CD 120 MG CAPSULE,EXTENDED RELEASE 24 HR
240.0000 mg | ORAL_CAPSULE | Freq: Every day | ORAL | Status: DC
Start: 2017-07-24 — End: 2017-07-24
  Administered 2017-07-24: 240 mg via ORAL

## 2017-07-24 MED ORDER — BUDESONIDE-FORMOTEROL HFA 160 MCG-4.5 MCG/ACTUATION AEROSOL INHALER - RN
2.00 | Freq: Two times a day (BID) | Status: DC
Start: 2017-07-24 — End: 2017-07-25
  Administered 2017-07-24 – 2017-07-25 (×2): 2 via RESPIRATORY_TRACT

## 2017-07-24 MED ORDER — ACETAMINOPHEN 500 MG TABLET
1000.00 mg | ORAL_TABLET | Freq: Four times a day (QID) | ORAL | Status: DC | PRN
Start: 2017-07-24 — End: 2017-07-25
  Administered 2017-07-24 – 2017-07-25 (×3): 1000 mg via ORAL
  Filled 2017-07-24 (×3): qty 2

## 2017-07-24 MED ORDER — DILTIAZEM 5 MG/ML INTRAVENOUS SOLUTION
INTRAVENOUS | Status: AC
Start: 2017-07-24 — End: 2017-07-25
  Administered 2017-07-24: 0 mg
  Filled 2017-07-24: qty 25

## 2017-07-24 MED ADMIN — sodium chloride 0.9 % (flush) injection syringe: INTRAVENOUS | @ 10:00:00

## 2017-07-24 NOTE — Nurses Notes (Signed)
Pt AAOx3 and pleasant.  Spouse at bedside.  Pt remains in A-fib.  Denies any pain or SOB at this time.  Vitals normal and stable.  1+ edema BLE with blistered looking area to shin of RLE.     07/24/17 1950   Cognitive   Cognitive/Neuro/Behavioral WDL WDL   Glasgow Coma Scale   Best Eye Response 4-->(E4) spontaneous   Best Motor Response 6-->(M6) obeys commands   Best Verbal Response 5-->(V5) oriented   Glasgow Coma Scale Score 15   Pain   Pain Assessment Assessment   Pain Scale Used N   Rationale for Non-Scoring Patient resting - eyes closed and respirations WDL   Pain Score (Numeric, Faces) 0   Pain/Comfort/Sleep   Fever Reduction/Comfort Measures room temperature adjusted   [facility] Fall Risk Assessment Tool   Fall Risk Assessment Shift Assessment   Fall Risk- Implement fall risk interventions per protocol 0   Age 62   Fall History 0   Elimination, Bowel and Urine 0   Medications (Fall risk drugs: PCA/Opiates, Anti-Convulsants, Anti-Hypertensives, Diuretics, Hypnotics, Laxatives, Sedatives and Psychotropics) 3   Patient Care Equipment 1   Mobility (Yes on any of these rows will result in High Fall Risk)   Requires assistance or supervision for mobility, transfer, or ambulation 0   Unsteady Gait 0   Visual or auditory impairment affecting mobility 0   Cognition (Yes on any of these rows will result in High Fall Risk)   Altered awareness of immediate physical environment 0   Impulsive 0   Lack of understanding of one's physical and cognitive limitations 0   [facility] Score and Interventions   [facility] Score Total 5   Identified Fall Risk Level Moderate Risk (6-13 total points & absence of Yes in Mobility and Cognition Rows)   Fall Risk Interventions (All That Apply) (L,M,H) Universal Falls Precautions   Safety   Safety Factors bed in low position;wheels locked;call light in reach;upper side rails raised x 2;ID band on   ID Band/ Code Status (1st) Checked   All Alarms alarm(s) activated and audible      Hourly rounds completed Yes   Safety Precautions emergency equipment at bedside   Safety/Security Measures bed alarm refused;family to remain at bedside   Safety Promotion/Fall Prevention activity supervised;fall prevention program maintained;nonskid shoes/slippers when out of bed;safety round/check completed   Environmental Safety Modification assistive device/personal items within reach;clutter free environment maintained;lighting adjusted;room organization consistent;room near unit station   Medication Review/Management medications reviewed   Precautions   Precautions Fall   MRSA last 6 months Has history of MRSA, but NO positive MRSA cultures within the past 6 months   LIFT Tool Screening   Patient requires repositioning assistance, is unresponsive, on strict bedrest, or is high risk for skin breakdown No   Can patient stand, pivot, and walk with no physical assistance from staff? Yes: No equipment needed at this time   HEENT   HEENT WDL WDL   Mouth/Teeth WDL   Mouth/Teeth WDL WDL   Cardiac   Cardiac WDL ex;all   Cardiac Rhythm apical pulse irregular   Telemetry Monitor On Yes   Telemetry Audible Yes   Telemetry Alarms Set Yes   Telemetry Box Number [id number]   ECG   Lead Monitored Lead II;V1   Rhythm atrial rhythm   Atrial Rhythm atrial fibrillation   QRS Interval (Sec) 0.08   Peripheral Neurovascular   Peripheral Neurovascular WDL ex;edema   VTE Prevention/Management anticoagulant therapy  maintained   Edema leg, right;leg, left   Leg, Left Edema 1+ (Trace)   Leg, Right Edema 1+ (Trace)   Respiratory   Respiratory WDL WDL   Nailbeds no discoloration   Cough And Deep Breathing done independently per patient   Breath Sounds   L General Breath Sounds Clear;Diminished   R General Breath Sounds Clear;Diminished   Throughout All Lung Fields All Fields        High Flow Nasal Canula   Respiratory Rate 18        Respiratory Pre/Post-Treatment Assess   SpO2 95 %   Nutrition   Diet/Nutrition Received regular    Gastrointestinal   GI WDL ex;appearance/characteristics   Abdominal Appearance obese   Last Bowel Movement 07/22/17   Genitourinary   Genitourinary WDL WDL   Appearance Clear   Urine Color Amber   Musculoskeletal   Musculoskeletal WDL ex;mobility   General Mobility generalized weakness   Daily Care   Activity Management activity adjusted per tolerance;activity encouraged;ambulated in room   Positioning   Body Position neutral head position, midline maintained   Head of Bed (HOB) HOB at 45 degrees   Daily Care Interventions   Self-Care Promotion independence encouraged   Skin   Skin WDL ex;characteristics   Skin Color/Characteristics shiny  (BLE)   Skin Temperature warm   Skin Moisture dry   Skin Elasticity slow return to original state   Skin Integrity blister;other (see comments)  (on shin of RLE)   Braden Risk Assessment   Sensory Perception 4-->no impairment   Moisture 4-->rarely moist   Activity 3-->walks occasionally   Mobility 3-->slightly limited   Nutrition 3-->adequate   Friction and Shear 3-->no apparent problem   Braden Score 20   Skin Interventions   Skin Protection tubing/devices free from skin contact   Coping/Psychosocial   Observed Emotional State accepting;calm;cooperative;pleasant   Verbalized Emotional State acceptance   Plan of Care Reviewed With patient;spouse   [place] Relationship/Rapport care explained;choices provided;emotional support provided;empathic listening provided;questions answered;questions encouraged;thoughts/feelings acknowledged;reassurance provided   Diversional Activities television;smartphone   Family/Support System Care involvement promoted;self-care encouraged   Family/Support System   Family/Support Persons spouse   Involvement in Care at bedside

## 2017-07-24 NOTE — Care Management Notes (Signed)
[facility] Management Initial Evaluation    Patient Name: Alexandra Nunez  Date of Birth: 1955-07-01  Sex: female  Date/Time of Admission: 07/23/2017  4:24 PM  Room/Bed: CCU4/A  Payor: [place] / Plan: [place] MEDICARE ADVANTAGE PPO / Product Type: PPO /   PCP: Gavin Potters, APRN    Pharmacy Info:   Preferred Pharmacy     [facility] [place].    [address] [place] [address]    Phone: [phone] Fax: [phone]    Not a 24 hour pharmacy; exact hours not known        Emergency Contact Info:   Extended Emergency Contact Information  Primary Emergency Contact: TOM [facility]  Mobile Phone: [phone]  Relation: None    History:   Alexandra Nunez is a 62 y.o., female, admitted CCU observation    Height/Weight: 162.6 cm (5\' 4" ) / (!) 148.1 kg (326 lb 6.4 oz)     LOS: 0 days   Admitting Diagnosis: Atrial fibrillation with rapid ventricular response (CMS HCC) [I48.91]    Assessment:   Case reviewed using MCG guidelines for AFIB, meets observation status/LOC. Will follow.    Discharge Plan:  Home (Patient/Family Member/other) (code 1)      The patient will continue to be evaluated for developing discharge needs.     Case Manager: Letitia Neri, CASE MANAGER 07/24/2017, 12:16  Phone:

## 2017-07-24 NOTE — Nurses Notes (Signed)
Sister Arsenio Loader in as requested by the patient.

## 2017-07-24 NOTE — Nurses Notes (Signed)
Dr. Owens Shark anticipates starting PO cardizem soon. Decreased cardizem gtt to 5mg /hr at this time. Dr. Owens Shark in to see patient again to discuss plan of care.

## 2017-07-24 NOTE — Nurses Notes (Signed)
Pt resting in bed. HR more controlled in the last half hour, 80s-low 100s

## 2017-07-24 NOTE — Nurses Notes (Signed)
Pt up to [facility]. HR hit 150. Back down 90s-120s at this time. Dr. Owens Shark notified.

## 2017-07-24 NOTE — Care Plan (Addendum)
Off Cardizem gtt this AM. Remains AFIB on monitor, HR varies- mostly 80s-low 100s but increases with activity. Has received total of 360mg  PO cardizem. BP stable. Continue to monitor.      Problem: Adult Inpatient Plan of Care  Goal: Plan of Care Review  Outcome: Ongoing (see interventions/notes)  Goal: Patient-Specific Goal (Individualization)  Outcome: Ongoing (see interventions/notes)  Goal: Absence of Hospital-Acquired Illness or Injury  Outcome: Ongoing (see interventions/notes)  Goal: Optimal Comfort and Wellbeing  Outcome: Ongoing (see interventions/notes)  Goal: Rounds/Family Conference  Outcome: Ongoing (see interventions/notes)     Problem: Arrhythmia/Dysrhythmia  Goal: Normalized Cardiac Rhythm  Outcome: Ongoing (see interventions/notes)     Problem: Fall Injury Risk  Goal: Absence of Fall and Fall-Related Injury  Outcome: Ongoing (see interventions/notes)

## 2017-07-24 NOTE — Nurses Notes (Signed)
Pt resting in bed. AFIB on monitor, HR variable, 70s-100. Rolls over in bed and HR up to 120 briefly. Cardizem drip infusing at 15mg /hr. Dr. Owens Shark in to see patient.

## 2017-07-24 NOTE — Nurses Notes (Signed)
Pt resting quietly with eyes closed, appears to be sleeping. Pt afib on monitor. HR 90-110s. Occasionally increased to 130s for a second and then decreased back to 90-110s. BP stable. Call light within reach. Will continue to monitor.

## 2017-07-24 NOTE — Nurses Notes (Addendum)
HR continues to be variable on monitor, mostly 70s-90s but down in 60's at times. Decreased cardizem gtt to 10mg /hr at this time.

## 2017-07-24 NOTE — Nurses Notes (Signed)
Pt sitting up on side of bed eating lunch. HR increased with activity, mostly 90-120. Has hit 130s and 140s briefly. Dr. Owens Shark notified.

## 2017-07-24 NOTE — Care Management Notes (Signed)
MOON notice presented and explained to patient. Signed by patient and verbalized understanding.

## 2017-07-24 NOTE — H&P (Signed)
[facility] -- Ad[facility]    Name: Alexandra Nunez  Age: 62 y.o.   Gender: female  MRN: [id number]  Date of Admission:  07/23/2017  PCP: Gavin Potters, APRN  Attending: Timmie Foerster, DO  Consultants:     Chief Complaint: A-fib with RVR  Chief Complaint   Patient presents with   . Arrhythmia     hx of afib, sent by Dr Deatra Canter       HPI:  This is 62 y.o. female who has been admitted from the ER for A-fib with RVR.  The patient recently moved to [facility] around a month ago, and states that she has had increased stress due to the move and she has been spending more time with her mother and drinking more coffee than she is used to.  About 4 or 5 days ago, she noticed that she did not feel well overall with some more shortness of breath, similar to previous episodes of A-fib she had in the past, that would usually resolve on its own after a day or so.  She had an appointment yesterday with Dr. Deatra Canter, her new cardiologist, and was found to be in A-fib with RVR.  She was sent to the ER.  There she was given diltiazem and also given a bolus of Digoxin, she failed to slow out of RVR.  She was started on the diltiazem gtt and admitted to the unit to continue gtt and monitor heart rate.  Patient this am reports that she can tell her rate is better, running from 70-110 at time of exam, on the higher side when she moves about in bed.  She states that she is feeling better overall, no shortness of breath.  She reports that she has a long history of taking Norco for her arthritis in her legs, but has not found anyone since moving to [facility] to right her medication other than tramadol, which she reports does not help her pain.  She states she did not sleep well last night due to her pain in her legs and is requesting something for pain this morning.    History:  Past Medical History:   Diagnosis Date   . Asthma    . Atrial fibrillation (CMS HCC)    . Chronic joint pain    . CKD (chronic kidney disease) stage 3, GFR 30-59  ml/min (CMS HCC)    . Essential hypertension    . GERD (gastroesophageal reflux disease)    . Obesity    . Osteoarthritis    . Uterine cancer (CMS [facility])          Social History     Substance and Sexual Activity   Drug Use Not on file     Allergies   Allergen Reactions   . Peanut Butter Flavor [Flavoring Agent]    . Prednisone      High doses   . Protonix [Pantoprazole]    . Sulfa (Sulfonamides)      Family Medical History:     Problem Relation (Age of Onset)    Coronary Artery Disease Mother, Father    HTN <20 y.o. Mother, Father            Social History     Tobacco Use   . Smoking status: Never Smoker   . Smokeless tobacco: Never Used   Substance Use Topics   . Alcohol use: Never     Frequency: Never   . Drug use: Not  on file       Review of Systems:  10 point ROS as obtained and is unremarkable except as stated in the HPI.  Filed Vitals:    07/24/17 0615 07/24/17 0630 07/24/17 0700 07/24/17 0730   BP: 110/71 110/71 106/75 114/77   Pulse: 78 82 99 85   Resp: 16 15 13 15    Temp:    36.4 C (97.5 F)   SpO2:           Physical Examination:  Today's Physical Exam:  Constitutional:  appears in good health, appears stated age and no distress  ENT:  ENMT without erythema or injection, mucous membranes moist, No oral lesions.   Neck:  no thyromegaly or lymphadenopathy and supple, symmetrical, trachea midline  Respiratory:  Clear to auscultation bilaterally.   Cardiovascular:  irregular rate and rhythm, no murmur, click, rub or gallop  Gastrointestinal:  Soft, morbidly obese, non-tender, Bowel sounds normal, No masses  Musculoskeletal:  Head atraumatic and normocephalic  Integumentary:  Skin warm and dry and No rashes  Neurologic:  Grossly normal, CN II - XII grossly intact , Alert and oriented x3  Psychiatric:  Normal affect, behavior, memory, thought content, judgement, and speech.  Recent Labs/Radiology:  Results for orders placed or performed during the hospital encounter of 07/23/17 (from the past 24 hour(s))   BASIC  METABOLIC PANEL   Result Value Ref Range    SODIUM 142 136 - 144 mmol/L    POTASSIUM 3.3 (L) 3.6 - 5.1 mmol/L    CHLORIDE 107 101 - 111 mmol/L    CO2 TOTAL 26 22 - 32 mmol/L    ANION GAP 9 5 - 15 mmol/L    CALCIUM 9.0 8.9 - 10.3 mg/dL    GLUCOSE 93 70 - 110 mg/dL    BUN 13 8 - 26 mg/dL    CREATININE 0.78 0.60 - 1.10 mg/dL    BUN/CREA RATIO 17 6 - 20    ESTIMATED GFR >60 >60 mL/min/1.22m^2   CBC/DIFF    Narrative    The following orders were created for panel order CBC/DIFF.  Procedure                               Abnormality         Status                     ---------                               -----------         ------                     CBC WITH DIFF[[id number]]                Abnormal            Final result                 Please view results for these tests on the individual orders.   PT/INR   Result Value Ref Range    PROTHROMBIN TIME 24.7 (H) 12.5 - 14.1 seconds    INR 2.23 (H) 0.92 - 1.08   PTT (PARTIAL THROMBOPLASTIN TIME)   Result Value Ref Range    APTT 42.8 (H) 22.8 - 35.2 seconds    Narrative  aPTT THERAPEUTIC RANGE -- STD UF (61.2 - 99.4 seconds)                            MI-Thrombo Therapy (42.1 - 61.2 seconds)   TROPONIN-I   Result Value Ref Range    TROPONIN I <0.02 0.00 - 0.02 ng/mL   CBC WITH DIFF   Result Value Ref Range    WBC 7.1 4.4 - 11.0 x10^3/uL    RBC 5.14 4.40 - 5.40 x10^6/uL    HGB 15.5 (H) 12.0 - 15.3 g/dL    HCT 46.0 (H) 35.9 - 44.6 %    MCV 89.5 80.0 - 96.0 fL    MCH 30.1 27.5 - 33.2 pg    MCHC 33.7 31.6 - 35.5 g/dL    RDW 13.8 (H) 10.5 - 13.5 %    PLATELETS 295 150 - 450 x10^3/uL    MPV 8.6 6.7 - 10.4 fL    NEUTROPHIL % 67 %    LYMPHOCYTE % 20 %    MONOCYTE % 6 %    EOSINOPHIL % 7 %    BASOPHIL % 1 %    NEUTROPHIL # 4.72 1.50 - 7.50 x10^3/uL    LYMPHOCYTE # 1.43 0.90 - 4.00 x10^3/uL    MONOCYTE # 0.40 0.00 - 0.80 x10^3/uL    EOSINOPHIL # 0.48 0.00 - 0.50 x10^3/uL    BASOPHIL # 0.07 0.00 - 0.20 x10^3/uL     XR CHEST PA AND LATERAL   Final Result   Mild posterior pleural  changes probably on the left likely some pleural   thickening. No overt edema or pneumonia detected.            Radiologist location ID: [id number]           No results found for this visit on 07/23/17 (from the past 720 hour(s)).      Assessment and Plan:  Atrial fibrillation with rapid ventricular response (CMS [facility])  Patient Active Problem List   Diagnosis   . CKD (chronic kidney disease) stage 3, GFR 30-59 ml/min (CMS HCC)   . Atrial fibrillation (CMS HCC)   . Essential hypertension   . Osteoarthritis   . Uterine cancer (CMS [facility])   . Atrial fibrillation with rapid ventricular response (CMS HCC)       - Patient placed in ICU.  Continue diltiazem gtt and monitor telemetry and adjust as able.  INR therapeutic, continue home dose coumadin.  Will monitor her heart rate and adjust medications based on response to diltiazem.      Timmie Foerster, DO

## 2017-07-24 NOTE — Nurses Notes (Addendum)
Pt resting in bed, watching tv. Pt in afib. HR 80-90s. Cardizem gtt infusing @ 15mg /hr. BP stable. Pt has no complaints at this time. Fresh ice water given. Call light within reach. Will continue to monitor.

## 2017-07-24 NOTE — Care Plan (Signed)
Problem: Adult Inpatient Plan of Care  Goal: Plan of Care Review  07/24/2017 0205 by Serena Colonel, RN  Outcome: Ongoing (see interventions/notes)  07/23/2017 2325 by Serena Colonel, RN  Outcome: Ongoing (see interventions/notes)  Goal: Patient-Specific Goal (Individualization)  07/24/2017 0205 by Serena Colonel, RN  Outcome: Ongoing (see interventions/notes)  07/23/2017 2325 by Serena Colonel, RN  Outcome: Ongoing (see interventions/notes)  Goal: Absence of Hospital-Acquired Illness or Injury  07/24/2017 0205 by Serena Colonel, RN  Outcome: Ongoing (see interventions/notes)  07/23/2017 2325 by Serena Colonel, RN  Outcome: Ongoing (see interventions/notes)  Goal: Optimal Comfort and Wellbeing  07/24/2017 0205 by Serena Colonel, RN  Outcome: Ongoing (see interventions/notes)  07/23/2017 2325 by Serena Colonel, RN  Outcome: Ongoing (see interventions/notes)  Goal: Rounds/Family Conference  07/24/2017 0205 by Serena Colonel, RN  Outcome: Ongoing (see interventions/notes)  07/23/2017 2325 by Serena Colonel, RN  Outcome: Ongoing (see interventions/notes)     Problem: Arrhythmia/Dysrhythmia  Goal: Normalized Cardiac Rhythm  07/24/2017 0205 by Serena Colonel, RN  Outcome: Ongoing (see interventions/notes)  07/23/2017 2325 by Serena Colonel, RN  Outcome: Ongoing (see interventions/notes)     Problem: Fall Injury Risk  Goal: Absence of Fall and Fall-Related Injury  07/24/2017 0205 by Serena Colonel, RN  Outcome: Ongoing (see interventions/notes)  07/23/2017 2325 by Serena Colonel, RN  Outcome: Ongoing (see interventions/notes)

## 2017-07-24 NOTE — Nurses Notes (Signed)
PT afib HR 130s. BP stable. Cardizem gtt titrated to 15mg /hr. Pt has no complaints. Call light within reach. Will continue to monitor.

## 2017-07-24 NOTE — Nurses Notes (Signed)
Stopped cardizem gtt 1 hour after administering PO cardizem. Pt remains AFIB on monitor, mostly 70s-90s. Pt has not been OOB.

## 2017-07-24 NOTE — Nurses Notes (Signed)
Pt sitting up on side of bed. Pt states her knees are hurting, and she thinks it because of the weather. Ice packs given per Pt request. Afib on monitor, HR 80-100. BP stable. Call light within reach. Will continue to monitor.

## 2017-07-24 NOTE — Nurses Notes (Signed)
Pt given an additional 120mg  PO cardizem per order

## 2017-07-24 NOTE — Nurses Notes (Signed)
Pt resting in bed now watching tv. HR mostly 80s-110s. Will still occasionally hit 120s-130s briefly. BP 140/88

## 2017-07-24 NOTE — Care Plan (Signed)
Problem: Adult Inpatient Plan of Care  Goal: Rounds/Family Conference  Outcome: Ongoing (see interventions/notes)  Flowsheets (Taken 07/24/2017 0844)  Participants: nursing;pharmacy;physician;respiratory therapy;social work/services   IV Cardizem drip. Monitoring labs.

## 2017-07-24 NOTE — Nurses Notes (Signed)
Pt states "I don't have any pain in my knees right now so the pain medicine must have helped." Pt continues to rest in bed. Ate breakfast and is talking with husband. Administered pt's home symbicort and coumadin per order.

## 2017-07-24 NOTE — Nurses Notes (Signed)
Given 240mg  PO Cardizem CD. Pt requesting increased PRN tylenol dose.

## 2017-07-24 NOTE — Nurses Notes (Signed)
Pt requesting Tylenol for bil knee pain and melatonin to sleep.  Medications given per prn order and ice packs applied to knees

## 2017-07-24 NOTE — Telephone Encounter (Signed)
Pt has relocated to [place], [place]. She is [facility] and is being established with cardiology there. Dr. Leo Grosser will be her new cardiologist.

## 2017-07-25 LAB — BASIC METABOLIC PANEL
BUN: 13 mg/dL (ref 8–26)
CALCIUM: 8 mg/dL — ABNORMAL LOW (ref 8.9–10.3)
CHLORIDE: 111 mmol/L (ref 101–111)
ESTIMATED GFR: >60 mL/min/{1.73_m2} (ref >60)
GLUCOSE: 120 mg/dL — ABNORMAL HIGH (ref 70–110)
POTASSIUM: 3.3 mmol/L — ABNORMAL LOW (ref 3.6–5.1)

## 2017-07-25 LAB — CBC WITH DIFF
BASOPHIL #: 0.04 10*3/uL (ref 0.00–0.20)
EOSINOPHIL #: 0.43 10*3/uL (ref 0.00–0.50)
HCT: 38.1 % (ref 35.9–44.6)
HCT: 38.1 % (ref 35.9–44.6)
LYMPHOCYTE #: 1.08 x10ˆ3/uL (ref 0.90–4.00)
MCH: 30.2 pg (ref 27.5–33.2)
MCHC: 33.9 g/dL (ref 31.6–35.5)
MCHC: 33.9 g/dL (ref 31.6–35.5)
MONOCYTE #: 0.28 x10ˆ3/uL (ref 0.00–0.80)
PLATELETS: 203 x10ˆ3/uL (ref 150–450)
RBC: 4.27 x10ˆ6/uL — ABNORMAL LOW (ref 4.40–5.40)
WBC: 5.3 x10ˆ3/uL (ref 4.4–11.0)

## 2017-07-25 LAB — MAGNESIUM: MAGNESIUM: 1.9 mg/dL (ref 1.8–2.5)

## 2017-07-25 MED ORDER — POTASSIUM CHLORIDE ER 10 MEQ CAPSULE,EXTENDED RELEASE
20.0000 meq | ORAL_CAPSULE | Freq: Once | ORAL | Status: AC
Start: 2017-07-25 — End: 2017-07-25
  Administered 2017-07-25: 20 meq via ORAL
  Filled 2017-07-25: qty 2

## 2017-07-25 MED ORDER — DILTIAZEM CD 360 MG CAPSULE,EXTENDED RELEASE 24 HR
360.0000 mg | ORAL_CAPSULE | Freq: Every day | ORAL | 1 refills | Status: DC
Start: 2017-07-26 — End: 2017-09-21

## 2017-07-25 MED ORDER — ALUMINUM-MAG HYDROXIDE-SIMETHICONE 200 MG-200 MG-20 MG/5 ML ORAL SUSP
15.00 mL | Freq: Four times a day (QID) | ORAL | Status: DC | PRN
Start: 2017-07-25 — End: 2017-07-25
  Administered 2017-07-25: 15 mL via ORAL
  Filled 2017-07-25: qty 30

## 2017-07-25 MED ADMIN — nicotine 21 mg/24 hr daily transdermal patch: ORAL | @ 08:00:00

## 2017-07-25 NOTE — Nurses Notes (Signed)
Pt resting comfortably.  Tele continues to show a-fib with HR 80's-130's.  Denies pain or SOB.

## 2017-07-25 NOTE — Nurses Notes (Signed)
Continues to deny pain or SOB.  A-fib with rate still 80's-130's.  Assisted pt to bathroom and pt stated some vertigo when standing. Pt attempted to have a BM, but unable.  Is passing flatus.  Respirations e/u.

## 2017-07-25 NOTE — Nurses Notes (Signed)
Pt ambulated to [place] for void. AFIB on monitor, HR immediately following activity 80-120s

## 2017-07-25 NOTE — Nurses Notes (Signed)
Went over discharge instructions with patient and spouse. Verbalize understanding of information given. Pt getting cleaned up a little at sink before going home. Spouse assisting.

## 2017-07-25 NOTE — Discharge Summary (Signed)
[place] SUMMARY      PATIENT NAME:  Alexandra Nunez, Alexandra Nunez  MRN:  [id number]  DOB:  04-15-1955    ENCOUNTER START DATE:  07/23/2017  INPATIENT ADMISSION DATE: 07/23/17   DISCHARGE DATE:  07/25/2017    ATTENDING PHYSICIAN: Timmie Foerster, DO  SERVICE: [facility]  PRIMARY CARE PHYSICIAN: Gavin Potters, APRN     Reason for Admission     Diagnosis    Atrial fibrillation with rapid ventricular response (CMS [facility]) [[id number]]          DISCHARGE DIAGNOSIS:     Principal Problem:  Atrial fibrillation with rapid ventricular response (CMS [facility])    Active Hospital Problems    Diagnosis Date Noted   . Principle Problem: Atrial fibrillation with rapid ventricular response (CMS HCC) 07/23/2017      Resolved Hospital Problems   No resolved problems to display.     Active Non-Hospital Problems    Diagnosis Date Noted   . CKD (chronic kidney disease) stage 3, GFR 30-59 ml/min (CMS HCC) 07/06/2017   . Atrial fibrillation (CMS HCC) 07/06/2017   . Essential hypertension 07/06/2017   . Osteoarthritis 07/06/2017   . Uterine cancer (CMS [place]) 07/06/2017      Allergies   Allergen Reactions   . Peanut Butter Flavor [Flavoring Agent]    . Prednisone      High doses   . Protonix [Pantoprazole]    . Sulfa (Sulfonamides)             DISCHARGE MEDICATIONS:     Current Discharge Medication List      START taking these medications.      Details   Diltiazem 360 mg Capsule, Sust. Release 24 hr  Commonly known as:  CARDIZEM CD  Start taking on:  07/26/2017  Replaces:  dilTIAZem 120 mg Capsule, Sust. Release 12 hr   360 mg, Oral, DAILY  Qty:  30 Cap  Refills:  1        CONTINUE these medications - NO CHANGES were made during your visit.      Details   albuterol sulfate 90 mcg/actuation HFA Aerosol Inhaler  Commonly known as:  PROVENTIL or VENTOLIN or PROAIR   2 Puffs, Inhalation, EVERY 6 HOURS PRN  Refills:  0     budesonide-formoterol 160-4.5 mcg/actuation HFA Aerosol Inhaler  Commonly known as:  SYMBICORT   2 Puffs, Inhalation, 2 TIMES  DAILY  Refills:  0     buPROPion 300 mg Tablet Sustained Release 24 hr  Commonly known as:  WELLBUTRIN XL   300 mg, Oral, DAILY  Refills:  0     clonazePAM 1 mg Tablet  Commonly known as:  KLONOPIN   1 mg, Oral, 2 TIMES DAILY  Refills:  0     furosemide 40 mg Tablet  Commonly known as:  LASIX   40 mg, Oral, DAILY  Refills:  0     hydrocortisone 2.5 % Cream   Topical, 2 TIMES DAILY  Refills:  0     melatonin 1 mg Tablet   2 mg, Oral, NIGHTLY  Refills:  0     methocarbamol 500 mg Tablet  Commonly known as:  ROBAXIN   500 mg, Oral, 3 TIMES DAILY  Refills:  0     Warfarin 6 mg Tablet  Commonly known as:  COUMADIN   6 mg, Oral, EVERY EVENING, 1 1/2 tablet on Wednesday and Friday and 1 tablet daily every other day.   Qty:  100 Tab  Refills:  3        STOP taking these medications.    acetaminophen 500 mg Tablet  Commonly known as:  TYLENOL     dilTIAZem 120 mg Capsule, Sust. Release 12 hr  Commonly known as:  CARDIZEM SR  Replaced by:  Diltiazem 360 mg Capsule, Sust. Release 24 hr     losartan 100 mg Tablet  Commonly known as:  COZAAR          Discharge med list refreshed?  YES        DISCHARGE INSTRUCTIONS:  Follow-up Information     Leo Grosser, MD On 07/27/2017.    Specialties:  CARDIOLOGY, INTERNAL MEDICINE  Contact information:  [place] [address]  [phone]             [facility]    Specialty:  Emergency Medicine  Why:  As needed, If symptoms worsen  Contact information:  [address]  [place] [address]  [phone]               No discharge procedures on file.       REASON FOR HOSPITALIZATION AND HOSPITAL COURSE:  This is a 62 y.o., female who presented to Dr. Anette Riedel office, her regular cardiologist, for history of a-fib.  While there, her heart rate was in the 150's and she was sent to the ER for evaluation.  She was started on Cardizem gtt and admitted to ICU for evaluation and management.  She was therapeutic on her home coumadin.  She states that she thinks  her a-fib was exacerbated by taking care of her mother more and drinking more coffee.  She was weaned off Cardizem gtt and her home dose was increase from 120 mg once a day to 360 mg once a day and her home losartan was held, advised she could restart it if her BP did remain >140/90.  Her heart rate average this am is in the 90's and she is feeling better and would like to go home.  I called in her Rx to [facility] in [place] and she will start it tomorrow.  She has 120 mg of Cardizem XT that I encouraged her she could take if her pulse remained elevated.  She will f/u with her cardiologist next week.  Discussed treatment plan and voiced understanding.  She is eating well and denies any other acute c/o at present.      CONDITION ON DISCHARGE:  A. Ambulation: Full ambulation  B. Self-care Ability: Complete  C. Cognitive Status Alert and Oriented x 3  D. DNR status at discharge: Full Code    Advance Directive Information      Most Recent Value   Does the Patient have an Advance Directive?  No, Information Offered and Refused          DISCHARGE DISPOSITION:  Home discharge    Timmie Foerster, DO      Copies sent to Care Team       Relationship Specialty Notifications Start End    Gavin Potters, APRN PCP - General NURSE PRACTITIONER  07/06/17     Phone: [phone] Fax: [phone]         [place] [address]          Referring providers can utilize [email or web address] to access their referred [place] patient's information.

## 2017-07-25 NOTE — Nurses Notes (Signed)
Phlebotomy in room to draw AM labs.  Phlebotomy unsuccessful.  RN able to draw blood from IV in [facility]

## 2017-07-25 NOTE — Care Plan (Signed)
AAOx3.  VSS.  Remains in a-fib on monitor with a HR from the 80's-130's.  Denies any pain or SOB.  Lungs clear but diminished.  Sating mid 90's on RA.  Abdomen soft with positive bowel sounds.  Pt passing flatus.  1+ edema BLE.    Problem: Adult Inpatient Plan of Care  Goal: Plan of Care Review  Outcome: Ongoing (see interventions/notes)  Goal: Patient-Specific Goal (Individualization)  Outcome: Ongoing (see interventions/notes)  Goal: Absence of Hospital-Acquired Illness or Injury  Outcome: Ongoing (see interventions/notes)  Goal: Optimal Comfort and Wellbeing  Outcome: Ongoing (see interventions/notes)  Goal: Rounds/Family Conference  Outcome: Ongoing (see interventions/notes)     Problem: Arrhythmia/Dysrhythmia  Goal: Normalized Cardiac Rhythm  Outcome: Ongoing (see interventions/notes)     Problem: Fall Injury Risk  Goal: Absence of Fall and Fall-Related Injury  Outcome: Ongoing (see interventions/notes)

## 2017-07-25 NOTE — Progress Notes (Signed)
[place] -- Progress Note  Name: Alexandra Nunez  Age: 62 y.o.   Gender: female  MRN: [id number]  Date of Admission:  07/23/2017  PCP: Gavin Potters, APRN  Attending: Timmie Foerster, DO  Consultants:       Subjective:  - No new overnight events per staff.  Patient reports no overnight changes.  Review of telemetry shows a-fib with rates from 90-130's at times, higher with activity.  She denies any chest pains or any new issues this am.  She had labs, TSH pending, K+ slightly low, will replace.    Objective:  Filed Vitals:    07/24/17 1950 07/24/17 2355 07/25/17 0250 07/25/17 0255   BP: 115/71  (!) 139/97    Pulse: (!) 104  (!) 123 (!) 114   Resp: 18  15 15    Temp: 36.7 C (98.1 F) 36.3 C (97.3 F) 36.4 C (97.5 F)    SpO2: 95% 94%  96%       I/O last 24 hours:      Intake/Output Summary (Last 24 hours) at 07/25/2017 0718  Last data filed at 07/25/2017 0250  Gross per 24 hour   Intake 1585 ml   Output 1075 ml   Net 510 ml     I/O current shift:  No intake/output data recorded.    Tele-Monitoring: A-fib with rate from 90-120's    Physical Exam:  Today's Physical Exam:  Constitutional:  Lying in bed sleeping, easily awoken, in no acute distress  ENT:  ENMT without erythema or injection, mucous membranes moist, No oral lesions.   Neck:  no thyromegaly or lymphadenopathy and supple, symmetrical, trachea midline  Respiratory:  Clear to auscultation bilaterally.   Cardiovascular:  irregular rate and rhythm, a-fib on monitor with rates 90-120's  Gastrointestinal:  Soft, obese, non-tender, Bowel sounds normal, No masses  Musculoskeletal:  Head atraumatic and normocephalic  Integumentary:  Skin warm and dry and No rashes  Neurologic:  Grossly normal, CN II - XII grossly intact , Alert and oriented x3  Psychiatric:  Normal affect, behavior, memory, thought content, judgement, and speech.    Results for orders placed or performed during the hospital encounter of 07/23/17 (from the past 24 hour(s))   BASIC  METABOLIC PANEL   Result Value Ref Range    SODIUM 142 136 - 144 mmol/L    POTASSIUM 3.3 (L) 3.6 - 5.1 mmol/L    CHLORIDE 111 101 - 111 mmol/L    CO2 TOTAL 23 22 - 32 mmol/L    ANION GAP 8 5 - 15 mmol/L    CALCIUM 8.0 (L) 8.9 - 10.3 mg/dL    GLUCOSE 120 (H) 70 - 110 mg/dL    BUN 13 8 - 26 mg/dL    CREATININE 0.71 0.60 - 1.10 mg/dL    BUN/CREA RATIO 18 6 - 20    ESTIMATED GFR >60 >60 mL/min/1.54m^2   CBC/DIFF    Narrative    The following orders were created for panel order CBC/DIFF.  Procedure                               Abnormality         Status                     ---------                               -----------         ------  CBC WITH ZOXW[[id number]]                Abnormal            Final result                 Please view results for these tests on the individual orders.   MAGNESIUM   Result Value Ref Range    MAGNESIUM 1.9 1.8 - 2.5 mg/dL   CBC WITH DIFF   Result Value Ref Range    WBC 5.3 4.4 - 11.0 x10^3/uL    RBC 4.27 (L) 4.40 - 5.40 x10^6/uL    HGB 12.9 12.0 - 15.3 g/dL    HCT 38.1 35.9 - 44.6 %    MCV 89.1 80.0 - 96.0 fL    MCH 30.2 27.5 - 33.2 pg    MCHC 33.9 31.6 - 35.5 g/dL    RDW 13.7 (H) 10.5 - 13.5 %    PLATELETS 203 150 - 450 x10^3/uL    MPV 8.1 6.7 - 10.4 fL    NEUTROPHIL % 66 %    LYMPHOCYTE % 20 %    MONOCYTE % 5 %    EOSINOPHIL % 8 %    BASOPHIL % 1 %    NEUTROPHIL # 3.51 1.50 - 7.50 x10^3/uL    LYMPHOCYTE # 1.08 0.90 - 4.00 x10^3/uL    MONOCYTE # 0.28 0.00 - 0.80 x10^3/uL    EOSINOPHIL # 0.43 0.00 - 0.50 x10^3/uL    BASOPHIL # 0.04 0.00 - 0.20 x10^3/uL         Assessment and Plan:  Atrial fibrillation with rapid ventricular response (CMS HCC)  Patient Active Problem List   Diagnosis   . CKD (chronic kidney disease) stage 3, GFR 30-59 ml/min (CMS HCC)   . Atrial fibrillation (CMS HCC)   . Essential hypertension   . Osteoarthritis   . Uterine cancer (CMS [place])   . Atrial fibrillation with rapid ventricular response (CMS HCC)     - She will get her PO diltiazem 360 mg  this morning and will monitor her response to this dosage and further recommendations to follow.  Her labs revealed magnesium 1.9, TSH still pending.  CBC stable.  BMP K+ 3.3, will replace.  Continue home Coumadin and monitoring.    Orders Placed This Encounter   . XR CHEST PA AND LATERAL   . BASIC METABOLIC PANEL   . CBC/DIFF   . PT/INR   . PTT (PARTIAL THROMBOPLASTIN TIME)   . TROPONIN-I   . CBC WITH DIFF   . BASIC METABOLIC PANEL   . CBC/DIFF   . MAGNESIUM   . THYROID STIMULATING HORMONE WITH FREE T4 REFLEX   . CBC WITH DIFF   . ECG 12 LEAD   . PATIENT CLASS/LEVEL OF CARE DESIGNATION - [facility]   . NS bolus infusion 1,000 mL   . dilTIAZem (CARDIZEM) 5 mg/mL injection   . digoxin (LANOXIN) 250 mcg/mL injection   . potassium chloride (MICRO K) 10 mEq capsule   . melatonin tablet   . dilTIAZem (CARDIZEM) 5 mg/mL injection ---Cabinet Override   . morphine 2 mg/mL injection   . budesonide-formoterol (SYMBICORT) 160 mcg-4.5 mcg per inhalation oral inhaler - "Nursing to administer"   . warfarin (COUMADIN) tablet   . warfarin (COUMADIN) tablet   . acetaminophen (TYLENOL) tablet   . dilTIAZem (CARDIZEM CD) 24 hr extended release capsule   . dilTIAZem (CARDIZEM CD) 24 hr extended release capsule   .  aluminum-magnesium hydroxide-simethicone (MAG-AL PLUS) 200-200-20 mg per 5 mL oral liquid     Active Orders   Diet    DIET REGULAR     Frequency: All Meals     Number of Occurrences: 1 Occurrences   Nursing    ACTIVITY UP AD LIB     Frequency: UNTIL DISCONTINUED     Number of Occurrences: Until Specified    Notify MD Vital Signs     Frequency: PRN     Number of Occurrences: Until Specified    PT IS LOW RISK FOR VENOUS THROMBOEMBOLISM     Frequency: CONTINUOUS     Number of Occurrences: Until Specified    PULSE OXIMETRY Q4H     Frequency: Q4H     Number of Occurrences: Until Specified    VITAL SIGNS Q4H     Frequency: Q4H     Number of Occurrences: Until Specified   Code Status    FULL CODE     Frequency: CONTINUOUS     Number of  Occurrences: Until Specified   Medications    acetaminophen (TYLENOL) tablet     Frequency: Q6H PRN     Dose: 1,000 mg     Route: Oral    aluminum-magnesium hydroxide-simethicone (MAG-AL PLUS) 200-200-20 mg per 5 mL oral liquid     Frequency: 4x/day PRN     Dose: 15 mL     Route: Oral    budesonide-formoterol (SYMBICORT) 160 mcg-4.5 mcg per inhalation oral inhaler - "Nursing to administer"     Frequency: 2x/day     Dose: 2 Puff     Route: Inhalation     Order Comments:  *Patient Supplied for  doses*     dilTIAZem (CARDIZEM CD) 24 hr extended release capsule     Frequency: Daily     Dose: 360 mg     Route: Oral     Order Comments:  *Patient Supplied for  doses*     melatonin tablet     Frequency: HS PRN     Dose: 2.5 mg     Route: Oral    warfarin (COUMADIN) tablet     Frequency: CUSTOM FREQUENCY     Dose: 9 mg     Route: Oral     Order Comments:  *Patient Supplied for  doses*     warfarin (COUMADIN) tablet     Frequency: CUSTOM FREQUENCY     Dose: 6 mg     Route: Oral     Order Comments:  *Patient Supplied for  doses*          Timmie Foerster, DO

## 2017-07-25 NOTE — Nurses Notes (Signed)
Pt c/o bil knee pain.  Tylenol given per prn order

## 2017-07-25 NOTE — Nurses Notes (Addendum)
Pt discharged via w/c with all belongings.

## 2017-07-25 NOTE — Nurses Notes (Signed)
HR 80s-110s mostly. Occasionally a little higher only briefly, nonsustained. BP stable. Dr. Owens Shark in to see patient to discuss POC

## 2017-07-27 ENCOUNTER — Encounter (INDEPENDENT_AMBULATORY_CARE_PROVIDER_SITE_OTHER): Payer: Self-pay | Admitting: INTERNAL MEDICINE CARDIOVASCULAR DISEASE

## 2017-07-27 ENCOUNTER — Other Ambulatory Visit (INDEPENDENT_AMBULATORY_CARE_PROVIDER_SITE_OTHER): Payer: Self-pay | Admitting: Nurse Practitioner

## 2017-07-27 ENCOUNTER — Ambulatory Visit (HOSPITAL_COMMUNITY): Payer: Medicare HMO | Admitting: Nurse Practitioner

## 2017-07-27 DIAGNOSIS — G8929 Other chronic pain: Principal | ICD-10-CM

## 2017-07-27 DIAGNOSIS — M25561 Pain in right knee: Principal | ICD-10-CM

## 2017-07-27 DIAGNOSIS — M25562 Pain in left knee: Secondary | ICD-10-CM

## 2017-07-28 ENCOUNTER — Ambulatory Visit (INDEPENDENT_AMBULATORY_CARE_PROVIDER_SITE_OTHER): Payer: Medicare PPO | Admitting: INTERNAL MEDICINE CARDIOVASCULAR DISEASE

## 2017-07-28 ENCOUNTER — Encounter (INDEPENDENT_AMBULATORY_CARE_PROVIDER_SITE_OTHER): Payer: Self-pay | Admitting: INTERNAL MEDICINE CARDIOVASCULAR DISEASE

## 2017-07-28 ENCOUNTER — Ambulatory Visit (INDEPENDENT_AMBULATORY_CARE_PROVIDER_SITE_OTHER): Payer: Self-pay | Admitting: Cardiovascular Disease

## 2017-07-28 VITALS — BP 106/58 | HR 83 | Resp 18 | Ht 64.0 in | Wt 335.7 lb

## 2017-07-28 DIAGNOSIS — I4891 Unspecified atrial fibrillation: Secondary | ICD-10-CM

## 2017-07-28 DIAGNOSIS — I10 Essential (primary) hypertension: Secondary | ICD-10-CM

## 2017-07-28 MED ORDER — DILTIAZEM CD 120 MG CAPSULE,EXTENDED RELEASE 24 HR
120.0000 mg | ORAL_CAPSULE | Freq: Every day | ORAL | 4 refills | Status: DC
Start: 2017-07-28 — End: 2017-09-21

## 2017-07-28 NOTE — Progress Notes (Signed)
[place]  [address]  [place] [address]  Phone: [phone]  Fax: [phone]    Encounter Date: 07/28/2017    Patient ID:  Alexandra Nunez  EGB:[id number]    DOB: [birth year]  Age: 62 y.o. female    Subjective:     Chief Complaint   Patient presents with   . Hospital Follow Up   . Arrhythmia   . Shortness of Breath     61 year old lady who initially had paroxysmal AFib lately the AFib has become more permanent.  Patient had an episode of AFib RVR for which she was at the [facility] was up titrated.  Since she has had a medication adjusted she still has episodes with heart rate goes fast and she takes an additional dose of 120 at night.  No history of chest pain, exertional fatigue, dyspnea, orthopnoea, palpitations, syncope, pedal edema or lower extremity pain.            Current Outpatient Medications   Medication Sig   . albuterol sulfate (PROVENTIL OR VENTOLIN OR PROAIR) 90 mcg/actuation Inhalation HFA Aerosol Inhaler Take 2 Puffs by inhalation Every 6 hours as needed   . budesonide-formoterol (SYMBICORT) 160-4.5 mcg/actuation Inhalation HFA Aerosol Inhaler Take 2 Puffs by inhalation Twice daily   . buPROPion (WELLBUTRIN XL) 300 mg extended release 24 hr tablet Take 300 mg by mouth Once a day   . clonazePAM (KLONOPIN) 1 mg Oral Tablet Take 1 mg by mouth Twice daily   . dilTIAZem (CARDIZEM CD) 120 mg Oral Capsule, Sust. Release 24 hr Take 1 Cap (120 mg total) by mouth Once a day   . dilTIAZem (CARDIZEM CD) 360 mg Oral Capsule, Sust. Release 24 hr Take 1 Cap (360 mg total) by mouth Once a day for 30 days   . furosemide (LASIX) 40 mg Oral Tablet Take 40 mg by mouth Once a day   . hydrocortisone 2.5 % Cream Apply topically Twice daily   . melatonin 1 mg Oral Tablet Take 2 mg by mouth Every night   . methocarbamol (ROBAXIN) 500 mg Oral Tablet Take 500 mg by mouth Three times a day   . Warfarin (COUMADIN) 6 mg Oral Tablet Take 1 Tab (6 mg total) by mouth Every  evening for 90 days 1 1/2 tablet on Wednesday and Friday and 1 tablet daily every other day.     Allergies   Allergen Reactions   . Peanut Butter Flavor [Flavoring Agent]    . Prednisone      High doses   . Protonix [Pantoprazole]    . Sulfa (Sulfonamides)      Past Medical History:   Diagnosis Date   . Asthma    . Atrial fibrillation (CMS HCC)    . Chronic joint pain    . CKD (chronic kidney disease) stage 3, GFR 30-59 ml/min (CMS HCC)    . Essential hypertension    . GERD (gastroesophageal reflux disease)    . Obesity    . Osteoarthritis    . Uterine cancer (CMS [facility])          Past Surgical History:   Procedure Laterality Date   . COLONOSCOPY     . HX APPENDECTOMY     . HX DILATION AND CURETTAGE     . LAPAROSCOPIC TOTAL HYSTERECTOMY           Family Medical History:     Problem Relation (Age of Onset)  Coronary Artery Disease Mother, Father    HTN <20 y.o. Mother, Father            Social History     Tobacco Use   . Smoking status: Never Smoker   . Smokeless tobacco: Never Used   Substance Use Topics   . Alcohol use: Never     Frequency: Never   . Drug use: Not on file       Review of Systems   HENT: Negative for nosebleeds.    Eyes: Negative for visual disturbance.   Respiratory: Negative for cough, chest tightness and shortness of breath.    Cardiovascular: Positive for palpitations and leg swelling. Negative for chest pain.   Neurological: Positive for dizziness and light-headedness. Negative for syncope and speech difficulty.   Psychiatric/Behavioral: Negative for confusion.   All other systems reviewed and are negative.    Objective:   Vitals: BP (!) 106/58   Pulse 83   Resp 18   Ht 1.626 m (5\' 4" )   Wt (!) 152.3 kg (335 lb 11.2 oz)   SpO2 93%   BMI 57.62 kg/m         Physical Exam   Constitutional: She is oriented to person, place, and time. She appears well-developed and well-nourished.   HENT:   Head: Normocephalic.   Eyes: Pupils are equal, round, and reactive to light.   Neck: Normal range of  motion. No JVD present.   Cardiovascular: Normal rate, regular rhythm and normal heart sounds.   No murmur heard.  Pulmonary/Chest: Effort normal and breath sounds normal.   Abdominal: Soft. Bowel sounds are normal.   Musculoskeletal: Normal range of motion. She exhibits no edema or deformity.   Neurological: She is alert and oriented to person, place, and time. No cranial nerve deficit.   Skin: Skin is warm and dry.   Nursing note and vitals reviewed.      Assessment & Plan:     ENCOUNTER DIAGNOSES     ICD-10-CM   1. Atrial fibrillation (CMS HCC) I48.91   2. Essential hypertension I10     Paroxysmal atrial fibrillation now and permanent AFib on rate control and stroke prevention  Will obtain event monitor to see whether not she is in paroxysmal a permanent AFib whether she has tachy-brady syndrome minute.  Give patient information on TEE cardioversion she verbalized understanding she looks information over and decide whether she wants to proceed with this or not.    Orders Placed This Encounter   . [place] TEE W CARDIOVERSION-ADULT   . dilTIAZem (CARDIZEM CD) 120 mg Oral Capsule, Sust. Release 24 hr     This note may have been partially generated using MModal Fluency Direct system, and there may be some incorrect words, spellings, and punctuation that were not noted in checking the note before saving.    Return in about 1 month (around 08/25/2017).    Leo Grosser, MD

## 2017-07-28 NOTE — Nursing Note (Signed)
Pt did not have a med list with them at visit - went over list with patient   Advised patient to check AVS list with medicines at home and call if any discrepancies    -Confirmed correct pharmacy with patient for e-scribing     [place], [place]  07/28/2017, 14:14

## 2017-07-28 NOTE — Patient Instructions (Addendum)
Understanding Atrial Fibrillation    An arrhythmia is any problem with the speed or pattern of the heartbeat. Atrial fibrillation (AFib) is a common type of arrhythmia. It causes fast, chaotic electrical signals in the atria. This leads to poor functioning of the heart. It also affects how much blood your heart can pump out to the body.  Afib may occur once in a while and go away on its own. Or it may continue for longer periods and need treatment.  AFib can lead to serious problems, such as stroke. Your healthcare provider will need to monitor and manage it.  What happens during atrial fibrillation?  The heart has an electrical system that sends signals to control the heartbeat. As signals move through the heart, they tell the heart's upper chambers (atria) and lower chambers (ventricles) when to squeeze (contract) and relax. This lets blood move through the heart and out to the body and lungs.  With AFib, the atria receive abnormal signals. This causes them to contract in a fast and irregular way, and out of sync with the ventricles. When this happens, the atria also have a harder time moving blood into the ventricles. Blood may then pool in the atria, which increases the risk for blood clots and stroke. The ventricles also may contract too quickly and irregularly. As a result, they may not pump blood to the body and lungs as well as they should. This can weaken the heart muscle over time and cause heart failure.  What causes atrial fibrillation?  AFib is more common in older adults. It has many possible causes including:   Coronary artery disease   Heart valve disease   Heart attack   Heart surgery   High blood pressure   Thyroid disease   Diabetes   Lung disease   Sleep apnea   Heavy alcohol use  In some cases of AFib, doctors do not know the cause.  What are the symptoms of atrial fibrillation?  AFib may or may not cause symptoms. If symptoms do occur, they may include:   A fast, pounding,  irregular heartbeat   Shortness of breath   Tiredness   Dizziness or fainting   Chest pain  How is atrial fibrillation treated?  Treatments for AFib can include any of the options below.   Medicines. You may be prescribed:  ? Heart rate medicines to help slow down the heartbeat  ? Heart rhythm medicines to help the heart beat more regularly  ? Anti-clotting medicines to help reduce the risk for blood clots and stroke   Electrical cardioversion. Your healthcare provider uses special pads or paddles to send one or more brief electrical shocks to the heart. This can help reset the heartbeat to normal.   Ablation. Long, thin tubes called catheters are threaded through a blood vessel to the heart. There, the catheters send out hot or cold energy to the areas causing the abnormal signals. This energy destroys the problem tissue or cells. This improves the chances that your heart will stay in normal rhythm without using medicines. If your heart rate and rhythm can't be controlled, you may need ablation and a pacemaker. These will help control the heart rate and regularity of the heartbeat.   Surgery. During surgery, your healthcare provider may use different methods to create scar tissue in the areas of the heart causing the abnormal signals. The scar tissue disrupts the abnormal signals and may stop AFib from occurring.  What are the complications of atrial   fibrillation?  These can include:   Blood clots   Stroke   Heart failure. This problem occurs when the heart muscle weakens so much that it can no longer pump blood well.  When should I call my healthcare provider?  Call your healthcare provider right away if you have any of these:   Symptoms that don't get better with treatment, or get worse   New symptoms   Date Last Reviewed: 08/06/2014   2000-2018 [employer]. [address] [place] [address]. All rights reserved. This information is not intended as a substitute for professional  medical care. Always follow your healthcare professional's instructions.        Transesophageal Echocardiography (TEE)    Transesophageal echocardiography (TEE) is a test done to record images of your heart with a probe inside your esophagus. These images help your healthcare provider find and treat problems such as infection, disease, or defects in your heart's function, walls or valves. This test may be done when a chest echocardiogram (transthoracic) does not give your provider enough information.  Before your test   Tell your provider about allthe medicines you take. Ask if it'sOK to take them before the test.   Don't eat or drink for6 to8hours before the test. This includes water.   Tell your healthcare provider if you have ulcers, a hiatal hernia, or problems swallowing. Also report a history of narrowing of the esophagus, or any other previous gastrointestinal problems. Also, let him or her know of any allergies to medicines or sedatives.   Also let your provider know if you have dental implants or dentures that should be removed before the test.   Arrange to have someonedrive youhome after the exam.  During your TEE   When you arrive for your TEE, you will change into a hospital gown, and then be taken to the testing room.   Your provider will spray your throat with a numbing medicine. You may be given a medicine through an IV (intravenous) in your arm to help you relax. You may also be given oxygen. Then you'll be asked to lie on your left side.   The healthcare provider gently inserts the small, lubricated probe into your mouth. As you swallow, he or she will slowly guide the tube into your esophagus.   You may feel the healthcare provider moving the probe, but it shouldn't hurt or interfere with your breathing. A nurse checks your heart rate, blood pressure, and breathing.The test usually takes 20 to 40 minutes.   The nurse or assistant will suction any saliva out of your mouth,  similar to when you have a dental cleaning.  After the test   Tell your healthcare provider about any pain, or if you cough up or vomit blood, or have trouble swallowing.   You can eatand drink again when your throat is no longer numb.   Do not drive a car or run heavy machinery for at least 24 hours after getting sedation. After 24 hours you can return to normal activity unless your healthcare provider tells you otherwise.   Be sure to keep your follow-up appointment to go over the results with your healthcare provider.   Your next appointment is: ____________________  Date Last Reviewed: 03/08/2015   2000-2018 [employer]. [address] [place] [address]. All rights reserved. This information is not intended as a substitute for professional medical care. Always follow your healthcare professional's instructions.  Electrical Cardioversion     Cut away image of heart showing SA node, right atrium, AV node, and left atrium   You had a cardioversion today. This is an electrical shock applied to the chest. The shockreset your heart rhythmback to normal. Your chest wall and chest muscles may feel sore for a few days. Some redness may appear on the skin on your chest where the cardioversion patches were applied. That will go away within a week.  To get ready for this procedure, you may have been given medicine to help you relax and to reduce pain. Depending on the medicine used,it could take up to8 hours for the effect to wear off.  Home care  Follow these guidelines when caring for yourself at home:   You should be watched by a responsible adult for the next 8 hours. This is in case your condition gets worse.   Don't take any oral medicine for pain or sleep during the next 4 hours. These medicines might react with the medicines you were given in the hospital. This can cause a much stronger response than usual.   Don't drinkanyalcohol for the next 24 hours.   Don'tdriveor  operate dangerous machinery during the next 24 hours.   Your healthcare provider will have prescribed medicines to stop the abnormal heart rhythm from coming back. Take these medicines as directed. Expect to take blood thinners for at least 4 weeks. This course of blood thinners should not be interrupted. Do not schedule other invasive procedures during this time. Interrupting this medicine could increase your risk for a stroke after a cardioversion.   You may use acetaminophen or ibuprofen to control pain, unless another pain medicine was prescribed. If you have chronic liver or kidney disease, talk with your provider before taking these medicines. Also talk with your provider if you've had a stomach ulcer or gastrointestinal bleeding.  Follow-up care  Follow up with your healthcare provider, or as advised, if you aren't alert and back to your usual level of activity within 12 hours.  Call 911  Call 911 if you have:   Pain in your chest, arm, shoulder, neck or upper back   You have problems speaking or seeing   Weakness in an arm or leg   You are unable to move your arm or leg on one side of your body   You have uncrontrolled bleeding from blood thinning medicines  When to seek medical advice  Call your healthcare provider right away if any of these occur:   Weakness, dizziness, lightheadedness, or fainting   Shortness of breath   You feel like your heart is fluttering or beating fast, hard, or irregularly (palpitations)   More than minor skin discomfort or redness where the cardioversion pads were placed  Date Last Reviewed: 04/08/2015   2000-2018 [employer]. [address] [place] [address]. All rights reserved. This information is not intended as a substitute for professional medical care. Always follow your healthcare professional's instructions.        What Is Event Monitoring?    Event monitoring is a painless way to record your heartbeat. You can do this at home or  another placeaway fromyour healthcare provider's office. The monitoris a smallelectrocardiogram (ECG) that you carry with you. It runs on batteries. Event monitoring records your heart rhythm for your healthcare provider to review at a later time. The event monitor lets you record yourheart rhythm as you feelsymptoms.Some event monitors can detect and  record irregular heart rhythms in real time, with or without symptoms. Wireless monitors allow data to be sentto your provider's office for evaluation right away after an event is recorded.You may carry this monitor for days or weeks. You can get your heart monitor in a hospital, test center, or healthcare provider's office.  When usingan eventmonitor  Stay away from electric blankets, magnets, metal detectors, and high-voltage areas such as power lines. They may affect the recording.   Your event monitor  Your healthcare provider will show you how to use yourevent monitor. Wear it as instructed all the time. When you feel a symptom, turn on the monitor by pressing a button. Your provider will tell you how to do this. Be sure to keep a diary. Write down symptoms in detail. Include the time and date. Depending on the type of monitor, you may need to change the batteries every 1 to 2 days.     An ECG recording of an irregular heartbeat during an event.      An ECG recording of a regular heartbeat.     Event monitor diary   Write in the time of day for each entry you make.   Note each change in activity. This includes when you take medicine.   Note any symptoms you feel. Also write down what was happening around you when the symptoms occurred.    Date Last Reviewed: 06/06/2015   2000-2018 [employer]. [address], [place] [address]. All rights reserved. This information is not intended as a substitute for professional medical care. Always follow your healthcare professional's instructions.

## 2017-07-30 ENCOUNTER — Ambulatory Visit (INDEPENDENT_AMBULATORY_CARE_PROVIDER_SITE_OTHER): Payer: Self-pay | Admitting: INTERNAL MEDICINE CARDIOVASCULAR DISEASE

## 2017-07-31 ENCOUNTER — Telehealth: Payer: Self-pay | Admitting: Internal Medicine

## 2017-07-31 MED ORDER — BUDESONIDE-FORMOTEROL FUMARATE 160-4.5 MCG/ACT IN AERO: 2.0000 | INHALATION_SPRAY | Freq: Two times a day (BID) | RESPIRATORY_TRACT | 0 refills | Status: DC

## 2017-07-31 NOTE — Telephone Encounter (Signed)
Pt requesting symbicort samples.  These have been placed up front for pickup.  Nothing further needed 

## 2017-08-04 ENCOUNTER — Encounter (INDEPENDENT_AMBULATORY_CARE_PROVIDER_SITE_OTHER): Payer: Self-pay | Admitting: INTERNAL MEDICINE CARDIOVASCULAR DISEASE

## 2017-08-04 NOTE — Nursing Note (Signed)
Spoke with patient and gave following information:    Patient scheduled for TEE on 10/01/17 at 12:00 pm is to arrive at [facility] at 10:00 (2 hrs before procedure)      Patient is to report to [facility] for pre admissions on 09/21/17 at 11:00    Patient to have non-fasting labs:  BMP, CBC/diff, PT/INR (within 30 days of procedure.      Contrast Allergy:  No    Pre-procedure/operative Instructions:   1. Attire:  Please dress in loose comfortable clothing   2. Diet Instructions:  Please do not eat or drink after midnight   3 Medication Instructions:     - You do not need to hold any medications in preparation for this test    - Please CONTINUE - to take Aspirin, Effient, Brilinta and Plavix, if you                           are currently on them.    - Please DO NOT TAKE ANY MEDICATIONS the day of the procedure   4. Transportation: You will need someone to drive you home    Patient verbalized understanding, no questions voiced.    If you have ay questions regarding pre-operative instructions, please call:  [phone]

## 2017-08-05 ENCOUNTER — Ambulatory Visit: Payer: Medicare PPO | Attending: Nurse Practitioner

## 2017-08-05 ENCOUNTER — Other Ambulatory Visit: Payer: Self-pay

## 2017-08-05 ENCOUNTER — Ambulatory Visit (INDEPENDENT_AMBULATORY_CARE_PROVIDER_SITE_OTHER): Payer: Self-pay | Admitting: Nurse Practitioner

## 2017-08-05 DIAGNOSIS — I4891 Unspecified atrial fibrillation: Secondary | ICD-10-CM | POA: Insufficient documentation

## 2017-08-05 LAB — PT/INR
INR: 2.5 — ABNORMAL HIGH (ref 0.92–1.08)
PROTHROMBIN TIME: 27 seconds — ABNORMAL HIGH (ref 12.5–14.1)

## 2017-08-07 ENCOUNTER — Encounter (HOSPITAL_COMMUNITY): Payer: Self-pay

## 2017-08-20 ENCOUNTER — Ambulatory Visit (HOSPITAL_COMMUNITY)
Admission: RE | Admit: 2017-08-20 | Discharge: 2017-08-20 | Disposition: A | Discharge status: Home / Self Care | Payer: Medicare PPO | Source: Ambulatory Visit

## 2017-08-20 ENCOUNTER — Ambulatory Visit (HOSPITAL_COMMUNITY): Payer: Medicare PPO

## 2017-08-20 ENCOUNTER — Ambulatory Visit
Admission: RE | Admit: 2017-08-20 | Discharge: 2017-08-20 | Disposition: A | Discharge status: Home / Self Care | Payer: Medicare PPO | Source: Ambulatory Visit | Attending: INTERNAL MEDICINE CARDIOVASCULAR DISEASE | Admitting: INTERNAL MEDICINE CARDIOVASCULAR DISEASE

## 2017-08-20 DIAGNOSIS — R531 Weakness: Secondary | ICD-10-CM | POA: Insufficient documentation

## 2017-08-20 DIAGNOSIS — M25561 Pain in right knee: Secondary | ICD-10-CM | POA: Insufficient documentation

## 2017-08-20 DIAGNOSIS — G8929 Other chronic pain: Secondary | ICD-10-CM | POA: Insufficient documentation

## 2017-08-20 DIAGNOSIS — R42 Dizziness and giddiness: Secondary | ICD-10-CM | POA: Insufficient documentation

## 2017-08-20 DIAGNOSIS — I10 Essential (primary) hypertension: Secondary | ICD-10-CM | POA: Insufficient documentation

## 2017-08-20 DIAGNOSIS — I4891 Unspecified atrial fibrillation: Secondary | ICD-10-CM | POA: Insufficient documentation

## 2017-08-20 DIAGNOSIS — M25562 Pain in left knee: Secondary | ICD-10-CM | POA: Insufficient documentation

## 2017-08-20 MED ORDER — PERFLUTREN LIPID MICROSPHERES 1.1 MG/ML INTRAVENOUS SUSPENSION
INTRAVENOUS | Status: DC
Start: 2017-08-20 — End: 2017-08-21
  Filled 2017-08-20: qty 2

## 2017-08-20 MED ORDER — SODIUM CHLORIDE 0.9 % INJECTION SOLUTION
2.00 mL | Freq: Once | INTRAMUSCULAR | Status: DC | PRN
Start: 2017-08-20 — End: 2017-08-21
  Administered 2017-08-20: 2 mL via INTRAVENOUS

## 2017-08-21 LAB — TRANSTHORACIC ECHOCARDIOGRAM - ADULT
AV LVOT peak gradient: 6 mmHg
AV mean gradient: 7 mmHg
Ao VTI: 51.3 cm
Ao peak vel: 195 cm/s
Aortic Valve Area by Continuity of Peak Velocity: 2.24 cm2
Aortic Valve Area by Continuity of VTI: 2.16 cm2
Ascending aorta: 3.9 cm
E wave decelartion time: 183 ms
E/A ratio: 1.2
E/E' ratio: 9.6
Inferior Vena Cava Diameter: 1.75 cm
Inferior Vena Cava Diameter: 1.75 cm
LA Volume Index: 34 ml/m2
LA size: 5 cm
LA size: 5 cm
LA volume: 88.6 cm3
LVOT diameter: 2.1 cm
LVOT stroke volume: 111 cm3
Left Ventricular Outflow Tract Peak Velocity: 126 cm/s
MV Peak A Vel: 103 cm/s
MV Peak E Vel: 122 cm/s
MV stenosis pressure 1/2 time: 54 ms
PV peak gradient: 4 mmHg
Pulmonic Valve Max Velocity: 97.2 cm/s
RVDD: 3.73 cm
RVDD: 3.73 cm
TDI: 12.7 cm/s
TDI: 12.7 cm/s

## 2017-08-24 ENCOUNTER — Other Ambulatory Visit (INDEPENDENT_AMBULATORY_CARE_PROVIDER_SITE_OTHER): Payer: Self-pay | Admitting: Nurse Practitioner

## 2017-08-24 ENCOUNTER — Ambulatory Visit (INDEPENDENT_AMBULATORY_CARE_PROVIDER_SITE_OTHER): Payer: Self-pay | Admitting: Nurse Practitioner

## 2017-08-24 DIAGNOSIS — M25561 Pain in right knee: Principal | ICD-10-CM

## 2017-08-24 DIAGNOSIS — M25562 Pain in left knee: Secondary | ICD-10-CM

## 2017-08-24 NOTE — Telephone Encounter (Signed)
Patient notified per Arlis Porta, APRN that she did not wish to change her Diltiazem at this time. Patient states she will call back after going to pain clinic and let us know what meds they put her on. Is scared that they might drop her pulse too much.

## 2017-08-24 NOTE — Telephone Encounter (Signed)
-----   Message from Gavin Potters, APRN sent at 08/24/2017 10:46 AM EDT -----  ----- Message from [place] sent at 08/24/2017  9:14 AM EDT -----  Having low pulse  Around 50, should the dosage of  Diltiazem ER 360  1 daily be decreased?  She was having A Fib  [facility] in [place]

## 2017-08-26 ENCOUNTER — Ambulatory Visit (INDEPENDENT_AMBULATORY_CARE_PROVIDER_SITE_OTHER): Payer: Self-pay | Admitting: INTERNAL MEDICINE CARDIOVASCULAR DISEASE

## 2017-08-26 ENCOUNTER — Encounter (INDEPENDENT_AMBULATORY_CARE_PROVIDER_SITE_OTHER): Payer: Self-pay | Admitting: Physician Assistant

## 2017-08-26 NOTE — Telephone Encounter (Signed)
-----   Message from Georgina Snell sent at 08/25/2017  4:01 PM EDT -----  Pt called, She has a follow up with Dr.Ali in July. She has not picked up the 30 day event monitor due to she had an [place] and it showed she is no longer in Afib. She wanted to know if Dr.Ali still wants her to wear 30 day monitor? Thanks!

## 2017-08-26 NOTE — Telephone Encounter (Signed)
Phoned patient to let her know that Dr. Deatra Canter would still like her to pick up monitor. Patient verbalized understanding and does not have any questions or concerns at this time.    [place], [place]  08/26/2017, 08:28

## 2017-09-07 ENCOUNTER — Ambulatory Visit (INDEPENDENT_AMBULATORY_CARE_PROVIDER_SITE_OTHER): Payer: Self-pay | Admitting: Medical

## 2017-09-07 ENCOUNTER — Ambulatory Visit (INDEPENDENT_AMBULATORY_CARE_PROVIDER_SITE_OTHER): Payer: Self-pay | Admitting: PHYSICIAN ASSISTANT

## 2017-09-07 NOTE — Telephone Encounter (Signed)
Urgent report from [facility] at [facility] patient has new onset Afib/flutter with 130 beats per minute. Report was obtained and forwarded to [facility].    [place], [place]  09/07/2017, 11:42

## 2017-09-09 ENCOUNTER — Encounter: Payer: Self-pay | Admitting: Internal Medicine

## 2017-09-10 ENCOUNTER — Ambulatory Visit (INDEPENDENT_AMBULATORY_CARE_PROVIDER_SITE_OTHER): Payer: Self-pay | Admitting: INTERNAL MEDICINE CARDIOVASCULAR DISEASE

## 2017-09-10 ENCOUNTER — Encounter: Payer: Self-pay | Admitting: Internal Medicine

## 2017-09-10 ENCOUNTER — Other Ambulatory Visit: Payer: Self-pay

## 2017-09-10 MED ORDER — BUDESONIDE-FORMOTEROL FUMARATE 160-4.5 MCG/ACT IN AERO: 2.0000 | INHALATION_SPRAY | Freq: Two times a day (BID) | RESPIRATORY_TRACT | 0 refills | Status: DC

## 2017-09-10 NOTE — Telephone Encounter (Signed)
Patient phoned into the office stating that she is having rapid heart rate, fatigue and SOB. She was asking if she could get into see a provider today. I advised patient to go nearest ER to be evaluated. Patient verbalized understanding and does not have any questions or concerns at this time.    [place], [place]  09/10/2017, 16:18

## 2017-09-10 NOTE — Telephone Encounter (Signed)
Pt is requesting samples of symbicort 160. Per last OV note from 04/22/17- pt was given dulera to take in place of Symbicort. I do not see documentation of dulera being d/c and symbicort being restarted.  Pt states that dulera did not work well for her, and that Symbicort 160 works best.  MW please advise if okay to give samples of symbicort 160

## 2017-09-10 NOTE — Telephone Encounter (Signed)
This pt has not been seen since jan 2019 and if wants to continue under out care needs to get in asap with all active meds in hand and we can sort out the  issues like doing the [place] and [place] paperwork so make the appt, then give enough symb 160 samples until ov with  Tammy NP or me

## 2017-09-11 ENCOUNTER — Other Ambulatory Visit (INDEPENDENT_AMBULATORY_CARE_PROVIDER_SITE_OTHER): Payer: Self-pay | Admitting: Physician Assistant

## 2017-09-11 DIAGNOSIS — M25562 Pain in left knee: Principal | ICD-10-CM

## 2017-09-11 DIAGNOSIS — M25561 Pain in right knee: Secondary | ICD-10-CM

## 2017-09-14 ENCOUNTER — Ambulatory Visit (INDEPENDENT_AMBULATORY_CARE_PROVIDER_SITE_OTHER): Payer: Self-pay | Admitting: Medical

## 2017-09-14 ENCOUNTER — Encounter: Payer: Self-pay | Admitting: Internal Medicine

## 2017-09-14 NOTE — Telephone Encounter (Signed)
Called patient regarding the [facility] report we received, today. Per A. O'kernick, Known afib call to see how she is feeling.     Patient reports Shortness of breath, fatigue, heart racing, and reported being in the ER. Those records will be viewed and given to A. O'kernick. Patient is scheduled for an appointment to discuss this with [name] on 09/17/17 at 11:30 AM.  Patient confirmed this appointment, and voiced understanding of the urgent report.    Youlanda Roys, LPN  [id number]/[id number], 19:37

## 2017-09-15 ENCOUNTER — Ambulatory Visit (INDEPENDENT_AMBULATORY_CARE_PROVIDER_SITE_OTHER): Payer: Medicare PPO | Admitting: Physician Assistant

## 2017-09-15 ENCOUNTER — Encounter (INDEPENDENT_AMBULATORY_CARE_PROVIDER_SITE_OTHER): Payer: Self-pay | Admitting: Physician Assistant

## 2017-09-15 ENCOUNTER — Ambulatory Visit: Payer: Medicare PPO | Attending: Nurse Practitioner

## 2017-09-15 VITALS — Ht 64.0 in | Wt 335.8 lb

## 2017-09-15 DIAGNOSIS — M25561 Pain in right knee: Secondary | ICD-10-CM

## 2017-09-15 DIAGNOSIS — I4891 Unspecified atrial fibrillation: Secondary | ICD-10-CM

## 2017-09-15 DIAGNOSIS — Z7901 Long term (current) use of anticoagulants: Secondary | ICD-10-CM

## 2017-09-15 DIAGNOSIS — Z6841 Body Mass Index (BMI) 40.0 and over, adult: Secondary | ICD-10-CM

## 2017-09-15 DIAGNOSIS — M170 Bilateral primary osteoarthritis of knee: Secondary | ICD-10-CM

## 2017-09-15 DIAGNOSIS — M25562 Pain in left knee: Secondary | ICD-10-CM

## 2017-09-15 LAB — PT/INR
INR: 2.17 — ABNORMAL HIGH (ref 0.92–1.08)
PROTHROMBIN TIME: 24.2 seconds — ABNORMAL HIGH (ref 12.5–14.1)

## 2017-09-15 NOTE — Progress Notes (Signed)
[facility]  [place] [address]  Dept: [phone]  Dept Fax: [phone]  Loc: [phone]        NEW PATIENT OFFICE VISIT      PATIENT NAME:  Alexandra Nunez  MEDICAL RECORD NUMBER: [id number]    DICTATING PHYSICIAN:  Lenore Cordia, PA-C  REFERRING PHYSICIAN:  Gavin Potters, APRN    DOB:  July 25, 1955  DOS:  09/15/2017      CHIEF COMPLAINT:   Chief Complaint   Patient presents with   . New Patient   . Knee Pain     bilateral knee constant sharp burning pain 6-10/10        HISTORY OF PRESENT ILLNESS:    Alexandra Nunez is a 62 y.o. female who presents to the office today for initial consultation in regards to her bilateral knee advanced osteoarthritis.  She has had trouble with her knees for several years.  She just recently moved to [place] from [place].  She has undergone several injections while living in [place].  She states that her initial series of viscous supplement helped but that her last series did not help at all.  She also states that she has had several cortisone injections with her last one being in either October or November of 2018. She did not achieve any relief following that injection as well.  Cortisone injections also cause her blood pressure to elevate.  She is mostly confined to a wheelchair secondary to her knee pain.  She is able to walk from her living room to her kitchen but then has to sit down.  She has to cook sitting on a stool.  She is unable to stand for any length at all.  She avoids activity that she used to enjoy a secondary to her knee pain.  She has been told that she is on the verge of becoming a diabetic but is not currently medicated.  She notices sensations of rubbing and grinding in her knees.  She also states that she has lost motion.  She did undergo evaluation for possible gastric bypass while living in [place].  She went through all of the counseling and  preoperative criteria but then had to cancel her surgery because she lost her insurance.  She has not seen anyone [place] to pursue bariatric surgery.  She also states that she thought today's appointment was for pain management and did not realize she was seeing Orthopedics.  She does utilize [employer] and Motrin.  She has taken hydrocodone in the past which seems to help.  She is not able to take ibuprofen or other NSAIDs because she is on Coumadin for atrial fibrillation.  She has not noticed any pain in the hips or groin.  She does admit to chronic low back pain.        PAST MEDICAL HISTORY:  Past Medical History:   Diagnosis Date   . Asthma    . Atrial fibrillation (CMS HCC)    . Borderline diabetes    . Chronic joint pain    . CKD (chronic kidney disease) stage 3, GFR 30-59 ml/min (CMS HCC)    . Essential hypertension    . GERD (gastroesophageal reflux disease)    . Knee pain, bilateral    . Obesity    . Osteoarthritis    . Uterine cancer (CMS [place])  PAST SURGICAL HISTORY:  Past Surgical History:   Procedure Laterality Date   . COLONOSCOPY     . HX APPENDECTOMY     . HX DILATION AND CURETTAGE     . LAPAROSCOPIC TOTAL HYSTERECTOMY             FAMILY HISTORY:  Family Medical History:     Problem Relation (Age of Onset)    Coronary Artery Disease Mother, Father    HTN <20 y.o. Mother, Father              SOCIAL HISTORY:  Social History     Socioeconomic History   . Marital status: Married     Spouse name: Not on file   . Number of children: Not on file   . Years of education: Not on file   . Highest education level: Not on file   Occupational History   . Not on file   Social Needs   . Financial resource strain: Not on file   . Food insecurity:     Worry: Not on file     Inability: Not on file   . Transportation needs:     Medical: Not on file     Non-medical: Not on file   Tobacco Use   . Smoking status: Never Smoker   . Smokeless tobacco: Never Used   Substance and Sexual Activity   . Alcohol use:  Never     Frequency: Never   . Drug use: Never   . Sexual activity: Not on file   Lifestyle   . Physical activity:     Days per week: Not on file     Minutes per session: Not on file   . Stress: Not on file   Relationships   . Social connections:     Talks on phone: Not on file     Gets together: Not on file     Attends religious service: Not on file     Active member of club or organization: Not on file     Attends meetings of clubs or organizations: Not on file     Relationship status: Not on file   . Intimate partner violence:     Fear of current or ex partner: Not on file     Emotionally abused: Not on file     Physically abused: Not on file     Forced sexual activity: Not on file   Other Topics Concern   . Not on file   Social History Narrative   . Not on file       ALLERGIES:  Allergies   Allergen Reactions   . Peanut Butter Flavor [Flavoring Agent]    . Prednisone      High doses   . Protonix [Pantoprazole]    . Sulfa (Sulfonamides)        MEDICATIONS:  Current Outpatient Medications   Medication Sig Dispense Refill   . acetaminophen (TYLENOL 8 HOUR) 650 mg Oral Tablet Sustained Release Take 1,300 mg by mouth Twice daily     . albuterol sulfate (PROVENTIL OR VENTOLIN OR PROAIR) 90 mcg/actuation Inhalation HFA Aerosol Inhaler Take 2 Puffs by inhalation Every 6 hours as needed     . budesonide-formoterol (SYMBICORT) 160-4.5 mcg/actuation Inhalation HFA Aerosol Inhaler Take 2 Puffs by inhalation Twice daily     . buPROPion (WELLBUTRIN XL) 300 mg extended release 24 hr tablet Take 300 mg by mouth Once a day     . clonazePAM (  KLONOPIN) 1 mg Oral Tablet Take 1 mg by mouth Twice daily     . dilTIAZem (CARDIZEM CD) 120 mg Oral Capsule, Sust. Release 24 hr Take 1 Cap (120 mg total) by mouth Once a day 90 Cap 4   . dilTIAZem (CARDIZEM CD) 360 mg Oral Capsule, Sust. Release 24 hr Take 1 Cap (360 mg total) by mouth Once a day for 30 days 30 Cap 1   . furosemide (LASIX) 40 mg Oral Tablet Take 40 mg by mouth Once a day        . hydrocortisone 2.5 % Cream Apply topically Twice daily     . melatonin 1 mg Oral Tablet Take 2 mg by mouth Every night     . methocarbamol (ROBAXIN) 500 mg Oral Tablet Take 500 mg by mouth Three times a day     . Warfarin (COUMADIN) 6 mg Oral Tablet Take 1 Tab (6 mg total) by mouth Every evening for 90 days 1 1/2 tablet on Wednesday and Friday and 1 tablet daily every other day. 100 Tab 3     No current facility-administered medications for this visit.          REVIEW OF SYMPTOMS:  Patient denies fever, chills, chest pain or shortness of breath. All other review of systems reviewed and were negative.        PHYSICAL EXAM:    Vitals:    09/15/17 0859   Weight: (!) 152.3 kg (335 lb 12.2 oz)   Height: 1.626 m (_0 )   PainSc:   6   PainLoc: Knee    Body mass index is 57.63 kg/m.  Consistent with morbid obesity    General: Healthy and cooperative,  no acute distress, accompanied by her husband  Psychiatric: Normal mood and affect, Alert, oriented to time, person, place   Lungs:  Respirations symmetric and non-labored  Cardiac: Regular rate and rhythm     Skin:   No scars, lesions or rashes or erythema  Lymphatic: No lymphadenopathy  Neurologic: Dermatomal sensation intact    Musculoskeletal:    Patient is sitting in a wheelchair.  She is unable to get onto the examination table.  She does have +2 pitting edema of both lower extremities.  Right knee:  Range of motion 15-95, tender along the medial lateral joint spaces, crepitus with range of motion, no obvious AP instability.  Left knee:  Range of motion 10-80, tender along the medial lateral joint spaces, crepitus with range of motion, no obvious AP instability.    IMAGING:    Charda Nyce    PROCEDURE DESCRIPTION: XR KNEE LEFT 4 OR MORE VIEWS    PROCEDURE PERFORMED DATE AND TIME: 08/20/2017 5:10 PM    CLINICAL INDICATION: M25.561: Bilateral chronic knee pain  M25.562: Bilateral chronic knee pain  G89.29: Bilateral chronic knee pain    There is severe  narrowing of joint space with very proximal sclerosis and  spurring from degenerative change. Osteopenia is present. There is a large  calcification in the suprapatellar region measuring 3.5 x 1.7 cm.    IMPRESSION:  Severe degenerative changes.      Jordynn Eddings    PROCEDURE DESCRIPTION: XR KNEE RIGHT 4 OR MORE VIEWS    PROCEDURE PERFORMED DATE AND TIME: 08/20/2017 5:10 PM    CLINICAL INDICATION: M25.561: Bilateral chronic knee pain  M25.562: Bilateral chronic knee pain  G89.29: Bilateral chronic knee pain    Diffuse osteopenia is noted. There is severe narrowing of joint space.  There is abundant sclerosis and spurring from degenerative change. No  fracture or effusion is seen.    IMPRESSION:  Severe degenerative changes.    ASSESSMENT:    1. Morbid obesity with BMI of 50.0-59.9, adult (CMS HCC)    2. Bilateral primary osteoarthritis of knee    3. Bilateral knee pain         PLAN:      Had a lengthy discussion with the patient and her husband regarding her bilateral knee advanced osteoarthritis.  She would benefit from a knee replacement but there are a couple of factors that she needs to get under control before she is a candidate for knee replacement surgery.  We discussed her weight.  She currently has a BMI of 57.3.  She is at increased risk of infection and wound problems. I did explain that she would need to try to get her BMI to about 40 or below.  This means that she would need to lose approximately 101 lbs.  We discussed weight loss plans including the option of pursuing bariatric surgery.  She was previously scheduled for bariatric surgery but had to cancel because she lost her insurance.  I will submit a referral for consultation for bariatric surgeon at [facility].  Next, we discussed her mobility and knee stiffness.  She will need to be able to have enough stamina to do her postoperative rehab.  She did voice understanding.  I also discussed the risks of stiffness following a knee  replacement secondary to the amount of stiffness she has now.  We discussed repeating injections.  She does not seem interested since she did not have much relief with previous injections and to be honest I do not feel that they will help her much given her advanced bone-on-bone osteoarthritis.  Lastly, we discussed pain management.  I did explain that we do not manage pain through orthopedic clinic.  I will submit a referral to Dr. Netty Starring for temporary pain management until she is a candidate for total knee replacements.  I gave her one of our cards and instructed her to contact the office once she has met her requirements and I will schedule her a consultation with Dr. Horton Chin to consider total knee replacements.    Patient seemed pleased and agreeable to this plan. All questions answered to the patient's satisfaction.    This note was partially generated using MModal Fluency Direct system, and there may be some incorrect words, spellings, and punctuation that were not noted in checking the note before saving.      Lenore Cordia, PA-C 09/15/2017, 10:08    I agree with the above note and changes have been made where appropriate.    Rulon Eisenmenger, MD  09/18/2017  11:05

## 2017-09-17 ENCOUNTER — Encounter (INDEPENDENT_AMBULATORY_CARE_PROVIDER_SITE_OTHER): Payer: Self-pay | Admitting: Internal Medicine

## 2017-09-17 ENCOUNTER — Ambulatory Visit (INDEPENDENT_AMBULATORY_CARE_PROVIDER_SITE_OTHER): Payer: Self-pay | Admitting: INTERNAL MEDICINE CARDIOVASCULAR DISEASE

## 2017-09-17 ENCOUNTER — Ambulatory Visit (INDEPENDENT_AMBULATORY_CARE_PROVIDER_SITE_OTHER): Payer: Medicare PPO | Admitting: Internal Medicine

## 2017-09-17 ENCOUNTER — Other Ambulatory Visit (INDEPENDENT_AMBULATORY_CARE_PROVIDER_SITE_OTHER): Payer: Self-pay | Admitting: Internal Medicine

## 2017-09-17 VITALS — BP 107/79 | HR 97 | Resp 18 | Wt 322.4 lb

## 2017-09-17 DIAGNOSIS — I4819 Other persistent atrial fibrillation: Secondary | ICD-10-CM

## 2017-09-17 DIAGNOSIS — I481 Persistent atrial fibrillation: Secondary | ICD-10-CM

## 2017-09-17 DIAGNOSIS — I208 Other forms of angina pectoris: Secondary | ICD-10-CM

## 2017-09-17 DIAGNOSIS — R0609 Other forms of dyspnea: Secondary | ICD-10-CM

## 2017-09-17 DIAGNOSIS — I10 Essential (primary) hypertension: Secondary | ICD-10-CM

## 2017-09-17 NOTE — Progress Notes (Addendum)
[facility]  [address]  [place] [address]  Phone: [phone]  Fax: [phone]    Encounter Date: 09/17/2017    Patient ID:  Alexandra Nunez  YQI:[id number]    DOB: 25-Dec-1955  Age: 62 y.o. female    Subjective:     Chief Complaint   Patient presents with   . FOLLOWUP INDIVIDUAL THERAPY-MED CHECK   . Shortness of Breath   . Leg Swelling   . Dizziness   . Fatigue     Mrs. Heart min is 62 year old with morbid obesity history of paroxysmal atrial fibrillation have been persistently in AFib over the last several weeks.  She has been evaluated by Dr. Deatra Canter with plans for a TEE guided cardioversion.  She has been consistent with her Coumadin with therapeutic INRs over the last few months.  She came to our office today for evaluation as her heart rate has been running fast bed per event monitor.  Today in the office EKG suggested AFib with RVR of 141.  She is almost on 460 mg of Cardizem daily.  No complaint of chest pain however walking from her bedroom to the bathroom she will get out of breath.  She has morbid obesity with bad knees.  She is due to be evaluated by bariatric medicine in [place] later this month.  She is over 300 lb.  A prior test for sleep apnea has been inconclusive.  Compliant with her medications as listed below.  She has been therapeutic on her INR consistently over the value of 2.         Current Outpatient Medications   Medication Sig   . acetaminophen (TYLENOL 8 HOUR) 650 mg Oral Tablet Sustained Release Take 1,300 mg by mouth Twice daily   . albuterol sulfate (PROVENTIL OR VENTOLIN OR PROAIR) 90 mcg/actuation Inhalation HFA Aerosol Inhaler Take 2 Puffs by inhalation Every 6 hours as needed   . budesonide-formoterol (SYMBICORT) 160-4.5 mcg/actuation Inhalation HFA Aerosol Inhaler Take 2 Puffs by inhalation Twice daily   . buPROPion (WELLBUTRIN XL) 300 mg extended release 24 hr tablet Take 300 mg by mouth Once a day   . clonazePAM (KLONOPIN) 1 mg Oral Tablet Take  1 mg by mouth Twice daily   . dilTIAZem (CARDIZEM CD) 120 mg Oral Capsule, Sust. Release 24 hr Take 1 Cap (120 mg total) by mouth Once a day   . dilTIAZem (CARDIZEM CD) 360 mg Oral Capsule, Sust. Release 24 hr Take 1 Cap (360 mg total) by mouth Once a day for 30 days   . furosemide (LASIX) 40 mg Oral Tablet Take 40 mg by mouth Once a day   . hydrocortisone 2.5 % Cream Apply topically Twice daily   . melatonin 1 mg Oral Tablet Take 2 mg by mouth Every night   . methocarbamol (ROBAXIN) 500 mg Oral Tablet Take 500 mg by mouth Three times a day   . Warfarin (COUMADIN) 6 mg Oral Tablet Take 1 Tab (6 mg total) by mouth Every evening for 90 days 1 1/2 tablet on Wednesday and Friday and 1 tablet daily every other day.     Allergies   Allergen Reactions   . Peanut Butter Flavor [Flavoring Agent]    . Prednisone      High doses   . Protonix [Pantoprazole]    . Sulfa (Sulfonamides)      Past Medical History:   Diagnosis Date   . Asthma    . Atrial fibrillation (CMS HCC)    .  Borderline diabetes    . Chronic joint pain    . CKD (chronic kidney disease) stage 3, GFR 30-59 ml/min (CMS HCC)    . Essential hypertension    . GERD (gastroesophageal reflux disease)    . Knee pain, bilateral    . Obesity    . Osteoarthritis    . Uterine cancer (CMS [facility])          Past Surgical History:   Procedure Laterality Date   . COLONOSCOPY     . HX APPENDECTOMY     . HX DILATION AND CURETTAGE     . LAPAROSCOPIC TOTAL HYSTERECTOMY           Family Medical History:     Problem Relation (Age of Onset)    Coronary Artery Disease Mother, Father    HTN <20 y.o. Mother, Father            Social History     Tobacco Use   . Smoking status: Never Smoker   . Smokeless tobacco: Never Used   Substance Use Topics   . Alcohol use: Never     Frequency: Never   . Drug use: Never       Review of Systems   Constitutional: Positive for fatigue.   Respiratory: Positive for shortness of breath.    Cardiovascular: Positive for palpitations and leg swelling.      Musculoskeletal: Positive for arthralgias and gait problem.     Objective:   Vitals: BP 107/79   Pulse 97   Resp 18   Wt (!) 146.2 kg (322 lb 6.4 oz)   SpO2 94%   BMI 55.34 kg/m         Physical Exam   Constitutional: She is oriented to person, place, and time. She appears well-developed and well-nourished. No distress.   HENT:   Head: Normocephalic and atraumatic.   Eyes: No scleral icterus.   Neck: Neck supple. No JVD present.   Cardiovascular: Normal rate and regular rhythm. Exam reveals no gallop and no friction rub.   No murmur heard.  Pulmonary/Chest: Effort normal and breath sounds normal. No respiratory distress. She has no wheezes. She has no rales.   Abdominal: Soft. Bowel sounds are normal.   Musculoskeletal: She exhibits no edema or tenderness.   Neurological: She is alert and oriented to person, place, and time.   Skin: Skin is warm and dry. No rash noted. She is not diaphoretic. No erythema.   Psychiatric: She has a normal mood and affect. Her behavior is normal.   Nursing note and vitals reviewed.      Assessment & Plan:     ENCOUNTER DIAGNOSES     ICD-10-CM   1. Persistent atrial fibrillation (CMS HCC) I48.1   2. Morbid obesity (CMS HCC) E66.01   3. Essential hypertension I10   4. DOE (dyspnea on exertion) R06.09   5. Anginal equivalent (CMS HCC) I47.46     62 year old female with persistent atrial fibrillation with RVR.  She is quite symptomatic with minor activity.  I offered her to send her to the hospital with cardioversion as an inpatient.  Versus outpatient on Monday morning she elected to go for Monday morning outpatient.  Advised her at in between if she felt bad at any point feel free to go to the emergency room.  We will plan to do cardioversion since she has been consistently therapeutic on her INR I did not feel that she needs to have a transesophageal echo at  this point.  Will consider to decrease her Cardizem or discontinue after her cardioversion and put her on amiodarone.   Obviously amiodarone could potentially interfere with her Coumadin with more potentiation so she needs to have a closer followup on her INR levels.  I strongly urged her to pursue her bariatric consult and weight loss.  Also I recommended a pharmacologic MPS to rule out any underlying coronary artery disease.  She agrees with the plan.  Orders Placed This Encounter   . TRANSTHORACIC ECHOCARDIOGRAM ([facility] ONLY)   . LEXISCAN MPS SPECT/REST& STRESS/CARDIOLITE([facility])       Return if symptoms worsen or fail to improve, for As scheduled with Dr. Deatra Canter.    Zella Ball, MD, [facility], [facility]  Interventional Cardiologist  Associate Professor   [facility] Department of Medicine, Section of Cardiology    09/17/2017 13:11

## 2017-09-17 NOTE — Nursing Note (Signed)
Pt did not have a med list with them at visit - went over list with patient   Advised patient to check AVS list with medicines at home and call if any discrepancies    -Confirmed correct pharmacy with patient for e-scribing   Soyla Dryer, MA  09/17/2017, 11:51

## 2017-09-17 NOTE — Telephone Encounter (Signed)
I received a phone call from [place] at [facility] patient is in Afib HR 182 Symptoms: Skipped beat, heart racing Activities: Heavy  Report was obtained and scanned into patients chart. Will forward to [facility] to review.    [place], [place]  09/17/2017, 12:33

## 2017-09-18 ENCOUNTER — Ambulatory Visit (INDEPENDENT_AMBULATORY_CARE_PROVIDER_SITE_OTHER): Payer: Self-pay | Admitting: Internal Medicine

## 2017-09-18 NOTE — Telephone Encounter (Signed)
-----   Message from [facility] sent at 09/18/2017  2:44 PM EDT -----  I called pt to give her dates and times of mps and echo (which are on 10/06/17) and she states that her testing was supposed to be scheduled this Monday at the latest. I doesn't state that it was to be scheduled on Monday in the order or in the office visit note. I checked with [name], per her request, to see if they had anything sooner and they did not. She would like a nurse to return her call.

## 2017-09-18 NOTE — Telephone Encounter (Signed)
I spoke with patient discussed with patient she is supposed to be here on Monday for her cardioversion. We are working on getting her stress test and echo scheduled for Monday. Patient verbalized understanding and does not have any questions or concerns at this time.    [place], [place]  09/18/2017, 15:12

## 2017-09-21 ENCOUNTER — Other Ambulatory Visit (INDEPENDENT_AMBULATORY_CARE_PROVIDER_SITE_OTHER): Payer: Self-pay | Admitting: Internal Medicine

## 2017-09-21 DIAGNOSIS — R9431 Abnormal electrocardiogram [ECG] [EKG]: Secondary | ICD-10-CM

## 2017-09-21 DIAGNOSIS — I4891 Unspecified atrial fibrillation: Secondary | ICD-10-CM

## 2017-09-21 MED ORDER — DILTIAZEM CD 360 MG CAPSULE,EXTENDED RELEASE 24 HR
360.0000 mg | ORAL_CAPSULE | Freq: Every day | ORAL | 0 refills | Status: DC
Start: 2017-09-21 — End: 2017-10-14

## 2017-09-21 MED ORDER — AMIODARONE 200 MG TABLET: 200 mg | Tab | Freq: Two times a day (BID) | ORAL | 0 refills | 0 days | Status: DC

## 2017-09-21 MED ORDER — AMIODARONE 200 MG TABLET
200.0000 mg | ORAL_TABLET | Freq: Every day | ORAL | 0 refills | Status: DC
Start: 2017-09-29 — End: 2017-10-14

## 2017-09-22 ENCOUNTER — Encounter (INDEPENDENT_AMBULATORY_CARE_PROVIDER_SITE_OTHER): Payer: Self-pay | Admitting: Nurse Practitioner

## 2017-09-30 ENCOUNTER — Encounter (HOSPITAL_BASED_OUTPATIENT_CLINIC_OR_DEPARTMENT_OTHER): Payer: Self-pay

## 2017-10-01 DIAGNOSIS — R9431 Abnormal electrocardiogram [ECG] [EKG]: Secondary | ICD-10-CM

## 2017-10-06 ENCOUNTER — Other Ambulatory Visit (INDEPENDENT_AMBULATORY_CARE_PROVIDER_SITE_OTHER): Payer: Self-pay

## 2017-10-06 DIAGNOSIS — R0609 Other forms of dyspnea: Secondary | ICD-10-CM

## 2017-10-06 DIAGNOSIS — I4819 Other persistent atrial fibrillation: Secondary | ICD-10-CM

## 2017-10-06 DIAGNOSIS — I208 Other forms of angina pectoris: Secondary | ICD-10-CM

## 2017-10-13 NOTE — Progress Notes (Signed)
[facility]  [address]  [place] [address]  Phone: [phone]  Fax: [phone]    Encounter Date: 10/14/2017    Patient ID:  Alexandra Nunez  GDJ:[id number]    DOB: [birth year]  Age: 62 y.o. female    Subjective:     Chief Complaint   Patient presents with   . FOLLOWUP INDIVIDUAL THERAPY-MED CHECK   . Leg Pain   . Leg Swelling   . Fatigue     in legs     HPI   Mrs. Dutan is a pleasant 62 year old lady with a history of persistent atrial fibrillation and morbid obesity. She underwent synchronized cardioversion at [facility] on 09/21/17 for her symptomatic atrial fibrillation and converted to normal sinus rhythm with 200J x1. Cardizem was reduced from 360mg  qam and 120mg  qpm to 360mg  qd, and she was started on Amiodarone 200mg  twice a day for one week that was then to be reduced to 200mg  once daily. A transesophageal echocardiogram was completed on 10/01/17 and showed preserved EF at 60%, no significant valvular abnormalities and adequate size of LA appendage with no smoke or thrombus. She remained in sinus rhythm at the time of her TEE. She is on thromboembolic prophylaxis with Coumadin with a goal INR between 2-3. Today, she admits that her BP was running high, so she took her extra dose of Cardizem and now has slower heart rates in the 40's with fatigue and dizziness. She also admits that she only took 2 tablets of Amiodarone and then stopped it due to reading the possible side effects. She also states that she is not interested in restarting Amiodarone therapy. Her EKG today shows a heart rate 47bpm, and her BP has been running in the 962'I-297'L systolic. She denies any known recurrence of afib, palpitations, chest pain, edema, orthopnea, PND or syncope.       Current Outpatient Medications   Medication Sig   . acetaminophen (TYLENOL 8 HOUR) 650 mg Oral Tablet Sustained Release Take 1,300 mg by mouth Twice daily   . albuterol sulfate (PROVENTIL OR VENTOLIN OR PROAIR) 90 mcg/actuation  Inhalation HFA Aerosol Inhaler Take 2 Puffs by inhalation Every 6 hours as needed   . amiodarone (PACERONE) 200 mg Oral Tablet Take 1 Tab (200 mg total) by mouth Twice daily for 7 days (Patient not taking: Reported on 10/14/2017)   . budesonide-formoterol (SYMBICORT) 160-4.5 mcg/actuation Inhalation HFA Aerosol Inhaler Take 2 Puffs by inhalation Twice daily   . buPROPion (WELLBUTRIN XL) 300 mg extended release 24 hr tablet Take 300 mg by mouth Once a day   . clonazePAM (KLONOPIN) 1 mg Oral Tablet Take 1 mg by mouth Twice daily   . Diltiazem (CARDIZEM CD) 120 mg Oral Capsule, Sust. Release 24 hr Take 1 Cap (120 mg total) by mouth Twice daily   . furosemide (LASIX) 40 mg Oral Tablet Take 40 mg by mouth Once a day   . hydrocortisone 2.5 % Cream Apply topically Twice daily   . losartan (COZAAR) 25 mg Oral Tablet Take 1 Tab (25 mg total) by mouth Once a day   . melatonin 1 mg Oral Tablet Take 2 mg by mouth Every night   . methocarbamol (ROBAXIN) 500 mg Oral Tablet Take 500 mg by mouth Three times a day   . Warfarin (COUMADIN) 6 mg Oral Tablet Take 1 Tab (6 mg total) by mouth Every evening for 90 days 1 1/2 tablet on Wednesday and Friday and 1 tablet daily every  other day.     Allergies   Allergen Reactions   . Peanut Butter Flavor [Flavoring Agent]    . Prednisone      High doses   . Protonix [Pantoprazole]    . Sulfa (Sulfonamides)      Past Medical History:   Diagnosis Date   . Asthma    . Atrial fibrillation (CMS HCC)    . Borderline diabetes    . Chronic joint pain    . CKD (chronic kidney disease) stage 3, GFR 30-59 ml/min (CMS HCC)    . Essential hypertension    . GERD (gastroesophageal reflux disease)    . Knee pain, bilateral    . Obesity    . Osteoarthritis    . Uterine cancer (CMS [facility])          Past Surgical History:   Procedure Laterality Date   . COLONOSCOPY     . HX APPENDECTOMY     . HX DILATION AND CURETTAGE     . LAPAROSCOPIC TOTAL HYSTERECTOMY           Family Medical History:     Problem Relation (Age of  Onset)    Coronary Artery Disease Mother, Father    HTN <20 y.o. Mother, Father            Social History     Tobacco Use   . Smoking status: Never Smoker   . Smokeless tobacco: Never Used   Substance Use Topics   . Alcohol use: Never     Frequency: Never   . Drug use: Never       Review of Systems   Constitutional: Positive for fatigue. Negative for activity change, appetite change, chills, fever and unexpected weight change.   HENT: Negative for hearing loss.    Respiratory: Negative for apnea, cough, chest tightness, shortness of breath and wheezing.    Cardiovascular: Negative for chest pain, palpitations and leg swelling.   Gastrointestinal: Negative for abdominal pain and blood in stool.   Genitourinary: Negative for hematuria.   Musculoskeletal: Positive for arthralgias and gait problem. Negative for myalgias.   Neurological: Positive for dizziness and light-headedness. Negative for tremors, seizures, syncope, facial asymmetry, speech difficulty and headaches.   Psychiatric/Behavioral: Negative for sleep disturbance.     Objective:   Vitals: BP (!) 143/63   Pulse 50   Resp 18   Ht 1.626 m (5\' 4" )   Wt (!) 150.1 kg (330 lb 14.4 oz)   SpO2 94%   BMI 56.80 kg/m         Physical Exam   Constitutional: She is oriented to person, place, and time. She appears well-developed.   Neck: Normal range of motion. Neck supple. No JVD present.   Cardiovascular: Regular rhythm, S1 normal, S2 normal and normal pulses. Bradycardia present. Exam reveals no gallop and no friction rub.   No murmur heard.  Pulmonary/Chest: Effort normal and breath sounds normal. No respiratory distress. She has no wheezes. She has no rales. She exhibits no tenderness.   Abdominal: Soft. Bowel sounds are normal. There is no tenderness.   Musculoskeletal: Normal range of motion. She exhibits no edema or tenderness.   Neurological: She is alert and oriented to person, place, and time.   Psychiatric: She has a normal mood and affect. Her  behavior is normal.   Nursing note and vitals reviewed.    Last CBC        WBC   HGB   HCT  MCV   Platelets      07/25/17 0555 5.3 12.9 38.1 89.1 203        Last BMP       Na   K   Cl   CO2   BUN   Cr   Calcium   Glucose   Glucose-Fasting        07/25/17 0555 142 3.3 111 23 13 0.71 8.0 120           Last Value    TSH   Date Value Ref Range Status   07/25/2017 1.669 0.340 - 5.600 uIU/mL Final          Assessment & Plan:     ENCOUNTER DIAGNOSES     ICD-10-CM   1. Persistent atrial fibrillation (CMS [facility]): S/p cardioversion at [facility] on 09/21/17 with return to NSR. 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 Will reduce her Cardizem to 120mg  twice a day and continue Coumadin. Will have her return in 2 weeks with a repeat EKG. I48.1   2. Morbid obesity (CMS [place]): Diet modification and weight loss recommended. E66.01   3. DOE (dyspnea on exertion): Improved since her cardioversion. R06.09   4. Anginal equivalent (CMS [place]): Her LMPS has not been completed. Will make arrangements for this to be scheduled. I20.8   5. Essential hypertension: Will add Losartan 25mg  qd with a repeat BMP in 1 week. She is to take Cardizem 120mg  twice a day and her Lasix 40mg  once daily as prescribed. I10   6. Sinus bradycardia: medication adjustments as above. She agreed to closely monitor her HR at home. R00.1       Orders Placed This Encounter   . BASIC METABOLIC PANEL   . ECG - ADD ON IN CLINIC - SAME DAY   . Diltiazem (CARDIZEM CD) 120 mg Oral Capsule, Sust. Release 24 hr   . losartan (COZAAR) 25 mg Oral Tablet     Patient was seen independently with case reviewed with Dr. Gildardo Pounds.    Return in about 2 weeks (around 10/28/2017).    Samuella Bruin, PA-C  10/14/2017, 12:56  [facility], Section of Cardiology

## 2017-10-14 ENCOUNTER — Encounter (INDEPENDENT_AMBULATORY_CARE_PROVIDER_SITE_OTHER): Payer: Self-pay | Admitting: Physician Assistant

## 2017-10-14 ENCOUNTER — Telehealth (INDEPENDENT_AMBULATORY_CARE_PROVIDER_SITE_OTHER): Payer: Self-pay | Admitting: Nurse Practitioner

## 2017-10-14 ENCOUNTER — Ambulatory Visit (INDEPENDENT_AMBULATORY_CARE_PROVIDER_SITE_OTHER): Payer: Medicare PPO | Admitting: Physician Assistant

## 2017-10-14 VITALS — BP 143/63 | HR 50 | Resp 18 | Ht 64.0 in | Wt 330.9 lb

## 2017-10-14 DIAGNOSIS — I208 Other forms of angina pectoris: Secondary | ICD-10-CM

## 2017-10-14 DIAGNOSIS — R001 Bradycardia, unspecified: Secondary | ICD-10-CM

## 2017-10-14 DIAGNOSIS — I481 Persistent atrial fibrillation: Secondary | ICD-10-CM

## 2017-10-14 DIAGNOSIS — R0609 Other forms of dyspnea: Secondary | ICD-10-CM

## 2017-10-14 DIAGNOSIS — I10 Essential (primary) hypertension: Secondary | ICD-10-CM

## 2017-10-14 DIAGNOSIS — I4819 Other persistent atrial fibrillation: Secondary | ICD-10-CM

## 2017-10-14 MED ORDER — LOSARTAN 25 MG TABLET
25.0000 mg | ORAL_TABLET | Freq: Every day | ORAL | 5 refills | Status: DC
Start: 2017-10-14 — End: 2017-10-29

## 2017-10-14 MED ORDER — DILTIAZEM CD 120 MG CAPSULE,EXTENDED RELEASE 24 HR
120.00 mg | ORAL_CAPSULE | Freq: Two times a day (BID) | ORAL | 5 refills | Status: DC
Start: 2017-10-14 — End: 2017-12-18

## 2017-10-14 NOTE — Patient Instructions (Signed)
Take Cardizem 120mg  twice a day  Take Losartan 25mg  once a day  Monitor your BP and HR at home  Blood work to be done in 1 week after medicine changes.  Call us if your BP remains elevated >140/90. We can consider further increasing Losartan if needed.  2 week follow up with a repeat EKG

## 2017-10-14 NOTE — Nursing Note (Signed)
Pt did not have a med list with them at visit - went over list with patient   Advised patient to check AVS list with medicines at home and call if any discrepancies    -Confirmed correct pharmacy with patient for e-scribing   Youlanda Roys, LPN  05/01/2710, 92:90

## 2017-10-14 NOTE — Telephone Encounter (Signed)
Patient notified of INR of 2.0. Per Arlis Porta, APRN, continue current dose of Coumadin and recheck in one month. Voiced understanding.

## 2017-10-16 NOTE — Telephone Encounter (Signed)
Spoke to pt and she verbalized that she is unwilling to complete the stress test at this time. She is afraid that she will be put into Afib if she does it. Pt states that she has been doing well since her last office visit w/ the medication changes and verbalized no concerns at this time. Pt plans to follow up with her next scheduled apt and will call if any issues arise before then.  Hoy Finlay, RN  10/16/2017, 16:09

## 2017-10-16 NOTE — Telephone Encounter (Signed)
-----   Message from Gilmore Laroche sent at 10/14/2017  3:58 PM EDT -----      ----- Message -----  From: Samuella Bruin, PA-C  Sent: 10/14/2017   3:44 PM  To: Gilmore Laroche    Please have the nurses call to see if she's willing to get it done. We should make sure her heart rates are improved prior to proceeding forward though. So maybe next visit.  Thanks,  Samuella Bruin, PA-C  10/14/2017, 15:45    ----- Message -----  From: Gilmore Laroche  Sent: 10/14/2017   3:43 PM  To: Samuella Bruin, PA-C    Patient rescheduled her appt to 09/22/17 @ 8:00, and then she was a no show for the appointment.Do we need to r/s Lexiscan?  Thanks,  Renee  ----- Message -----  From: Oris Drone  Sent: 10/14/2017  12:57 PM  To: Gilmore Laroche    Can you see if she was notified about her stress test? I don't see where it was completed.    Thanks,  Samuella Bruin, PA-C  10/14/2017, 12:58

## 2017-10-22 ENCOUNTER — Encounter (INDEPENDENT_AMBULATORY_CARE_PROVIDER_SITE_OTHER): Payer: Self-pay | Admitting: Physician Assistant

## 2017-10-22 NOTE — Telephone Encounter (Signed)
-----   Message from Samuella Bruin, PA-C sent at 10/22/2017 11:47 AM EDT -----  Regarding: FW: Visit Follow-Up Question  Contact: [phone]  Have her get her blood work so we can check her electrolytes and renal function. May consider increasing losartan after review of results.  Thanks,  Samuella Bruin, PA-C  10/22/2017, 11:48  [facility], Section of Cardiology           ----- Message -----  From: Youlanda Roys, LPN  Sent: 8/32/5498   8:15 AM  To: Samuella Bruin, PA-C  Subject: FW: Visit Follow-Up Question                     Hey, I am planning to call her here in a bit, and ask her if these readings were before her medication or after and if she's feeling okay.Is There anything extra you want me to tell her?  Thanks,   [employer].  ----- Message -----  From: Hildred Alamin, LPN  Sent: 2/64/1583   7:43 AM  To: [facility] Triage  Subject: FW: Visit Follow-Up Question                         ----- Message -----  From: Alexandra Nunez  Sent: 10/22/2017   1:54 AM  To: [facility]  Subject: Visit Follow-Up Question                         Alexandra Nunez,    My BP has been up since the night of 7/16.  I took an extra 25 mg Losartan on 7/16 and on 7/17. I just took an extra Losartan about 1:30am .  I had a little more sodium than usual on 7/17, so I will be more careful and monitor sodium tomorrow. My BP has been usually running between 156/76 or like 15 minutes ago 169/80.  I have also been feeling shaky inside and hope it isn't a precursor to an AFIB attack.  Right now my pulse is at 55, and yesterday and the day before it was between 55 to 63.     I will be going into the lab tomorrow for that blood work, which I forgot about until today.    I wanted to let you know.    Alexandra Nunez  [phone]

## 2017-10-22 NOTE — Telephone Encounter (Addendum)
Spoke to patient, she informed me that she has been taking the losartan 25mg  2x a day, and gave me two new BP readings. 10/22/17 NOON 167/76 PULSE 54, 10/22/17 1:45 PM, 144/70 Pulse 58.   Gave her Ashley's message, and she confirmed that she is leaving her house now to go get her lab work done now. I told her once we get those results, we will call her back with what Caryl Pina decides.  Youlanda Roys, LPN  1/44/8185, 63:14        Attempted to call patient, [facility] for return call back.   Youlanda Roys, LPN  9/70/2637, 85:88

## 2017-10-26 ENCOUNTER — Ambulatory Visit (INDEPENDENT_AMBULATORY_CARE_PROVIDER_SITE_OTHER): Payer: Self-pay | Admitting: Physician Assistant

## 2017-10-26 NOTE — Telephone Encounter (Addendum)
Patient returned call and gave me new BP readings.   10/23/17 NOON BP  171/77 (Unsure of Pulse)  10/24/17 168/78, unsure of pulse  Patient informed me that she has been taking Losartan 25mg , 4 times a day for a total of 100mg , (increased on her own). Patient has not been consistent with recording of her BP and Pulse.     Spoke with Wilma Flavin, PA again regarding the increase of medication, she decided to wait till the patient comes into the office on 10/29/17 at 2pm to see Dr. Deatra Canter, to see what he thinks about her Bps. Caryl Pina,  Asked me to ask the patient to continue the Losartan 25mg  4 times a day for a total of 100mg , and to keep a very detailed log of the BP/Pulse.    Relayed Wilma Flavin PA, message to the patient, and   Education provided to keep a detailed report of the BP/PULSE.     Patient Voiced understanding, and agrees to keep the detailed report and bring it to her appointment on 10/29/17 at 2Pm.   Youlanda Roys, LPN  9/48/5462, 70:35

## 2017-10-26 NOTE — Telephone Encounter (Signed)
Spoke to patient on the 18th regarding her labs and possibly increasing her Losartan per A. [place], [place]. Received patients labs today, from 10/22/17.  Revievwed them with AAshok Cordia PA Today, and she asked me to call the patient to make sure she has no allergy to HTZ.. Because Caryl Pina wants to change Losartan 25mg  to Losartan/HTZ 50mg /12.5mg  and have follow up lab work in 7 days.  Attempted to call patient, and spoke to her husband TOM. He agreed to give the patient a message to call me ASAP regarding her BPS/LABS.   Youlanda Roys, LPN  5/57/3220, 25:42

## 2017-10-27 ENCOUNTER — Encounter (INDEPENDENT_AMBULATORY_CARE_PROVIDER_SITE_OTHER): Payer: Self-pay | Admitting: INTERNAL MEDICINE CARDIOVASCULAR DISEASE

## 2017-10-29 ENCOUNTER — Ambulatory Visit (AMBULATORY_SURGERY_CENTER): Payer: Medicare PPO

## 2017-10-29 ENCOUNTER — Encounter (INDEPENDENT_AMBULATORY_CARE_PROVIDER_SITE_OTHER): Payer: Self-pay | Admitting: Internal Medicine

## 2017-10-29 ENCOUNTER — Other Ambulatory Visit (INDEPENDENT_AMBULATORY_CARE_PROVIDER_SITE_OTHER): Payer: Self-pay | Admitting: Physician Assistant

## 2017-10-29 ENCOUNTER — Ambulatory Visit (INDEPENDENT_AMBULATORY_CARE_PROVIDER_SITE_OTHER): Payer: Medicare PPO | Admitting: Internal Medicine

## 2017-10-29 DIAGNOSIS — I10 Essential (primary) hypertension: Secondary | ICD-10-CM

## 2017-10-29 DIAGNOSIS — I480 Paroxysmal atrial fibrillation: Secondary | ICD-10-CM

## 2017-10-29 DIAGNOSIS — E669 Obesity, unspecified: Secondary | ICD-10-CM

## 2017-10-29 MED ORDER — LOSARTAN 100 MG-HYDROCHLOROTHIAZIDE 12.5 MG TABLET
1.0000 | ORAL_TABLET | Freq: Every day | ORAL | 11 refills | Status: DC
Start: 2017-10-29 — End: 2018-02-11

## 2017-10-29 NOTE — Progress Notes (Signed)
[facility]  [address]  [place] [address]  Phone: [phone]  Fax: [phone]    Encounter Date: 10/29/2017    Patient ID:  Alexandra Nunez  PXT:[id number]    DOB: 26-Jul-1955  Age: 62 y.o. female    Subjective:     Chief Complaint   Patient presents with   . FOLLOWUP INDIVIDUAL THERAPY-MED CHECK   . No Complaints     Alexandra Nunez is a pleasant 62 year old lady with a history of persistent atrial fibrillation and morbid obesity. She underwent synchronized cardioversion at [facility] on 09/21/17 for her symptomatic atrial fibrillation and converted to normal sinus rhythm with 200J x1. Cardizem was reduced from 360mg  qam and 120mg  qpm to 360mg  qd, and she was started on Amiodarone 200mg  twice a day for one week that was then to be reduced to 200mg  once daily . A transesophageal echocardiogram was completed on 10/01/17 and showed preserved EF at 60%, no significant valvular abnormalities and adequate size of LA appendage with no smoke or thrombus. She remained in sinus rhythm at the time of her TEE. She is on thromboembolic prophylaxis with Coumadin with a goal INR between 2-3.   During today's office visit she has no complaints of chest pain palpitations syncope or near syncope.  Her blood pressure is elevated.  We have decreased in the previous visit her Cardizem to 120 mg twice a day.  She has been using her losartan at 50 mg twice a day.  She has discontinued her amiodarone.            Current Outpatient Medications   Medication Sig   . acetaminophen (TYLENOL 8 HOUR) 650 mg Oral Tablet Sustained Release Take 1,300 mg by mouth Twice daily   . albuterol sulfate (PROVENTIL OR VENTOLIN OR PROAIR) 90 mcg/actuation Inhalation HFA Aerosol Inhaler Take 2 Puffs by inhalation Every 6 hours as needed   . amiodarone (PACERONE) 200 mg Oral Tablet Take 1 Tab (200 mg total) by mouth Twice daily for 7 days (Patient not taking: Reported on 10/14/2017)   . budesonide-formoterol (SYMBICORT) 160-4.5  mcg/actuation Inhalation HFA Aerosol Inhaler Take 2 Puffs by inhalation Twice daily   . buPROPion (WELLBUTRIN XL) 300 mg extended release 24 hr tablet Take 300 mg by mouth Once a day   . clonazePAM (KLONOPIN) 1 mg Oral Tablet Take 1 mg by mouth Twice daily   . Diltiazem (CARDIZEM CD) 120 mg Oral Capsule, Sust. Release 24 hr Take 1 Cap (120 mg total) by mouth Twice daily   . furosemide (LASIX) 40 mg Oral Tablet Take 40 mg by mouth Once a day   . hydrocortisone 2.5 % Cream Apply topically Twice daily   . Ibuprofen (MOTRIN) 200 mg Oral Tablet Take 200 mg by mouth Three times a day   . losartan-hydrochlorothiazide (HYZAAR) 100-12.5 mg Oral Tablet Take 1 Tab by mouth Once a day   . melatonin 1 mg Oral Tablet Take 2 mg by mouth Every night   . methocarbamol (ROBAXIN) 500 mg Oral Tablet Take 500 mg by mouth Three times a day   . Warfarin (COUMADIN) 6 mg Oral Tablet Take 1 Tab (6 mg total) by mouth Every evening for 90 days 1 1/2 tablet on Wednesday and Friday and 1 tablet daily every other day.     Allergies   Allergen Reactions   . Prednisone      High doses   . Protonix [Pantoprazole]    . Sulfa (Sulfonamides)  Past Medical History:   Diagnosis Date   . Asthma    . Atrial fibrillation (CMS HCC)    . Borderline diabetes    . Chronic joint pain    . CKD (chronic kidney disease) stage 3, GFR 30-59 ml/min (CMS HCC)    . Essential hypertension    . GERD (gastroesophageal reflux disease)    . Knee pain, bilateral    . Obesity    . Osteoarthritis    . Uterine cancer (CMS [facility])          Past Surgical History:   Procedure Laterality Date   . CARDIOVERSION     . COLONOSCOPY     . HX APPENDECTOMY     . HX DILATION AND CURETTAGE     . LAPAROSCOPIC TOTAL HYSTERECTOMY           Family Medical History:     Problem Relation (Age of Onset)    Coronary Artery Disease Mother, Father    HTN <20 y.o. Mother, Father            Social History     Tobacco Use   . Smoking status: Never Smoker   . Smokeless tobacco: Never Used   Substance Use  Topics   . Alcohol use: Never     Frequency: Never   . Drug use: Never       Review of Systems   Constitutional: Positive for fatigue. Negative for activity change, appetite change, chills, diaphoresis, fever and unexpected weight change.   HENT: Negative for hearing loss.    Eyes: Negative for visual disturbance.   Respiratory: Negative for apnea, cough, chest tightness, shortness of breath and wheezing.    Cardiovascular: Negative for chest pain, palpitations and leg swelling.   Gastrointestinal: Negative for abdominal pain, blood in stool, nausea and vomiting.   Endocrine: Negative for cold intolerance and heat intolerance.   Genitourinary: Negative for hematuria.   Musculoskeletal: Positive for arthralgias and back pain. Negative for myalgias.   Skin: Negative for color change and rash.   Neurological: Negative for dizziness, seizures, syncope, speech difficulty, weakness and light-headedness.   Hematological: Does not bruise/bleed easily.   Psychiatric/Behavioral: Negative for sleep disturbance. The patient is not nervous/anxious.      Objective:   Vitals: BP (!) 183/72 (Site: Left)   Pulse 51   Resp 18   Ht 1.626 m (5\' 4" )   Wt (!) 145.3 kg (320 lb 6.4 oz)   SpO2 97%   BMI 55.00 kg/m         Physical Exam   Constitutional: She is oriented to person, place, and time. She appears well-developed and well-nourished. No distress.   HENT:   Head: Normocephalic and atraumatic.   Eyes: No scleral icterus.   Neck: Neck supple. No JVD present.   Cardiovascular: Normal rate and regular rhythm. Exam reveals no gallop and no friction rub.   No murmur heard.  Pulmonary/Chest: Effort normal and breath sounds normal. No respiratory distress. She has no wheezes. She has no rales.   Abdominal: Soft. Bowel sounds are normal.   Musculoskeletal: She exhibits no edema or tenderness.   Neurological: She is alert and oriented to person, place, and time.   Skin: Skin is warm and dry. No rash noted. She is not diaphoretic. No  erythema.   Psychiatric: She has a normal mood and affect. Her behavior is normal.   Nursing note and vitals reviewed.      Assessment & Plan:  ENCOUNTER DIAGNOSES     ICD-10-CM   1. Morbid obesity (CMS [place]) E66.01   2. PAF (paroxysmal atrial fibrillation) (CMS [facility]) I48.0   3. Essential hypertension I47    62 year old female with paroxysmal AFib maintaining sinus rhythm.  She is off her amiodarone may have decrease the Cardizem to 120 mg twice a day.  Her heart rate is in the low 50s.  Her blood pressure is uncontrolled soft prescribed for her losartan with HCTZ.  I recommended for her to do a basic chemistry in a week if her kidney function is good and a rectal lytes is good blood sugar is good then she may continue.    Orders Placed This Encounter   . BASIC METABOLIC PANEL, FASTING   . losartan-hydrochlorothiazide (HYZAAR) 100-12.5 mg Oral Tablet       No follow-ups on file.    Zella Ball, MD

## 2017-10-29 NOTE — Nursing Note (Signed)
Pt did not have a med list with them at visit - went over list with patient   Advised patient to check AVS list with medicines at home and call if any discrepancies    -Confirmed correct pharmacy with patient for e-scribing     [place], [place]  10/29/2017, 16:01

## 2017-10-29 NOTE — Patient Instructions (Signed)
Take losartan/HCTZ 100/12.5 mg once a day.  Do a basic chemistry in a week.  If the kidney function is good and the potassium stays good thank continue with current medication.  Send [place] a report of blood pressure readings for the next 2 weeks.

## 2017-10-30 NOTE — Progress Notes (Signed)
Bariatric Surgery  Operated by [facility]      Patient Name: Alexandra Nunez  MRN# [id number]  DOB: [birth year]  Date of Service: 10/29/2017    [name] attended the Bariatric Information Session.    Luz Lex, CLINICAL CARE COORDINATOR 10/30/2017, 16:06

## 2017-11-05 ENCOUNTER — Ambulatory Visit (INDEPENDENT_AMBULATORY_CARE_PROVIDER_SITE_OTHER): Payer: Self-pay | Admitting: INTERNAL MEDICINE CARDIOVASCULAR DISEASE

## 2017-11-13 ENCOUNTER — Encounter: Payer: Self-pay | Admitting: Internal Medicine

## 2017-11-13 ENCOUNTER — Ambulatory Visit (INDEPENDENT_AMBULATORY_CARE_PROVIDER_SITE_OTHER): Payer: Medicare Other | Admitting: Internal Medicine

## 2017-11-13 VITALS — BP 132/80 | HR 61 | Ht 64.0 in | Wt 324.0 lb

## 2017-11-13 DIAGNOSIS — J4540 Moderate persistent asthma, uncomplicated: Secondary | ICD-10-CM

## 2017-11-13 MED ORDER — PREDNISONE 10 MG PO TABS: ORAL_TABLET | ORAL | 11 refills | Status: DC

## 2017-11-13 MED ORDER — ESOMEPRAZOLE MAGNESIUM 20 MG PO CPDR: DELAYED_RELEASE_CAPSULE | ORAL | 11 refills | Status: DC

## 2017-11-13 NOTE — Patient Instructions (Addendum)
Resume nexium 20 mg Take 30-60 min before first meal of the day   GERD (REFLUX)  is an extremely common cause of respiratory symptoms just like yours , many times with no obvious heartburn at all.    It can be treated with medication, but also with lifestyle changes including elevation of the head of your bed (ideally with 6 inch  bed blocks),  Smoking cessation, avoidance of late meals, excessive alcohol, and avoid fatty foods, chocolate, peppermint, colas, red wine, and acidic juices such as orange juice.  NO MINT OR MENTHOL PRODUCTS SO NO COUGH DROPS  USE SUGARLESS CANDY INSTEAD (Jolley ranchers or Stover's or Life Savers) or even ice chips will also do - the key is to swallow to prevent all throat clearing. NO OIL BASED VITAMINS - use powdered substitutes.    If breathing cough or congestion worse > Prednisone 10 mg take  4 each am x 2 days,   2 each am x 2 days,  1 each am x 2 days and stop   Return in 6 months when in town

## 2017-11-13 NOTE — Progress Notes (Signed)
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 Last seen  on April 13, 2006 with the  recommendation that she maintain Symbicort at 160/4.5 two puffs b.i.d.  Take empiric Protonix at 40 mg b.i.d. before meals, which she failed to  do, and try Singulair 10 mg q.p.m. She said that Singulair did  nothing for her, and stopped it after a couple of weeks but is  convinced that Symbicort is helping, and that she is using less  albuterol than normal. It turns out, however, that she is still using  albuterol 4 or 5 times a day. She states she does not typically wake up  at night and need it.   Pulmonary function tests were reviewed from January 22, and indicate an  FEV1 of only 56% predicted with a ratio of 53% and a 15% improvement  after bronchodilators. rec gerd rx plus symbicort 160 2bid > all symptoms resolved once learned technique    08/24/2013 1st  office visit/ Daisean Brodhead   Off gerd rx/ on symbicort 160 2bid and ACEi now Chief Complaint  Patient presents with  . Advice Only    Old MW pt to reestablish care for Asthma, sleep study.   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 Why wakes 8 am not refreshed funny in the head and feels drowsy 10- noon. Only drives 10 min and on trips gets drowsy but husband always drives her.  rec Stop lisinopril Start micardis 80 mg one half daily  You need to breath clean air to reduce your risk of asthma flares  Read for 30 min before bed nightly - if you do wake up no light We will schedule you for a sleep study in one month     07/01/2016   Extended  ov/Shreya Lacasse re: reestablish re asthma / back on ACEi Chief  Complaint  Patient presents with  . Follow-up    asthma doingok, insurance will pay for symbicort but the copay is too high  for the past month has needed alb twice daily including one neb rx which is unusual for her with progressive doe "just about anything" and assoc dry day > noct cough  Difficulty sleeping due to nasal congestion/ cough sleeping mostly L side now rec Stop lisinopril and start losartan 50 mg daily in it's place, increase lasix as per your PC  Continue symbicort 160 Take 2 puffs first thing in am and then another 2 puffs about 12 hours later.  Work on inhaler technique:   Only use your albuterol as a rescue medication  Whenever you are coughing > Try prilosec otc 20mg   Take 30-60 min before first meal of the day and Pepcid ac (famotidine) 20 mg one @  bedtime until cough is completely gone for at least a week without the need for cough suppression GERD diet        08/01/2016  Extended f/u ov/Abijah Roussel re:  dtc asthma on symb 160 2bid and avg saba once daily  Chief Complaint  Patient presents with  . Follow-up  4wk rov- pt states there was no improvment in breathing the first three weeks after last OV, pt had switched back to lisinopril due to losartan not controlling BP. pt reports occ non prod cough clear mucus & mild wheezing  thinks better not due to off acei but due to allergies  better since rained so changed back to acei instead of arb  Using saba q hs x early March 2018 which is a new issue for her and remains a problem, thinks may be due to noct HB rec Plan A = Automatic = Symbicort 80 Take 2 puffs first thing in am and then another 2 puffs about 12 hours later.  Plan B = Backup Only use your albuterol(ventolin)  Whenever cough/ wheeze for any reason or having heart burn >  Try prilosec otc 20mg   Take 30-60 min before first meal of the day and Pepcid ac (famotidine) 20 mg one @  bedtime until cough is completely gone for at least a week without the need for cough  suppression GERD .  I f breathing/ wheezing / coughing getting worse on this regimen,  You will either need to change back to an ARB (losartan is the cheapest but may not be the best) and off lisinopril or Let Dr [name] or your Primary doctor refer you to another specialist for example an Allergist / asthma specialist in [facility] as there is nothing more I can do for you in this circumstance. Please schedule a follow up office visit in 4 weeks, sooner if needed with medication formulary > did not do   email 09/28/16: "You were right about the Lisinopril and for the most part I don't have to much congestion. I still have some, but I still feel that symbicort and albuterol are handling it pretty good. Not perfect, but pretty good. "    11/07/2016 extended acute f/u ov/Daveigh Batty re: re-establish re cough/sob [name] saba use   Chief Complaint  Patient presents with  . Acute Visit    Increased cough since June 2018- prod with clear to white sputum.  She is using her albuterol inhaler 3-4 x per day on average.   worse with cough/ wheeze x 2 months not on 80 not on ppi/h2hs  Not able to verify meds as did not bring them as requested, seeing multiple providers "each tells me something different"  rec Depomedrol 120 mg IM today  Prilosec 20 mg x 2 x 30 min before bfast and supper and take pepcid 20 mg at bedtime  For drainage / throat tickle try take CHLORPHENIRAMINE  4 mg - take one every 4 hours as needed - available over the counter- may cause drowsiness so start with just a bedtime dose or two and see how you tolerate it before trying in daytime Plan A = Automatic = Symbicort 80 Take 2 puffs first thing in am and then another 2 puffs about 12 hours later.  Plan B = Backup Only use your albuterol as a rescue medication For cough you can use robitussin and supplement with tessalon 100 up to 2 every 6 hours as needed        02/09/17 NP eval Med cal Pt assistance for symb   04/22/2017  f/u  ov/Zannie Runkle re:  Asthma / uacs/ no med cal  Chief Complaint  Patient presents with  . Acute Visit    Pt states her breathing has been progressively worse since last ov Aug 2018.  She has also been coughing more  with clear to white.  She states she gets SOB with walking just a few steps.  She states she is having trouble sleeping due to cough.  She is using her albuterol inhaler at least 4 x per day.   sleeps propped up on 2 pillows L side / no 02 and uses avg alb one x per noct  Cough is more daytime min white mucus assoc with sob x a a few steps x 6 m gradually worse Has not been able to take ppi as instructed due to diarrhea with protonix rec GERD  Stop protonix and start aciphex 20 mg Take 30- 60 min before your first and last meals of the day  Change symbicort to 160 = dulera 200 Take 2 puffs first thing in am and then another 2 puffs about 12 hours later.  Only use your albuterol (Ventolin) as a rescue medication   If all else fails > Prednisone 10 mg take  4 each am x 2 days,   2 each am x 2 days,  1 each am x 2 days and stop    11/13/2017  f/u ov/Carrieanne Kleen re: asthma with component of uacs flared off gerd rx  Chief Complaint  Patient presents with  . Acute Visit    She c/o prod cough for the past 6 wks- clear to light yellow sputum.    Dyspnea:  Limited by knees > sob  Cough: worse x 2 weeks since stopped ppi / nexium   SABA use: hardly ever 02: no    Now living part time in [place] taking care of her mother     No obvious day to day or daytime variability or assoc excess/ purulent sputum or mucus plugs or hemoptysis or cp or chest tightness, subjective wheeze or overt sinus or hb symptoms.   Sleeping: L side slt hob elevation  without nocturnal  or early am exacerbation  of respiratory  c/o's or need for noct saba. Also denies any obvious fluctuation of symptoms with weather or environmental changes or other aggravating or alleviating factors except as outlined above   No unusual  exposure hx or h/o childhood pna/ asthma or knowledge of premature birth.  Current Allergies, Complete Past Medical History, Past Surgical History, Family History, and Social History were reviewed in [employer] record.  ROS  The following are not active complaints unless bolded Hoarseness, sore throat, dysphagia, dental problems, itching, sneezing,  nasal congestion or discharge of excess mucus or purulent secretions, ear ache,   fever, chills, sweats, unintended wt loss or wt gain, classically pleuritic or exertional cp,  orthopnea pnd or arm/hand swelling  or leg swelling, presyncope, palpitations, abdominal pain, anorexia, nausea, vomiting, diarrhea  or change in bowel habits or change in bladder habits, change in stools or change in urine, dysuria, hematuria,  rash, arthralgias, visual complaints, headache, numbness, weakness or ataxia or problems with walking or coordination,  change in mood or  memory.        Current Meds  Medication Sig  . acetaminophen (TYLENOL) 325 MG tablet Take 650 mg by mouth every 6 (six) hours as needed.  Marland Kitchen albuterol (PROVENTIL HFA;VENTOLIN HFA) 108 (90 Base) MCG/ACT inhaler Inhale 2 puffs into the lungs every 4 (four) hours as needed for wheezing.  . budesonide-formoterol (SYMBICORT) 160-4.5 MCG/ACT inhaler Inhale 2 puffs into the lungs 2 (two) times daily.  Marland Kitchen diltiazem (CARDIZEM CD) 120 MG 24 hr capsule Take 120 mg by mouth 2 (two) times daily.  Marland Kitchen  fluticasone (FLONASE) 50 MCG/ACT nasal spray Place 2 sprays daily as needed into both nostrils for allergies or rhinitis.  Marland Kitchen losartan-hydrochlorothiazide (HYZAAR) 100-12.5 MG tablet Take 1 tablet by mouth daily.  Marland Kitchen warfarin (COUMADIN) 6 MG tablet Take 1 tablet (6 mg total) by mouth as directed.              Objective:   Physical Exam  11/13/2017         324  04/22/2017        324  11/07/2016          329  08/01/2016        335  07/01/2016        336   08/24/13 361 lb (163.749 kg)  08/19/13  359 lb (162.841 kg)  03/08/13 369 lb (167.377 kg)     Obese w/c bound wf nad   Vital signs reviewed - Note on arrival 02 sats  94% on RA       HEENT: nl dentition, turbinates bilaterally, and oropharynx. Nl external ear canals without cough reflex   NECK :  without JVD/Nodes/TM/ nl carotid upstrokes bilaterally   LUNGS: no acc muscle use,  Nl contour chest with lots of transmitted upper airway wheezing  CV:  RRR  no s3 or murmur or increase in P2, and 1+ sym lower ext pitting edema   ABD:  Massively obese  With poor inspiratory excursion  . No bruits or organomegaly appreciated, bowel sounds nl  MS:   ext warm without deformities, calf tenderness, cyanosis or clubbing No obvious joint restrictions   SKIN: warm and dry without lesions    NEURO:  alert, approp, nl sensorium with  no motor or cerebellar deficits apparent.           Assessment & Plan:

## 2017-11-14 ENCOUNTER — Encounter: Payer: Self-pay | Admitting: Internal Medicine

## 2017-11-14 NOTE — Assessment & Plan Note (Signed)
-   rec trial off acei 08/24/2013 and again 07/01/2016 x 3 weeks only - Spirometry 07/01/2016  FEV1 1.21 (47%)  Ratio 69 mild curvature  - FENO 07/01/2016  =   39  - Allergy profile 08/01/2016 >  Eos 0.5 /  IgE  12 neg RAST  - 09/28/16 confirmed much better off acei (see email)  - 11/07/2016    try symbicort 80 2bid/ depomedrol 120 - 04/22/2017  After extensive coaching inhaler device  effectiveness =    75% > try symb 160 and avoid pred if possible / add gerd rx = aciphex as can't tol protonix - 11/13/2017 flared off ppi > resume plus pred x 6 days if not improving on ppi    Clearly large upper airway component but difficult to sort out so rx with ppi and then keep the prednisone on hand if not improving   - The proper method of use, as well as anticipated side effects, of a metered-dose inhaler are discussed and demonstrated to the patient.   I had an extended discussion with the patient reviewing all relevant studies completed to date and  lasting 15 to 20 minutes of a 25 minute visit    See device teaching which extended face to face time for this visit   Each maintenance medication was reviewed in detail including most importantly the difference between maintenance and prns and under what circumstances the prns are to be triggered using an action plan format that is not reflected in the computer generated alphabetically organized AVS.    Please see AVS for specific instructions unique to this visit that I personally wrote and verbalized to the the pt in detail and then reviewed with pt  by my nurse highlighting any  changes in therapy recommended at today's visit to their plan of care.

## 2017-11-14 NOTE — Assessment & Plan Note (Signed)
Body mass index is 55.61 kg/m.  -  trending no change  Lab Results  Component Value Date   TSH 1.388 01/19/2017     Contributing to gerd risk/ doe/reviewed the need and the process to achieve and maintain neg calorie balance > defer f/u primary care including intermittently monitoring thyroid status

## 2017-11-24 ENCOUNTER — Telehealth (INDEPENDENT_AMBULATORY_CARE_PROVIDER_SITE_OTHER): Payer: Self-pay | Admitting: Nurse Practitioner

## 2017-11-24 ENCOUNTER — Encounter (INDEPENDENT_AMBULATORY_CARE_PROVIDER_SITE_OTHER): Payer: Self-pay | Admitting: Physician Assistant

## 2017-11-24 DIAGNOSIS — M25562 Pain in left knee: Secondary | ICD-10-CM

## 2017-11-24 DIAGNOSIS — M25561 Pain in right knee: Principal | ICD-10-CM

## 2017-11-24 NOTE — Telephone Encounter (Signed)
Called and spoke with patient, states Dr. Cranston Neighbor does not want to see her as he will not order narcotics for her. States he is primarily doing injections in backs now. Would like referral to [facility] or [place].

## 2017-11-25 ENCOUNTER — Other Ambulatory Visit (INDEPENDENT_AMBULATORY_CARE_PROVIDER_SITE_OTHER): Payer: Self-pay | Admitting: Nurse Practitioner

## 2017-11-25 DIAGNOSIS — G8929 Other chronic pain: Secondary | ICD-10-CM

## 2017-11-25 DIAGNOSIS — M2550 Pain in unspecified joint: Principal | ICD-10-CM

## 2017-11-26 ENCOUNTER — Encounter (INDEPENDENT_AMBULATORY_CARE_PROVIDER_SITE_OTHER): Payer: Self-pay | Admitting: Physician Assistant

## 2017-11-27 ENCOUNTER — Encounter (INDEPENDENT_AMBULATORY_CARE_PROVIDER_SITE_OTHER): Payer: Self-pay

## 2017-11-30 ENCOUNTER — Encounter (HOSPITAL_BASED_OUTPATIENT_CLINIC_OR_DEPARTMENT_OTHER): Payer: Self-pay | Admitting: Physical Medicine & Rehabilitation

## 2017-11-30 ENCOUNTER — Ambulatory Visit: Payer: Medicare PPO | Admitting: Physical Medicine & Rehabilitation

## 2017-11-30 VITALS — BP 134/86 | HR 59 | Temp 98.1°F | Resp 16 | Ht 64.0 in | Wt 324.0 lb

## 2017-11-30 DIAGNOSIS — M1710 Unilateral primary osteoarthritis, unspecified knee: Secondary | ICD-10-CM | POA: Insufficient documentation

## 2017-11-30 DIAGNOSIS — M25562 Pain in left knee: Secondary | ICD-10-CM

## 2017-11-30 DIAGNOSIS — M25561 Pain in right knee: Secondary | ICD-10-CM | POA: Insufficient documentation

## 2017-11-30 DIAGNOSIS — M179 Osteoarthritis of knee, unspecified: Secondary | ICD-10-CM

## 2017-11-30 NOTE — H&P (Signed)
Physical Medicine and Rehabilitation New Patient Visit  Patient Name: Alexandra Nunez  MRN: [id number]  DOB: 05/06/1955  DOS: 11/30/2017    Chief Complaint:   Chief Complaint   Patient presents with   . Knee Pain     Bilateral     History of Present Illness: Alexandra Nunez is a pleasant 62 y.o.female who presents to [place] [place] for initial evaluation for the above stated complaint. The patient complains of chronic bilateral knee pain. The pain is exacerbated with standing and walking, but improves with sitting and resting. Pain scale: 7/10. 1000 mg Tylenol and 400 mg Motrin has been utilized as treatment with some relief. Physical therapy has been utilized as treatment and failed to provide significant relief/improvement. The patient denies sxs of fever, chills, night sweats, weight changes, chest pain, shortness of breath, appetite changes, bowel or bladder incontinence, saddle paresthesias, headaches, dizziness, sleep changes, and all other complaints at this time. PMHx includes: A-fib, CKD, and Uterine Cancer. PSHx includes: Cardioversion and Appendectomy. The patient denies a personal history of cancer.     Past Medical History:  Past Medical History:   Diagnosis Date   . Asthma    . Atrial fibrillation (CMS HCC)    . Borderline diabetes    . Chronic joint pain    . CKD (chronic kidney disease) stage 3, GFR 30-59 ml/min (CMS HCC)    . Essential hypertension    . GERD (gastroesophageal reflux disease)    . Knee pain, bilateral    . Obesity    . Osteoarthritis    . Uterine cancer (CMS [facility])      Past Surgical History:  Past Surgical History:   Procedure Laterality Date   . CARDIOVERSION     . COLONOSCOPY     . HX APPENDECTOMY     . HX DILATION AND CURETTAGE     . LAPAROSCOPIC TOTAL HYSTERECTOMY       Social History:  Social History     Socioeconomic History   . Marital status: Married     Spouse name: Not on file   . Number of children: Not on file   . Years of  education: Not on file   . Highest education level: Not on file   Occupational History   . Not on file   Social Needs   . Financial resource strain: Not on file   . Food insecurity:     Worry: Not on file     Inability: Not on file   . Transportation needs:     Medical: Not on file     Non-medical: Not on file   Tobacco Use   . Smoking status: Never Smoker   . Smokeless tobacco: Never Used   Substance and Sexual Activity   . Alcohol use: Never     Frequency: Never   . Drug use: Never   . Sexual activity: Not on file   Lifestyle   . Physical activity:     Days per week: Not on file     Minutes per session: Not on file   . Stress: Not on file   Relationships   . Social connections:     Talks on phone: Not on file     Gets together: Not on file     Attends religious service: Not on file     Active member of club or organization: Not on file     Attends  meetings of clubs or organizations: Not on file     Relationship status: Not on file   . Intimate partner violence:     Fear of current or ex partner: Not on file     Emotionally abused: Not on file     Physically abused: Not on file     Forced sexual activity: Not on file   Other Topics Concern   . Not on file   Social History Narrative   . Not on file     Family History:  Family Medical History:     Problem Relation (Age of Onset)    Coronary Artery Disease Mother, Father    HTN <20 y.o. Mother, Father        Medications:  Outpatient Medications Marked as Taking for the 11/30/17 encounter (Office Visit) with Peter Garter, MD   Medication Sig   . acetaminophen (TYLENOL 8 HOUR) 650 mg Oral Tablet Sustained Release Take 1,300 mg by mouth Twice daily   . albuterol sulfate (PROVENTIL OR VENTOLIN OR PROAIR) 90 mcg/actuation Inhalation HFA Aerosol Inhaler Take 2 Puffs by inhalation Every 6 hours as needed   . budesonide-formoterol (SYMBICORT) 160-4.5 mcg/actuation Inhalation HFA Aerosol Inhaler Take 2 Puffs by inhalation Twice daily   . Diltiazem (CARDIZEM CD) 120 mg Oral  Capsule, Sust. Release 24 hr Take 1 Cap (120 mg total) by mouth Twice daily   . furosemide (LASIX) 40 mg Oral Tablet TAKE 1 TABLET BY MOUTH EVERY DAY   . hydrocortisone 2.5 % Cream Apply topically Twice daily   . Ibuprofen (MOTRIN) 200 mg Oral Tablet Take 200 mg by mouth Three times a day   . losartan-hydrochlorothiazide (HYZAAR) 100-12.5 mg Oral Tablet Take 1 Tab by mouth Once a day   . melatonin 1 mg Oral Tablet Take 5 mg by mouth Every night      Allergies:  Allergies   Allergen Reactions   . Prednisone      High doses   . Protonix [Pantoprazole]    . Sulfa (Sulfonamides)      Review of Systems:   Constitutional: Negative for fever, chills, night sweats, or weight changes.   Eyes: Negative for vision changes.  Cardiovascular: Negative for chest pain or palpitations.  Respiratory: Negative for shortness of breath.  Gastrointestinal: Negative for abdominal pain, nausea, or vomiting.  Genitourinary: Negative for bladder incontinence.   Musculoskeletal: Positive for bilateral knee pain.  Neurological: Negative for bowel or bladder incontinence, saddle paresthesias, numbness, tingling, headaches, or dizziness.   Skin: Negative for rash.  Psychiatric: Negative for sleep changes.   All other systems reviewed and are negative. Please see scanned new patient packet.    Physical Examination:  BP 134/86   Pulse 59   Temp 36.7 C (98.1 F) (Tympanic)   Resp 16   Ht 1.626 m (5\' 4" )   Wt (!) 147 kg (324 lb)   SpO2 98%   BMI 55.61 kg/m   Constitutional: 62 y.o. female in no acute distress. Awake. Cooperative. Alert and oriented x4.   HEENT: Normal oropharynx. Extra ocular movements intact. Normocephalic and atraumatic.   Neck: Supple, symmetric, trachea midline, no masses. The thyroid appears normal, no thyromegaly.   Cardiovascular: Regular rate and rhythm. No murmurs, rubs, or gallops. Normal pulses in all four extremities.  Respiratory: Lungs are clear to auscultation. No wheezing, rales, or  rhonchi.  Gastrointestinal: Abdomen is soft and non-tender with normal bowel sounds. No hepatosplenomegaly.   Skin: Warm, dry, and no rashes.  Lymphatic: No palpable lymphadenopathy.  Neuromuscular: Alert and oriented x3 with normal speech. Attention span and concentration normal. Cognitive function is normal. Coordination is normal. Cranial nerves 2-12 intact. Sensation is normal in upper and lower extremities. Normal muscle tone in upper and lower extremities.    Gait: Gait Patterns: Wide-based, Antalgic, accompanied by a wheelchair for assistance.    Lumbar Spine: ROM: Flexion, extension, rotation, lateral bending are within normal limits. Pain with facet loading. Stability: no step off, stable spine.    Knee Exam: Patient has tenderness in the bilateral knees. Range of Motion: Flexion and extension are limited, bilaterally.   Motor examination:   Arm Right Left Leg Right Left   Shoulder abduction (C5) 5/5 5/5 Hip flexion (L2) 5/5 5/5   Wrist extension (C6) 5/5 5/5 Knee extension (L3) 5/5 5/5   Wrist flexion (C7) 5/5 5/5 Foot Dorsi Flexion (L4) 5/5 5/5   Finger flexion (C8) 5/5 5/5 Toe extension (L5) 5/5 5/5   Finger ab/adduction (T1) 5/5 5/5 Foot plantar flexion (S1) 5/5 5/5    Reflexes:    Bicep BR Triceps Patella Achilles Babinski Ankle Clonus Hoffman's   Right 2+ 2+ 2+ 2+ 2+ Downgoing Not present Not present   Left 2+ 2+ 2+ 2+ 2+ Downgoing Not present Not present   The patient was fully assessed, evaluated and examined today.    Diagnostic Studies:   08/20/2017 XR KNEE LEFT 4 OR MORE VIEWS: There is severe narrowing of joint space with very proximal sclerosis and spurring from degenerative change. Osteopenia is present. There is a large calcification in the suprapatellar region measuring 3.5 x 1.7 cm. Severe degenerative changes.  08/20/2017 XR KNEE RIGHT 4 OR MORE VIEWS: Diffuse osteopenia is noted. There is severe narrowing of joint space. There is abundant sclerosis and spurring from degenerative  change. No fracture or effusion is seen. Severe degenerative changes.    Assessment and Plan:     ICD-10-CM    1. 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 Bilateral knee pain M25.561 Refer to [place]    Will follow up as needed, unless otherwise noted. Patient expressed understanding of this plan and has no further questions at this time.        I am scribing for, and in the presence of, Dr. Peter Garter for services provided on 11/30/2017.  Rebekah Megimose, SCRIBE     I personally performed the services described in this documentation, as scribed  in my presence, and it is both accurate  and complete.    Peter Garter, MD      Peter Garter, MD  Medical Director, [facility] [place] Department of Neurosurgery

## 2017-12-01 ENCOUNTER — Encounter (INDEPENDENT_AMBULATORY_CARE_PROVIDER_SITE_OTHER): Payer: Self-pay | Admitting: Student in an Organized Health Care Education/Training Program

## 2017-12-01 NOTE — Progress Notes (Signed)
Multi State Controlled Substance Patient Full Name Report Report Date 12/01/2017   From 12/01/2016 To 12/01/2017 Date of Birth [birth year]   Prescription Count 6   Last Name [facility] First Name [name] Name                   Patients included in report that appear to match the search criteria.   Last Name First Name Middle Name Gender Address         Prescriber Name Prescriber [place] Name [place] Zip Rx Written Date Rx Dispense Date & Date Sold Rx Number Product Name Strength Qty Days # of Refill Sched Payment Type   Alexandra Nunez, Alexandra Nunez   (Reported to [place], [place]                          [id number] [place] [id number] 07/06/2017 07/15/2017 07/15/2017 [id number] TRAMADOL HCL  50 MG 33 30 0/0 CIV Medicare   Loralee Pacas (MD/PHD)            [id number]  [facility] [id number] 06/24/2017 06/24/2017 06/26/2017 [id number] APAP/HYDROCODONE BITARTRATE 325 MG-10 MG 14 7 0/0 CII Private Pay   Loralee Pacas (MD/PHD)            [id number] [place] [facility] [id number] 06/16/2017 06/16/2017 06/16/2017 [id number] Hydrocodone Bitartrate And Acetaminophen 10 mg/1;325 mg/1 14 7  0/0 CII Private Pay   Loralee Pacas (MD/PHD)            [id number] [facility] [id number] 06/08/2017 06/08/2017 06/09/2017 [id number] Hydrocodone Bitartrate And Acetaminophen 10 mg/1;325 mg/1 14 7  0/0 CII Private Pay   Alexandra Nunez, Alexandra Nunez   (Reported to Other States)   Aurea Graff [id number] [facility] [id number] 01/12/2017 01/12/2017 01/12/2017 [id number] CLONAZEPAM 1 MG TABLET  90.0 45 0/0 CIV    [place], MD, PHD [id number] [place] [facility] [id number] 12/30/2016 12/30/2016 12/30/2016 [id number] HYDROCODONE-ACETAMIN 7.5-325  60.0 30 0/0 CII

## 2017-12-02 ENCOUNTER — Ambulatory Visit
Payer: Medicare PPO | Attending: Student in an Organized Health Care Education/Training Program | Admitting: Student in an Organized Health Care Education/Training Program

## 2017-12-02 ENCOUNTER — Encounter (INDEPENDENT_AMBULATORY_CARE_PROVIDER_SITE_OTHER): Payer: Self-pay | Admitting: Student in an Organized Health Care Education/Training Program

## 2017-12-02 VITALS — BP 118/67 | HR 57 | Temp 97.5°F | Resp 20 | Ht 64.57 in | Wt 327.8 lb

## 2017-12-02 DIAGNOSIS — M170 Bilateral primary osteoarthritis of knee: Secondary | ICD-10-CM

## 2017-12-02 DIAGNOSIS — I129 Hypertensive chronic kidney disease with stage 1 through stage 4 chronic kidney disease, or unspecified chronic kidney disease: Secondary | ICD-10-CM | POA: Insufficient documentation

## 2017-12-02 DIAGNOSIS — I4891 Unspecified atrial fibrillation: Secondary | ICD-10-CM | POA: Insufficient documentation

## 2017-12-02 DIAGNOSIS — Z79899 Other long term (current) drug therapy: Secondary | ICD-10-CM | POA: Insufficient documentation

## 2017-12-02 DIAGNOSIS — M25562 Pain in left knee: Secondary | ICD-10-CM

## 2017-12-02 DIAGNOSIS — M25561 Pain in right knee: Secondary | ICD-10-CM

## 2017-12-02 DIAGNOSIS — M1711 Unilateral primary osteoarthritis, right knee: Secondary | ICD-10-CM

## 2017-12-02 DIAGNOSIS — Z7901 Long term (current) use of anticoagulants: Secondary | ICD-10-CM | POA: Insufficient documentation

## 2017-12-02 DIAGNOSIS — N183 Chronic kidney disease, stage 3 (moderate): Secondary | ICD-10-CM | POA: Insufficient documentation

## 2017-12-02 NOTE — Progress Notes (Signed)
Alexandra Nunez  12/02/2017  [id number]    HPI:  Pt is a 62yo female with PMH Afib on Coumadin, CKD stage III, severe OA of the knees who presents as a referral from Dr. Bernette Redbird for b/l knee pain.  She has severe OA but cannot have surgery at this time because she needs to lose weight.  She is looking into bypass surgery.  She would like to be evaluated for pain medication as well as alternative pain options.  Pain is as described below and is located in both knees.  She cannot take NSAIDS d/t CKD.  She has failed PT and intra-articular steroid in hyaluronic acid injections.    Chief Complaint:  Chief Complaint   Patient presents with   . Knee Pain     Severity: severe  Timing: when walking  Worse with movement, better with rest  Description: aching  Duration: years    Previous procedures:  As per HPI    Previous medications tried:  Tylenol: Takes 2000mg /day  NSAIDS: Takes 400mg  BID    [place] checked    [place] Pain Rating Scale     On a scale of 0-10, during the past 24 hours, pain has interfered with you usual activity: 6     On a scale of 0-10, during the past 24 hours, pain has interfered with your sleep: 7    On a scale of 0-10, during the past 24 hours, pain has affected your mood: 7     On a scale of 0-10, during the past 24 hours, pain has contributed to your stress: 7     On a scale of 0-10, what is your overall pain Rating: 7         No flowsheet data found.    Past Medical History:   Diagnosis Date   . Asthma    . Atrial fibrillation (CMS HCC)    . Borderline diabetes    . Chronic joint pain    . CKD (chronic kidney disease) stage 3, GFR 30-59 ml/min (CMS HCC)    . Essential hypertension    . GERD (gastroesophageal reflux disease)    . Knee pain, bilateral    . Obesity    . Osteoarthritis    . Uterine cancer (CMS [facility])          Past Surgical History:   Procedure Laterality Date   . CARDIOVERSION     . COLONOSCOPY     . HX APPENDECTOMY     . HX DILATION AND CURETTAGE     . HX HYSTERECTOMY     . LAPAROSCOPIC TOTAL  HYSTERECTOMY           Family Medical History:     Problem Relation (Age of Onset)    Coronary Artery Disease Mother, Father    HTN <20 y.o. Mother, Father            Social History     Socioeconomic History   . Marital status: Married     Spouse name: Not on file   . Number of children: Not on file   . Years of education: Not on file   . Highest education level: Not on file   Tobacco Use   . Smoking status: Never Smoker   . Smokeless tobacco: Never Used   Substance and Sexual Activity   . Alcohol use: Never     Frequency: Never   . Drug use: Never     Current Outpatient Medications   Medication  Sig   . acetaminophen (TYLENOL 8 HOUR) 650 mg Oral Tablet Sustained Release Take 1,300 mg by mouth Twice daily   . albuterol sulfate (PROVENTIL OR VENTOLIN OR PROAIR) 90 mcg/actuation Inhalation HFA Aerosol Inhaler Take 2 Puffs by inhalation Every 6 hours as needed   . amiodarone (PACERONE) 200 mg Oral Tablet Take 1 Tab (200 mg total) by mouth Twice daily for 7 days (Patient not taking: Reported on 10/14/2017)   . budesonide-formoterol (SYMBICORT) 160-4.5 mcg/actuation Inhalation HFA Aerosol Inhaler Take 2 Puffs by inhalation Twice daily   . buPROPion (WELLBUTRIN XL) 300 mg extended release 24 hr tablet Take 300 mg by mouth Once a day   . clonazePAM (KLONOPIN) 1 mg Oral Tablet Take 1 mg by mouth Twice daily   . Diltiazem (CARDIZEM CD) 120 mg Oral Capsule, Sust. Release 24 hr Take 1 Cap (120 mg total) by mouth Twice daily   . furosemide (LASIX) 40 mg Oral Tablet TAKE 1 TABLET BY MOUTH EVERY DAY   . hydrocortisone 2.5 % Cream Apply topically Twice daily   . Ibuprofen (MOTRIN) 200 mg Oral Tablet Take 200 mg by mouth Three times a day   . losartan-hydrochlorothiazide (HYZAAR) 100-12.5 mg Oral Tablet Take 1 Tab by mouth Once a day   . melatonin 1 mg Oral Tablet Take 5 mg by mouth Every night    . methocarbamol (ROBAXIN) 500 mg Oral Tablet Take 500 mg by mouth Three times a day   . Warfarin (COUMADIN) 6 mg Oral Tablet Take 1 Tab (6  mg total) by mouth Every evening for 90 days 1 1/2 tablet on Wednesday and Friday and 1 tablet daily every other day.     Allergies   Allergen Reactions   . Prednisone      High doses   . Protonix [Pantoprazole]    . Sulfa (Sulfonamides)      BP 118/67   Pulse 57   Temp 36.4 C (97.5 F) (Oral)   Resp 20   Ht 1.64 m (5' 4.57")   Wt (!) 148.7 kg (327 lb 13.2 oz)   SpO2 95%   BMI 55.29 kg/m     LABS:  No results found for this or any previous visit (from the past 24 hour(s)).    ROS:  GENERAL: Negative for fevers or chills  NEUROLOGIC: Negative for any seizures, ataxia.  HEENT: Negative for any head trauma, neck trauma  CARDIAC: Negative for any chest pain, dyspnea on exertion  PULMONARY: Negative for any shortness of breath, wheezing  GASTROINTESTINAL: Negative for any abdominal pain, nausea, vomiting  GENITOURINARY: Negative for any dysuria, hematuria, incontinence.  INTEGUMENTARY: Negative for any rashes, cuts.  RHEUMATOLOGIC: Negative for photosensitive rashes. + b/l knee pain  HEMATOLOGIC: Negative for any abnormal bruising or bleeding.    STUDIES:  As per HPI    Xray L knee (07/27/17)  IMPRESSION:  Severe degenerative changes.    Xray R knee (07/27/17)  IMPRESSION:  Severe degenerative changes.    PE:  CONST: Well developed, no acute distress.  Morbidly obese  HEENT: normocephalic, atraumatic  EYES: Conjunctiva normal, no scleral icterus  CV: Acyanotic, no JVD  RESP: Respiratory effort normal, no tachypnea  ABD: Non-distended  SKIN: No obvious rash or lesions    MUSCULOSKELETAL No obvious deformity.  Normal gait and station.   Diffuse TTP in the medial and lateral joint line.  Neg ant/post drawer  NEURO: A&O x 3, moving all 4 extremities  PSYCH: Intact judgment and insight.  Non-combative. Normal affect    A/P:  Pt presents with b/l severe OA.  I have offered her genicular nerve block trial which she has reluctantly accepted.  I have also offered her therapy, dietary consult to help with weight loss for  surgery and topical medications which she has refused.  She states "those don't help me and they make the pain worse".  She gets very upset that I will not commit to continuing her Norco that she was prescribed in [place].      This patient presented to the pain clinic with the hopes that our pain clinic would continue a prescription for an opioid pain medication that was initiated by a provider outside of this practice.  I have explained to the patient that it is my professional opinion that their pain condition does not indicate treatment with opioid pain medication at this time.  I have also explained that this pain clinic is multidisciplinary in nature and have offered her therapy as described above.  I have not committed to the use of opioids in her care and I have also not excluded it at this time.  I have simply stated that I will not be prescribing that medication at this time as we have other alternatives that are more appropriate.  She became upset with me and continued to press me for information as to what would happen if she failed the nerve block.  She wanted to know if she would be a candidate for the medication at that point.  I told her that I will not make any promises as to future therapy options, but that we would continue to work with her and seek the best care plan together. She does have other medical problems which limit her treatment options.  That being said, her behavior here is concerning to me.                                 Patient was referred for pain treatment. They have tried and failed conservative care in the form of modified activities, physical therapy and anti-inflammatory medications or pain medications for at least six weeks in the last 6 months.      I have reviewed and updated as appropriate the past medical, family and social history.    Any imaging studies provided by the patient for this office visit were reviewed and discussed with the patient. When practical,  the images were reviewed in the presence of the patient.    Vernell Morgans, MD  12/02/2017, 14:48

## 2017-12-03 ENCOUNTER — Encounter (INDEPENDENT_AMBULATORY_CARE_PROVIDER_SITE_OTHER): Payer: Self-pay | Admitting: Nurse Practitioner

## 2017-12-09 ENCOUNTER — Ambulatory Visit (INDEPENDENT_AMBULATORY_CARE_PROVIDER_SITE_OTHER): Payer: Self-pay | Admitting: Internal Medicine

## 2017-12-09 NOTE — Telephone Encounter (Signed)
Called pt and explained that we do not manage PT/INR in our office that she has an order from [place] to have it checked. Pt said she would contact her PCP.   Hoy Finlay, RN  12/09/2017, 15:30            Pt left message asking for her coumadin results.Thanks.

## 2017-12-09 NOTE — Telephone Encounter (Signed)
-----   Message from Georgina Snell sent at 12/09/2017  2:05 PM EDT -----  Pt left message asking for her coumadin results.Thanks.

## 2017-12-10 ENCOUNTER — Telehealth (INDEPENDENT_AMBULATORY_CARE_PROVIDER_SITE_OTHER): Payer: Self-pay | Admitting: Nurse Practitioner

## 2017-12-10 ENCOUNTER — Other Ambulatory Visit (INDEPENDENT_AMBULATORY_CARE_PROVIDER_SITE_OTHER): Payer: Self-pay | Admitting: Nurse Practitioner

## 2017-12-10 DIAGNOSIS — Z7901 Long term (current) use of anticoagulants: Secondary | ICD-10-CM

## 2017-12-10 NOTE — Telephone Encounter (Signed)
INR from 11/27/17 low at 1.78, states she has has not been taking coumadin as ordered while she was on vacation, would forget to take the extra dose on Wednesday and Friday but has been taking as prescribed for last few weeks, notified to repeat INR early tomorrow AM and we would adjust dosage as necessary. Standing order faxed to [facility] Cameron Sprang, LPN  11/05/8401, 75:43

## 2017-12-11 ENCOUNTER — Telehealth (INDEPENDENT_AMBULATORY_CARE_PROVIDER_SITE_OTHER): Payer: Self-pay | Admitting: Nurse Practitioner

## 2017-12-11 NOTE — Telephone Encounter (Signed)
Notified that INR 1.86, continue current coumadin dosage and recheck in 1 week. Cameron Sprang, LPN  05/08/8286, 33:74

## 2017-12-17 MED ORDER — BUDESONIDE-FORMOTEROL FUMARATE 160-4.5 MCG/ACT IN AERO: 2.0000 | INHALATION_SPRAY | Freq: Two times a day (BID) | RESPIRATORY_TRACT | 0 refills | Status: DC

## 2017-12-18 ENCOUNTER — Encounter (INDEPENDENT_AMBULATORY_CARE_PROVIDER_SITE_OTHER): Payer: Self-pay

## 2017-12-18 ENCOUNTER — Other Ambulatory Visit (INDEPENDENT_AMBULATORY_CARE_PROVIDER_SITE_OTHER): Payer: Self-pay | Admitting: INTERNAL MEDICINE CARDIOVASCULAR DISEASE

## 2017-12-18 ENCOUNTER — Ambulatory Visit (INDEPENDENT_AMBULATORY_CARE_PROVIDER_SITE_OTHER): Payer: Medicare PPO | Admitting: INTERNAL MEDICINE CARDIOVASCULAR DISEASE

## 2017-12-18 DIAGNOSIS — I4891 Unspecified atrial fibrillation: Secondary | ICD-10-CM

## 2017-12-18 DIAGNOSIS — R0609 Other forms of dyspnea: Secondary | ICD-10-CM

## 2017-12-18 DIAGNOSIS — R079 Chest pain, unspecified: Secondary | ICD-10-CM

## 2017-12-18 MED ORDER — DILTIAZEM CD 360 MG CAPSULE,EXTENDED RELEASE 24 HR
360.0000 mg | ORAL_CAPSULE | Freq: Every day | ORAL | 4 refills | Status: DC
Start: 2017-12-18 — End: 2018-07-01

## 2017-12-18 NOTE — Nursing Note (Signed)
Pt did not have a med list with them at visit - went over list with patient   Advised patient to check AVS list with medicines at home and call if any discrepancies    -Confirmed correct pharmacy with patient for e-scribing   Youlanda Roys, LPN  7/51/0258, 52:77

## 2017-12-18 NOTE — Progress Notes (Signed)
[facility]  [address]  [place] [address]  Phone: [phone]  Fax: [phone]    Encounter Date: 12/18/2017    Patient ID:  Alexandra Nunez  SWF:[id number]    DOB: 01/19/1956  Age: 62 y.o. female    Subjective:     Chief Complaint   Patient presents with   . EKG   . Chest Pain    . Weakness   . Fatigue   . Shortness of Breath     62 year old with paroxysmal AFib patient came in to get an EKG done today as she thought she has gone back into AFib she is taking Cardizem 120 mg twice a day.  She has decided not to take the amiodarone. She underwent synchronized cardioversion at [facility] on 09/21/17 for her symptomatic atrial fibrillation and converted to normal sinus rhythm with 200J x1. Cardizem was reduced from 360mg  qam and 120mg  qpm to 360mg  qd, and she was started on Amiodarone 200mg  twice a day for one week that was then to be reduced to 200mg  once daily . A transesophageal echocardiogram was completed on 10/01/17 and showed preserved EF at 60%, no significant valvular abnormalities and adequate size of LA appendage with no smoke or thrombus. She remained in sinus rhythm at the time of her TEE. She is on thromboembolic prophylaxis with Coumadin with a goal INR between 2-3.       Chest Pain     This is a chronic problem. The current episode started more than 1 month ago. The onset quality is gradual. The problem occurs intermittently. The problem has been unchanged. The pain is present in the substernal region. The pain is at a severity of 2/10. The quality of the pain is described as dull. Associated symptoms include dizziness, malaise/fatigue, palpitations and shortness of breath. Pertinent negatives include no cough.   Fatigue   This is a chronic problem. The problem occurs constantly. Associated symptoms include chest pain and shortness of breath.   Shortness of Breath   This is a chronic problem. The problem occurs constantly. The problem has been gradually worsening. Associated  symptoms include chest pain and leg swelling.     Current Outpatient Medications   Medication Sig   . acetaminophen (TYLENOL 8 HOUR) 650 mg Oral Tablet Sustained Release Take 1,300 mg by mouth Twice daily   . albuterol sulfate (PROVENTIL OR VENTOLIN OR PROAIR) 90 mcg/actuation Inhalation HFA Aerosol Inhaler Take 2 Puffs by inhalation Every 6 hours as needed   . budesonide-formoterol (SYMBICORT) 160-4.5 mcg/actuation Inhalation HFA Aerosol Inhaler Take 2 Puffs by inhalation Twice daily   . buPROPion (WELLBUTRIN XL) 300 mg extended release 24 hr tablet Take 300 mg by mouth Once a day   . clonazePAM (KLONOPIN) 1 mg Oral Tablet Take 1 mg by mouth Twice daily   . Diltiazem (CARDIZEM CD) 360 mg Oral Capsule, Sust. Release 24 hr Take 1 Cap (360 mg total) by mouth Once a day   . furosemide (LASIX) 40 mg Oral Tablet TAKE 1 TABLET BY MOUTH EVERY DAY   . hydrocortisone 2.5 % Cream Apply topically Twice daily   . Ibuprofen (MOTRIN) 200 mg Oral Tablet Take 200 mg by mouth Three times a day   . losartan-hydrochlorothiazide (HYZAAR) 100-12.5 mg Oral Tablet Take 1 Tab by mouth Once a day   . melatonin 1 mg Oral Tablet Take 5 mg by mouth Every night    . methocarbamol (ROBAXIN) 500 mg Oral Tablet Take  500 mg by mouth Three times a day   . Warfarin (COUMADIN) 6 mg Oral Tablet Take 1 Tab (6 mg total) by mouth Every evening for 90 days 1 1/2 tablet on Wednesday and Friday and 1 tablet daily every other day.     Allergies   Allergen Reactions   . Prednisone      High doses   . Protonix [Pantoprazole]    . Sulfa (Sulfonamides)      Past Medical History:   Diagnosis Date   . Asthma    . Atrial fibrillation (CMS HCC)    . Borderline diabetes    . Chronic joint pain    . CKD (chronic kidney disease) stage 3, GFR 30-59 ml/min (CMS HCC)    . Essential hypertension    . GERD (gastroesophageal reflux disease)    . Knee pain, bilateral    . Obesity    . Osteoarthritis    . Uterine cancer (CMS [facility])          Past Surgical History:   Procedure  Laterality Date   . CARDIOVERSION     . COLONOSCOPY     . HX APPENDECTOMY     . HX DILATION AND CURETTAGE     . HX HYSTERECTOMY     . LAPAROSCOPIC TOTAL HYSTERECTOMY           Family Medical History:     Problem Relation (Age of Onset)    Coronary Artery Disease Mother, Father    HTN <20 y.o. Mother, Father            Social History     Tobacco Use   . Smoking status: Never Smoker   . Smokeless tobacco: Never Used   Substance Use Topics   . Alcohol use: Never     Frequency: Never   . Drug use: Never       Review of Systems   Constitutional: Positive for malaise/fatigue.   HENT: Negative for nosebleeds.    Eyes: Negative for visual disturbance.   Respiratory: Positive for shortness of breath. Negative for cough and chest tightness.    Cardiovascular: Positive for chest pain, palpitations and leg swelling.   Neurological: Positive for dizziness and light-headedness. Negative for syncope and speech difficulty.   Psychiatric/Behavioral: Negative for confusion.   All other systems reviewed and are negative.    Objective:   Vitals: BP 117/73   Pulse 72   Resp 18   Ht 1.626 m (5\' 4" )   Wt (!) 146.9 kg (323 lb 12.8 oz)   SpO2 92%   BMI 55.58 kg/m         Physical Exam   Constitutional: She is oriented to person, place, and time. She appears well-developed and well-nourished.   HENT:   Head: Normocephalic.   Eyes: Pupils are equal, round, and reactive to light.   Neck: Normal range of motion. No JVD present.   Cardiovascular: Normal rate, regular rhythm and normal heart sounds.   No murmur heard.  Pulmonary/Chest: Effort normal and breath sounds normal.   Abdominal: Soft. Bowel sounds are normal.   Musculoskeletal: Normal range of motion. She exhibits no edema or deformity.   Neurological: She is alert and oriented to person, place, and time. No cranial nerve deficit.   Skin: Skin is warm and dry.   Nursing note and vitals reviewed.      Assessment & Plan:     ENCOUNTER DIAGNOSES     ICD-10-CM   1. Morbid  obesity (CMS  HCC) E66.01   2. DOE (dyspnea on exertion) R06.09   3. Atrial fibrillation (CMS HCC) I48.91   4. Chest pain, unspecified type R07.9     Symptoms back with palpitation shortness of breath and chest pain in the setting of AFib RVR.  Today again increase the diltiazem 360 mg once a day.  If the blood pressure goes low patient was [place] to cut back on the ARB and HCTZ combination to half a pill.  Patient will be scheduled for TEE cardioversion was INR has dropped.  If the cardioversion is about 4-6 weeks and INR has been stable then she would not need a TEE.  For chest pain there is already an order for a Lexiscan nuclear that does not appear to have been completed will reorder the same.    Orders Placed This Encounter   . ECG - ADD ON IN CLINIC - SAME DAY   . LEXISCAN MPS SPECT/REST& STRESS/CARDIOLITE([place])   . Diltiazem (CARDIZEM CD) 360 mg Oral Capsule, Sust. Release 24 hr   This note may have been partially generated using MModal Fluency Direct system, and there may be some incorrect words, spellings, and punctuation that were not noted in checking the note before saving.      Return in about 6 weeks (around 01/29/2018).    Leo Grosser, MD

## 2017-12-21 ENCOUNTER — Ambulatory Visit (INDEPENDENT_AMBULATORY_CARE_PROVIDER_SITE_OTHER): Payer: Self-pay | Admitting: INTERNAL MEDICINE CARDIOVASCULAR DISEASE

## 2017-12-21 ENCOUNTER — Telehealth (INDEPENDENT_AMBULATORY_CARE_PROVIDER_SITE_OTHER): Payer: Self-pay | Admitting: Nurse Practitioner

## 2017-12-21 NOTE — Telephone Encounter (Signed)
Notified via phone INR good at 2.57, continue current dose of coumadin and recheck in 2 weeks. Cameron Sprang, LPN  10/22/5499, 58:68

## 2017-12-21 NOTE — Telephone Encounter (Signed)
Called to speak to the patient regarding the TEE/ Cardioversion. To inform of her pre admissions appointment.  She informed me that she has went back into NSR, and wanted to cancel everything. I gave her the contact number to call to cancel. No questions or concerns voiced. Dr. Deatra Canter made aware.   Youlanda Roys, LPN  09/24/5091, 26:71

## 2017-12-22 ENCOUNTER — Ambulatory Visit (HOSPITAL_BASED_OUTPATIENT_CLINIC_OR_DEPARTMENT_OTHER): Payer: Self-pay | Admitting: Sports Medicine

## 2017-12-23 ENCOUNTER — Ambulatory Visit (INDEPENDENT_AMBULATORY_CARE_PROVIDER_SITE_OTHER): Payer: Self-pay | Admitting: INTERNAL MEDICINE CARDIOVASCULAR DISEASE

## 2017-12-23 NOTE — Telephone Encounter (Addendum)
Re spoke to patient to clarify, She again verbalized to me that she does not want to have the [place] done. Dr. Deatra Canter is aware, from Monday on 12/21/17.   I encouraged patient to call  Our office if she needs anything. She verbalized understanding. No Questions.  Youlanda Roys, LPN  8/34/1962, 22:97              Youlanda Roys, LPN  [place], [place], I spoke to her on Monday regarding that. She informed me then that she did not want to have it done, i gave her [facility] contact number to call and cancel that. I put a note in on the 16th, Dr. Deatra Canter is aware   Thanks,  Youlanda Roys, LPN 9/89/2119, 41:74        I called pt to give her date and time of her mps that is scheduled on 01/13/18 at 7:45AM ordered by Dr Deatra Canter and she states that she is no longer in a-fib and doesn't think she needs the stress test. She is requesting a phone call from one of the nurses. Thanks! -[employer]

## 2017-12-30 ENCOUNTER — Other Ambulatory Visit (INDEPENDENT_AMBULATORY_CARE_PROVIDER_SITE_OTHER): Payer: Self-pay | Admitting: Student in an Organized Health Care Education/Training Program

## 2018-01-12 ENCOUNTER — Encounter (INDEPENDENT_AMBULATORY_CARE_PROVIDER_SITE_OTHER): Payer: Self-pay | Admitting: INTERNAL MEDICINE CARDIOVASCULAR DISEASE

## 2018-01-14 ENCOUNTER — Telehealth (INDEPENDENT_AMBULATORY_CARE_PROVIDER_SITE_OTHER): Payer: Self-pay | Admitting: Nurse Practitioner

## 2018-01-14 NOTE — Telephone Encounter (Addendum)
Left message to return call re: INR. Cameron Sprang, LPN  34/74/2595, 63:87    Left message #2 to return call. Cameron Sprang, LPN  56/43/3295, 18:84

## 2018-01-18 ENCOUNTER — Telehealth (INDEPENDENT_AMBULATORY_CARE_PROVIDER_SITE_OTHER): Payer: Self-pay | Admitting: Nurse Practitioner

## 2018-01-18 NOTE — Telephone Encounter (Signed)
I tried to call the pt to ask what dosage of coumadin she is taking and if she has missed any doses. Her INR was at 1.22 on 01/13/2018. There was no answer so I left a message for her to return my call.  Eleonore Chiquito, LPN  93/26/7124, 58:09

## 2018-01-18 NOTE — Telephone Encounter (Signed)
Pt notified. Voiced understanding. Stated she is aware of the risk of taking the ibuprofen but she is not allowed the pain medication she needs and the tylenol is not enough to cover her pain. She takes 1000 mg of tylenol and 400 mg of ibuprofen at the same time 2 times a day. She knows the risk but doesn't feel she has a choice.  Eleonore Chiquito, LPN  40/98/2867, 51:98

## 2018-01-18 NOTE — Telephone Encounter (Signed)
Pt returned my call. Stated her husband is doing well and should be released from the hospital today. She said she may have missed a couple of doses and was probably eating more greens than usual too. Also she has been taking ibuprofen for the pain in her knees because the doctors won't give her any pain medication. She is seeing Vernell Morgans for her knee pain and she won't give her medication, she wants to do injections.  Eleonore Chiquito, LPN  68/86/4847, 20:72

## 2018-01-22 ENCOUNTER — Ambulatory Visit: Payer: Medicare PPO | Attending: INTERNAL MEDICINE CARDIOVASCULAR DISEASE

## 2018-01-22 ENCOUNTER — Encounter (INDEPENDENT_AMBULATORY_CARE_PROVIDER_SITE_OTHER): Payer: Self-pay | Admitting: Surgical

## 2018-01-22 DIAGNOSIS — R531 Weakness: Secondary | ICD-10-CM | POA: Insufficient documentation

## 2018-01-22 DIAGNOSIS — Z7901 Long term (current) use of anticoagulants: Secondary | ICD-10-CM | POA: Insufficient documentation

## 2018-01-22 DIAGNOSIS — I4891 Unspecified atrial fibrillation: Secondary | ICD-10-CM | POA: Insufficient documentation

## 2018-01-22 DIAGNOSIS — R42 Dizziness and giddiness: Secondary | ICD-10-CM

## 2018-01-22 DIAGNOSIS — I480 Paroxysmal atrial fibrillation: Secondary | ICD-10-CM | POA: Insufficient documentation

## 2018-01-22 DIAGNOSIS — R001 Bradycardia, unspecified: Secondary | ICD-10-CM

## 2018-01-22 DIAGNOSIS — I10 Essential (primary) hypertension: Secondary | ICD-10-CM | POA: Insufficient documentation

## 2018-01-22 LAB — COMPREHENSIVE METABOLIC PANEL, NON-FASTING
ALBUMIN/GLOBULIN RATIO: 1.4 (ref 1.1–2.2)
ALBUMIN: 3.7 g/dL (ref 3.5–5.0)
ALKALINE PHOSPHATASE: 94 U/L (ref 38–126)
ALT (SGPT): 15 U/L (ref 14–54)
ANION GAP: 5 mmol/L (ref 5–15)
AST (SGOT): 18 U/L (ref 15–41)
BILIRUBIN TOTAL: 0.2 mg/dL — ABNORMAL LOW (ref 0.3–1.2)
BUN/CREA RATIO: 20 (ref 6–20)
BUN: 15 mg/dL (ref 8–26)
CALCIUM: 8.6 mg/dL — ABNORMAL LOW (ref 8.9–10.3)
CHLORIDE: 107 mmol/L (ref 101–111)
CO2 TOTAL: 29 mmol/L (ref 22–32)
CO2 TOTAL: 29 mmol/L (ref 22–32)
CREATININE: 0.74 mg/dL (ref 0.60–1.10)
ESTIMATED GFR: >60 mL/min/1.73mˆ2 (ref >60)
GLUCOSE: 105 mg/dL (ref 70–110)
GLUCOSE: 105 mg/dL (ref 70–110)
POTASSIUM: 4.1 mmol/L (ref 3.6–5.1)
PROTEIN TOTAL: 6.3 g/dL — ABNORMAL LOW (ref 6.4–8.3)
SODIUM: 141 mmol/L (ref 136–144)

## 2018-01-22 LAB — PT/INR
INR: 2.69 — ABNORMAL HIGH (ref 0.80–1.20)
PROTHROMBIN TIME: 30.7 s — ABNORMAL HIGH (ref 9.4–12.5)

## 2018-01-22 LAB — CBC WITH DIFF
BASOPHIL #: <0.1 x10ˆ3/uL (ref <=0.20)
BASOPHIL %: 1 %
EOSINOPHIL #: 0.43 x10ˆ3/uL (ref <=0.50)
EOSINOPHIL %: 7 %
HCT: 40 % (ref 34.8–46.0)
HGB: 13.1 g/dL (ref 11.5–16.0)
IMMATURE GRANULOCYTE #: <0.1 x10ˆ3/uL (ref <0.10)
IMMATURE GRANULOCYTE %: 1 % (ref 0–1)
LYMPHOCYTE #: 0.88 x10ˆ3/uL — ABNORMAL LOW (ref 1.00–4.80)
LYMPHOCYTE %: 15 %
LYMPHOCYTE %: 15 %
MCH: 29.9 pg (ref 26.0–32.0)
MCHC: 32.8 g/dL (ref 31.0–35.5)
MCV: 91.3 fL (ref 78.0–100.0)
MONOCYTE #: 0.33 x10ˆ3/uL (ref 0.20–1.10)
MONOCYTE %: 5 %
MPV: 9.5 fL (ref 8.7–12.5)
NEUTROPHIL #: 4.35 10*3/uL (ref 1.50–7.70)
NEUTROPHIL %: 71 %
PLATELETS: 248 x10ˆ3/uL (ref 150–400)
RBC: 4.38 x10ˆ6/uL (ref 3.85–5.22)
RDW-CV: 13.2 % (ref 11.5–15.5)
WBC: 6.1 x10ˆ3/uL (ref 3.7–11.0)

## 2018-01-27 ENCOUNTER — Encounter (INDEPENDENT_AMBULATORY_CARE_PROVIDER_SITE_OTHER): Payer: Self-pay

## 2018-01-27 ENCOUNTER — Other Ambulatory Visit (INDEPENDENT_AMBULATORY_CARE_PROVIDER_SITE_OTHER): Payer: Self-pay | Admitting: Medical

## 2018-01-27 ENCOUNTER — Ambulatory Visit (INDEPENDENT_AMBULATORY_CARE_PROVIDER_SITE_OTHER): Payer: Medicare PPO

## 2018-01-27 VITALS — BP 145/77 | HR 63 | Resp 18 | Ht 64.0 in | Wt 329.6 lb

## 2018-01-27 DIAGNOSIS — I480 Paroxysmal atrial fibrillation: Secondary | ICD-10-CM

## 2018-01-27 NOTE — Nursing Note (Signed)
Meds checked with patients list and confirmed correct pharmacy with patient for e-scribing.   Youlanda Roys, LPN  77/02/6578, 03:83

## 2018-02-02 ENCOUNTER — Ambulatory Visit (INDEPENDENT_AMBULATORY_CARE_PROVIDER_SITE_OTHER): Payer: Self-pay | Admitting: Nurse Practitioner

## 2018-02-02 NOTE — Telephone Encounter (Signed)
Patient has appt 02/16/18 for wellness, can do face to face then. Explained to patient, voiced understanding.

## 2018-02-02 NOTE — Telephone Encounter (Signed)
-----   Message from Gavin Potters, APRN sent at 02/02/2018  1:10 PM EDT -----  ----- Message from [place] sent at 02/02/2018  9:09 AM EDT -----  REQUESTING ORDER FOR AN  ELECTRICAL  WHEELCHAIR,  WILL PICK UP,  A HOME HEALTH  EVALUATOR WILL BE GOING OUT [place],

## 2018-02-03 ENCOUNTER — Ambulatory Visit: Payer: Medicare PPO | Attending: Nurse Practitioner

## 2018-02-03 ENCOUNTER — Ambulatory Visit (INDEPENDENT_AMBULATORY_CARE_PROVIDER_SITE_OTHER): Payer: Medicare PPO

## 2018-02-03 DIAGNOSIS — I4891 Unspecified atrial fibrillation: Secondary | ICD-10-CM

## 2018-02-03 DIAGNOSIS — Z23 Encounter for immunization: Secondary | ICD-10-CM

## 2018-02-03 LAB — PT/INR
INR: 1.71 — ABNORMAL HIGH (ref 0.80–1.20)
PROTHROMBIN TIME: 19.4 s — ABNORMAL HIGH (ref 9.4–12.5)

## 2018-02-08 MED ORDER — BUDESONIDE-FORMOTEROL FUMARATE 160-4.5 MCG/ACT IN AERO: 2.0000 | INHALATION_SPRAY | Freq: Two times a day (BID) | RESPIRATORY_TRACT | 0 refills | Status: DC

## 2018-02-11 ENCOUNTER — Encounter (INDEPENDENT_AMBULATORY_CARE_PROVIDER_SITE_OTHER): Payer: Self-pay | Admitting: Nurse Practitioner

## 2018-02-11 ENCOUNTER — Other Ambulatory Visit (INDEPENDENT_AMBULATORY_CARE_PROVIDER_SITE_OTHER): Payer: Self-pay | Admitting: INTERNAL MEDICINE CARDIOVASCULAR DISEASE

## 2018-02-11 MED ORDER — HYDROCHLOROTHIAZIDE 12.5 MG CAPSULE
12.5000 mg | ORAL_CAPSULE | Freq: Every day | ORAL | 3 refills | Status: DC
Start: 2018-02-11 — End: 2018-05-03

## 2018-02-11 MED ORDER — LOSARTAN 100 MG TABLET
100.0000 mg | ORAL_TABLET | Freq: Every day | ORAL | 3 refills | Status: DC
Start: 2018-02-11 — End: 2018-04-23

## 2018-02-16 ENCOUNTER — Encounter (INDEPENDENT_AMBULATORY_CARE_PROVIDER_SITE_OTHER): Payer: Self-pay | Admitting: Internal Medicine

## 2018-02-16 ENCOUNTER — Other Ambulatory Visit (INDEPENDENT_AMBULATORY_CARE_PROVIDER_SITE_OTHER): Payer: Self-pay | Admitting: Surgical

## 2018-02-16 ENCOUNTER — Ambulatory Visit (INDEPENDENT_AMBULATORY_CARE_PROVIDER_SITE_OTHER): Payer: Medicare PPO

## 2018-02-16 ENCOUNTER — Encounter (INDEPENDENT_AMBULATORY_CARE_PROVIDER_SITE_OTHER): Payer: Self-pay | Admitting: Surgical

## 2018-02-16 ENCOUNTER — Ambulatory Visit: Payer: Medicare PPO | Attending: Surgical | Admitting: Surgical

## 2018-02-16 VITALS — BP 130/80 | HR 57 | Temp 98.0°F | Resp 16 | Ht 64.0 in | Wt 326.0 lb

## 2018-02-16 DIAGNOSIS — E785 Hyperlipidemia, unspecified: Secondary | ICD-10-CM

## 2018-02-16 DIAGNOSIS — I4891 Unspecified atrial fibrillation: Secondary | ICD-10-CM

## 2018-02-16 DIAGNOSIS — Z6841 Body Mass Index (BMI) 40.0 and over, adult: Secondary | ICD-10-CM | POA: Insufficient documentation

## 2018-02-16 DIAGNOSIS — R7309 Other abnormal glucose: Secondary | ICD-10-CM

## 2018-02-16 DIAGNOSIS — Z1231 Encounter for screening mammogram for malignant neoplasm of breast: Secondary | ICD-10-CM

## 2018-02-16 DIAGNOSIS — Z0000 Encounter for general adult medical examination without abnormal findings: Secondary | ICD-10-CM

## 2018-02-16 DIAGNOSIS — J45909 Unspecified asthma, uncomplicated: Secondary | ICD-10-CM | POA: Insufficient documentation

## 2018-02-16 DIAGNOSIS — C55 Malignant neoplasm of uterus, part unspecified: Secondary | ICD-10-CM

## 2018-02-16 DIAGNOSIS — M1990 Unspecified osteoarthritis, unspecified site: Secondary | ICD-10-CM | POA: Insufficient documentation

## 2018-02-16 DIAGNOSIS — Z8542 Personal history of malignant neoplasm of other parts of uterus: Secondary | ICD-10-CM | POA: Insufficient documentation

## 2018-02-16 LAB — CBC WITH DIFF
BASOPHIL #: <0.1 x10ˆ3/uL (ref <=0.20)
BASOPHIL %: 1 %
EOSINOPHIL #: 0.39 x10ˆ3/uL (ref <=0.50)
EOSINOPHIL %: 8 %
HCT: 41.4 % (ref 34.8–46.0)
HGB: 13.5 g/dL (ref 11.5–16.0)
IMMATURE GRANULOCYTE #: <0.1 x10ˆ3/uL (ref <0.10)
IMMATURE GRANULOCYTE %: 0 % (ref 0–1)
LYMPHOCYTE #: 1.05 x10?3/uL (ref 1.00–4.80)
LYMPHOCYTE #: 1.05 x10ˆ3/uL (ref 1.00–4.80)
LYMPHOCYTE %: 21 %
MCH: 29.8 pg (ref 26.0–32.0)
MCHC: 32.6 g/dL (ref 31.0–35.5)
MCV: 91.4 fL (ref 78.0–100.0)
MONOCYTE #: 0.38 x10ˆ3/uL (ref 0.20–1.10)
MONOCYTE %: 7 %
MONOCYTE %: 7 %
MPV: 9.7 fL (ref 8.7–12.5)
NEUTROPHIL #: 3.24 x10ˆ3/uL (ref 1.50–7.70)
NEUTROPHIL %: 63 %
PLATELETS: 247 x10ˆ3/uL (ref 150–400)
RBC: 4.53 x10ˆ6/uL (ref 3.85–5.22)
RDW-CV: 12.5 % (ref 11.5–15.5)
WBC: 5.1 x10ˆ3/uL (ref 3.7–11.0)

## 2018-02-16 LAB — COMPREHENSIVE METABOLIC PNL, FASTING
ALBUMIN/GLOBULIN RATIO: 1.5 (ref 1.1–2.2)
ALBUMIN: 3.7 g/dL (ref 3.5–5.0)
ALKALINE PHOSPHATASE: 95 U/L (ref 38–126)
ALT (SGPT): 15 U/L (ref 14–54)
ALT (SGPT): 15 U/L (ref 14–54)
ANION GAP: 11 mmol/L (ref 5–15)
AST (SGOT): 16 U/L (ref 15–41)
BILIRUBIN TOTAL: 0.6 mg/dL (ref 0.3–1.2)
BUN/CREA RATIO: 21 — ABNORMAL HIGH (ref 6–20)
BUN: 17 mg/dL (ref 8–26)
CALCIUM: 8.8 mg/dL — ABNORMAL LOW (ref 8.9–10.3)
CHLORIDE: 102 mmol/L (ref 101–111)
CO2 TOTAL: 30 mmol/L (ref 22–32)
CREATININE: 0.82 mg/dL (ref 0.60–1.10)
ESTIMATED GFR: >60 mL/min/1.73mˆ2 (ref >60)
GLUCOSE: 113 mg/dL — ABNORMAL HIGH (ref 70–110)
POTASSIUM: 4.1 mmol/L (ref 3.6–5.1)
PROTEIN TOTAL: 6.2 g/dL — ABNORMAL LOW (ref 6.4–8.3)
SODIUM: 143 mmol/L (ref 136–144)
SODIUM: 143 mmol/L (ref 136–144)

## 2018-02-16 LAB — PT/INR
INR: 3.37 — ABNORMAL HIGH (ref 0.80–1.20)
PROTHROMBIN TIME: 38.7 s — ABNORMAL HIGH (ref 9.4–12.5)

## 2018-02-16 LAB — LIPID PANEL
CHOL/HDL RATIO: 4.5 — ABNORMAL HIGH (ref 0.0–4.4)
CHOLESTEROL: 194 mg/dL (ref 0–199)
HDL CHOL: 43 mg/dL (ref 38–85)
LDL CALC: 119 mg/dL — ABNORMAL HIGH (ref 0–100)
TRIGLYCERIDES: 160 mg/dL — ABNORMAL HIGH (ref 0–149)
VLDL CALC: 32 mg/dL (ref 6–49)

## 2018-02-16 LAB — HGA1C (HEMOGLOBIN A1C WITH EST AVG GLUCOSE)
ESTIMATED AVERAGE GLUCOSE: 117 mg/dL
HEMOGLOBIN A1C: 5.7 % (ref 4.3–6.1)

## 2018-02-16 LAB — THYROID STIMULATING HORMONE WITH FREE T4 REFLEX: TSH: 1.808 u[IU]/mL (ref 0.450–5.330)

## 2018-02-16 MED ORDER — VARICELLA-ZOSTER GLYCOE VACC-AS01B ADJ(PF) 50 MCG/0.5 ML IM SUSP, KIT
0.5000 mL | INHALATION_SUSPENSION | Freq: Once | INTRAMUSCULAR | 1 refills | Status: AC
Start: 2018-02-16 — End: 2018-02-16

## 2018-02-16 NOTE — Progress Notes (Addendum)
INTERNAL MEDICINE, PHYSICIANS OFFICE CENTER  [facility]  [place] [address]    Medicare Annual Wellness Visit    Name: Alexandra Nunez MRN:  [id number]   Date: 02/16/2018 Age: 62 y.o.       SUBJECTIVE:   Alexandra Nunez is a 62 y.o. female for presenting for Medicare Wellness exam. Patient recently moved to the area from [facility]. She continues to follow with a few specialists in [place]- Pulmonary for asthma and gynecology for uterine cancer (total hysterectomy 2016- continues to receive yearly PAP). Patient is follow with [facility] for A. Fib and Hyerlidemia. She is going to speak with cardiologist about switching from Coumadin to Eliquis. She has also seen A. Carpenter through Pain clinic about her chronic osteoarthritis of her knees. She was previously on Hydrocodone therapy in [place]. She is wanting Tramadol.     Patient would also like order for wheelchair. She has difficulty ambulating and will be using it for assistance to and from her appointments as well as within her home. Patient is unable to use cane or walker as I feel this may increase her risk of falling. She states that due to chronic knee pain she is at increased risks for falls.       I have reviewed and reconciled the medication list with the patient today.    I have reviewed and updated as appropriate the past medical, family and social history. 02/16/2018 as summarized below:  Past Medical History:   Diagnosis Date   . Asthma    . Atrial fibrillation (CMS HCC)    . Borderline diabetes    . Chronic joint pain    . CKD (chronic kidney disease) stage 3, GFR 30-59 ml/min (CMS HCC)    . Essential hypertension    . GERD (gastroesophageal reflux disease)    . Knee pain, bilateral    . Obesity    . Osteoarthritis    . Uterine cancer (CMS [facility])      Past Surgical History:   Procedure Laterality Date   . Cardioversion     . Colonoscopy     . Hx appendectomy     . Hx dilation and curettage     . Hx hysterectomy     . Laparoscopic total  hysterectomy       Current Outpatient Medications   Medication Sig   . acetaminophen (TYLENOL 8 HOUR) 650 mg Oral Tablet Sustained Release Take 1,300 mg by mouth Twice daily   . albuterol sulfate (PROVENTIL OR VENTOLIN OR PROAIR) 90 mcg/actuation Inhalation HFA Aerosol Inhaler Take 2 Puffs by inhalation Every 6 hours as needed   . budesonide-formoterol (SYMBICORT) 160-4.5 mcg/actuation Inhalation HFA Aerosol Inhaler Take 2 Puffs by inhalation Twice daily   . buPROPion (WELLBUTRIN XL) 300 mg extended release 24 hr tablet Take 300 mg by mouth Once a day   . clonazePAM (KLONOPIN) 1 mg Oral Tablet Take 1 mg by mouth Twice daily   . Diltiazem (CARDIZEM CD) 360 mg Oral Capsule, Sust. Release 24 hr Take 1 Cap (360 mg total) by mouth Once a day   . furosemide (LASIX) 40 mg Oral Tablet TAKE 1 TABLET BY MOUTH EVERY DAY   . hydroCHLOROthiazide (MICROZIDE) 12.5 mg Oral Capsule Take 1 Cap (12.5 mg total) by mouth Once a day   . hydrocortisone 2.5 % Cream Apply topically Twice daily   . Ibuprofen (MOTRIN) 200 mg Oral Tablet Take 200 mg by mouth Three times a day   .  losartan (COZAAR) 100 mg Oral Tablet Take 1 Tab (100 mg total) by mouth Once a day   . melatonin 1 mg Oral Tablet Take 5 mg by mouth Every night    . methocarbamol (ROBAXIN) 500 mg Oral Tablet Take 500 mg by mouth Three times a day   . predniSONE (DELTASONE) 10 mg Oral Tablet Take 10 mg by mouth Once a day   . varicella-zoster, PF, (SHINGRIX, PF,) 50 mcg/0.5 mL Intramuscular Suspension for Reconstitution 0.5 mL by Intramuscular route One time for 1 dose   . Warfarin (COUMADIN) 6 mg Oral Tablet Take 1 Tab (6 mg total) by mouth Every evening for 90 days 1 1/2 tablet on Wednesday and Friday and 1 tablet daily every other day.     Family Medical History:     Problem Relation (Age of Onset)    Coronary Artery Disease Mother, Father    HTN <20 y.o. Mother, Father            Social History     Socioeconomic History   . Marital status: Married     Spouse name: Not on file   .  Number of children: Not on file   . Years of education: Not on file   . Highest education level: Not on file   Tobacco Use   . Smoking status: Never Smoker   . Smokeless tobacco: Never Used   Substance and Sexual Activity   . Alcohol use: Never     Frequency: Never   . Drug use: Never     Review of Systems: Pertinent items are noted in HPI.     List of Current Health Care Providers   Care Team     PCP     Name Type Specialty Phone Number    Gavin Potters, APRN Nurse Practitioner NURSE PRACTITIONER [phone]          Care Team     No care team found                  Health Maintenance   Topic Date Due   . HIV Screening  04/26/1971   . Adult Tdap-Td (1 - Tdap) 04/26/1975   . Pap smear  04/25/1977   . Hepatitis C screening antibody  04/25/2001   . Mammography  04/25/2006   . Shingles Vaccine (1 of 2) 04/25/2006   . Colonoscopy  04/25/2006   . Annual Wellness Exam  07/06/2017   . Depression Screening  07/07/2018   . Influenza Vaccine  Completed     Medicare Wellness Assessment   Medicare initial or wellness physical in the last year?: No  Advance Directives (optional)   Does patient have a living will or MPOA: no   Has patient provided [employer] with a copy?: no   Advance directive information given to the patient today?: no      Activities of Daily Living   Do you need help with dressing, bathing, or walking?: Yes   Do you need help with shopping, housekeeping, medications, or finances?: Yes   Do you have rugs in hallways, broken steps, or poor lighting?: No   Do you have grab bars in your bathroom, non-slip strips in your tub, and hand rails on your stairs?: Yes   Urinary Incontinence Screen (Women >=65 only)       Cognitive Function Screen (1=Yes, 0=No)   What is you age?: Correct   What is the time to the nearest hour?: Correct   What  is the year?: Correct   What is the name of this clinic?: Correct   Can the patient recognize two persons (the doctor, the nurse, home help, etc.)?: Correct   What is the date  of your birth? (day and month sufficient) : Correct   In what year did World War II end?: Correct   Who is the current president of the [place]?: Correct   Count from 20 down to 1?: Correct   What address did I give you earlier?: Correct   Total Score: 10       Hearing Screen   Have you noticed any hearing difficulties?: No  After whispering 9-1-6 how many numbers did the patient repeat correctly?: 3   Fall Risk Screen   Do you feel unsteady when standing or walking?: Yes  Do you worry about falling?: Yes  Have you fallen in the past year?: No   Vision Screen           Depression Screen   Little interest or pleasure in doing things.: More than half the days  Feeling down, depressed, or hopeless: More than half the days  PHQ 2 Total: 4  Trouble falling or staying asleep, or sleeping too much.: Nearly every day  Feeling tired or having little energy: Nearly every day  Poor appetite or overeating: Nearly every day  Feeling bad about yourself/ that you are a failure in the past 2 weeks?: Not at all  Trouble concentrating on things in the past 2 weeks?: Nearly every day  Moving/Speaking slowly or being fidgety or restless  in the past 2 weeks?: More than half the days  Thoughts that you would be better off DEAD, or of hurting yourself in some way.: Not at all  PHQ 9 Total: 18        OBJECTIVE:   BP 130/80   Pulse 57   Temp 36.7 C (98 F)   Resp 16   Ht 1.626 m (5\' 4" )   Wt (!) 147.9 kg (326 lb)   SpO2 96%   BMI 55.96 kg/m        Other appropriate exam:  Physical Exam   Constitutional: She is oriented to person, place, and time and well-developed, well-nourished, and in no distress.   HENT:   Head: Normocephalic and atraumatic.   Right Ear: External ear normal.   Left Ear: External ear normal.   Mouth/Throat: Oropharynx is clear and moist.   Eyes: Pupils are equal, round, and reactive to light. Conjunctivae and EOM are normal.   Neck: Normal range of motion. Neck supple.   Cardiovascular: Normal rate and  regular rhythm.   Pulmonary/Chest: Effort normal and breath sounds normal. No respiratory distress. She has no wheezes.   Abdominal: Soft.   Musculoskeletal:   Active ROM of the upper extremities.    Neurological: She is alert and oriented to person, place, and time.   Skin: Skin is warm and dry.   Psychiatric: Mood, memory, affect and judgment normal.         Health Maintenance Due   Topic Date Due   . HIV Screening  04/26/1971   . Adult Tdap-Td (1 - Tdap) 04/26/1975   . Pap smear  04/25/1977   . Hepatitis C screening antibody  04/25/2001   . Mammography  04/25/2006   . Shingles Vaccine (1 of 2) 04/25/2006   . Colonoscopy  04/25/2006   . Annual Wellness Exam  07/06/2017      ASSESSMENT & PLAN:  Identified Risk Factors/ Recommended Actions     ICD-10-CM    1. Encounter for Medicare annual wellness exam Z00.00 Health maintenance: Patient receives yearly PAP (due to uterine cancer) through gynecology in [place]. She had a screening colonoscopy in 2017 in [place] (requested report for [facility])- she will be due again in 2022 due to family history of colon cancer or sooner if any problems. She states she is up to date on Tdap and has received Pneumovax 23. She also received influenza vaccine for 2019. She was given order for Shingrix. Routine labs have also been ordered.                   2. Atrial fibrillation, unspecified type (CMS HCC) I48.91 Order placed for PT/INR. We discussed need for compliance with Coumadin and monitoring. She was advised to follow up with cardiology as scheduled and may discuss with specialists about medication changes.                        3. Hyperlipidemia, unspecified hyperlipidemia type E78.5 Order placed for Lipid panel.    4. Malignant neoplasm of uterus, unspecified site (CMS Uncertain) C55 Advised to continue to follow with gynecology as scheduled- offered referral to specialists in the area. She declined and would continue to follow with specialists in [place].    5. Asthma, unspecified asthma severity,  unspecified whether complicated, unspecified whether persistent J45.909 Advised to continue to follow with pulmonary as scheduled- offered referral to specialists in the area. She declined and would continue to follow with specialists in [place].    6. Osteoarthritis, unspecified osteoarthritis type, unspecified site M19.90 Explained to patient that I would not write for her to receive Tramadol for her chronic knee pain. I offered referral to another specialists. She asked for appointment with Dr. Glennon Mac   7. Screening mammogram, encounter for Z12.31 Order placed.        Order was also written for patient to have manual wheelchair.     Fall Risk Follow up plan of care: Footwear and potential problems addressed      Orders Placed This Encounter   . CBC/Diff   . Comprehensive Metabolic Panel (Fasting)   . Lipid Panel   . TSH with Free T4 Reflex   . PT/INR   . varicella-zoster, PF, (SHINGRIX, PF,) 50 mcg/0.5 mL Intramuscular Suspension for Reconstitution          The patient has been educated about risk factors and recommended preventive care. Written Prevention Plan completed/ updated and given to patient (see After Visit Summary).    Return in about 6 months (around 08/17/2018).     Patient was seen independently with co-signing physician available for consult.     Lyndel Safe, PA-C  [facility] [place], PHYSICI[facility] OFFICE [facility]ENTER  [address]  [place] [address]    I agree with above plan. Any exceptions are noted above.    Yolanda Bonine, DO

## 2018-02-16 NOTE — Nursing Note (Signed)
02/16/18 0800   Medicare Wellness Assessment   Medicare initial or wellness physical in the last year? No   Advance Directives   Does patient have a living will or MPOA no   Has patient provided [employer] with a copy? no   Advance directive information given to the patient today? no   Activities of Daily Living   Do you need help with dressing, bathing, or walking? Yes   Do you need help with shopping, housekeeping, medications, or finances? Yes   Do you have rugs in hallways, broken steps, or poor lighting? No   Do you have grab bars in your bathroom, non-slip strips in your tub, and hand rails on your stairs? Yes   Cognitive Function Screen   What is you age? 1   What is the time to the nearest hour? 1   What is the year? 1   What is the name of this clinic? 1   Can the patient recognize two persons (the doctor, the nurse, home help, etc.)? 1   What is the date of your birth? (day and month sufficient)  1   In what year did World War II end? 1   Who is the current president of the [place]? 1   Count from 20 down to 1? 1   What address did I give you earlier? 1   Total Score 10   Depression Screen   Little interest or pleasure in doing things. 2   Feeling down, depressed, or hopeless 2   PHQ 2 Total 4   Trouble falling or staying asleep, or sleeping too much. 3   Feeling tired or having little energy 3   Poor appetite or overeating 3   Feeling bad about yourself/ that you are a failure in the past 2 weeks? 0   Trouble concentrating on things in the past 2 weeks? 3   Moving/Speaking slowly or being fidgety or restless  in the past 2 weeks? 2   Thoughts that you would be better off DEAD, or of hurting yourself in some way. 0   If you checked off any problems, how difficult have these problems made it for you to do your work, take care of things at home, or get along with other people? Very difficult   PHQ 9 Total 18   Hearing Screen   Have you noticed any hearing difficulties? No   After whispering  9-1-6 how many numbers did the patient repeat correctly? 3   Fall Risk Assessment   Do you feel unsteady when standing or walking? Yes   Do you worry about falling? Yes   Have you fallen in the past year? No

## 2018-02-16 NOTE — Patient Instructions (Signed)
Medicare Preventive Services  Medicare coverage information Recommendation for YOU   Heart Disease and Diabetes   Lipid profile every 5 years or more often if at risk for cardiovascular disease     Diabetes Screening  yearly for those at risk for diabetes, 2 tests per year for those with prediabetes Last Glucose: 105    Diabetes Self Management Training or Medical Nutrition Therapy  for those with diabetes, up to 10 hrs initial training within a year, subsequent years up to 2 hrs of follow up training Optional for those with diabetes     Medical Nutrition Therapy three hours of one-on-one counseling in first year, two hours in subsequent years Optional for those with diabetes, kidney disease   Intensive Behavioral Therapy for Obesity  Face-to-face counseling, first month every week, month 2-6 every other week, month 7-12 every month if continued progress is documented Optional for those with Body Mass Index 30 or higher  Your Body mass index is 55.96 kg/m.   Tobacco Cessation (Quitting) Counseling   two attempts per year, max 4 sessions per attempt, up to 8 per year, for those with tobacco-related health condition Optional for those that use tobacco   Cancer Screening   Colorectal screening   for anyone age 28 to 73 or any age if high risk:  . Screening Colonoscopy every 10 yrs if low risk,  more frequent if higher risk  OR  . Flexible  Sigmoidoscopy  every 5 yr OR  . Fecal Occult Blood Testing yearly OR  . Cologuard Stool DNA test once every 3 years OR  . CT Colonography every 5 yrs    See your schedule below   Screening Pap Test recommended every 3 years for all women age 74 to 37, or every five years if combined with HPV test (routine screening not needed after total hysterectomy).  Medicare covers every 2 years, up to yearly if high risk.  Screening Pelvic Exam Medicare covers every 2 years, yearly if high risk or childbearing age with abnormal Pap in last 3 yrs. See your schedule below   Screening Mammogram      Recommended every 1-2 years for women age 32 to 26, and selectively recommended for women between 33-49 based on shared decisions about risk. Covered by Medicare up to every year for women age 32 or older See your schedule below   Lung Cancer Screening  Annual low dose computed tomography (LDCT scan) is recommended for those age 19-77 who smoked 30 pack-years and are current smokers or quit smoking within past 15 years (one pack-year= smoking one PPD for one year), after counseling by your doctor or nurse clinician about the possible benefits or harms See your schedule below   Vaccinations   Pneumococcal Vaccine recommended routinely age 58+ with two separate vaccines one year apart (Prevnar then Pneumovax).  Recommended before age 12 if medical conditions increase risk  Seasonal Influenza Vaccine once every flu season   Hepatitis B Vaccine 3 doses if risk (including anyone with diabetes or liver disease)  Shingles Vaccine Once or twice at age 30 or older  Diphtheria Tetanus Pertussis Vaccine ONCE as adult, booster every 10 years     Immunization History   Administered Date(s) Administered   . Influenza Vaccine IM (ADMIN) 02/03/2018     Shingles vaccine and Diphtheria Tetanus Pertussis vaccines are available at pharmacies or local health department without a prescription.   Other Screening   Bone Densitometry   every 24 months for  anyone at risk, including postmenopausal       Glaucoma Screening   yearly if in high risk group such as diabetes, family history, African American age 8+ or Hispanic American age 80+      Hepatitis C Screening recommended ONCE for those born between 1945-1965, or high risk for HCV infection     HIV Testing   yearly or up to 3 times in pregnancy     Abdominal Aortic Aneurysm Screening Ultrasound   once with Initial Welcome to Medicare Physical within 12 months of coverage if  77 + with family history of AAA       Your Personalized Schedule for Preventive Tests   Health Maintenance:  Pending and Last Completed       Date Due Completion Date    HIV Screening 04/26/1971 ---    Adult Tdap-Td (1 - Tdap) 04/26/1975 ---    Pap smear 04/25/1977 ---    Hepatitis C screening antibody 04/25/2001 ---    Mammography 04/25/2006 ---    Shingles Vaccine (1 of 2) 04/25/2006 ---    Colonoscopy 04/25/2006 ---    Annual Wellness Exam 07/06/2017 ---    Depression Screening 07/07/2018 07/06/2017

## 2018-02-18 ENCOUNTER — Telehealth (INDEPENDENT_AMBULATORY_CARE_PROVIDER_SITE_OTHER): Payer: Self-pay | Admitting: Nurse Practitioner

## 2018-02-18 NOTE — Telephone Encounter (Signed)
Heather at [facility] states they got order for Wheelchair. Will need notes addended to state that [facility] and walker are not appropriate.

## 2018-02-24 ENCOUNTER — Ambulatory Visit: Payer: Medicare PPO | Attending: Nurse Practitioner

## 2018-02-24 ENCOUNTER — Other Ambulatory Visit: Payer: Self-pay

## 2018-02-24 DIAGNOSIS — I4891 Unspecified atrial fibrillation: Secondary | ICD-10-CM

## 2018-02-24 DIAGNOSIS — Z7901 Long term (current) use of anticoagulants: Secondary | ICD-10-CM

## 2018-02-24 LAB — PT/INR
INR: 1.58 — ABNORMAL HIGH (ref 0.80–1.20)
PROTHROMBIN TIME: 17.9 s — ABNORMAL HIGH (ref 9.4–12.5)

## 2018-02-25 ENCOUNTER — Encounter (INDEPENDENT_AMBULATORY_CARE_PROVIDER_SITE_OTHER): Payer: Self-pay | Admitting: Nurse Practitioner

## 2018-02-25 ENCOUNTER — Encounter (INDEPENDENT_AMBULATORY_CARE_PROVIDER_SITE_OTHER): Payer: Self-pay | Admitting: Surgical

## 2018-03-09 ENCOUNTER — Encounter (INDEPENDENT_AMBULATORY_CARE_PROVIDER_SITE_OTHER): Payer: Self-pay | Admitting: INTERNAL MEDICINE CARDIOVASCULAR DISEASE

## 2018-03-10 ENCOUNTER — Telehealth (INDEPENDENT_AMBULATORY_CARE_PROVIDER_SITE_OTHER): Payer: Self-pay | Admitting: Family Medicine

## 2018-03-10 ENCOUNTER — Ambulatory Visit
Admission: RE | Admit: 2018-03-10 | Discharge: 2018-03-10 | Disposition: A | Discharge status: Home / Self Care | Payer: Medicare PPO | Source: Ambulatory Visit | Attending: Family Medicine | Admitting: Family Medicine

## 2018-03-10 ENCOUNTER — Encounter (INDEPENDENT_AMBULATORY_CARE_PROVIDER_SITE_OTHER): Payer: Self-pay | Admitting: INTERNAL MEDICINE CARDIOVASCULAR DISEASE

## 2018-03-10 ENCOUNTER — Ambulatory Visit (INDEPENDENT_AMBULATORY_CARE_PROVIDER_SITE_OTHER): Payer: Medicare PPO | Admitting: INTERNAL MEDICINE CARDIOVASCULAR DISEASE

## 2018-03-10 ENCOUNTER — Encounter (INDEPENDENT_AMBULATORY_CARE_PROVIDER_SITE_OTHER): Payer: Self-pay | Admitting: Family Medicine

## 2018-03-10 ENCOUNTER — Ambulatory Visit (INDEPENDENT_AMBULATORY_CARE_PROVIDER_SITE_OTHER): Payer: Medicare PPO | Admitting: Family Medicine

## 2018-03-10 VITALS — BP 120/80 | HR 109 | Temp 98.1°F | Resp 16

## 2018-03-10 VITALS — BP 105/65 | HR 68 | Resp 18

## 2018-03-10 DIAGNOSIS — N183 Chronic kidney disease, stage 3 unspecified (CMS HCC): Secondary | ICD-10-CM

## 2018-03-10 DIAGNOSIS — M25562 Pain in left knee: Secondary | ICD-10-CM | POA: Insufficient documentation

## 2018-03-10 DIAGNOSIS — I4891 Unspecified atrial fibrillation: Secondary | ICD-10-CM

## 2018-03-10 DIAGNOSIS — I10 Essential (primary) hypertension: Secondary | ICD-10-CM | POA: Insufficient documentation

## 2018-03-10 DIAGNOSIS — M1990 Unspecified osteoarthritis, unspecified site: Secondary | ICD-10-CM

## 2018-03-10 DIAGNOSIS — G8929 Other chronic pain: Secondary | ICD-10-CM | POA: Insufficient documentation

## 2018-03-10 DIAGNOSIS — W19XXXA Unspecified fall, initial encounter: Principal | ICD-10-CM

## 2018-03-10 DIAGNOSIS — E785 Hyperlipidemia, unspecified: Secondary | ICD-10-CM | POA: Insufficient documentation

## 2018-03-10 DIAGNOSIS — I480 Paroxysmal atrial fibrillation: Secondary | ICD-10-CM

## 2018-03-10 DIAGNOSIS — M25561 Pain in right knee: Secondary | ICD-10-CM | POA: Insufficient documentation

## 2018-03-10 DIAGNOSIS — J45909 Unspecified asthma, uncomplicated: Secondary | ICD-10-CM

## 2018-03-10 DIAGNOSIS — R42 Dizziness and giddiness: Secondary | ICD-10-CM

## 2018-03-10 MED ORDER — DICLOFENAC 1 % TOPICAL GEL
Freq: Three times a day (TID) | CUTANEOUS | 0 refills | Status: DC
Start: 2018-03-10 — End: 2018-05-03

## 2018-03-10 NOTE — Progress Notes (Addendum)
[place]  [address]  [place] [address]  Phone: [phone]  Fax: [phone]    Encounter Date: 03/10/2018    Patient ID:  Alexandra Nunez  VQQ:[id number]    DOB: 1955/05/20  Age: 62 y.o. female    Subjective:     Chief Complaint   Patient presents with   . FOLLOWUP INDIVIDUAL THERAPY-MED CHECK   . Palpitations   . Irregular Heartbeat   . Headache   . Dizziness   . Fall   . Leg Swelling     62 year old with a history of AFib status post cardioversion 09/2017 here for a follow up visit and on EKG today she is in AFib RVR      Palpitations    This is a chronic problem. The current episode started more than 1 month ago. The problem occurs constantly. The problem has been unchanged. Nothing aggravates the symptoms. Associated symptoms include anxiety, dizziness and an irregular heartbeat. Pertinent negatives include no chest fullness, chest pain or malaise/fatigue.   Headache    This is a chronic problem. Associated symptoms include dizziness.   Dizziness   This is a chronic problem. The problem occurs daily. The problem has not changed since onset.Associated symptoms include headaches. Pertinent negatives include no chest pain.   Fall   Associated symptoms include headaches.   Leg Swelling   This is a chronic problem. Associated symptoms include headaches. Pertinent negatives include no chest pain.     Current Outpatient Medications   Medication Sig   . acetaminophen (TYLENOL 8 HOUR) 650 mg Oral Tablet Sustained Release Take 1,300 mg by mouth Twice daily   . albuterol sulfate (PROVENTIL OR VENTOLIN OR PROAIR) 90 mcg/actuation Inhalation HFA Aerosol Inhaler Take 2 Puffs by inhalation Every 6 hours as needed   . budesonide-formoterol (SYMBICORT) 160-4.5 mcg/actuation Inhalation HFA Aerosol Inhaler Take 2 Puffs by inhalation Twice daily   . Diltiazem (CARDIZEM CD) 360 mg Oral Capsule, Sust. Release 24 hr Take 1 Cap (360 mg total) by mouth Once a day   . furosemide (LASIX) 40 mg Oral  Tablet TAKE 1 TABLET BY MOUTH EVERY DAY   . hydroCHLOROthiazide (MICROZIDE) 12.5 mg Oral Capsule Take 1 Cap (12.5 mg total) by mouth Once a day   . hydrocortisone 2.5 % Cream Apply topically Twice daily   . Ibuprofen (MOTRIN) 200 mg Oral Tablet Take 200 mg by mouth Three times a day   . losartan (COZAAR) 100 mg Oral Tablet Take 1 Tab (100 mg total) by mouth Once a day   . melatonin 5 mg Oral Tablet, Chewable Take 5 mg by mouth Every night    . methocarbamol (ROBAXIN) 500 mg Oral Tablet Take 500 mg by mouth Three times a day   . predniSONE (DELTASONE) 10 mg Oral Tablet Take 10 mg by mouth Once a day   . Warfarin (COUMADIN) 6 mg Oral Tablet Take 1 Tab (6 mg total) by mouth Every evening for 90 days 1 1/2 tablet on Wednesday and Friday and 1 tablet daily every other day.     Allergies   Allergen Reactions   . Protonix [Pantoprazole]    . Sulfa (Sulfonamides)      Past Medical History:   Diagnosis Date   . Asthma    . Atrial fibrillation (CMS HCC)    . Borderline diabetes    . Chronic joint pain    . CKD (chronic kidney disease) stage 3, GFR 30-59 ml/min (CMS  [facility])    . Essential hypertension    . GERD (gastroesophageal reflux disease)    . Knee pain, bilateral    . Obesity    . Osteoarthritis    . Uterine cancer (CMS [facility])          Past Surgical History:   Procedure Laterality Date   . CARDIOVERSION     . COLONOSCOPY     . HX APPENDECTOMY     . HX DILATION AND CURETTAGE     . HX HYSTERECTOMY     . LAPAROSCOPIC TOTAL HYSTERECTOMY           Family Medical History:     Problem Relation (Age of Onset)    Coronary Artery Disease Mother, Father    HTN <20 y.o. Mother, Father            Social History     Tobacco Use   . Smoking status: Never Smoker   . Smokeless tobacco: Never Used   Substance Use Topics   . Alcohol use: Never     Frequency: Never   . Drug use: Never       Review of Systems   Constitutional: Negative for malaise/fatigue.   Cardiovascular: Positive for palpitations and leg swelling. Negative for chest pain.      Neurological: Positive for dizziness and headaches.   Psychiatric/Behavioral: The patient is nervous/anxious.    All other systems reviewed and are negative.    Objective:   Vitals: BP 105/65   Pulse 68   Resp 18   SpO2 96%         Physical Exam  Vitals signs and nursing note reviewed.   Constitutional:       Appearance: She is well-developed.   HENT:      Head: Normocephalic.   Eyes:      Pupils: Pupils are equal, round, and reactive to light.   Neck:      Musculoskeletal: Normal range of motion.      Vascular: No JVD.   Cardiovascular:      Rate and Rhythm: Normal rate and regular rhythm.      Heart sounds: Normal heart sounds. No murmur.   Pulmonary:      Effort: Pulmonary effort is normal.      Breath sounds: Normal breath sounds.   Abdominal:      General: Bowel sounds are normal.      Palpations: Abdomen is soft.   Musculoskeletal: Normal range of motion.         General: No deformity.   Skin:     General: Skin is warm and dry.   Neurological:      Mental Status: She is alert and oriented to person, place, and time.      Cranial Nerves: No cranial nerve deficit.           Cardioversion 09/2017      INR 02/2018      TEE 09/2017        Assessment & Plan:     ENCOUNTER DIAGNOSES     ICD-10-CM   1. PAF (paroxysmal atrial fibrillation) (CMS HCC) I48.0   2. Atrial fibrillation, unspecified type (CMS HCC) I48.91   3. Essential hypertension I10   4. Dizzy spells R42     AFib RVR  Sent to ER  LA is not really enlarged would attempt cardioversion  Given INR being low she would need TEE to assess for LA thrombus prior to cardioversion  She requests  that she not be put on rhythm with management declines an EP consultation she was given information on Cox Maze procedure and AFib ablation.  She would like to come off the Coumadin and be started on Eliquis as well.  All of these things could be addressed if she is admitted due to AFib RVR via the ER.    No orders of the defined types were placed in this encounter.  This  note may have been partially generated using MModal Fluency Direct system, and there may be some incorrect words, spellings, and punctuation that were not noted in checking the note before saving.      Return in about 4 weeks (around 04/07/2018).    Leo Grosser, MD

## 2018-03-10 NOTE — Nursing Note (Signed)
Pt did not have a med list with them at visit - went over list with patient   Advised patient to check AVS list with medicines at home and call if any discrepancies    -Confirmed correct pharmacy with patient for e-scribing     [place], Raymond  03/10/2018, 09:23

## 2018-03-10 NOTE — Telephone Encounter (Signed)
I tried to return a call to the pt. There was no answer so I left a message for her to return my call.  Eleonore Chiquito, LPN  01/0/4045, 91:36

## 2018-03-10 NOTE — Progress Notes (Signed)
Subjective:     Patient ID:  Alexandra Nunez is an 62 y.o. female     Chief Complaint:    Chief Complaint   Patient presents with   . Medication Advice     Discuss pain meds, face to face for power chair.       Patient is a 62 year old female with history of multiple comorbidities including morbid obesity, AFib, CKD, degenerative arthritis of both knees, and chronic pain who presents for follow-up visit.  Patient is requesting opioid pain medication and states that she has been on hydrocodone previously.  Patient has severe degenerative changes in her bilateral knees per x-rays taken earlier this year.  Patient has been to 2 previous pain clinics who declined supply her due to her high risk potential for abuse.  She does have CKD, but renal function is excellent at this time.  Patient takes Tylenol/Motrin for her chronic knee/back pain which she states helped somewhat.  Patient states she is unable to ambulate more than a few steps with a walker due to considerable and taxing effort.  She is able to transfer weight.  Patient reports falling at her mother's house this morning.  She is not sure of the mechanism injury, but fell and hurt her right knee and left forearm.  She reports significant bruising and pain over the right knee.  Patient does take warfarin for AFib and follows with Dr. Deatra Canter.  Patient is in a wheelchair and is requesting a power chair for improved mobility.        Past Medical History:   Diagnosis Date   . Asthma    . Atrial fibrillation (CMS HCC)    . Borderline diabetes    . Chronic joint pain    . CKD (chronic kidney disease) stage 3, GFR 30-59 ml/min (CMS HCC)    . Essential hypertension    . GERD (gastroesophageal reflux disease)    . Knee pain, bilateral    . Obesity    . Osteoarthritis    . Uterine cancer (CMS HCC)      Current Outpatient Medications   Medication Sig   . acetaminophen (TYLENOL 8 HOUR) 650 mg Oral Tablet Sustained Release Take 1,300 mg by mouth Twice daily   . albuterol sulfate  (PROVENTIL OR VENTOLIN OR PROAIR) 90 mcg/actuation Inhalation HFA Aerosol Inhaler Take 2 Puffs by inhalation Every 6 hours as needed   . budesonide-formoterol (SYMBICORT) 160-4.5 mcg/actuation Inhalation HFA Aerosol Inhaler Take 2 Puffs by inhalation Twice daily   . diclofenac sodium (VOLTAREN) 1 % Gel by Apply Topically route Three times a day   . Diltiazem (CARDIZEM CD) 360 mg Oral Capsule, Sust. Release 24 hr Take 1 Cap (360 mg total) by mouth Once a day   . furosemide (LASIX) 40 mg Oral Tablet TAKE 1 TABLET BY MOUTH EVERY DAY   . hydroCHLOROthiazide (MICROZIDE) 12.5 mg Oral Capsule Take 1 Cap (12.5 mg total) by mouth Once a day   . hydrocortisone 2.5 % Cream Apply topically Twice daily   . Ibuprofen (MOTRIN) 200 mg Oral Tablet Take 200 mg by mouth Three times a day   . losartan (COZAAR) 100 mg Oral Tablet Take 1 Tab (100 mg total) by mouth Once a day   . melatonin 5 mg Oral Tablet, Chewable Take 5 mg by mouth Every night    . methocarbamol (ROBAXIN) 500 mg Oral Tablet Take 500 mg by mouth Three times a day   . predniSONE (DELTASONE) 10 mg Oral Tablet  Take 10 mg by mouth Once a day   . Warfarin (COUMADIN) 6 mg Oral Tablet Take 1 Tab (6 mg total) by mouth Every evening for 90 days 1 1/2 tablet on Wednesday and Friday and 1 tablet daily every other day.       Review of Systems   Constitutional: Negative.    HENT: Negative.    Eyes: Negative.    Respiratory: Negative.    Cardiovascular: Negative.    Gastrointestinal: Negative.    Endocrine: Negative.    Genitourinary: Negative.    Musculoskeletal: Positive for arthralgias and back pain.   Skin: Negative.    Allergic/Immunologic: Negative.    Neurological: Negative.    Hematological: Bruises/bleeds easily.   Psychiatric/Behavioral: Negative.        Objective:     Physical Exam  Constitutional:       Appearance: She is obese.   HENT:      Head: Normocephalic and atraumatic.      Nose: Nose normal.   Eyes:      Extraocular Movements: Extraocular movements intact.       Conjunctiva/sclera: Conjunctivae normal.   Cardiovascular:      Rate and Rhythm: Tachycardia present. Rhythm irregular.   Pulmonary:      Effort: Pulmonary effort is normal.      Breath sounds: Normal breath sounds.   Abdominal:      General: Bowel sounds are normal.      Palpations: Abdomen is soft.   Musculoskeletal:         General: Tenderness and signs of injury present.   Skin:     General: Skin is warm and dry.      Findings: Bruising present.      Comments: Patient has significant bruising over the right knee and left forearm   Neurological:      Mental Status: She is alert and oriented to person, place, and time. Mental status is at baseline.   Psychiatric:         Mood and Affect: Mood normal.         Behavior: Behavior normal.         Thought Content: Thought content normal.         Judgment: Judgment normal.         Ortho Exam  Vitals:    03/10/18 1419   BP: 120/80   Pulse: (!) 109   Resp: 16   Temp: 36.7 C (98.1 F)   SpO2: 97%       Assessment & Plan:       ICD-10-CM    1. Fall, initial encounter W19.XXXA Right Knee Xray   2. Osteoarthritis, unspecified osteoarthritis type, unspecified site M19.90 diclofenac sodium (VOLTAREN) 1 % Gel   3. Atrial fibrillation, unspecified type (CMS HCC) I48.91    4. Hyperlipidemia, unspecified hyperlipidemia type E78.5    5. Bilateral chronic knee pain M25.561     M25.562     G89.29    6. Morbid obesity (CMS HCC) E66.01    7. Essential hypertension I10    8. CKD (chronic kidney disease) stage 3, GFR 30-59 ml/min (CMS HCC) N18.3    9. Asthma, unspecified asthma severity, unspecified whether complicated, unspecified whether persistent J45.909      -given patient's high risk for abuse, we declined to give opioid pain medications.  We are starting her on Voltaren gel which we explained has a low risk of decreasing renal function.  Recommended continuing Tylenol as well.  Will  obtain films of the right knee from her fall this morning.  Significant bruising was noted over the  right knee on exam.  We feel that patient would qualify and benefit from a power chair given her her comorbidities, general deconditioning, immobility, chronic pain, and extreme difficulty in any ambulation.        The patient was given the opportunity to ask questions and those questions were answered to the patient's satisfaction. The patient was encouraged to call with any additional questions or concerns. Instructed patient to call back if symptoms worsen.     Discussed with patient effects and side effects of medications. Medication safety was discussed. A copy of the patient's medication list was printed and given to the patient. A good faith effort was made to reconcile the patient's medications.     Patient was counseled about lifestyle modification including increasing physical activity and proper diet including balanced diet and portion control. Patient also counseled about age-appropriate health maintenance issues including obtaining regular lab work, eye exams, dental exams, screening procedures/tests, vaccination, and appropriate vitamin supplementation. Also instructed to follow-up with specialists as scheduled    Yolanda Bonine, DO

## 2018-03-10 NOTE — Telephone Encounter (Signed)
Pt contacted. She wanted to know about a form that Dr. Glennon Mac had mentioned that needs to be filled out to get her power chair. I told her she needs to call the company she is buying the chair through and ask them to fax [place] a form. Pt voiced understanding.  Eleonore Chiquito, LPN  11/12/1101, 15:94

## 2018-03-16 ENCOUNTER — Ambulatory Visit (INDEPENDENT_AMBULATORY_CARE_PROVIDER_SITE_OTHER): Payer: Medicare PPO

## 2018-03-16 ENCOUNTER — Other Ambulatory Visit (INDEPENDENT_AMBULATORY_CARE_PROVIDER_SITE_OTHER): Payer: Self-pay | Admitting: INTERNAL MEDICINE CARDIOVASCULAR DISEASE

## 2018-03-16 VITALS — BP 117/78 | HR 89 | Resp 22

## 2018-03-16 DIAGNOSIS — R9431 Abnormal electrocardiogram [ECG] [EKG]: Secondary | ICD-10-CM

## 2018-03-16 DIAGNOSIS — I4891 Unspecified atrial fibrillation: Secondary | ICD-10-CM

## 2018-03-16 NOTE — Nursing Note (Signed)
Pt came into the office today for a repeat EKG after being sent to the ED at appointment on 03/10/18 w/ Dr Deatra Canter. Pt remains in Afib so she has been scheduled for a cardioversion to be completed by Dr Deatra Canter on 03/18/18 at [facility]. Consents obtained, education provided. Pt given a pre admission apt for tomorrow 03/17/18 @ 1:45 at [facility]. She is to report at 1:15 to registration. Pt verbalized an understanding w/ no questions.   Hoy Finlay, RN  03/16/2018, 14:19

## 2018-03-18 DIAGNOSIS — R9431 Abnormal electrocardiogram [ECG] [EKG]: Secondary | ICD-10-CM

## 2018-03-22 ENCOUNTER — Encounter (INDEPENDENT_AMBULATORY_CARE_PROVIDER_SITE_OTHER): Payer: Self-pay | Admitting: Medical

## 2018-03-22 NOTE — Nursing Note (Signed)
Pt called in w/ concerns of high BP readings and a headache. Pt reported BP of 170s/90s. After reviewing her medication list and last office visit per A OKernick PA pt is to take tylenol for headache and rest. Pt has a history of low BP while in office and Ashlee wants to treat conservatively to not bottom out her BP. Pt is continue to monitor BP and if it doesn't go down by this evening she is to report to ED for an evaluation. Pt is to call office in w/ BP readings in a couple of days to see if it remains elevated. Pt verbalized an understanding w/ no questions.  Hoy Finlay, RN  03/22/2018, 15:50

## 2018-03-26 ENCOUNTER — Encounter (INDEPENDENT_AMBULATORY_CARE_PROVIDER_SITE_OTHER): Payer: Self-pay | Admitting: INTERNAL MEDICINE CARDIOVASCULAR DISEASE

## 2018-03-26 ENCOUNTER — Other Ambulatory Visit (HOSPITAL_COMMUNITY): Payer: Self-pay

## 2018-03-29 ENCOUNTER — Other Ambulatory Visit (INDEPENDENT_AMBULATORY_CARE_PROVIDER_SITE_OTHER): Payer: Self-pay | Admitting: Physician Assistant

## 2018-03-29 MED ORDER — HYDRALAZINE 25 MG TABLET
25.00 mg | ORAL_TABLET | Freq: Three times a day (TID) | ORAL | 5 refills | Status: DC
Start: 2018-03-29 — End: 2018-10-25

## 2018-03-29 NOTE — Progress Notes (Signed)
BP has remained uncontrolled at home with readings in the 774'J systolic despite taking Cardizem 360mg  qd, HCTZ 12.5mg  qd, Losartan 100mg  qd and Lasix 40mg  qd. Will add Hydralazine 25mg  every 8 hours. Will have her monitor her blood pressure closely. She is also interested in starting Xarelto or Eliquis. Once her INR is rechecked, we can consider starting Xarelto 20mg  qd every evening (if INR is less than 3.0) due to her obesity. If she is only agreeable to Eliquis, she'll need 5mg  twice daily after INR is less than 2.0.    Samuella Bruin, PA-C  03/29/2018, 16:39  [facility], Section of Cardiology

## 2018-03-30 ENCOUNTER — Other Ambulatory Visit (INDEPENDENT_AMBULATORY_CARE_PROVIDER_SITE_OTHER): Payer: Self-pay | Admitting: Physician Assistant

## 2018-04-02 ENCOUNTER — Telehealth: Payer: Self-pay | Admitting: Internal Medicine

## 2018-04-02 MED ORDER — BUDESONIDE-FORMOTEROL FUMARATE 160-4.5 MCG/ACT IN AERO: 2.0000 | INHALATION_SPRAY | Freq: Two times a day (BID) | RESPIRATORY_TRACT | 1 refills | Status: DC

## 2018-04-02 NOTE — Telephone Encounter (Signed)
Called and spoke to pt, who is requesting Rx for symbicort 160. Rx for symbicort 160 has been sent to preferred pharmacy. Nothing further is needed.

## 2018-04-06 ENCOUNTER — Ambulatory Visit (INDEPENDENT_AMBULATORY_CARE_PROVIDER_SITE_OTHER): Payer: Self-pay | Admitting: Nurse Practitioner

## 2018-04-06 NOTE — Nursing Note (Signed)
Called patient to go over results, no answer at number listed on file, left message for patient to return call to office.

## 2018-04-06 NOTE — Nursing Note (Signed)
04/06/18 0900   [facility] PT/INR   Date INR drawn 04/05/18   PT 23.4   INR 2.05   Comments continue current dose   Follow up/ Next visit recheck in 1 week   Initials JRL

## 2018-04-09 ENCOUNTER — Other Ambulatory Visit (INDEPENDENT_AMBULATORY_CARE_PROVIDER_SITE_OTHER): Payer: Self-pay

## 2018-04-09 ENCOUNTER — Telehealth (INDEPENDENT_AMBULATORY_CARE_PROVIDER_SITE_OTHER): Payer: Self-pay | Admitting: Nurse Practitioner

## 2018-04-09 DIAGNOSIS — I4891 Unspecified atrial fibrillation: Secondary | ICD-10-CM

## 2018-04-09 NOTE — Telephone Encounter (Signed)
Pt called to ask the number for her INR. She said she has been trying to get her cardiologist to switch her to eliquis but will not do that unless it is at exactly 2.0. Per Arlis Porta, APRN, I told her there is no way to get her number at exactly 2.0 as it changes all the time because of her diet as well as her medication. Pt voiced understanding.  Eleonore Chiquito, LPN  10/13/8100, 54:86

## 2018-04-20 ENCOUNTER — Encounter (INDEPENDENT_AMBULATORY_CARE_PROVIDER_SITE_OTHER): Payer: Self-pay | Admitting: Nurse Practitioner

## 2018-04-22 ENCOUNTER — Encounter (INDEPENDENT_AMBULATORY_CARE_PROVIDER_SITE_OTHER): Payer: Self-pay | Admitting: Surgical

## 2018-04-23 ENCOUNTER — Ambulatory Visit (INDEPENDENT_AMBULATORY_CARE_PROVIDER_SITE_OTHER): Payer: Self-pay | Admitting: Physician Assistant

## 2018-04-23 ENCOUNTER — Other Ambulatory Visit (INDEPENDENT_AMBULATORY_CARE_PROVIDER_SITE_OTHER): Payer: Self-pay | Admitting: Physician Assistant

## 2018-04-23 DIAGNOSIS — I10 Essential (primary) hypertension: Secondary | ICD-10-CM

## 2018-04-23 NOTE — Telephone Encounter (Addendum)
-----   Message from Samuella Bruin, PA-C sent at 04/23/2018  3:47 PM EST -----  ----- Message from Georgina Snell sent at 04/06/2018  3:42 PM EST -----  Pt called and said she was recently prescribed Hydralazine for BP and it is not working and asked if it could be switched to another medication.        Patient has called into the office  This patient picked up the hydralazine on 03/30/18, and she has been taking it since. She has been taking the losartan 100mg , in 1/2s. She takes 1 half in the afternoon, and the other when she goes to bed, if she can take it. She said she feels that if she would take the full it will harm her because the pharmacist told her that its not effective if not taken all the time. She said the hydralazine, drops the bps to 130/90, then the losartan drops it more. She has still been taking all other medications.   Her bps 160/90 after meds. 180/104 after meds before bed   After adding hydralazine 141/81 pulse 59. I spoke with A. Nestor PAC, she wants to order Losartan 50mg , and for her to have a basic in a week. Phoned patient LMOM.    Youlanda Roys, LPN  4/46/9507, 22:57          Spoke to patients husband, informed of the medication, and for the patient to have the labs done a week after the medication starts. He verbalized understanding, with no questions at this time.   Youlanda Roys, LPN  08/10/1831, 58:25

## 2018-04-26 MED ORDER — LOSARTAN 50 MG TABLET
50.0000 mg | ORAL_TABLET | Freq: Every day | ORAL | 3 refills | Status: DC
Start: 2018-04-26 — End: 2019-01-27

## 2018-04-29 ENCOUNTER — Ambulatory Visit (INDEPENDENT_AMBULATORY_CARE_PROVIDER_SITE_OTHER): Payer: Self-pay | Admitting: Surgical

## 2018-04-29 NOTE — Telephone Encounter (Signed)
Orders for lift chair faxed to [place].  Eleonore Chiquito, LPN  2/78/7183, 67:25

## 2018-04-29 NOTE — Telephone Encounter (Signed)
-----   Message from Merwyn Katos sent at 04/29/2018 11:08 AM EST -----  WANTS TO TALK TO YOU

## 2018-05-03 ENCOUNTER — Ambulatory Visit (INDEPENDENT_AMBULATORY_CARE_PROVIDER_SITE_OTHER): Payer: Medicare PPO | Admitting: Internal Medicine

## 2018-05-03 ENCOUNTER — Encounter (INDEPENDENT_AMBULATORY_CARE_PROVIDER_SITE_OTHER): Payer: Self-pay | Admitting: Internal Medicine

## 2018-05-03 VITALS — BP 118/64 | HR 62 | Ht 64.0 in | Wt 249.0 lb

## 2018-05-03 DIAGNOSIS — I480 Paroxysmal atrial fibrillation: Secondary | ICD-10-CM

## 2018-05-03 DIAGNOSIS — I10 Essential (primary) hypertension: Secondary | ICD-10-CM

## 2018-05-03 NOTE — Progress Notes (Signed)
[facility]  [address]  [place] [address]  Phone: [phone]  Fax: [phone]    Encounter Date: 05/03/2018    Patient ID:  Dorianne Perret  UXN:[id number]    DOB: Sep 03, 1955  Age: 63 y.o. female    Subjective:     Chief Complaint   Patient presents with   . Follow Up 6 Months   . Atrial Fibrillation   . Fatigue     Mrs. Alexandra Nunez is a pleasant 63 year old lady with a history of persistent atrial fibrillation and morbid obesity. She underwent synchronized cardioversion at [facility] on 09/21/17 for her symptomatic atrial fibrillation and converted to normal sinus rhythm with 200J x1. A transesophageal echocardiogram was completed on 10/01/17 and showed preserved EF at 60%, no significant valvular abnormalities and adequate size of LA appendage with no smoke or thrombus.   She is in my office today for a follow-up visit she felt she went back into AFib on January 25th after having a spicy meal.  She does indicate that she does not want to go back on amiodarone or any of the rhythm medications.  She does indicate in the past workup for sleep apnea has not been successful.  No complaint of chest pain palpitation syncope or near syncope she is on a wheelchair because of the limited mobility.  She does indicate that in the past she has considered even gastric bypass.  Compliant with her medications as listed below.        Current Outpatient Medications   Medication Sig   . acetaminophen (TYLENOL 8 HOUR) 650 mg Oral Tablet Sustained Release Take 1,300 mg by mouth Twice daily   . albuterol sulfate (PROVENTIL OR VENTOLIN OR PROAIR) 90 mcg/actuation Inhalation HFA Aerosol Inhaler Take 2 Puffs by inhalation Every 6 hours as needed   . budesonide-formoterol (SYMBICORT) 160-4.5 mcg/actuation Inhalation HFA Aerosol Inhaler Take 2 Puffs by inhalation Twice daily   . Diltiazem (CARDIZEM CD) 360 mg Oral Capsule, Sust. Release 24 hr Take 1 Cap (360 mg total) by mouth Once a day   . furosemide (LASIX) 40 mg  Oral Tablet TAKE 1 TABLET BY MOUTH EVERY DAY   . hydrALAZINE (APRESOLINE) 25 mg Oral Tablet Take 1 Tab (25 mg total) by mouth Three times a day   . Ibuprofen (MOTRIN) 200 mg Oral Tablet Take 200 mg by mouth Three times a day   . losartan (COZAAR) 50 mg Oral Tablet Take 1 Tab (50 mg total) by mouth Once a day   . melatonin 5 mg Oral Tablet, Chewable Take 5 mg by mouth Every night    . predniSONE (DELTASONE) 10 mg Oral Tablet Take 10 mg by mouth Once a day   . Warfarin (COUMADIN) 6 mg Oral Tablet Take 1 Tab (6 mg total) by mouth Every evening for 90 days 1 1/2 tablet on Wednesday and Friday and 1 tablet daily every other day.     Allergies   Allergen Reactions   . Protonix [Pantoprazole]    . Sulfa (Sulfonamides)      Past Medical History:   Diagnosis Date   . Asthma    . Atrial fibrillation (CMS HCC)    . Borderline diabetes    . Chronic joint pain    . CKD (chronic kidney disease) stage 3, GFR 30-59 ml/min (CMS HCC)    . Essential hypertension    . GERD (gastroesophageal reflux disease)    . Knee pain, bilateral    . Obesity    .  Osteoarthritis    . Uterine cancer (CMS [facility])          Past Surgical History:   Procedure Laterality Date   . CARDIOVERSION     . COLONOSCOPY     . HX APPENDECTOMY     . HX DILATION AND CURETTAGE     . HX HYSTERECTOMY     . LAPAROSCOPIC TOTAL HYSTERECTOMY           Family Medical History:     Problem Relation (Age of Onset)    Coronary Artery Disease Mother, Father    HTN <20 y.o. Mother, Father            Social History     Tobacco Use   . Smoking status: Never Smoker   . Smokeless tobacco: Never Used   Substance Use Topics   . Alcohol use: Never     Frequency: Never   . Drug use: Never       Review of Systems   Constitutional: Positive for fatigue. Negative for activity change, appetite change, chills, diaphoresis, fever and unexpected weight change.   HENT: Negative for hearing loss.    Eyes: Negative for visual disturbance.   Respiratory: Positive for shortness of breath. Negative for  apnea, cough, chest tightness and wheezing.    Cardiovascular: Positive for palpitations. Negative for chest pain and leg swelling.   Gastrointestinal: Negative for abdominal pain, blood in stool, nausea and vomiting.   Endocrine: Negative for cold intolerance and heat intolerance.   Genitourinary: Negative for hematuria.   Musculoskeletal: Positive for arthralgias, back pain and gait problem. Negative for myalgias.   Skin: Negative for color change and rash.   Neurological: Negative for dizziness, seizures, syncope, speech difficulty, weakness and light-headedness.   Hematological: Does not bruise/bleed easily.   Psychiatric/Behavioral: Negative for sleep disturbance. The patient is not nervous/anxious.      Objective:   Vitals: BP 118/64   Pulse 62   Ht 1.626 m (5\' 4" )   Wt 112.9 kg (249 lb)   SpO2 94%   BMI 42.74 kg/m         Physical Exam  Constitutional:       General: She is not in acute distress.     Appearance: She is obese. She is not ill-appearing.   HENT:      Head: Normocephalic and atraumatic.      Nose: Nose normal. No congestion or rhinorrhea.      Mouth/Throat:      Mouth: Mucous membranes are moist.   Eyes:      General: No scleral icterus.     Extraocular Movements: Extraocular movements intact.      Conjunctiva/sclera: Conjunctivae normal.      Pupils: Pupils are equal, round, and reactive to light.   Neck:      Musculoskeletal: Normal range of motion and neck supple.   Cardiovascular:      Rate and Rhythm: Tachycardia present. Rhythm irregular.      Heart sounds: No murmur.   Pulmonary:      Effort: No respiratory distress.      Breath sounds: No stridor. Wheezing present. No rhonchi or rales.   Neurological:      General: No focal deficit present.      Mental Status: She is alert and oriented to person, place, and time.   Psychiatric:         Mood and Affect: Mood normal.         Assessment &  Plan:     ENCOUNTER DIAGNOSES     ICD-10-CM   1. PAF (paroxysmal atrial fibrillation) (CMS HCC)  I48.0   2. Essential hypertension I10   3. Morbid obesity (CMS HCC) E58.34     63 year old morbidly obese woman with history of paroxysmal AFib reverted back to AFib at least for the last few days that she is aware of.  She does not want to pursue rhythm control with amiodarone or similar medications.  Will try the strategy of rate control by adding metoprolol to her current medications.  I will also give her a referral to see cardiac electrophysiology.  She inquired about doing cardioversion versus AFib ablation.  I did indicate the fact that she has had cardioversions in the past and still went back to AFib probably will be more durable if she goes for a more durable procedure such as AFib ablation however her weight might be a problem as she is morbidly obese.  I will refer her to cardiac electrophysiology.  I will add metoprolol to her current medication advised to continue with the diltiazem as well as Coumadin.  I told her to hold off on her hydralazine while she is taking metoprolol and Cardizem if the blood pressure goes above 140/90 then may go back on hydralazine.  Orders Placed This Encounter   . AMB CONSULT/REFERRAL CARD ELECTROPHYSIOLOGY [place]   . ECG - ADD ON IN CLINIC - SAME DAY       Return in about 3 months (around 08/02/2018).    Zella Ball, MD, [facility], [facility]  Interventional Cardiologist  Associate Professor   [facility] Department of Medicine, Section of Cardiology    05/03/2018 15:22

## 2018-05-03 NOTE — Nursing Note (Signed)
Pt did not have a med list with them at visit - went over list with patient   Advised patient to check AVS list with medicines at home and call if any discrepancies    -Confirmed correct pharmacy with patient for e-scribing.   Excell Seltzer, MA  05/03/2018, 14:03

## 2018-05-06 ENCOUNTER — Ambulatory Visit (INDEPENDENT_AMBULATORY_CARE_PROVIDER_SITE_OTHER): Payer: Self-pay | Admitting: Internal Medicine

## 2018-05-06 ENCOUNTER — Other Ambulatory Visit (INDEPENDENT_AMBULATORY_CARE_PROVIDER_SITE_OTHER): Payer: Self-pay | Admitting: Internal Medicine

## 2018-05-06 MED ORDER — METOPROLOL SUCCINATE ER 25 MG TABLET,EXTENDED RELEASE 24 HR
25.0000 mg | ORAL_TABLET | Freq: Every day | ORAL | 3 refills | Status: DC
Start: 2018-05-06 — End: 2018-12-20

## 2018-05-06 NOTE — Telephone Encounter (Addendum)
Toprol 25mg  once daily sent to pharmacy. Patient notified and understanding that script was sent to pharmacy and this medication is to be taken once daily. Patient had no more questions at this time.  Excell Seltzer, MA  05/06/2018, 12:50    ----- Message -----   From: Jean Rosenthal   Sent: 05/05/2018  3:20 PM EST   To: Excell Seltzer, MA     Please add Toprol 25 mg once daily.   Ashlee O'Kernick, PA-C 05/05/2018, 15:21       ----- Message -----   From: Excell Seltzer, MA   Sent: 05/05/2018  1:52 PM EST   To: Samuella Bruin, PA-C     I can add this metoprolol to be approved, but his office note only states he wants her to start it. He does not specify how many mg's.   ----- Message -----   From: Gilmore Laroche   Sent: 05/05/2018  9:23 AM EST   To: [id number] Triage        ----- Message from Greer Pickerel, PA-C sent at 05/06/2018 12:04 PM EST -----  ----- Message from Gilmore Laroche sent at 05/05/2018  9:23 AM EST -----  Patient called and stated that at her last visit Dr Lyndee Leo was going to start her on Metoprolol, but the prescription has never been sent to her pharmacy. Please send to [facility] in [place].     Thanks,  rbrown

## 2018-05-10 ENCOUNTER — Other Ambulatory Visit (INDEPENDENT_AMBULATORY_CARE_PROVIDER_SITE_OTHER): Payer: Self-pay

## 2018-05-10 ENCOUNTER — Encounter (INDEPENDENT_AMBULATORY_CARE_PROVIDER_SITE_OTHER): Payer: Self-pay | Admitting: Surgical

## 2018-05-10 DIAGNOSIS — I10 Essential (primary) hypertension: Secondary | ICD-10-CM

## 2018-05-18 ENCOUNTER — Ambulatory Visit (HOSPITAL_COMMUNITY): Payer: Self-pay | Admitting: Family

## 2018-06-21 ENCOUNTER — Ambulatory Visit (HOSPITAL_COMMUNITY): Payer: Self-pay | Admitting: Family

## 2018-06-24 ENCOUNTER — Encounter (INDEPENDENT_AMBULATORY_CARE_PROVIDER_SITE_OTHER): Payer: Self-pay | Admitting: Interventional Cardiology

## 2018-06-24 ENCOUNTER — Other Ambulatory Visit: Payer: Self-pay

## 2018-06-24 ENCOUNTER — Other Ambulatory Visit (INDEPENDENT_AMBULATORY_CARE_PROVIDER_SITE_OTHER): Payer: Self-pay | Admitting: Interventional Cardiology

## 2018-06-24 ENCOUNTER — Other Ambulatory Visit (INDEPENDENT_AMBULATORY_CARE_PROVIDER_SITE_OTHER): Payer: Self-pay | Admitting: Nurse Practitioner

## 2018-06-24 ENCOUNTER — Ambulatory Visit (INDEPENDENT_AMBULATORY_CARE_PROVIDER_SITE_OTHER): Payer: Commercial Managed Care - PPO | Admitting: Interventional Cardiology

## 2018-06-24 VITALS — BP 118/79 | HR 110 | Resp 18 | Ht 64.0 in | Wt 249.0 lb

## 2018-06-24 DIAGNOSIS — I10 Essential (primary) hypertension: Secondary | ICD-10-CM

## 2018-06-24 DIAGNOSIS — I4891 Unspecified atrial fibrillation: Secondary | ICD-10-CM

## 2018-06-24 NOTE — Nursing Note (Signed)
Pt did not have a med list with them at visit - went over list with patient   Advised patient to check AVS list with medicines at home and call if any discrepancies    -Confirmed correct pharmacy with patient for e-scribing   Patient is already signed up for [facility], and verbalized she will continue to use it.  Youlanda Roys, LPN  9/35/5217, 47:15

## 2018-06-24 NOTE — Progress Notes (Signed)
Alexandra [facility]  [address]  [place] [address]  Phone: [phone]  Fax: [phone]    Encounter Date: 06/24/2018    Patient ID:  Alexandra Nunez  [id number]:[id number]    DOB: [birth year]  Age: 63 y.o. female    Subjective:     Chief Complaint   Patient presents with   . Follow-up   . Atrial Fibrillation   . Weakness   . Fatigue     HPI   [name] is a 63 year old morbidly obese patient in wheelchair with past medical history of paroxysmal atrial fibrillation status post cardioversion x2 has come back with complaints of palpitations and fatigue.  She was referred by [name] to [facility] in [place] on 05/03/18 for possible AFib ablation, but her schedules have been postponed.  She has no complaints of chest pain, leg swelling or loss of consciousness.  She has dizziness and shortness of breath without PND or orthopnea.  She has preserved LV function on echocardiogram and 2019. She did not tolerate antiarrhythmics such as amiodarone in the past.    Current Outpatient Medications   Medication Sig   . acetaminophen (TYLENOL 8 HOUR) 650 mg Oral Tablet Sustained Release Take 1,300 mg by mouth Twice daily   . albuterol sulfate (PROVENTIL OR VENTOLIN OR PROAIR) 90 mcg/actuation Inhalation HFA Aerosol Inhaler Take 2 Puffs by inhalation Every 6 hours as needed   . budesonide-formoterol (SYMBICORT) 160-4.5 mcg/actuation Inhalation HFA Aerosol Inhaler Take 2 Puffs by inhalation Twice daily   . Diltiazem (CARDIZEM CD) 360 mg Oral Capsule, Sust. Release 24 hr Take 1 Cap (360 mg total) by mouth Once a day   . furosemide (LASIX) 40 mg Oral Tablet TAKE 1 TABLET BY MOUTH EVERY DAY   . hydrALAZINE (APRESOLINE) 25 mg Oral Tablet Take 1 Tab (25 mg total) by mouth Three times a day   . Ibuprofen (MOTRIN) 200 mg Oral Tablet Take 200 mg by mouth Three times a day   . losartan (COZAAR) 50 mg Oral Tablet Take 1 Tab (50 mg total) by mouth Once a day   . melatonin 5 mg Oral Tablet, Chewable Take 5 mg by mouth  Every night    . metoprolol succinate (TOPROL-XL) 25 mg Oral Tablet Sustained Release 24 hr Take 1 Tab (25 mg total) by mouth Once a day   . predniSONE (DELTASONE) 10 mg Oral Tablet Take 10 mg by mouth Once a day   . Warfarin (COUMADIN) 6 mg Oral Tablet Take 1 Tab (6 mg total) by mouth Every evening for 90 days 1 1/2 tablet on Wednesday and Friday and 1 tablet daily every other day.     Allergies   Allergen Reactions   . Protonix [Pantoprazole]    . Sulfa (Sulfonamides)      Past Medical History:   Diagnosis Date   . Asthma    . Atrial fibrillation (CMS HCC)    . Borderline diabetes    . Chronic joint pain    . CKD (chronic kidney disease) stage 3, GFR 30-59 ml/min (CMS HCC)    . Essential hypertension    . GERD (gastroesophageal reflux disease)    . Knee pain, bilateral    . Obesity    . Osteoarthritis    . Uterine cancer (CMS [facility])          Past Surgical History:   Procedure Laterality Date   . CARDIOVERSION     . COLONOSCOPY     .  HX APPENDECTOMY     . HX DILATION AND CURETTAGE     . HX HYSTERECTOMY     . LAPAROSCOPIC TOTAL HYSTERECTOMY           Family Medical History:     Problem Relation (Age of Onset)    Coronary Artery Disease Mother, Father    HTN <20 y.o. Mother, Father            Social History     Tobacco Use   . Smoking status: Never Smoker   . Smokeless tobacco: Never Used   Substance Use Topics   . Alcohol use: Never     Frequency: Never   . Drug use: Never       Review of Systems   Constitutional: Positive for fatigue. Negative for chills, diaphoresis, fever and unexpected weight change.   HENT: Negative for congestion, dental problem, nosebleeds and sinus pain.    Eyes: Negative for photophobia and pain.   Respiratory: Positive for cough and shortness of breath. Negative for chest tightness, wheezing and stridor.    Cardiovascular: Positive for palpitations. Negative for chest pain and leg swelling.   Gastrointestinal: Negative for abdominal distention, abdominal pain, blood in stool, constipation,  diarrhea, nausea and vomiting.   Endocrine: Negative for cold intolerance and heat intolerance.   Genitourinary: Negative for dysuria, flank pain and frequency.   Musculoskeletal: Negative for arthralgias, back pain, gait problem and joint swelling.   Skin: Negative for color change and pallor.   Neurological: Negative for dizziness, seizures, syncope, facial asymmetry, light-headedness and headaches.   Psychiatric/Behavioral: Negative for behavioral problems, confusion, dysphoric mood, hallucinations and sleep disturbance.     Objective:   Vitals: BP 118/79   Pulse (!) 110   Resp 18   Ht 1.626 m (5\' 4" )   Wt 112.9 kg (249 lb)   SpO2 95%   BMI 42.74 kg/m         Physical Exam  Vitals signs reviewed.   Constitutional:       Comments: Morbidly obese.  In a wheelchair   HENT:      Head: Normocephalic and atraumatic.      Nose: No congestion or rhinorrhea.      Mouth/Throat:      Mouth: Mucous membranes are moist.   Eyes:      Extraocular Movements: Extraocular movements intact.      Conjunctiva/sclera: Conjunctivae normal.      Pupils: Pupils are equal, round, and reactive to light.   Neck:      Musculoskeletal: Normal range of motion and neck supple.   Cardiovascular:      Rate and Rhythm: Normal rate and regular rhythm.      Pulses: Normal pulses.      Heart sounds: Normal heart sounds.   Pulmonary:      Effort: Pulmonary effort is normal.      Breath sounds: Wheezing present. No rhonchi.   Abdominal:      General: Abdomen is flat. Bowel sounds are normal. There is no distension.      Palpations: Abdomen is soft. There is no mass.   Musculoskeletal:         General: No swelling, tenderness or deformity.   Skin:     General: Skin is warm and dry.      Capillary Refill: Capillary refill takes less than 2 seconds.      Coloration: Skin is not jaundiced or pale.      Findings: No bruising or  erythema.   Neurological:      General: No focal deficit present.      Mental Status: She is alert and oriented to person,  place, and time.      Cranial Nerves: No cranial nerve deficit.      Sensory: No sensory deficit.      Motor: No weakness.      Coordination: Coordination normal.      Gait: Gait normal.      Deep Tendon Reflexes: Reflexes normal.   Psychiatric:         Mood and Affect: Mood normal.         Behavior: Behavior normal.      ECG showed atrial fibrillation with a heart rate of 101 beats per minute    Assessment & Plan:     ENCOUNTER DIAGNOSES     ICD-10-CM   1. Atrial fibrillation, unspecified type (CMS HCC) I48.91   2. Essential hypertension I10   3. Morbid obesity (CMS [place]) E66.01     Ms. Horrell is a 63 year old morbidly obese patient in wheelchair with past medical history of paroxysmal atrial fibrillation status post cardioversion x2 has come back with complaints of palpitations and fatigue.  She is on anticoagulation with warfarin and INR is in therapeutic range.  I recommended cardioversion and also to follow up with the [facility] for consideration of ablation.  She also has asthma for which she is on inhalers and steroids.  Orders Placed This Encounter   . [facility] Schedule External Cardioversion       Return in about 3 months (around 09/24/2018).    Roe Rutherford, MD

## 2018-06-28 ENCOUNTER — Ambulatory Visit (INDEPENDENT_AMBULATORY_CARE_PROVIDER_SITE_OTHER): Payer: Self-pay | Admitting: INTERNAL MEDICINE CARDIOVASCULAR DISEASE

## 2018-06-28 NOTE — Telephone Encounter (Signed)
Contacted patient and provided the below instructions.    Patient ordered cardioversion Dr. Deatra Canter to be the physician to perform the procedure.    Patient scheduled for 07/01/2018 @ 2 PM  and instructed to report to [facility] registration @ 12 PM.    Patient was instructed to report to [facility] registration on 06/29/2018 @ 10:30 AM for pre admissions appointment.    Patient aware that someone will call them the afternoon/evening before to confirm time  of arrival and procedure.    Patient will be NPO at midnight.     Patient will have someone to drive them home.    Reviewed medication list.     The patient may take all other meds with a sip of water.    [place], [place]  06/28/2018, 13:07

## 2018-06-29 DIAGNOSIS — I4891 Unspecified atrial fibrillation: Secondary | ICD-10-CM

## 2018-06-29 DIAGNOSIS — R9431 Abnormal electrocardiogram [ECG] [EKG]: Secondary | ICD-10-CM

## 2018-07-01 ENCOUNTER — Other Ambulatory Visit (INDEPENDENT_AMBULATORY_CARE_PROVIDER_SITE_OTHER): Payer: Self-pay | Admitting: Physician Assistant

## 2018-07-01 DIAGNOSIS — R5383 Other fatigue: Secondary | ICD-10-CM

## 2018-07-01 DIAGNOSIS — I480 Paroxysmal atrial fibrillation: Secondary | ICD-10-CM

## 2018-07-01 DIAGNOSIS — E669 Obesity, unspecified: Secondary | ICD-10-CM

## 2018-07-01 DIAGNOSIS — R0683 Snoring: Secondary | ICD-10-CM

## 2018-07-01 MED ORDER — DILTIAZEM CD 240 MG CAPSULE,EXTENDED RELEASE 24 HR
240.0000 mg | ORAL_CAPSULE | Freq: Every day | ORAL | 3 refills | Status: DC
Start: 2018-07-01 — End: 2018-10-15

## 2018-07-01 NOTE — Progress Notes (Signed)
Patient is inquiring about a sleep study. Will get her Epworth score and place the order at her upcoming visit.    Samuella Bruin, PA-C  07/01/2018, 14:32

## 2018-07-06 ENCOUNTER — Ambulatory Visit (INDEPENDENT_AMBULATORY_CARE_PROVIDER_SITE_OTHER): Payer: Self-pay

## 2018-08-17 ENCOUNTER — Ambulatory Visit (INDEPENDENT_AMBULATORY_CARE_PROVIDER_SITE_OTHER): Payer: Self-pay | Admitting: Surgical

## 2018-09-20 ENCOUNTER — Ambulatory Visit (HOSPITAL_BASED_OUTPATIENT_CLINIC_OR_DEPARTMENT_OTHER): Payer: Commercial Managed Care - PPO | Admitting: Obstetrics & Gynecology

## 2018-09-27 ENCOUNTER — Ambulatory Visit (HOSPITAL_BASED_OUTPATIENT_CLINIC_OR_DEPARTMENT_OTHER): Payer: Commercial Managed Care - PPO | Admitting: Obstetrics & Gynecology

## 2018-09-28 ENCOUNTER — Other Ambulatory Visit: Payer: Self-pay

## 2018-09-28 ENCOUNTER — Ambulatory Visit (INDEPENDENT_AMBULATORY_CARE_PROVIDER_SITE_OTHER): Payer: Commercial Managed Care - PPO | Admitting: Physician Assistant

## 2018-09-28 ENCOUNTER — Encounter (INDEPENDENT_AMBULATORY_CARE_PROVIDER_SITE_OTHER): Payer: Self-pay | Admitting: Physician Assistant

## 2018-09-28 VITALS — BP 135/84 | HR 53 | Resp 18 | Ht 65.0 in | Wt 348.6 lb

## 2018-09-28 DIAGNOSIS — I4819 Other persistent atrial fibrillation: Principal | ICD-10-CM

## 2018-09-28 DIAGNOSIS — E669 Obesity, unspecified: Secondary | ICD-10-CM

## 2018-09-28 DIAGNOSIS — I208 Other forms of angina pectoris: Secondary | ICD-10-CM

## 2018-09-28 DIAGNOSIS — I10 Essential (primary) hypertension: Secondary | ICD-10-CM

## 2018-09-28 DIAGNOSIS — R001 Bradycardia, unspecified: Secondary | ICD-10-CM

## 2018-09-28 DIAGNOSIS — R609 Edema, unspecified: Secondary | ICD-10-CM

## 2018-09-28 NOTE — Patient Instructions (Addendum)
Increase Lasix to 60 mg once daily x 1 week  Reduce Na in diet  BMP in 1 week

## 2018-09-28 NOTE — Progress Notes (Signed)
Alexandra Nunez  [address]  Alexandra Nunez [address]  Phone: [phone]  Fax: [phone]    Encounter Date: 09/28/2018    Patient ID:  Alexandra Nunez  JKD:[id number]    DOB: Jul 12, 1955  Age: 63 y.o. female    Subjective:     Chief Complaint   Patient presents with   . Follow Up 3 Months   . Leg Swelling   . Weight Gain     HPI   Alexandra Nunez is a pleasant 63 year old lady [facility] follow up visit. She has a history of persistent atrial fibrillation and morbid obesity. She underwent synchronized cardioversion at [facility] on 09/21/17 for her symptomatic atrial fibrillation and converted to normal sinus rhythm with 200J x1. Cardizem was reduced from 341m qam and 1266mqpm to 36056md, and she was started on Amiodarone 200m57mice a day for one week that was then to be reduced to 200mg26me daily. She subsequently discontinued this medicine. A transesophageal echocardiogram was completed on 10/01/17 and showed preserved EF at 60%, no significant valvular abnormalities and adequate size of LA appendage with no smoke or thrombus. She remained in sinus rhythm at the time of her TEE. She has been on thromboembolic prophylaxis with Coumadin with a goal INR between 2-3. In January of 2020, she felt like she went back into atrial fibrillation. She was referred to the Afib clinic in [place]/27/2020 for consideration for atrial fibrillation ablation, but she has not been able to see them. Due to continued symptoms, she was scheduled for another [facility]CCV [facility]/2020 with successful return to NSR s/p 200J delivered x1. Today, she reports doing well with no known recurrence of atrial fibrillation. She has occasional dizziness, shortness of breath and leg edema. She denies orthopnea, PND or syncope. She is interested in gastric bypass surgery and plans on having consultation with a practice in [place]    Current Outpatient Medications      Medication Sig   . acetaminophen (TYLENOL 8 HOUR) 650 mg Oral Tablet Sustained Release Take 1,300 mg by mouth Twice daily   . albuterol sulfate (PROVENTIL OR VENTOLIN OR PROAIR) 90 mcg/actuation Inhalation HFA Aerosol Inhaler Take 2 Puffs by inhalation Every 6 hours as needed   . budesonide-formoterol (SYMBICORT) 160-4.5 mcg/actuation Inhalation HFA Aerosol Inhaler Take 2 Puffs by inhalation Twice daily   . Diltiazem (CARDIZEM CD) 240 mg Oral Capsule, Sust. Release 24 hr Take 1 Cap (240 mg total) by mouth Once a day   . furosemide (LASIX) 40 mg Oral Tablet TAKE 1 TABLET BY MOUTH EVERY DAY   . hydrALAZINE (APRESOLINE) 25 mg Oral Tablet Take 1 Tab (25 mg total) by mouth Three times a day (Patient not taking: Reported on 09/28/2018)   . Ibuprofen (MOTRIN) 200 mg Oral Tablet Take 200 mg by mouth Three times a day   . losartan (COZAAR) 50 mg Oral Tablet Take 1 Tab (50 mg total) by mouth Once a day   . melatonin 5 mg Oral Tablet, Chewable Take 5 mg by mouth Every night    . metoprolol succinate (TOPROL-XL) 25 mg Oral Tablet Sustained Release 24 hr Take 1 Tab (25 mg total) by mouth Once a day   . predniSONE (DELTASONE) 10 mg Oral Tablet Take 10 mg by mouth Once a day   . Warfarin (COUMADIN) 6 mg Oral Tablet Take 1 Tab (6 mg total)  by mouth Every evening for 90 days 1 1/2 tablet on Wednesday and Friday and 1 tablet daily every other day.     Allergies   Allergen Reactions   . Chocolate [Cocoa]    . Protonix [Pantoprazole]    . Sulfa (Sulfonamides)      Past Medical History:   Diagnosis Date   . Asthma    . Atrial fibrillation (CMS HCC)    . Borderline diabetes    . Chronic joint pain    . CKD (chronic kidney disease) stage 3, GFR 30-59 ml/min (CMS HCC)    . Essential hypertension    . GERD (gastroesophageal reflux disease)    . Knee pain, bilateral    . Obesity    . Osteoarthritis    . Uterine cancer (CMS [facility])          Past Surgical History:   Procedure Laterality Date   . CARDIOVERSION     . COLONOSCOPY     . HX  APPENDECTOMY     . HX DILATION AND CURETTAGE     . HX HYSTERECTOMY     . LAPAROSCOPIC TOTAL HYSTERECTOMY           Family Medical History:     Problem Relation (Age of Onset)    Coronary Artery Disease Mother, Father    HTN <20 y.o. Mother, Father            Social History     Tobacco Use   . Smoking status: Never Smoker   . Smokeless tobacco: Never Used   Substance Use Topics   . Alcohol use: Never     Frequency: Never   . Drug use: Never       Review of Systems   Constitutional: Positive for fatigue. Negative for activity change, appetite change, chills, fever and unexpected weight change.   HENT: Negative for hearing loss.    Respiratory: Positive for shortness of breath. Negative for apnea, cough, chest tightness and wheezing.    Cardiovascular: Positive for leg swelling. Negative for chest pain and palpitations.   Gastrointestinal: Negative for abdominal pain and blood in stool.   Genitourinary: Negative for hematuria.   Musculoskeletal: Positive for arthralgias and gait problem. Negative for myalgias.   Neurological: Positive for dizziness and light-headedness. Negative for tremors, seizures, syncope, facial asymmetry, speech difficulty and headaches.   Psychiatric/Behavioral: Negative for sleep disturbance.     Objective:   Vitals: BP 135/84   Pulse 53   Resp 18   Ht 1.651 m ('5\' 5"' )   Wt (!) 158 kg (348 lb 9.6 oz)   SpO2 94%   BMI 58.01 kg/m         Physical Exam  Vitals signs and nursing note reviewed.   Constitutional:       Appearance: She is well-developed.      Comments: Morbidly obese. In a wheelchair   Neck:      Musculoskeletal: Normal range of motion and neck supple.      Vascular: No JVD.   Cardiovascular:      Rate and Rhythm: Normal rate and regular rhythm.      Pulses: Normal pulses.      Heart sounds: S1 normal and S2 normal. No murmur. No friction rub. No gallop.    Pulmonary:      Effort: Pulmonary effort is normal. No respiratory distress.      Breath sounds: Normal breath sounds. No  wheezing or rales.   Chest:  Chest wall: No tenderness.   Abdominal:      General: Bowel sounds are normal.      Palpations: Abdomen is soft.      Tenderness: There is no abdominal tenderness.   Musculoskeletal: Normal range of motion.         General: No tenderness.      Right lower leg: Edema present.      Left lower leg: Edema present.   Neurological:      Mental Status: She is alert and oriented to person, place, and time.   Psychiatric:         Behavior: Behavior normal.       Last CBC        WBC   HGB   HCT   MCV   Platelets      02/16/18 0926 5.1 13.5 41.4 91.4 247        Last BMP        Na   K   Cl   CO2   BUN   Cr   Calcium   Glucose   Glucose-Fasting        02/16/18 0926 143 4.1 102 30 17 0.82 8.8 113             Blood work 09/07/2018- Na 137, K 4.3, BUN 17, Cr 0.6, eGFR >60. Glucose 97. AST 14, ALT 14. TC 189, TRIG 187, HDL 42, LDL 110.  TSH 1.227.  WBC 6.4, Hgb 13.7, HCT 40.9, PLT 220.       Assessment & Plan:     ENCOUNTER DIAGNOSES     ICD-10-CM   1. Persistent atrial fibrillation with her most recent cardioversion completed at [facility] on 07/01/2018 with successful return to NSR s/p 200J delivered x1. 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 She remains on rate control therapy as well as thromboembolic prophylaxis with Warfarin. Will continue rate control therapy with Toprol XL 25 mg once daily and Cardizem 21m once daily. If she has return, she may be a candidate for Afib ablation as she does not want to be placed on antiarrhythmic therapy. I48.19   2. Sinus bradycardia: stable. R00.1   3. Obesity: diet modification and weight loss recommended. E66.9   4. Anginal equivalent (CMS HCC): Stable. Improvement in symptoms s/p cardioversion. I20.8   5. Essential hypertension: BP is stable on current therapy. I10     Due to weight gain and lower extremity edema, will have her increase Lasix to 60 mg once daily x 1 week and reduce Na  in her diet. Will get a repeat BMP in 1 week to reassess electrolytes and renal function.    Orders Placed This Encounter   . BASIC METABOLIC PANEL   . ECG WITH INTERPRETATION/REPORT (AMB ONLY)     Patient was seen independently with supervising physician available for consultation.    Return in about 1 month (around 10/28/2018), or if symptoms worsen or fail to improve.    ASamuella Bruin PA-C  09/28/2018, 16:43  [place] Department of Medicine, Section of Cardiology

## 2018-09-28 NOTE — Nursing Note (Signed)
Pt did not have a med list with them at visit - went over list with patient   Advised patient to check AVS list with medicines at home and call if any discrepancies    -Confirmed correct pharmacy with patient for e-scribing     Kathlynn Grate, Ambulatory Care Assistant  09/28/2018, 13:36

## 2018-10-15 ENCOUNTER — Other Ambulatory Visit (INDEPENDENT_AMBULATORY_CARE_PROVIDER_SITE_OTHER): Payer: Self-pay | Admitting: Medical

## 2018-10-15 DIAGNOSIS — I4819 Other persistent atrial fibrillation: Secondary | ICD-10-CM

## 2018-10-15 MED ORDER — DILTIAZEM CD 180 MG CAPSULE,EXTENDED RELEASE 24 HR
180.0000 mg | ORAL_CAPSULE | Freq: Every day | ORAL | 0 refills | Status: DC
Start: 2018-10-15 — End: 2018-11-16

## 2018-10-15 NOTE — Telephone Encounter (Addendum)
Spoke to pt and gave her the information below. Pt verbalized an understanding w/ no questions. Order placed for monitor and new script sent to preferred pharmacy.  Hoy Finlay, RN  10/15/2018, 14:06      O'Kernick, Ashlee, PA-C  Hernando Reali, RN            Eliquis should not have reduced her heart rate. We can reduce Cardizem to 180 mg once daily. Please also order a 30 day event monitor. If her heart rate drops and she has symptoms, she should report to the emergency department for evaluation.   Ashlee O'Kernick, PA-C 10/15/2018, 13:58      ----- Message from Greer Pickerel, PA-C sent at 10/15/2018  1:56 PM EDT -----  ----- Message from Georgina Snell sent at 10/15/2018 11:26 AM EDT -----  Pt called and said her BP has been up and down all week.Her pulse has been running low as well. 44-50. She said she is going off Coumadin and changing to Eliquis. She has to stay off Coumadin to see if her blood count stays under 2. When she took her Eliquis she said that is when her pulse rate dropped, She said that is one of  The side effects of Eliquis. She has a question,Can she take less Dilitizem? She said she doesn't want to go off Metoprolol.  Also, she asked what she should do if her pulse drops?

## 2018-10-21 ENCOUNTER — Ambulatory Visit (INDEPENDENT_AMBULATORY_CARE_PROVIDER_SITE_OTHER): Payer: Self-pay | Admitting: Internal Medicine

## 2018-10-21 NOTE — Telephone Encounter (Addendum)
Patient called into the office today upset because she cannot find her PT/INR results that was completed on Monday, At [facility]. I advised the patient to call the lab, and see about her results, because we did not order a PT/INR, and we do not manage PT/INR in the [facility] office. We did not draw the lab on her, and I provided the phone number to [name] for her to call. She stated that we are useless, and that the lab may have lost her blood. I advised her again to call the lab and speak with a manager, to see what happened. She stated that this would be usless because they would charge her again for the cost. She then voiced that she was going to start taking Eliquis on her own. I advised her I would place her on a brief hold to see if I could find the labs. I would be back with her in a few. I placed [name] on hold sand spoke with our Midwife, and we both searched for her labs in [facility] system, along with both front desk staff. We could not find the labs, and she asked me to have the patient call the lab and relay her frustration, and to advise her we do not manage her labs for coumadin.She stated that she asked them to place [name] name on the order because he was the one switching her to Eliquis.  I again relayed this information to [name] that we do not manage the Pt INR and that we did not order the labs, and she became upset, and stated that she would make her own medical decision to start her Eliquis. I advised her that if she did this it would go against the  medical advice form this office.  To be able to switch the  Patient to Eliquis her PT/INR needs to be 2, patient again stated that the lab told her they may have lost her labs, and that they used an old order, but she asked them to change the order to [name] name. I again advised her we did not order her PT/INR, and that I understood her frustration and advised her to phone the lab and to not start her Eliquis with out  knowing what her Pt/Inr is. Pt verbalized she understood and would make her own decision. AJennell [facility] made aware, and agreed with this documentation.   Alexandra Roys, Alexandra Nunez  9/62/9528, 41:32

## 2018-10-25 ENCOUNTER — Ambulatory Visit
Payer: Commercial Managed Care - PPO | Attending: Student in an Organized Health Care Education/Training Program | Admitting: Obstetrics & Gynecology

## 2018-10-25 ENCOUNTER — Other Ambulatory Visit (HOSPITAL_BASED_OUTPATIENT_CLINIC_OR_DEPARTMENT_OTHER): Payer: Self-pay | Admitting: Obstetrics & Gynecology

## 2018-10-25 ENCOUNTER — Other Ambulatory Visit: Payer: Self-pay

## 2018-10-25 ENCOUNTER — Ambulatory Visit (HOSPITAL_BASED_OUTPATIENT_CLINIC_OR_DEPARTMENT_OTHER): Payer: Commercial Managed Care - PPO | Admitting: Obstetrics & Gynecology

## 2018-10-25 ENCOUNTER — Encounter (HOSPITAL_BASED_OUTPATIENT_CLINIC_OR_DEPARTMENT_OTHER): Payer: Self-pay | Admitting: Obstetrics & Gynecology

## 2018-10-25 VITALS — BP 161/71 | HR 61 | Temp 98.0°F | Resp 18 | Ht 65.0 in | Wt 346.1 lb

## 2018-10-25 DIAGNOSIS — F329 Major depressive disorder, single episode, unspecified: Secondary | ICD-10-CM | POA: Insufficient documentation

## 2018-10-25 DIAGNOSIS — Z8542 Personal history of malignant neoplasm of other parts of uterus: Secondary | ICD-10-CM | POA: Insufficient documentation

## 2018-10-25 DIAGNOSIS — N183 Chronic kidney disease, stage 3 unspecified: Secondary | ICD-10-CM

## 2018-10-25 DIAGNOSIS — I4891 Unspecified atrial fibrillation: Secondary | ICD-10-CM | POA: Insufficient documentation

## 2018-10-25 DIAGNOSIS — Z923 Personal history of irradiation: Secondary | ICD-10-CM | POA: Insufficient documentation

## 2018-10-25 DIAGNOSIS — Z791 Long term (current) use of non-steroidal anti-inflammatories (NSAID): Secondary | ICD-10-CM | POA: Insufficient documentation

## 2018-10-25 DIAGNOSIS — R7303 Prediabetes: Secondary | ICD-10-CM | POA: Insufficient documentation

## 2018-10-25 DIAGNOSIS — K219 Gastro-esophageal reflux disease without esophagitis: Secondary | ICD-10-CM | POA: Insufficient documentation

## 2018-10-25 DIAGNOSIS — Z1151 Encounter for screening for human papillomavirus (HPV): Secondary | ICD-10-CM | POA: Insufficient documentation

## 2018-10-25 DIAGNOSIS — Z9889 Other specified postprocedural states: Secondary | ICD-10-CM | POA: Insufficient documentation

## 2018-10-25 DIAGNOSIS — Z124 Encounter for screening for malignant neoplasm of cervix: Secondary | ICD-10-CM | POA: Insufficient documentation

## 2018-10-25 DIAGNOSIS — J45909 Unspecified asthma, uncomplicated: Secondary | ICD-10-CM | POA: Insufficient documentation

## 2018-10-25 DIAGNOSIS — Z90722 Acquired absence of ovaries, bilateral: Secondary | ICD-10-CM | POA: Insufficient documentation

## 2018-10-25 DIAGNOSIS — Z90710 Acquired absence of both cervix and uterus: Secondary | ICD-10-CM | POA: Insufficient documentation

## 2018-10-25 DIAGNOSIS — Z7951 Long term (current) use of inhaled steroids: Secondary | ICD-10-CM | POA: Insufficient documentation

## 2018-10-25 DIAGNOSIS — Z8249 Family history of ischemic heart disease and other diseases of the circulatory system: Secondary | ICD-10-CM | POA: Insufficient documentation

## 2018-10-25 DIAGNOSIS — M1990 Unspecified osteoarthritis, unspecified site: Secondary | ICD-10-CM | POA: Insufficient documentation

## 2018-10-25 DIAGNOSIS — Z7952 Long term (current) use of systemic steroids: Secondary | ICD-10-CM | POA: Insufficient documentation

## 2018-10-25 DIAGNOSIS — I10 Essential (primary) hypertension: Secondary | ICD-10-CM | POA: Insufficient documentation

## 2018-10-25 DIAGNOSIS — Z08 Encounter for follow-up examination after completed treatment for malignant neoplasm: Secondary | ICD-10-CM | POA: Insufficient documentation

## 2018-10-25 NOTE — Cancer Center Note (Signed)
[place]  Department of Gynecology Oncology  New Patient History and Physical      Date: 10/25/2018  Name: Alexandra Nunez  MRN: [id number]  Referring Physician: Carlene Coria, Jodelle Red, *  Primary Care Provider: Arley Phenix, MD    Reason for visit/consultation Endometrial Cancer      History of Present Illness:  Information Obtained from: patient    Alexandra Nunez is a 63 y.o. referred for evaluation of a history of Stage II endometrial cancer. She had a TLH/BSO in 2016 and has been following with Dr. Jeannene Patella) for her surveillance. She also received five weeks of external beam radiation. She has felt great since her surgery and denies any vaginal bleeding, weight fluctuations, or fatigue. She missed her last surveillance visit in feb of this year due to moving from [facility]. Her PMH is complicated by Afib, ablation, D&C x2, HTN, depression, asthma, and lower leg edema.    She was on warfarin for anticoagulation but has been off the past two weeks to try to transition to [facility].     Oncology History:     Uterine cancer (CMS [place])    07/07/2014 Initial Diagnosis     Uterine cancer (CMS HCC)         CA 125:   N/A    GYN:   Menarche:  17  Menopause:  Surgical due to ablation  Mammogram:  N/A  H/O Breast Biopsy:  no  Hx abnormal pap: no  Hx HRT: no  Hx STDs: no  Sexually active: unknown    OB History     Gravida   0    Para   0    Term   0    Preterm   0    AB   0    Living   0       SAB   0    TAB   0    Ectopic   0    Multiple   0    Live Births   0                 Past Medical History  Past Medical History:   Diagnosis Date   . Asthma    . Atrial fibrillation (CMS HCC)    . Borderline diabetes    . Chronic joint pain    . CKD (chronic kidney disease) stage 3, GFR 30-59 ml/min (CMS HCC)    . Essential hypertension    . GERD (gastroesophageal reflux disease)    . Knee pain, bilateral    . Obesity    . Osteoarthritis    . Uterine cancer (CMS [facility])          Past Surgical History:   Procedure Laterality  Date   . CARDIOVERSION     . COLONOSCOPY     . HX APPENDECTOMY     . HX DILATION AND CURETTAGE     . HX HYSTERECTOMY     . LAPAROSCOPIC TOTAL HYSTERECTOMY           Allergies   Allergen Reactions   . Chocolate [Cocoa]    . Ciprofloxacin    . Prednisone      High doses  Other reaction(s): Hypertension (intolerance), Other (See Comments), Other (See Comments)  Increased BP  Raises her blood pressure     . Protonix [Pantoprazole]    . Sulfa (Sulfonamides)      Current Outpatient Medications   Medication  Sig   . acetaminophen (TYLENOL 8 HOUR) 650 mg Oral Tablet Sustained Release Take 1,300 mg by mouth Twice daily   . albuterol sulfate (PROVENTIL OR VENTOLIN OR PROAIR) 90 mcg/actuation Inhalation HFA Aerosol Inhaler Take 2 Puffs by inhalation Every 6 hours as needed   . budesonide-formoterol (SYMBICORT) 160-4.5 mcg/actuation Inhalation HFA Aerosol Inhaler Take 2 Puffs by inhalation Twice daily   . buPROPion (WELLBUTRIN XL) 150 mg extended release 24 hr tablet TK 1 T PO QD   . dilTIAZem (CARDIZEM CD) 180 mg Oral Capsule, Sust. Release 24 hr Take 1 Cap (180 mg total) by mouth Once a day   . ELIQUIS 5 mg Oral Tablet TK 1 T PO BID   . esomeprazole magnesium (NEXIUM) 20 mg Oral Capsule, Delayed Release(E.C.) Take 1 Cap by mouth Every one hour   . fluticasone propionate (FLONASE) 50 mcg/actuation Nasal Spray, Suspension 2 Sprays by Nasal route Every one hour   . furosemide (LASIX) 40 mg Oral Tablet TAKE 1 TABLET BY MOUTH EVERY DAY   . Ibuprofen (MOTRIN) 200 mg Oral Tablet Take 200 mg by mouth Three times a day   . losartan (COZAAR) 50 mg Oral Tablet Take 1 Tab (50 mg total) by mouth Once a day   . melatonin 5 mg Oral Tablet, Chewable Take 5 mg by mouth Every night    . metoprolol succinate (TOPROL-XL) 25 mg Oral Tablet Sustained Release 24 hr Take 1 Tab (25 mg total) by mouth Once a day   . predniSONE (DELTASONE) 10 mg Oral Tablet Take 10 mg by mouth Once a day   . Warfarin (COUMADIN) 6 mg Oral Tablet Take 1 Tab (6 mg total)  by mouth Every evening for 90 days 1 1/2 tablet on Wednesday and Friday and 1 tablet daily every other day.       Family History  Family Medical History:     Problem Relation (Age of Onset)    Coronary Artery Disease Mother, Father    HTN <20 y.o. Mother, Father            Denies FH breast, colon, uterine, ovarian cancer    Social History  Occupation:   Social History     Occupational History   . Not on file    [place] [address]   reports that she has never smoked. She has never used smokeless tobacco. She reports that she does not drink alcohol or use drugs.    ROS: Other than ROS in the HPI, all other systems were negative.    Physical Examination:  Most Recent Vitals      Office Visit from 09/28/2018 in [place] filed at... 09/28/2018 1337   Respiratory Rate  18 filed at... 09/28/2018 1337   BP (Non-Invasive)  135/84 filed at... 09/28/2018 1337   SpO2  94 % filed at... 09/28/2018 1337   Height  1.651 m (5\' 5" ) filed at... 09/28/2018 1337   Weight  (!) 158 kg (348 lb 9.6 oz) filed at... 09/28/2018 1337   BMI (Calculated)  58.13 filed at... 09/28/2018 1337   BSA (Calculated)  2.69 filed at... 09/28/2018 1337      ECOG Status: 3 - Capable of only limited selfcare, confined to bed or chair more than 50% of waking hour.  General: morbidly obese  Eyes: Conjunctivae/corneas clear, PERRLA, EOM's intact. Fundi benign.  HENT:ENT without erythema or injection, mucous membranes moist.  Neck: supple, symmetrical, trachea midline  Lungs: clear to auscultation bilaterally.  Good inspiratory effort.  Cardiovascular: Regular rate and rhythm  Abdomen: soft, non-tender; large panus with edematous tissue  Pelvic exam: exam chaperoned by nurse, NEFG, edematous mons, atrophic vaginal changes noted, uterus and adnexa surgically absent, no mass or tenderness. Rectovaginal exam confirmatory. Rectovaginal septum without nodules or masses. Pap done  today  Extremities: 1+ Edema noted in the BLE  Skin: Skin color, texture, turgor normal. No rashes or lesions  Neurologic: grossly normal  Lymphatics: No nodes appreciated  Psychiatric: AOx3  Access:  No current access site    Labs  No results found for this or any previous visit (from the past 36 hour(s)).    Pathology:  Slide reviewed at [facility]: no  Will need records for [facility] in [facility]      Assessment/Plan:   1. Hx of Stage II uterine cancer  - will continue surveillance at 6 month intervals  - s/p radiation therapy  - no findings on exam today (NED  - records release signed today  - pap done today    2. A-fib  - currently being transitioned from warfarin to eloquis  - Hx of cardioversion x 3  - metoprolol, diltiazem    3. HTN  - followed by PCP  - taking cozar    4. Pre-diabetes  - last A1C in 2019 was 5.7     Return to clinic in 6 months      Raelyn Mora, MD  10/25/2018 17:10     CC:  Misha Corliss Skains, MD  [place] [address]  Raelyn Mora, MD  Obstetrics and Gynecology, PGY-2  [facility] Medicine      I saw and examined the patient.  I reviewed the resident's note.  I agree with the findings and plan of care as documented in the resident's note.  Any exceptions/additions are edited/noted. The results, diagnosis and treatment plan were discussed and all questions were answered.  Counseling and coordination time:  40 minutes of a 60 minute visit.        Cynda Acres, MD

## 2018-10-26 ENCOUNTER — Other Ambulatory Visit (HOSPITAL_BASED_OUTPATIENT_CLINIC_OR_DEPARTMENT_OTHER): Payer: Self-pay

## 2018-10-26 ENCOUNTER — Ambulatory Visit (INDEPENDENT_AMBULATORY_CARE_PROVIDER_SITE_OTHER): Payer: Self-pay | Admitting: Physician Assistant

## 2018-10-26 DIAGNOSIS — Z8542 Personal history of malignant neoplasm of other parts of uterus: Secondary | ICD-10-CM

## 2018-10-26 NOTE — Telephone Encounter (Signed)
-----   Message from Hoy Finlay, RN sent at 10/26/2018  1:04 PM EDT -----  ----- Message from Georgina Snell sent at 10/26/2018  9:07 AM EDT -----  Pt left message asking if her PT/INR results are back?

## 2018-10-26 NOTE — Telephone Encounter (Signed)
Alexandra Stammer Amy, RN  [name], [place], [place]            CALL HER    Previous Messages      ----- Message -----   From: Oris Drone   Sent: 10/26/2018 11:53 AM EDT   To: Hoy Finlay, RN     She can start Eliquis now.     Thanks,   Samuella Bruin, PA-C 10/26/2018, 11:53   ----- Message -----   From: Hoy Finlay, RN   Sent: 10/26/2018  9:45 AM EDT   To: Samuella Bruin, PA-C     Based on the pt/inr I scanned in, can she start Eliquis?         I phoned patient she states she was already notified by Dr. Beaulah Corin office about starting Eliquis and that patient has already started. Patient verbalized understanding and does not have any questions or concerns at this time.    [place], [place]  10/26/2018, 13:09

## 2018-10-27 ENCOUNTER — Encounter (HOSPITAL_BASED_OUTPATIENT_CLINIC_OR_DEPARTMENT_OTHER): Payer: Self-pay | Admitting: Obstetrics & Gynecology

## 2018-10-28 ENCOUNTER — Encounter (INDEPENDENT_AMBULATORY_CARE_PROVIDER_SITE_OTHER): Payer: Self-pay | Admitting: Medical

## 2018-11-06 ENCOUNTER — Other Ambulatory Visit (INDEPENDENT_AMBULATORY_CARE_PROVIDER_SITE_OTHER): Payer: Self-pay | Admitting: Medical

## 2018-11-11 LAB — HISTORICAL CYTOPATHOLOGY-GYN (PAP AND HPV TESTS)

## 2018-11-15 ENCOUNTER — Telehealth (INDEPENDENT_AMBULATORY_CARE_PROVIDER_SITE_OTHER): Payer: Self-pay | Admitting: Medical

## 2018-11-15 NOTE — Telephone Encounter (Addendum)
-----   Message from [place], [place] sent at 11/08/2018  9:59 AM EDT -----  She was supposed to have a repeat BMP 1 week after her office visit with Caryl Pina. Can you see if this was completed?  Ashlee O'Kernick, PA-C  11/08/2018, 09:59      Called pt lmom ks 11/08/18      Phoned patient LM with call back number.  Youlanda Roys, LPN  1/43/8887, 57:97

## 2018-11-16 ENCOUNTER — Encounter (INDEPENDENT_AMBULATORY_CARE_PROVIDER_SITE_OTHER): Payer: Self-pay | Admitting: INTERNAL MEDICINE CARDIOVASCULAR DISEASE

## 2018-11-16 ENCOUNTER — Other Ambulatory Visit: Payer: Self-pay

## 2018-11-16 ENCOUNTER — Other Ambulatory Visit (INDEPENDENT_AMBULATORY_CARE_PROVIDER_SITE_OTHER): Payer: Self-pay | Admitting: INTERNAL MEDICINE CARDIOVASCULAR DISEASE

## 2018-11-16 ENCOUNTER — Ambulatory Visit (INDEPENDENT_AMBULATORY_CARE_PROVIDER_SITE_OTHER): Payer: Commercial Managed Care - PPO | Admitting: INTERNAL MEDICINE CARDIOVASCULAR DISEASE

## 2018-11-16 VITALS — BP 132/71 | HR 84 | Resp 18 | Ht 64.0 in | Wt 348.8 lb

## 2018-11-16 DIAGNOSIS — I480 Paroxysmal atrial fibrillation: Secondary | ICD-10-CM

## 2018-11-16 DIAGNOSIS — E669 Obesity, unspecified: Secondary | ICD-10-CM

## 2018-11-16 DIAGNOSIS — G4733 Obstructive sleep apnea (adult) (pediatric): Secondary | ICD-10-CM

## 2018-11-16 NOTE — Nursing Note (Signed)
Pt did not have a med list with them at visit - went over list with patient   Advised patient to check AVS list with medicines at home and call if any discrepancies    -Confirmed correct pharmacy with patient for e-scribing     [place], [place]  11/16/2018, 14:24

## 2018-11-16 NOTE — Progress Notes (Addendum)
[facility]  [address]  [place] [address]  Phone: [phone]  Fax: [phone]    Encounter Date: 11/16/2018    Patient ID:  Alexandra Nunez  MVH:[id number]    DOB: 1956-03-20  Age: 63 y.o. female    Subjective:     Chief Complaint   Patient presents with   . Follow-up   . Irregular Heart Beat   . Shortness of Breath     63 year old lady who has a history of persistent atrial fibrillation and morbid obesity. She underwent synchronized cardioversion at [facility] on 09/21/17 for her symptomatic atrial fibrillation and converted to normal sinus rhythm with 200J x1.   Cardizem was reduced from 360mg  qam and 120mg  qpm to 360mg  qd, and she was started on Amiodarone 200mg  twice a day for one week that was then to be reduced to 200mg  once daily. She subsequently discontinued this medicine. A transesophageal echocardiogram was completed on 10/01/17 and showed preserved EF at 60%, no significant valvular abnormalities and adequate size of LA appendage with no smoke or thrombus. She remained in sinus rhythm at the time of her TEE. She has been on thromboembolic prophylaxis with Coumadin with a goal INR between 2-3.   In January of 2020, she felt like she went back into atrial fibrillation. She was referred to the Afib clinic in [place] on 05/03/2018 for consideration for atrial fibrillation ablation, but she has not been able to see them.   Due to continued symptoms, she was scheduled for another DCCV at [facility] on 07/01/2018 with successful return to NSR s/p 200J delivered x1.     She is not get her appointment cardiac EP not as she had the sleep study done.  She is symptomatic with AFib and today she is here to request a repeat cardioversion.      Irregular Heart Beat    This is a recurrent problem. The current episode started more than 1 month ago. The problem occurs intermittently. The problem has been unchanged. Associated symptoms include an irregular heartbeat and shortness of breath. Pertinent  negatives include no anxiety, chest pain, dizziness, malaise/fatigue or syncope. Treatments tried: cardioversion x 2. There is no history of anemia or anxiety.   Shortness of Breath   This is a chronic problem. The current episode started more than 1 month ago. The problem occurs daily. Pertinent negatives include no chest pain or syncope.     Current Outpatient Medications   Medication Sig   . acetaminophen (TYLENOL 8 HOUR) 650 mg Oral Tablet Sustained Release Take 1,300 mg by mouth Twice daily   . albuterol sulfate (PROVENTIL OR VENTOLIN OR PROAIR) 90 mcg/actuation Inhalation HFA Aerosol Inhaler Take 2 Puffs by inhalation Every 6 hours as needed   . budesonide-formoterol (SYMBICORT) 160-4.5 mcg/actuation Inhalation HFA Aerosol Inhaler Take 2 Puffs by inhalation Twice daily   . dilTIAZem (CARDIZEM CD) 240 mg Oral Capsule, Sust. Release 24 hr Take 240 mg by mouth Once a day   . ELIQUIS 5 mg Oral Tablet TK 1 T PO BID   . esomeprazole magnesium (NEXIUM) 20 mg Oral Capsule, Delayed Release(E.C.) Take 20 mg by mouth Once a day    . fluticasone propionate (FLONASE) 50 mcg/actuation Nasal Spray, Suspension 2 Sprays by Each Nostril route Once per day as needed    . furosemide (LASIX) 40 mg Oral Tablet TAKE 1 TABLET BY MOUTH EVERY DAY (Patient taking differently: Take 60 mg by mouth Once a day Takes 1 1/2 tablet  daily)   . Ibuprofen (MOTRIN) 200 mg Oral Tablet Take 200 mg by mouth Three times a day   . losartan (COZAAR) 50 mg Oral Tablet Take 1 Tab (50 mg total) by mouth Once a day   . melatonin 5 mg Oral Tablet, Chewable Take 5 mg by mouth Every night    . metoprolol succinate (TOPROL-XL) 25 mg Oral Tablet Sustained Release 24 hr Take 1 Tab (25 mg total) by mouth Once a day     Allergies   Allergen Reactions   . Chocolate [Cocoa]    . Ciprofloxacin    . Prednisone      High doses  Other reaction(s): Hypertension (intolerance), Other (See Comments), Other (See Comments)  Increased BP  Raises her blood pressure     .  Protonix [Pantoprazole]    . Sulfa (Sulfonamides)      Past Medical History:   Diagnosis Date   . Asthma    . Atrial fibrillation (CMS HCC)    . Borderline diabetes    . Chronic joint pain    . CKD (chronic kidney disease) stage 3, GFR 30-59 ml/min (CMS HCC)    . Essential hypertension    . GERD (gastroesophageal reflux disease)    . Knee pain, bilateral    . Obesity    . Osteoarthritis    . Uterine cancer (CMS [facility])          Past Surgical History:   Procedure Laterality Date   . CARDIOVERSION     . COLONOSCOPY     . HX APPENDECTOMY     . HX DILATION AND CURETTAGE     . HX HYSTERECTOMY     . LAPAROSCOPIC TOTAL HYSTERECTOMY           Family Medical History:     Problem Relation (Age of Onset)    Coronary Artery Disease Mother, Father    HTN <20 y.o. Mother, Father            Social History     Tobacco Use   . Smoking status: Never Smoker   . Smokeless tobacco: Never Used   Substance Use Topics   . Alcohol use: Never     Frequency: Never   . Drug use: Never       Review of Systems   Constitutional: Negative for malaise/fatigue.   Respiratory: Positive for shortness of breath.    Cardiovascular: Positive for palpitations. Negative for chest pain and syncope.   Neurological: Negative for dizziness and syncope.   Psychiatric/Behavioral: The patient is not nervous/anxious.    All other systems reviewed and are negative.    Objective:   Vitals: BP 132/71   Pulse 84   Resp 18   Ht 1.626 m (5\' 4" )   Wt (!) 158 kg (348 lb 12.8 oz)   SpO2 95%   BMI 59.87 kg/m         Physical Exam  Vitals signs and nursing note reviewed.   Constitutional:       Appearance: She is well-developed.   HENT:      Head: Normocephalic.   Eyes:      Pupils: Pupils are equal, round, and reactive to light.   Neck:      Musculoskeletal: Normal range of motion.      Vascular: No JVD.   Cardiovascular:      Rate and Rhythm: Normal rate and regular rhythm.      Heart sounds: Normal heart sounds. No murmur.  Pulmonary:      Effort: Pulmonary effort is  normal.      Breath sounds: Normal breath sounds.   Abdominal:      General: Bowel sounds are normal.      Palpations: Abdomen is soft.   Musculoskeletal: Normal range of motion.         General: No deformity.   Skin:     General: Skin is warm and dry.   Neurological:      Mental Status: She is alert and oriented to person, place, and time.      Cranial Nerves: No cranial nerve deficit.         Assessment & Plan:     ENCOUNTER DIAGNOSES     ICD-10-CM   1. PAF (paroxysmal atrial fibrillation) (CMS HCC) I48.0   2. Paroxysmal atrial fibrillation (CMS HCC) I48.0   3. Obesity, unspecified classification, unspecified obesity type, unspecified whether serious comorbidity present E66.9   4. Obstructive sleep apnea G47.33       Atrial Fibrillation / Cardioversion   Discussed rate control measures with the patient, patient would like to proceed with cardioversion.  With discussed risks and benefits explained TEE cardioversion.  Complications include sedation complications, issues related to probe placement, failure of cardioversion, unmasking underlying bradycardia, and stroke.  Patient verbalized understanding and agreed to proceed with this.  Atrial Fibrillation  Patient is AFib with [place] score as noted above, patient on stroke prevention measures, patient is aware of alternatives including Coumadin and NOACS and Watchman device.  For preoperative purposes patient is aware of slight increase in stroke risk. Stop antigoagulant as per manufacturer reccomendations to hold at least two half lives prior to procedure and resume when cleared by physician doing the procedure.  Sleep Apnea  Multiple features of sleep apnea,   Please refer to prior note for, Epsworth score  patient agreed to proceed with sleep study and referral to sleep medicine if needed.    Orders Placed This Encounter   . Refer to [facility]   . ECG WITH INTERPRETATION/REPORT (AMB ONLY)   . POLYSOMNOGRAPHY - SLEEP STUDY - UNATTENDED     This note  may have been partially generated using MModal Fluency Direct system, and there may be some incorrect words, spellings, and punctuation that were not noted in checking the note before saving.    Return in about 3 months (around 02/16/2019).    Leo Grosser, MD

## 2018-11-24 ENCOUNTER — Ambulatory Visit (HOSPITAL_COMMUNITY): Payer: Self-pay | Admitting: Family

## 2018-11-29 ENCOUNTER — Ambulatory Visit (INDEPENDENT_AMBULATORY_CARE_PROVIDER_SITE_OTHER): Payer: Self-pay | Admitting: INTERNAL MEDICINE CARDIOVASCULAR DISEASE

## 2018-11-29 NOTE — Telephone Encounter (Signed)
Contacted patient and provided the below instructions.    Patient ordered cardioversion .Dr. Deatra Canter  to be the physician to perform the procedure.    Patient is scheduled for 11/30/2018 @ 11 AM for PRE-ADMISSION appointment patient was instructed to arrive @ 10:30 AM on the Ground floor of [facility] hospital registration desk.    Patient scheduled for 12/02/2018  and instructed to report to registration on the Ground floor of [facility] hospital registration desk.    Patient aware that someone will call them the afternoon/evening before to confirm time  of arrival and procedure.    Patient will be NPO at midnight.     Patient will have someone to drive them home.      Reviewed medication list. Patient is to hold oral diabetic medication and diuretics  Day  of procedure.      The patient may take all other meds with a sip of water, including aspirin and plavix.      [place], [place]  11/29/2018, 09:40

## 2018-12-02 DIAGNOSIS — I4891 Unspecified atrial fibrillation: Secondary | ICD-10-CM

## 2018-12-03 ENCOUNTER — Other Ambulatory Visit (INDEPENDENT_AMBULATORY_CARE_PROVIDER_SITE_OTHER): Payer: Self-pay

## 2018-12-03 DIAGNOSIS — I480 Paroxysmal atrial fibrillation: Secondary | ICD-10-CM

## 2018-12-08 ENCOUNTER — Ambulatory Visit (HOSPITAL_COMMUNITY): Payer: Self-pay | Admitting: Family

## 2018-12-20 ENCOUNTER — Other Ambulatory Visit (INDEPENDENT_AMBULATORY_CARE_PROVIDER_SITE_OTHER): Payer: Self-pay | Admitting: Medical

## 2018-12-20 MED ORDER — METOPROLOL SUCCINATE ER 25 MG TABLET,EXTENDED RELEASE 24 HR
25.0000 mg | ORAL_TABLET | Freq: Every day | ORAL | 3 refills | Status: DC
Start: 2018-12-20 — End: 2019-03-21

## 2018-12-21 ENCOUNTER — Ambulatory Visit (HOSPITAL_COMMUNITY): Payer: Self-pay | Admitting: Family

## 2019-01-06 ENCOUNTER — Encounter (HOSPITAL_COMMUNITY): Payer: Self-pay | Admitting: Family

## 2019-01-27 ENCOUNTER — Ambulatory Visit (INDEPENDENT_AMBULATORY_CARE_PROVIDER_SITE_OTHER): Payer: Commercial Managed Care - PPO

## 2019-01-27 ENCOUNTER — Encounter (INDEPENDENT_AMBULATORY_CARE_PROVIDER_SITE_OTHER): Payer: Self-pay

## 2019-01-27 ENCOUNTER — Other Ambulatory Visit: Payer: Self-pay

## 2019-01-27 VITALS — BP 144/62 | HR 52 | Ht 64.0 in | Wt 337.8 lb

## 2019-01-27 DIAGNOSIS — Z9289 Personal history of other medical treatment: Secondary | ICD-10-CM | POA: Insufficient documentation

## 2019-01-27 DIAGNOSIS — Z9889 Other specified postprocedural states: Secondary | ICD-10-CM

## 2019-01-27 DIAGNOSIS — I10 Essential (primary) hypertension: Secondary | ICD-10-CM

## 2019-01-27 DIAGNOSIS — Z7901 Long term (current) use of anticoagulants: Secondary | ICD-10-CM

## 2019-01-27 DIAGNOSIS — R001 Bradycardia, unspecified: Secondary | ICD-10-CM

## 2019-01-27 DIAGNOSIS — I4891 Unspecified atrial fibrillation: Secondary | ICD-10-CM

## 2019-01-27 MED ORDER — HYDRALAZINE 25 MG TABLET
25.00 mg | ORAL_TABLET | Freq: Three times a day (TID) | ORAL | 5 refills | Status: AC
Start: 2019-01-27 — End: ?

## 2019-01-27 MED ORDER — LOSARTAN 50 MG TABLET
100.0000 mg | ORAL_TABLET | Freq: Every day | ORAL | 3 refills | Status: DC
Start: 2019-01-27 — End: 2020-01-02

## 2019-01-27 NOTE — Progress Notes (Signed)
[place]  [address]  [place] [address]  Phone: [phone]  Fax: [phone]    Date: 01/27/2019   Patient: Alexandra Nunez           DOB: July 20, 1955  PCP: Arley Phenix, MD                   MRN: [id number]    Chief Complaint   Patient presents with   . Hypertension   . No Complaints     SUBJECTIVE:    Alexandra Nunez is a 63 y.o. year old female here for follow up.  History of persistent atrial fibrillation and morbid obesity.  - First successful cardioversion 09/21/2017.  - TEE 10/01/17:  EF 60%, no significant valvular abnormalities  - Repeat cardioversion 07/01/2018 with successful restoration of sinus rhythm    Patient last seen in clinic 11/16/2018 with another symptomatic recurrence of atrial fibrillation.  She was very short of breath.  Continually cancelling her EP appointments.  Recommended to get sleep study.  Underwent 3rd cardioversion with Dr. Deatra Canter 11/30/2018.  Sinus rhythm was restored at that time.    Of note, patient was ordered a 30 day event monitor and Lexiscan MPS stress this past year; however, does not appear either of these tests were completed.    Patient called the clinic requesting an urgent visit today with complaints of elevated blood pressure for the last 1-2 weeks.  Blood pressure initially 201/76 upon presentation to clinic; recheck 144/62.  Ranging 150's-190s/80s-110s consistently at home.  Catheterization has been verified accurate.  Patient reports that when blood pressure is high she is experiencing headaches and blurred vision.    She denies any chest pain.  Reports 2 episodes of atrial fibrillation since her last cardioversion.  Symptoms lasted 30-60 minutes and resolved with rest.  Stable, chronic dyspnea on exertion secondary to asthma.  Inhalers help improve symptoms.  She denies any dizziness, syncope, or worsening edema.  EKG shows sinus bradycardia in clinic today but patient denies any fatigue or weakness.  She does monitor heart  rate at home and states average values 60s-70s; however, she started recently taking a higher dose of melatonin and noticed that is what started her heart rate going to the 50s.  She will stop 10 mg of melatonin nightly and go back to her previous dose of 5 mg but otherwise voices no further concerns.    ROS  Pertinent positives per HPI.  All other ROS Negative     Current Outpatient Medications   Medication Sig   . acetaminophen (TYLENOL 8 HOUR) 650 mg Oral Tablet Sustained Release Take 1,300 mg by mouth Twice daily   . albuterol sulfate (PROVENTIL OR VENTOLIN OR PROAIR) 90 mcg/actuation Inhalation HFA Aerosol Inhaler Take 2 Puffs by inhalation Every 6 hours as needed   . budesonide-formoterol (SYMBICORT) 160-4.5 mcg/actuation Inhalation HFA Aerosol Inhaler Take 2 Puffs by inhalation Twice daily   . dilTIAZem (CARDIZEM CD) 240 mg Oral Capsule, Sust. Release 24 hr Take 240 mg by mouth Once a day   . ELIQUIS 5 mg Oral Tablet TK 1 T PO BID   . esomeprazole magnesium (NEXIUM) 20 mg Oral Capsule, Delayed Release(E.C.) Take 20 mg by mouth Once a day    . fluticasone propionate (FLONASE) 50 mcg/actuation Nasal Spray, Suspension 2 Sprays by Each Nostril route Once per day as needed    . furosemide (LASIX) 40 mg Oral Tablet TAKE 1 TABLET BY MOUTH EVERY  DAY (Patient taking differently: Take 60 mg by mouth Once a day Takes 1 1/2 tablet daily)   . hydrALAZINE (APRESOLINE) 25 mg Oral Tablet Take 1 Tab (25 mg total) by mouth Three times a day   . Ibuprofen (MOTRIN) 200 mg Oral Tablet Take 200 mg by mouth Three times a day   . losartan (COZAAR) 50 mg Oral Tablet Take 2 Tabs (100 mg total) by mouth Once a day   . melatonin 5 mg Oral Tablet, Chewable Take 5 mg by mouth Every night    . metoprolol succinate (TOPROL-XL) 25 mg Oral Tablet Sustained Release 24 hr Take 1 Tab (25 mg total) by mouth Once a day      Patient Active Problem List    Diagnosis Date Noted   . Morbid obesity (CMS [place]) 01/27/2019   . History of cardioversion  01/27/2019     09/21/17, 07/01/18, 11/30/18     . bilateral knee pain to both legs 12/02/2017   . Atrial fibrillation with rapid ventricular response (CMS HCC) 07/23/2017   . CKD (chronic kidney disease) stage 3, GFR 30-59 ml/min 07/06/2017   . Atrial fibrillation (CMS HCC) 07/06/2017   . Essential hypertension 07/06/2017   . Osteoarthritis 07/06/2017   . Uterine cancer (CMS [place]) 07/06/2017     OBJECTIVE: Blood pressure (!) 144/62, pulse 52, height 1.626 m (5\' 4" ), weight (!) 153 kg (337 lb 12.8 oz), SpO2 95 %, not currently breastfeeding.   Physical Exam   General: No acute distress. Appears older than stated age. Patient in a wheelchair. Morbidly obese.  Respiratory: Clear to auscultation bilaterally. No rhonchi, rales, wheezing.  Cardiovascular: Loletha Grayer, regular. No murmurs. No ectopy.  Abdomen: Soft, nontender, nondistended with normal active bowel sounds.  Extremities: 1+ bilateral pedal edema.  Skin: Warm and dry. Chronic wounds to BLE  Neuro: Grossly normal. Patient alert. Answers questions appropriately. No focal neurologic deficits.     Labs/Studies: Outside labs/studies/records reviewed with patient.      EKG obtained during appointment today & interpreted by Dr. Deatra Canter:  - sinus bradycardia, ventricular rate 50    ASSESSMENT/PLAN:      1. Essential hypertension    2. Atrial fibrillation (CMS HCC)    3. History of cardioversion    4. Sinus bradycardia    5. Chronic anticoagulation        1.  Symptomatic hypertensive episodes with highest recording 190s/110s.  Patient endorses headaches and blurred vision.  Initial blood pressure today 201/76.  Add hydralazine 25 mg t.i.d..  Patient will continue Lasix 60 mg daily, Cardizem 240 mg daily, losartan 100 mg daily, and Toprol 25 mg daily.  Patient will continue monitoring blood pressures at home.  Parameters given with which to contact our office.  She will keep previously scheduled appointment with Dr. Deatra Canter for close follow-up 02/16/2019.  2.  Atrial fibrillation s/p  cardioversion x3, most recent 11/30/18.  Patient endorses 2 episodes of atrial fibrillation since last procedure.  Episodes lasted 30-60 minutes and resolved with rest.  Patient maintaining sinus bradycardia per physical exam and EKG assessment today.  She is asymptomatic in respects to her bradycardia.  States average heart rate at home is 60s-70s; however, has noticed it dropped to the 50s since she started taking 10 mg melatonin nightly.  She will go back down to 5 mg nightly.  - Patient was instructed to continue monitoring heart rates at home and call our office if bradycardia persists.  As stated, she is asymptomatic.  She is very adamantly against changing any of her rate control medications secondary to fear of recurrent atrial fibrillation episodes.  She will continue Cardizem 240 mg daily and Toprol 25 mg daily.  - on Eliquis for anticoagulation.  Tolerating well without S/S bleeding.    Discussed with patient previous orders for Lexiscan MPS and 30 day event monitor.  Confirmed in the medical record these were not completed.  She declines any need for further cardiac testing at this time.    Refer to patient instructions/orders for further details regarding plan of care.  Patient Instructions   Start Hydralazine 25 mg three times a day.  New prescription sent to your pharmacy.    No other changes to medications today.    Keep checking BP at home and call our office if values are consistently out of range.  < 100/50 or > 140/80    Keep appointment with Dr. Deatra Canter 02/16/19 but call us sooner if you are having problems.      Orders Placed This Encounter   . ECG WITH INTERPRETATION/REPORT (AMB ONLY)   . losartan (COZAAR) 50 mg Oral Tablet   . hydrALAZINE (APRESOLINE) 25 mg Oral Tablet        Return in about 20 days (around 02/16/2019).  Sooner PRN.    Patient seen independently.        Candis Schatz, APRN,NP-C   [facility]

## 2019-01-27 NOTE — Patient Instructions (Addendum)
Start Hydralazine 25 mg three times a day.  New prescription sent to your pharmacy.    No other changes to medications today.    Keep checking BP at home and call our office if values are consistently out of range.  < 100/50 or > 140/80    Keep appointment with Dr. Deatra Canter 02/16/19 but call us sooner if you are having problems.

## 2019-01-27 NOTE — Nursing Note (Signed)
Pt did not have a med list with them at visit - went over list with patient   Advised patient to check AVS list with medicines at home and call if any discrepancies    -Confirmed correct pharmacy with patient for e-scribing   Excell Seltzer, MA  01/27/2019, 14:43

## 2019-02-10 ENCOUNTER — Encounter (HOSPITAL_COMMUNITY): Payer: Self-pay | Admitting: Family

## 2019-02-10 ENCOUNTER — Other Ambulatory Visit: Payer: Self-pay

## 2019-02-10 ENCOUNTER — Ambulatory Visit: Payer: Commercial Managed Care - PPO | Attending: Family | Admitting: Family

## 2019-02-10 DIAGNOSIS — Z79899 Other long term (current) drug therapy: Secondary | ICD-10-CM | POA: Insufficient documentation

## 2019-02-10 DIAGNOSIS — Z7901 Long term (current) use of anticoagulants: Secondary | ICD-10-CM | POA: Insufficient documentation

## 2019-02-10 DIAGNOSIS — I4819 Other persistent atrial fibrillation: Secondary | ICD-10-CM | POA: Insufficient documentation

## 2019-02-10 NOTE — H&P (Signed)
PATIENT NAME:Alexandra Nunez  DATE OF BIRTH: 1955/06/13  HOSPITAL NUMBER: [id number]  DATE OF SERVICE: 02/10/2019    ATRIAL FIBRILLATION CENTER  ELECTROPHYSIOLOGY CONSULTATION    History of Present Illness  Alexandra Nunez is a 63 y.o. female who was referred by Leo Grosser, MD. Patient resides in [place] in [place] [id number].  Presenting with a chief complaint of persistent atrial fibrillation.   Patient reports initial diagnosis in  February 2011. She had presented with palpitations, dyspnea and dizziness. She follows with Dr Deatra Canter in [place]. Event monitor and MPS ordered but not completed. She reports remote history of echo and stress test prior to moving back to [facility]. She reports inconclusive sleep test. She has undergone multiple cardioversion in the past year. She reports that she maintains sinus rhythm for months after cardioversion. She has never been on an antiarrhythmic medication. She has had intermittent self terminating episodes in the past few months. No bleeding issues on Eliquis.   Patient reports the most significant symptom of atrial fibrillation is generalized weakness, she also experiences palpitations and dyspnea. She believes that most episode occur at night or early morning.   She denies symptoms of atrial fibrillation at this time.   Past History  Current Outpatient Medications   Medication Sig   . acetaminophen (TYLENOL 8 HOUR) 650 mg Oral Tablet Sustained Release Take 1,300 mg by mouth Twice daily   . albuterol sulfate (PROVENTIL OR VENTOLIN OR PROAIR) 90 mcg/actuation Inhalation HFA Aerosol Inhaler Take 2 Puffs by inhalation Every 6 hours as needed   . budesonide-formoterol (SYMBICORT) 160-4.5 mcg/actuation Inhalation HFA Aerosol Inhaler Take 2 Puffs by inhalation Twice daily   . dilTIAZem (CARDIZEM CD) 240 mg Oral Capsule, Sust. Release 24 hr Take 240 mg by mouth Once a day   . ELIQUIS 5 mg Oral Tablet TK 1 T PO BID   . esomeprazole magnesium (NEXIUM) 20 mg Oral Capsule, Delayed Release(E.C.) Take 20  mg by mouth Once a day    . fluticasone propionate (FLONASE) 50 mcg/actuation Nasal Spray, Suspension 2 Sprays by Each Nostril route Once per day as needed    . furosemide (LASIX) 40 mg Oral Tablet TAKE 1 TABLET BY MOUTH EVERY DAY (Patient taking differently: Take 60 mg by mouth Once a day Takes 1 1/2 tablet daily)   . hydrALAZINE (APRESOLINE) 25 mg Oral Tablet Take 1 Tab (25 mg total) by mouth Three times a day   . Ibuprofen (MOTRIN) 200 mg Oral Tablet Take 200 mg by mouth Three times a day   . losartan (COZAAR) 50 mg Oral Tablet Take 2 Tabs (100 mg total) by mouth Once a day   . melatonin 5 mg Oral Tablet, Chewable Take 5 mg by mouth Every night    . metoprolol succinate (TOPROL-XL) 25 mg Oral Tablet Sustained Release 24 hr Take 1 Tab (25 mg total) by mouth Once a day     Allergies   Allergen Reactions   . Chocolate [Cocoa]    . Ciprofloxacin    . Prednisone      High doses  Other reaction(s): Hypertension (intolerance), Other (See Comments), Other (See Comments)  Increased BP  Raises her blood pressure     . Protonix [Pantoprazole]    . Sulfa (Sulfonamides)      Past Medical History:   Diagnosis Date   . Asthma    . Atrial fibrillation (CMS HCC)    . Borderline diabetes    . Chronic joint pain    . CKD (  chronic kidney disease) stage 3, GFR 30-59 ml/min    . Essential hypertension    . GERD (gastroesophageal reflux disease)    . Knee pain, bilateral    . Obesity    . Osteoarthritis    . Uterine cancer (CMS [facility])          Past Surgical History:   Procedure Laterality Date   . CARDIOVERSION     . COLONOSCOPY     . HX APPENDECTOMY     . HX DILATION AND CURETTAGE     . HX HYSTERECTOMY     . LAPAROSCOPIC TOTAL HYSTERECTOMY           Family History  Family Medical History:     Problem Relation (Age of Onset)    Coronary Artery Disease Mother, Father    HTN <20 y.o. Mother, Father            Social History  Social History     Socioeconomic History   . Marital status: Married     Spouse name: Not on file   . Number of  children: Not on file   . Years of education: Not on file   . Highest education level: Not on file   Tobacco Use   . Smoking status: Never Smoker   . Smokeless tobacco: Never Used   Substance and Sexual Activity   . Alcohol use: Never     Frequency: Never   . Drug use: Never     Review of Systems  Review of systems is negative except as discussed per HPI  Examination  BP (!) 177/72   Pulse 55   Resp 18   Ht 1.626 m (5\' 4" )   Wt (!) 152 kg (334 lb)   SpO2 94%   BMI 57.33 kg/m       Constitutional - No acute distress, Well nourished, Well Developed.  Head - Normocephalic, Atraumatic no lesions noted  Eyes - Nonicteric       Lungs - Normal respiratory effort.    Neuro - A&O to person, place and time.  Answers all questions appropriately.      CHADsVASC=3   C Congestive Heart Failure (1 point): 0  H Hypertension -(BP>140/90 x 2 occasions or on current tx)(1 point) : 1  A Age 35 or over (2 points):0  D Diabetes mellitus (1 point):1  S Stroke/TIA/thromboembolism (history of) (2 points):0  V Vascular disease (prior MI, PAD or aortic plaque)(1 point):0  A Age 81-74 (1 point): 0  S Sex Category (female)( 1 point): 1    HAS-BLED=0  H Hypertension (uncontrolled BP, .0000000 systolic)(1 point):0  A Abnormal renal(dialysis,transplant,Cr >2.26mg /dL)/liver function (cirrhosis or bilirubin>2x normal with AST/ALT >3xnormal)(1 or 2 point):0  S Stroke(1 point):0  B Bleeding tendency or predisposition(1 point):0  L Labile INR (1 point):0  A Age (>65) (1 point):0  D Drugs (Plavix,aspirin or NSAIDs or Alcohol>8/wk)(1 or 2 point):0    Data Reviewed  EKG:   Monitor:   Labs:   Sleep Study:   Echo: No results found for this or any previous visit (from the past 2400 hour(s)). 08/2017 1. Normal left ventricular size. Normal left ventricular ejection fraction. LV Ejection Fraction is 67 %.  Normal geometry. Left ventricular diastolic parameters were normal.  2. Normal right ventricular size. RV Systolic Pressure is normal.  3. There is mild  primary mitral valve regurgitation.  4. There is mild tricuspid regurgitation.  Ischemia workup: No results found for this or any previous  visit.      Diagnosis     1. Persistent atrial fibrillation (CMS HCC)        Discussion  We had a prolonged discussion with the patient about atrial fibrillation. This included was a discussion of "causes," potential reversible contributing factors, and goals of therapy (symptom control, stroke risk reduction, CHF prevention), rate-control management, rhythm-control management, anticoagulation. In addition, we discussed therapeutic options (continue present treatment, try/different AA drug, ablation).     For modifiable risk factors, we recommended weight reduction, at minimum 10% of body weight. It was explained that untreated OSA reduces chance of success with all therapies, but treatment of OSA does not guarantee success with any therapy.     Today we reviewed goals of treating AF. Symptom burden is currently low. Intermittent episodes of persistent atrial fibrillation. Encouraged to follow with sleep medicine for high suspicion of OSA contributing to AF. Significant weight loss encouraged. She is not interested in AAD. Ablation was discussed however lifestyle modification were recommended first.     Follow up in 6 months or sooner if needed.     Plan  No orders of the defined types were placed in this encounter.      1. Lifestyle modifications, which were discussed in detail today  2. Thromboembolic prophylaxis with Eliquis  3. Rate/Rhythm control with Toprol and Diltiazem    Jeanice Lim APRN, FNP-BC 02/10/2019, 11:18    Jeanice Lim, APRN,FNP-BC  The patient was seen independently with supervising physician not present but available for consultation.

## 2019-02-10 NOTE — Patient Instructions (Signed)
Goals of treating atrial fibrillation  1. Prevent Stroke-Eliquis  2. Prevent heart failure-by controlling heart rate with Toprol and diltiazem  3. Symptoms    Lifestyle Modifications  Weight loss- 10% minimum  Treat sleep apnea, could help treatments for AF

## 2019-02-10 NOTE — Nursing Note (Signed)
Pt did not have a med list with them at visit - went over list with patient   Advised patient to check AVS list with medicines at home and call if any discrepancies    -Confirmed correct pharmacy with patient for e-scribing     Kathlynn Grate, Ambulatory Care Assistant  02/10/2019, 11:15

## 2019-02-16 ENCOUNTER — Encounter (INDEPENDENT_AMBULATORY_CARE_PROVIDER_SITE_OTHER): Payer: Self-pay | Admitting: INTERNAL MEDICINE CARDIOVASCULAR DISEASE

## 2019-02-16 ENCOUNTER — Ambulatory Visit (INDEPENDENT_AMBULATORY_CARE_PROVIDER_SITE_OTHER): Payer: Medicare PPO | Admitting: Internal Medicine

## 2019-02-16 ENCOUNTER — Other Ambulatory Visit: Payer: Self-pay | Admitting: Internal Medicine

## 2019-02-16 ENCOUNTER — Encounter: Payer: Self-pay | Admitting: Internal Medicine

## 2019-02-16 ENCOUNTER — Telehealth: Payer: Self-pay | Admitting: Internal Medicine

## 2019-02-16 DIAGNOSIS — J4540 Moderate persistent asthma, uncomplicated: Secondary | ICD-10-CM | POA: Diagnosis not present

## 2019-02-16 DIAGNOSIS — R05 Cough: Secondary | ICD-10-CM | POA: Insufficient documentation

## 2019-02-16 DIAGNOSIS — R058 Other specified cough: Secondary | ICD-10-CM | POA: Insufficient documentation

## 2019-02-16 MED ORDER — PREDNISONE 10 MG PO TABS
ORAL_TABLET | ORAL | 11 refills | Status: DC
Start: 2019-02-16 — End: 2019-10-27

## 2019-02-16 MED ORDER — ALBUTEROL SULFATE HFA 108 (90 BASE) MCG/ACT IN AERS: 2.0000 | INHALATION_SPRAY | RESPIRATORY_TRACT | 3 refills | Status: DC | PRN

## 2019-02-16 MED ORDER — BUDESONIDE-FORMOTEROL FUMARATE 160-4.5 MCG/ACT IN AERO: 2.0000 | INHALATION_SPRAY | Freq: Two times a day (BID) | RESPIRATORY_TRACT | 3 refills | Status: DC

## 2019-02-16 MED ORDER — BUDESONIDE-FORMOTEROL FUMARATE 160-4.5 MCG/ACT IN AERO: 2.0000 | INHALATION_SPRAY | Freq: Two times a day (BID) | RESPIRATORY_TRACT | 11 refills | Status: DC

## 2019-02-16 NOTE — Assessment & Plan Note (Signed)
Trial of 1st gen H1 blockers per guidelines  02/16/2019 >>>   Allergy studies unrmarkable, reminded to blow symb 160 out thru nose - could add singulair if still a problem and hasn't tried it.

## 2019-02-16 NOTE — Assessment & Plan Note (Signed)
Onset childhood  - rec trial off acei 08/24/2013 and again 07/01/2016 x 3 weeks only - Spirometry 07/01/2016  FEV1 1.21 (47%)  Ratio 69 mild curvature  - FENO 07/01/2016  =   39  - Allergy profile 08/01/2016 >  Eos 0.5 /  IgE  12 neg RAST  - 09/28/16 confirmed much better off acei (see email)  - 11/07/2016    try symbicort 80 2bid/ depomedrol 120 - 04/22/2017  After extensive coaching inhaler device  effectiveness =    75% > try symb 160 and avoid pred if possible / add gerd rx = aciphex as can't tol protonix - 11/13/2017 flared off ppi > resume plus pred x 6 days if not improving on ppi    All goals of chronic asthma control met including optimal function and elimination of symptoms with minimal need for rescue therapy.  Contingencies discussed in full including contacting this office immediately if not controlling the symptoms using the rule of two's.     No change rx needed  > not coming back to [place] anytime soon so will give pred to have on hand x 8 day cycle and set up for televist q 82m  Each maintenance medication was reviewed in detail including most importantly the difference between maintenance and as needed and under what circumstances the prns are to be used.  Please see AVS for specific  Instructions which are unique to this visit and I personally typed out  which were reviewed in detail in writing with the patient and a copy provided via MyChart

## 2019-02-16 NOTE — Telephone Encounter (Signed)
[employer] : member #  [id number]            Group # [id number]  RXBIN: [id number] RX [id number]: [id number] RX GRP: [id number]  Physician RX: [phone] Provider line: [phone] I asked pt if she could upload card to [place] and she stated that she would try to do that now.

## 2019-02-16 NOTE — Telephone Encounter (Signed)
Patrice- not sure what to do with this insurance information

## 2019-02-16 NOTE — Telephone Encounter (Signed)
Insurance information correct in patient demographics -pr

## 2019-02-16 NOTE — Progress Notes (Signed)
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 Last seen  on April 13, 2006 with the  recommendation that she maintain Symbicort at 160/4.5 two puffs b.i.d.  Take empiric Protonix at 40 mg b.i.d. before meals, which she failed to  do, and try Singulair 10 mg q.p.m. She said that Singulair did  nothing for her, and stopped it after a couple of weeks but is  convinced that Symbicort is helping, and that she is using less  albuterol than normal. It turns out, however, that she is still using  albuterol 4 or 5 times a day. She states she does not typically wake up  at night and need it.   Pulmonary function tests were reviewed from January 22, and indicate an  FEV1 of only 56% predicted with a ratio of 53% and a 15% improvement  after bronchodilators. rec gerd rx plus symbicort 160 2bid > all symptoms resolved once learned technique    08/24/2013 1st  office visit/ Alexis [name]   Off gerd rx/ on symbicort 160 2bid and ACEi now Chief Complaint  Patient presents with  . Advice Only    Old MW pt to reestablish care for Asthma, sleep study.   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 Why wakes 8 am not refreshed funny in the head and feels drowsy 10- noon. Only drives 10 min and on trips gets drowsy but husband always drives her.  rec Stop lisinopril Start micardis 80 mg one half daily  You need to breath clean air to reduce your risk of asthma flares  Read for 30 min before bed nightly - if you do wake up no light We will schedule you for a sleep study in one month     07/01/2016   Extended  ov/Alexis [name] re: reestablish re asthma / back on ACEi Chief  Complaint  Patient presents with  . Follow-up    asthma doingok, insurance will pay for symbicort but the copay is too high  for the past month has needed alb twice daily including one neb rx which is unusual for her with progressive doe "just about anything" and assoc dry day > noct cough  Difficulty sleeping due to nasal congestion/ cough sleeping mostly L side now rec Stop lisinopril and start losartan 50 mg daily in it's place, increase lasix as per your PC  Continue symbicort 160 Take 2 puffs first thing in am and then another 2 puffs about 12 hours later.  Work on inhaler technique:   Only use your albuterol as a rescue medication  Whenever you are coughing > Try prilosec otc 20mg   Take 30-60 min before first meal of the day and Pepcid ac (famotidine) 20 mg one @  bedtime until cough is completely gone for at least a week without the need for cough suppression GERD diet        08/01/2016  Extended f/u ov/Alexis [name] re:  dtc asthma on symb 160 2bid and avg saba once daily  Chief Complaint  Patient presents with  . Follow-up  4wk rov- pt states there was no improvment in breathing the first three weeks after last OV, pt had switched back to lisinopril due to losartan not controlling BP. pt reports occ non prod cough clear mucus & mild wheezing  thinks better not due to off acei but due to allergies  better since rained so changed back to acei instead of arb  Using saba q hs x early March 2018 which is a new issue for her and remains a problem, thinks may be due to noct HB rec Plan A = Automatic = Symbicort 80 Take 2 puffs first thing in am and then another 2 puffs about 12 hours later.  Plan B = Backup Only use your albuterol(ventolin)  Whenever cough/ wheeze for any reason or having heart burn >  Try prilosec otc 20mg   Take 30-60 min before first meal of the day and Pepcid ac (famotidine) 20 mg one @  bedtime until cough is completely gone for at least a week without the need for cough  suppression GERD .  I f breathing/ wheezing / coughing getting worse on this regimen,  You will either need to change back to an ARB (losartan is the cheapest but may not be the best) and off lisinopril or Let Dr Alexis [name] or your Primary doctor refer you to another specialist for example an Allergist / asthma specialist in [facility] as there is nothing more I can do for you in this circumstance. Please schedule a follow up office visit in 4 weeks, sooner if needed with medication formulary > did not do   email 09/28/16: "You were right about the Lisinopril and for the most part I don't have to much congestion. I still have some, but I still feel that symbicort and albuterol are handling it pretty good. Not perfect, but pretty good. "    11/07/2016 extended acute f/u ov/Alexis [name] re: re-establish re cough/sob Alexis [name] saba use   Chief Complaint  Patient presents with  . Acute Visit    Increased cough since June 2018- prod with clear to white sputum.  She is using her albuterol inhaler 3-4 x per day on average.   worse with cough/ wheeze x 2 months not on 80 not on ppi/h2hs  Not able to verify meds as did not bring them as requested, seeing multiple providers "each tells me something different"  rec Depomedrol 120 mg IM today  Prilosec 20 mg x 2 x 30 min before bfast and supper and take pepcid 20 mg at bedtime  For drainage / throat tickle try take CHLORPHENIRAMINE  4 mg - take one every 4 hours as needed - available over the counter- may cause drowsiness so start with just a bedtime dose or two and see how you tolerate it before trying in daytime Plan A = Automatic = Symbicort 80 Take 2 puffs first thing in am and then another 2 puffs about 12 hours later.  Plan B = Backup Only use your albuterol as a rescue medication For cough you can use robitussin and supplement with tessalon 100 up to 2 every 6 hours as needed        02/09/17 NP eval Med cal Pt assistance for symb   04/22/2017  f/u  ov/Alexis [name] re:  Asthma / uacs/ no med cal  Chief Complaint  Patient presents with  . Acute Visit    Pt states her breathing has been progressively worse since last ov Aug 2018.  She has also been coughing more  with clear to white.  She states she gets SOB with walking just a few steps.  She states she is having trouble sleeping due to cough.  She is using her albuterol inhaler at least 4 x per day.   sleeps propped up on 2 pillows L side / no 02 and uses avg alb one x per noct  Cough is more daytime min white mucus assoc with sob x a a few steps x 6 m gradually worse Has not been able to take ppi as instructed due to diarrhea with protonix rec GERD  Stop protonix and start aciphex 20 mg Take 30- 60 min before your first and last meals of the day  Change symbicort to 160 = dulera 200 Take 2 puffs first thing in am and then another 2 puffs about 12 hours later.  Only use your albuterol (Ventolin) as a rescue medication   If all else fails > Prednisone 10 mg take  4 each am x 2 days,   2 each am x 2 days,  1 each am x 2 days and stop    11/13/2017  f/u ov/Etheridge Geil re: asthma with component of uacs flared off gerd rx  Chief Complaint  Patient presents with  . Acute Visit    She c/o prod cough for the past 6 wks- clear to light yellow sputum.    Dyspnea:  Limited by knees > sob  Cough: worse x 2 weeks since stopped ppi / nexium  SABA use: hardly ever 02: no   Now living part time in [place] taking care of her mother  rec Resume nexium 20 mg Take 30-60 min before first meal of the day  GERD (REFLUX)  If breathing cough or congestion worse > Prednisone 10 mg take  4 each am x 2 days,   2 each am x 2 days,  1 each am x 2 days and stop     Virtual Visit via Telephone Note 02/16/2019  Re chronic asthma  I connected with Alexis [name] on 02/16/19 at  8:45 AM EST by telephone and verified that I am speaking with the correct person using two identifiers.   I discussed the limitations, risks, security  and privacy concerns of performing an evaluation and management service by telephone and the availability of in person appointments. I also discussed with the patient that there may be a patient responsible charge related to this service. The patient expressed understanding and agreed to proceed.   History of Present Illness: unable to take ppi daily due to diarrhea and can't afford aciphex so takes qod nexium and tol ok  Dyspnea:  Knees stop her first/ very limited  25 ft to car,online shopping Cough: assoc with pnds / better with symbicort / white mucus min amt  Sleeping: angled up with pillows to get to about 30 degree SABA use: rarely 02: none    No obvious day to day or daytime variability or assoc  purulent sputum or mucus plugs or hemoptysis or cp or chest tightness, subjective wheeze or overt sinus or hb symptoms.    Also denies any obvious fluctuation of symptoms with weather or environmental changes or other aggravating or alleviating factors except as outlined above.   Meds reviewed/ med reconciliation completed       Observations/Objective: Sounds great, no sob, good phonation   Assessment and Plan: See problem list for active a/p's   Follow Up Instructions: See avs for instructions unique to this ov which includes revised/  updated med list     I discussed the assessment and treatment plan with the patient. The patient was provided an opportunity to ask questions and all were answered. The patient agreed with the plan and demonstrated an understanding of the instructions.   The patient was advised to call back or seek an in-person evaluation if the symptoms worsen or if the condition fails to improve as anticipated.  I provided 25  minutes of non-face-to-face time during this encounter.   Christinia Gully, MD

## 2019-02-16 NOTE — Patient Instructions (Addendum)
Patient has MyChart but needs to change insurance to [employer]   Pt would like [place] & [place] info    If get worse > Prednisone 10 mg take  4 each am x 2 days,   2 each am x 2 days,  1 each am x 2 days and stop    For drainage / throat tickle try take CHLORPHENIRAMINE  4 mg  (Chlortab 4mg   at [employer] should be easiest to find in the green box)  take one every 4 hours as needed - available over the counter- may cause drowsiness so start with just a bedtime dose or two and see how you tolerate it before trying in daytime    Other option for nasal symptoms, coughing = trial of singulair as don't see any evidence in Epic that it has been tried.    Set up televisit in 3 months  - call sooner if needed

## 2019-02-21 ENCOUNTER — Encounter (INDEPENDENT_AMBULATORY_CARE_PROVIDER_SITE_OTHER): Payer: Self-pay | Admitting: Surgical

## 2019-03-01 ENCOUNTER — Ambulatory Visit (INDEPENDENT_AMBULATORY_CARE_PROVIDER_SITE_OTHER): Payer: Self-pay | Admitting: Podiatrist

## 2019-03-08 ENCOUNTER — Ambulatory Visit (INDEPENDENT_AMBULATORY_CARE_PROVIDER_SITE_OTHER): Payer: Self-pay | Admitting: Medical

## 2019-03-08 NOTE — Telephone Encounter (Addendum)
Phoned patient LM with call back number.  Hoy Finlay, RN  03/08/2019, 09:12      O'Kernick, Ashlee, PA-C  Karne Ozga, RN            Please call her and find out who was managing her Coumadin before. She needs an INR 3 days after starting back on Coumadin. She will need to see Dr. Beverley Fiedler in [place] since he doesn't come here anymore.   Ashlee O'Kernick, PA-C 03/07/2019, 16:15    Previous Messages    ----- Message -----   From: Vangie Bicker, RN   Sent: 03/07/2019  1:04 PM EST   To: Greer Pickerel, PA-C     Patient states that she is not eligible for the patient assistance for Eliquis. States that her heart rate is running 90-120 BMP. started back on her coumadin 6mg  per day. Please advise. She also asked for an appointment with Dr Beverley Fiedler, he had previously talked to her about an ablation       ----- Message -----   From: Georgina Snell   Sent: 03/07/2019  9:02 AM EST   To: [id number] Triage               ---- Message from Greer Pickerel, PA-C sent at 03/07/2019  4:15 PM EST -----  ----- Message from Georgina Snell sent at 03/07/2019  9:02 AM EST -----  Pt left message (Friday, Closed) that she felt she was in Afib. She wanted [place] to know that she stopped her Eliquis a week ago due to it being to expensive. Pt said she started back on her Warfrin a week ago and she is not sure if that is the cause of her going back into Afib.

## 2019-03-11 ENCOUNTER — Ambulatory Visit (INDEPENDENT_AMBULATORY_CARE_PROVIDER_SITE_OTHER): Payer: Self-pay | Admitting: Medical

## 2019-03-11 NOTE — Nursing Note (Signed)
Pt called into the office requesting an appt today. She stated that she is in Afib and she is c/o dizzy, palpitations, blurred vision. She stated that her pulse is 120 currently and she has a BP of 148/86.   I advised Mrs. Budreau I do not have a cardiologist here in the office, today who can see her and treat her. I advised her with the complaints that we recommend for the patient to report to the ED asap. She stated that she would go.   Youlanda Roys, LPN  [id number], [id number]

## 2019-03-15 ENCOUNTER — Ambulatory Visit (HOSPITAL_COMMUNITY): Payer: Self-pay | Admitting: Internal Medicine

## 2019-03-15 NOTE — Telephone Encounter (Addendum)
RE: Change Providers   Received: Today  Message Contents   Boggess, Charlie Pitter, RN            Done     Thank you    Previous Messages    ----- Message -----   From: Kathlee Nations, RN   Sent: 03/15/2019 11:43 AM EST   To: Caryl Asp Boggess   Subject: RE: Change Providers                 Not sure what is going on, but you are the third person asking this question. Can someone please take care of it.   [employer] from [place] sent at 03/11/2019 2:22 PM EST     Summary: RE: pt of Jeanice Lim - call back     Patient called in and stated that she didn't get a call back from yesterday's message. Patient would like a call back to let her know about switching. Patient states that she is in A-Fib right now and she said that needs to make a decision about what she is going to do. Patient would like a call back.     Summary: Change Providers     Patient is wanting to be switched over to Dr Beverley Fiedler,     She is asking for a call back at [phone]               Call History     Type Contact Phone User   03/11/2019 02:21 PM EST Phone (Incoming) Doloras, Berto (Self) [phone] (H) Sapic, Darolyn Rua   RE: pt of Jeanice Lim - call back   Received: 4 days ago   Message Contents   Kathlee Nations, RN Sapic, Darolyn Rua          I did respond to Lawerance Bach and it has not been opened. She asked the question/sent the message.   Marlowe Kays       Message from Genelle Gather sent at 03/10/2019 11:22 AM EST     Summary: RE: Change Providers           Patient is wanting to be switched over to Dr Beverley Fiedler,     She is asking for a call back at [phone]               Call History     Type Contact Phone User   03/10/2019 11:17 AM EST Phone (Incoming) Arine, Luks (Self) [phone] Lemmie Evens) Genelle Gather   RE: Change Providers   Received: 5 days ago   Message Contents   Kathlee Nations, RN Lawerance Bach [employer]. RPV with Dr. Beverley Fiedler. He will follow her Afib instead of [employer], but he does not come to [place].   I do not believe that he will follow her hypertension. That will need managed by PCP or with Dr. Deatra Canter as she is doing now.   Marlowe Kays     ----- Message -----   From: Fulton Mole   Sent: 03/15/2019 11:11 AM EST   To: [facility]   Subject: Change Providers                           Message from [place] sent at 03/15/2019 11:11 AM EST    Summary: RE: Change Providers  Pt is calling back to see if she can get in with Dr Beverley Fiedler.     Pt is currently in Afib.     Pt is requesting a call at [phone].     Thank you     ----- Message from Genelle Gather sent at 03/10/2019 11:22 AM EST -----         Patient is wanting to be switched over to Dr Beverley Fiedler,     She is asking for a call back at [phone]                     Call History     Type Contact Phone User   03/15/2019 11:09 AM EST Phone ([address]) Humayra, Huitron (Self) [phone] (H) Fulton Mole   03/10/2019 11:17 AM EST

## 2019-03-16 ENCOUNTER — Encounter (HOSPITAL_COMMUNITY): Payer: Self-pay | Admitting: Internal Medicine

## 2019-03-16 ENCOUNTER — Ambulatory Visit: Payer: Commercial Managed Care - PPO | Attending: Internal Medicine | Admitting: Internal Medicine

## 2019-03-16 ENCOUNTER — Other Ambulatory Visit: Payer: Self-pay

## 2019-03-16 VITALS — BP 113/73 | HR 80 | Temp 97.1°F | Ht 64.0 in | Wt 337.0 lb

## 2019-03-16 DIAGNOSIS — I4819 Other persistent atrial fibrillation: Secondary | ICD-10-CM | POA: Insufficient documentation

## 2019-03-16 NOTE — Progress Notes (Signed)
[place]  [place] [id number]  Operated by [facility]     Name: Alexandra Nunez MRN:  [id number]   Date: 03/16/2019 Age: 63 y.o.   DOB: 1955-11-16    PCP: Arley Phenix, MD    Reason For Visit: Here for follow up    History of Present Illness:     Alexandra Nunez is a 63 y.o. female who presents to the EP clinic today for follow up of persistent atrial fibrillation. The patient has a past medical history significant for hypertension, CKD stage 3, GERD, history of uterine cancer s/p radiation therapy, osteoarthritis, and obesity.     At today's visit, the patient reports she has been in atrial fibrillation for 2 weeks now. She reports having a history of multiple cardioversions in the past. Her Afib episodes occur mostly in the morning and the last couple of times, went into Afib during the holidays. She denies any consumption of alcohol recently. She currently experiences palpitations, significant fatigue, lightheadedness, dizziness, and worsening shortness of breath. She has no other complaints at this time.     Cardiac Medication:  Cardizem CD 240 mg  Eliquis 5 mg  Lasix 40 mg  Hydralazine 25 mg  Losartan 50 mg  Toprol-XL 25 mg    Allergies   Allergen Reactions   . Chocolate [Cocoa]    . Ciprofloxacin    . Prednisone      High doses  Other reaction(s): Hypertension (intolerance), Other (See Comments), Other (See Comments)  Increased BP  Raises her blood pressure     . Protonix [Pantoprazole]    . Sulfa (Sulfonamides)        Current Outpatient Medications   Medication Sig Dispense Refill   . acetaminophen (TYLENOL 8 HOUR) 650 mg Oral Tablet Sustained Release Take 1,300 mg by mouth Twice daily     . albuterol sulfate (PROVENTIL OR VENTOLIN OR PROAIR) 90 mcg/actuation Inhalation HFA Aerosol Inhaler Take 2 Puffs by inhalation Every 6 hours as needed     . budesonide-formoterol (SYMBICORT) 160-4.5 mcg/actuation Inhalation HFA Aerosol Inhaler Take 2 Puffs by  inhalation Twice daily     . dilTIAZem (CARDIZEM CD) 240 mg Oral Capsule, Sust. Release 24 hr Take 240 mg by mouth Once a day     . ELIQUIS 5 mg Oral Tablet TK 1 T PO BID     . esomeprazole magnesium (NEXIUM) 20 mg Oral Capsule, Delayed Release(E.C.) Take 20 mg by mouth Once a day      . fluticasone propionate (FLONASE) 50 mcg/actuation Nasal Spray, Suspension 2 Sprays by Each Nostril route Once per day as needed      . furosemide (LASIX) 40 mg Oral Tablet TAKE 1 TABLET BY MOUTH EVERY DAY (Patient taking differently: Take 60 mg by mouth Once a day Takes 1 1/2 tablet daily) 90 Tab 1   . hydrALAZINE (APRESOLINE) 25 mg Oral Tablet Take 1 Tab (25 mg total) by mouth Three times a day 90 Tab 5   . Ibuprofen (MOTRIN) 200 mg Oral Tablet Take 200 mg by mouth Three times a day     . losartan (COZAAR) 50 mg Oral Tablet Take 2 Tabs (100 mg total) by mouth Once a day 90 Tab 3   . melatonin 5 mg Oral Tablet, Chewable Take 5 mg by mouth Every night      . metoprolol succinate (TOPROL-XL) 25 mg Oral Tablet Sustained Release 24 hr Take 1 Tab (  25 mg total) by mouth Once a day 90 Tab 3     No current facility-administered medications for this visit.        Review of Systems:     Other than ROS in the HPI, all other systems were negative.    Physical Exam:  BP 113/73   Pulse 80   Temp 36.2 C (97.1 F)   Ht 1.626 m (5\' 4" )   Wt (!) 153 kg (337 lb)   SpO2 95%   BMI 57.85 kg/m       Constitutional: appears in good health, morbidly obese, appears stated age and no distress  Eyes: Pupils equal and round.   ENT: Mouth mucous membranes moist.   Neck: no thyromegaly or lymphadenopathy  Cardiovascular: irregularly irregular rhythm  Gastrointestinal: Soft, non-tender, non-distended  Genitourinary: Deferred  Musculoskeletal: Head atraumatic and normocephalic  Integumentary:  Skin warm and dry  Neurologic: Grossly normal, Alert and oriented x3  Lymphatic/Immunologic/Hematologic: No lymphadenopathy  Psychiatric: Normal    EKG:  03/16/19  Atrial fibrillation at 105 bpm, low voltage    ASSESSMENT AND PLAN:  1. Persistent atrial fibrillation    Alexandra Nunez is a 63 y.o. female who presents to the clinic for follow up evaluation of persistent atrial fibrillation. She is currently in atrial fibrillation. Discussed all options in detail including medical therapy, such as antiarrhythmic and rate control, and AV node ablation with pacemaker implantation. She is not a candidate for ablation and encouraged significant weight loss prior to the procedure. Patient is reluctant to start medical therapy and wishes to undergo AV node ablation and pacemaker implantation. Cardioversion could be considered while waiting for pacemaker/AV node ablation procedure.    I am scribing for, and in the presence of, Dr. Benancio Deeds, MD for services provided on 03/16/2019.  Lindalou Hose, SCRIBE     Lindalou Hose, SCRIBE  03/16/2019, 10:21    I personally performed the services described in this documentation, as scribed  in my presence, and it is both accurate  and complete.    Ricki Miller, MD

## 2019-03-17 LAB — ECG 12-LEAD
Atrial Rate: 340 {beats}/min
Calculated P Axis: 88 degrees
Calculated T Axis: -71 degrees
QRS Duration: 86 ms
QT Interval: 386 ms
QTC Calculation: 510 ms
Ventricular rate: 105 {beats}/min

## 2019-03-21 ENCOUNTER — Other Ambulatory Visit (INDEPENDENT_AMBULATORY_CARE_PROVIDER_SITE_OTHER): Payer: Self-pay | Admitting: Medical

## 2019-03-21 ENCOUNTER — Telehealth (INDEPENDENT_AMBULATORY_CARE_PROVIDER_SITE_OTHER): Payer: Self-pay | Admitting: INTERNAL MEDICINE CARDIOVASCULAR DISEASE

## 2019-03-21 DIAGNOSIS — I4819 Other persistent atrial fibrillation: Secondary | ICD-10-CM

## 2019-03-21 MED ORDER — METOPROLOL SUCCINATE ER 25 MG TABLET,EXTENDED RELEASE 24 HR
25.0000 mg | ORAL_TABLET | Freq: Every day | ORAL | 3 refills | Status: DC
Start: 2019-03-21 — End: 2020-04-16

## 2019-03-21 NOTE — Telephone Encounter (Addendum)
Pt called into office and left message that she is now back in SR and no longer in Afib. Cardioversion cancelled.   Hoy Finlay, RN  03/22/2019, 12:22    Phoned patient LM with call back number.  Hoy Finlay, RN  03/21/2019, 12:06    ----- Message from Leo Grosser, MD sent at 03/17/2019  1:32 PM EST -----  Sure no problem  Ill cc Shravya Wickwire and see if she is able to put her in next fri  Thanks  Deatra Canter  ----- Message -----  From: Ricki Miller, MD  Sent: 03/16/2019  11:11 AM EST  To: Leo Grosser, MD    I saw this lady, PM/AV node ablation is likely her best option, as she is a poor AF ablation candidate due to her weight and she does not want antiarrhythmics.  We can't schedule this yet as it requires an overnight stay, so we are probably looking at mid January.  She is wondering if you might be able to do a cardioversion to hold her over until that time.  Thanks.  Timmothy Sours

## 2019-03-22 ENCOUNTER — Ambulatory Visit (HOSPITAL_COMMUNITY): Payer: Self-pay | Admitting: INTERNAL MEDICINE CARDIOVASCULAR DISEASE

## 2019-03-22 NOTE — Telephone Encounter (Addendum)
Pt no longer needs cardioversion.  Hoy Finlay, RN  03/22/2019, 12:21      ----- Message from Georgina Snell sent at 03/22/2019 11:53 AM EST -----  Pt left message and said she wanted you to know she is back in sinus rhythm and no longer in Afib.

## 2019-03-22 NOTE — Addendum Note (Signed)
Addended by: Hoy Finlay BETH on: 03/22/2019 12:22 PM     Modules accepted: Orders

## 2019-03-23 ENCOUNTER — Other Ambulatory Visit (HOSPITAL_COMMUNITY): Payer: Self-pay | Admitting: Family

## 2019-03-23 DIAGNOSIS — I4819 Other persistent atrial fibrillation: Secondary | ICD-10-CM

## 2019-04-17 ENCOUNTER — Encounter (HOSPITAL_COMMUNITY): Payer: Self-pay | Admitting: Internal Medicine

## 2019-04-27 ENCOUNTER — Ambulatory Visit (HOSPITAL_BASED_OUTPATIENT_CLINIC_OR_DEPARTMENT_OTHER): Payer: Commercial Managed Care - PPO | Admitting: Obstetrics & Gynecology

## 2019-05-02 ENCOUNTER — Encounter (INDEPENDENT_AMBULATORY_CARE_PROVIDER_SITE_OTHER): Payer: Commercial Managed Care - PPO | Admitting: INTERNAL MEDICINE CARDIOVASCULAR DISEASE

## 2019-05-11 ENCOUNTER — Encounter (HOSPITAL_BASED_OUTPATIENT_CLINIC_OR_DEPARTMENT_OTHER): Payer: Commercial Managed Care - PPO | Admitting: Obstetrics & Gynecology

## 2019-05-20 ENCOUNTER — Encounter

## 2019-05-20 ENCOUNTER — Ambulatory Visit: Payer: Medicare PPO | Admitting: Internal Medicine

## 2019-05-20 ENCOUNTER — Other Ambulatory Visit: Payer: Self-pay

## 2019-05-24 ENCOUNTER — Encounter (HOSPITAL_COMMUNITY): Payer: Self-pay

## 2019-05-24 ENCOUNTER — Encounter (HOSPITAL_COMMUNITY): Payer: Self-pay | Admitting: Internal Medicine

## 2019-05-24 NOTE — Nursing Note (Signed)
EP case request for AVN ablation/PM implant ordered on 03/23/19. Patient last called on 04/29/19 to schedule procedure. Patient stated she "hadn't given it a second thought" but would call back if she wanted to schedule. Letter sent to patient with information on how to contact us if she would like to schedule procedure. EP case request will be deleted.

## 2019-06-22 ENCOUNTER — Other Ambulatory Visit (INDEPENDENT_AMBULATORY_CARE_PROVIDER_SITE_OTHER): Payer: Self-pay | Admitting: Physician Assistant

## 2019-06-22 MED ORDER — HYDRALAZINE 25 MG TABLET
25.00 mg | ORAL_TABLET | Freq: Three times a day (TID) | ORAL | 3 refills | Status: AC
Start: 2019-06-22 — End: ?

## 2019-08-01 ENCOUNTER — Other Ambulatory Visit (INDEPENDENT_AMBULATORY_CARE_PROVIDER_SITE_OTHER): Payer: Self-pay | Admitting: Medical

## 2019-08-01 MED ORDER — DILTIAZEM CD 240 MG CAPSULE,EXTENDED RELEASE 24 HR
240.0000 mg | ORAL_CAPSULE | Freq: Every day | ORAL | 1 refills | Status: DC
Start: 2019-08-01 — End: 2019-12-19

## 2019-08-03 ENCOUNTER — Ambulatory Visit (INDEPENDENT_AMBULATORY_CARE_PROVIDER_SITE_OTHER): Payer: Commercial Managed Care - PPO | Admitting: INTERNAL MEDICINE CARDIOVASCULAR DISEASE

## 2019-08-03 ENCOUNTER — Encounter (INDEPENDENT_AMBULATORY_CARE_PROVIDER_SITE_OTHER): Payer: Self-pay | Admitting: INTERNAL MEDICINE CARDIOVASCULAR DISEASE

## 2019-08-03 ENCOUNTER — Encounter (INDEPENDENT_AMBULATORY_CARE_PROVIDER_SITE_OTHER): Payer: Self-pay

## 2019-08-03 ENCOUNTER — Other Ambulatory Visit: Payer: Self-pay

## 2019-08-03 VITALS — BP 119/83 | HR 74 | Resp 18 | Ht 64.0 in | Wt 351.8 lb

## 2019-08-03 DIAGNOSIS — R42 Dizziness and giddiness: Secondary | ICD-10-CM

## 2019-08-03 DIAGNOSIS — Z9889 Other specified postprocedural states: Secondary | ICD-10-CM

## 2019-08-03 DIAGNOSIS — R0600 Dyspnea, unspecified: Secondary | ICD-10-CM

## 2019-08-03 DIAGNOSIS — I10 Essential (primary) hypertension: Secondary | ICD-10-CM

## 2019-08-03 DIAGNOSIS — G4733 Obstructive sleep apnea (adult) (pediatric): Secondary | ICD-10-CM

## 2019-08-03 DIAGNOSIS — I4819 Other persistent atrial fibrillation: Secondary | ICD-10-CM

## 2019-08-03 NOTE — Addendum Note (Signed)
Addended byLeo Grosser on: 08/03/2019 01:28 PM     Modules accepted: Orders

## 2019-08-03 NOTE — Progress Notes (Addendum)
[place]  [address]  [place] [address]  Phone: [phone]  Fax: [phone]    Encounter Date: 08/03/2019    Patient ID:  Alexandra Nunez  [id number]    DOB: Sep 05, 1955  Age: 64 y.o. female    Subjective:     Chief Complaint   Patient presents with    Follow Up 3 Months    Atrial Fibrillation    Dizziness     64 y.o. year old female here for follow up.  History of persistent atrial fibrillation and morbid obesity.  - First successful cardioversion 09/21/2017.  - TEE 10/01/17:  EF 60%, no significant valvular abnormalities  - Repeat cardioversion 07/01/2018 with successful restoration of sinus rhythm  Atrial fibrillation s/p cardioversion x3, most recent 11/30/18.        Dizziness  This is a recurrent problem. The current episode started more than 1 week ago. The problem occurs constantly. The problem has been gradually worsening. Associated symptoms include shortness of breath. Pertinent negatives include no chest pain, no abdominal pain and no headaches. The symptoms are aggravated by walking. Nothing relieves the symptoms. She has tried nothing for the symptoms.     Current Outpatient Medications   Medication Sig    acetaminophen (TYLENOL 8 HOUR) 650 mg Oral Tablet Sustained Release Take 1,300 mg by mouth Twice daily    albuterol sulfate (PROVENTIL OR VENTOLIN OR PROAIR) 90 mcg/actuation Inhalation HFA Aerosol Inhaler Take 2 Puffs by inhalation Every 6 hours as needed    budesonide-formoterol (SYMBICORT) 160-4.5 mcg/actuation Inhalation HFA Aerosol Inhaler Take 2 Puffs by inhalation Twice daily    dilTIAZem (CARDIZEM CD) 240 mg Oral Capsule, Sust. Release 24 hr Take 1 Capsule (240 mg total) by mouth Once a day    ELIQUIS 5 mg Oral Tablet TK 1 T PO BID    esomeprazole magnesium (NEXIUM) 20 mg Oral Capsule, Delayed Release(E.C.) Take 20 mg by mouth Once a day     fluticasone propionate (FLONASE) 50 mcg/actuation Nasal Spray,  Suspension 2 Sprays by Each Nostril route Once per day as needed     furosemide (LASIX) 40 mg Oral Tablet TAKE 1 TABLET BY MOUTH EVERY DAY (Patient taking differently: Take 60 mg by mouth Once a day Takes 1 1/2 tablet daily)    hydrALAZINE (APRESOLINE) 25 mg Oral Tablet Take 1 Tablet (25 mg total) by mouth Three times a day (Patient taking differently: Take 25 mg by mouth Three times a day temporarily not taking medication)    Ibuprofen (MOTRIN) 200 mg Oral Tablet Take 200 mg by mouth Three times a day    losartan (COZAAR) 50 mg Oral Tablet Take 2 Tabs (100 mg total) by mouth Once a day (Patient taking differently: Take 100 mg by mouth Once a day patient only taking half a dose as BP's have been running low)    melatonin 5 mg Oral Tablet, Chewable Take 5 mg by mouth Every night     metoprolol succinate (TOPROL-XL) 25 mg Oral Tablet Sustained Release 24 hr Take 1 Tab (25 mg total) by mouth Once a day     Allergies   Allergen Reactions    Chocolate [Cocoa]     Ciprofloxacin     Prednisone      High doses  Other reaction(s): Hypertension (intolerance), Other (See Comments), Other (See Comments)  Increased BP  Raises her blood pressure      Protonix [Pantoprazole]     Sulfa (  Sulfonamides)      Past Medical History:   Diagnosis Date    Asthma     Atrial fibrillation (CMS HCC)     Borderline diabetes     Chronic joint pain     CKD (chronic kidney disease) stage 3, GFR 30-59 ml/min     Essential hypertension     GERD (gastroesophageal reflux disease)     Knee pain, bilateral     Obesity     Osteoarthritis     Uterine cancer (CMS HCC)          Past Surgical History:   Procedure Laterality Date    CARDIOVERSION      COLONOSCOPY      HX APPENDECTOMY      HX DILATION AND CURETTAGE      HX HYSTERECTOMY      LAPAROSCOPIC TOTAL HYSTERECTOMY           Family Medical History:     Problem Relation (Age of Onset)    Coronary Artery Disease Mother, Father    HTN <20 y.o. Mother, Father            Social  History     Tobacco Use    Smoking status: Never Smoker    Smokeless tobacco: Never Used   Substance Use Topics    Alcohol use: Never    Drug use: Never       Review of Systems   Constitutional: Negative for fatigue.   Respiratory: Positive for shortness of breath. Negative for chest tightness.    Cardiovascular: Negative for chest pain, palpitations and leg swelling.   Gastrointestinal: Negative for abdominal pain and blood in stool.   Genitourinary: Negative for hematuria.   Neurological: Positive for dizziness. Negative for syncope and headaches.   Hematological: Does not bruise/bleed easily.   All other systems reviewed and are negative.    Objective:   Vitals: BP 119/83 (Site: Right, Patient Position: Sitting)    Pulse 74    Resp 18    Ht 1.626 m (5\' 4" )    Wt (!) 160 kg (351 lb 12.8 oz)    SpO2 96%    BMI 60.39 kg/m         Physical Exam  Vitals and nursing note reviewed.   Constitutional:       Appearance: She is well-developed.   HENT:      Head: Normocephalic.   Eyes:      Pupils: Pupils are equal, round, and reactive to light.   Neck:      Vascular: No JVD.   Cardiovascular:      Rate and Rhythm: Normal rate and regular rhythm.      Heart sounds: Normal heart sounds. No murmur heard.     Pulmonary:      Effort: Pulmonary effort is normal.      Breath sounds: Normal breath sounds.   Abdominal:      General: Bowel sounds are normal.      Palpations: Abdomen is soft.   Musculoskeletal:         General: No deformity. Normal range of motion.      Cervical back: Normal range of motion.   Skin:     General: Skin is warm and dry.   Neurological:      Mental Status: She is alert and oriented to person, place, and time.      Cranial Nerves: No cranial nerve deficit.  Assessment & Plan:     ENCOUNTER DIAGNOSES     ICD-10-CM   1. Persistent atrial fibrillation (CMS HCC)  I48.19   2. Essential hypertension  I10   3. Morbid obesity (CMS HCC)  E66.01   4. History of cardioversion  Z5.890     64 year  old lady with history of symptomatic atrial fibrillation which is persistent she gets dizzy when she gets up and walks.  She also gets winded with this.  She has undergone cardioversion 3 times in the past.  The most recent cardioversion his worked for her for about 3 months.  She also has tachy-brady syndrome and we had discussed a pacemaker within the past however she has not had any syncopal episodes and her rate control is fair at the moment.  On account of continued symptoms with AFib when she walks she requests a cardioversion and understands fully well that this may not work.  Additionally she has seen a surgeon for bariatric surgery and seeks preoperative risk stratification for this.  She has not had any stress testing in a while.  On account of exertion symptoms which could be anginal equivalent will proceed with a PET stress on account of her body habitus.  Will also update an echocardiogram.  Orders Placed This Encounter    PET CT: MYOCARDIAL PERFUSION W/PHARM INT - PART 1 - PET    ECG WITH INTERPRETATION/REPORT (AMB ONLY)    PET CT: MYOCARDIAL PERFUSION W/PHARM INT - PART 2 - ECG ADULT    TRANSTHORACIC ECHOCARDIOGRAM ([facility] ONLY)   This note may have been partially generated using MModal Fluency Direct system, and there may be some incorrect words, spellings, and punctuation that were not noted in checking the note before saving.      No follow-ups on file.    Leo Grosser, MD

## 2019-08-03 NOTE — Addendum Note (Signed)
Addended by: Kateri Plummer on: 08/03/2019 12:54 PM     Modules accepted: Orders

## 2019-08-03 NOTE — Nursing Note (Signed)
Pt did not have a med list with them at visit - went over list with patient   Advised patient to check AVS list with medicines at home and call if any discrepancies    -Confirmed correct pharmacy with patient for e-scribing   Excell Seltzer, MA  08/03/2019, 11:57

## 2019-08-05 ENCOUNTER — Encounter (HOSPITAL_BASED_OUTPATIENT_CLINIC_OR_DEPARTMENT_OTHER): Payer: Self-pay | Admitting: Obstetrics & Gynecology

## 2019-08-08 ENCOUNTER — Other Ambulatory Visit: Payer: Self-pay

## 2019-08-08 ENCOUNTER — Other Ambulatory Visit (HOSPITAL_COMMUNITY): Payer: Self-pay | Admitting: Surgery

## 2019-08-08 ENCOUNTER — Ambulatory Visit (INDEPENDENT_AMBULATORY_CARE_PROVIDER_SITE_OTHER): Payer: Self-pay | Admitting: INTERNAL MEDICINE CARDIOVASCULAR DISEASE

## 2019-08-08 ENCOUNTER — Encounter (HOSPITAL_COMMUNITY): Payer: Self-pay | Admitting: Surgery

## 2019-08-08 DIAGNOSIS — Z01818 Encounter for other preprocedural examination: Secondary | ICD-10-CM

## 2019-08-08 NOTE — Nursing Note (Signed)
Attempted to call the patient to inform of her planned cardioversion.     Patient is to have a [facility] pre admissions appt on 08/25/19 at 11 am and is to arrive at 10:30am.     Patient is sch for a cardioversion at [facility] on 09/01/2019 at am, patient is to be at [place] form [facility] will call the patient between 2-6pm for the offical arrival time.     Patient office f/u 09/21/2019 at 2:30pm with Dr. Deatra Canter.     Pt notified of all information and verbalized understanding. Packet of information mailed to the patient.   Youlanda Roys, LPN  [id number], [phone]

## 2019-08-08 NOTE — Nursing Note (Signed)
COVID-19 Admission Screen    Low Risk:   Able to Provide asymptomatic History  No recent Travel/ Following Stay at Home  No Sick Contacts  No New Household Conatcts    Moderate/High Risk: {None    Test Result:Pending 5/4

## 2019-08-10 ENCOUNTER — Other Ambulatory Visit: Payer: Self-pay

## 2019-08-10 ENCOUNTER — Ambulatory Visit: Payer: Commercial Managed Care - PPO | Attending: Surgery

## 2019-08-10 DIAGNOSIS — Z20822 Contact with and (suspected) exposure to covid-19: Secondary | ICD-10-CM | POA: Insufficient documentation

## 2019-08-10 DIAGNOSIS — Z01812 Encounter for preprocedural laboratory examination: Secondary | ICD-10-CM | POA: Insufficient documentation

## 2019-08-10 DIAGNOSIS — Z01818 Encounter for other preprocedural examination: Secondary | ICD-10-CM

## 2019-08-10 LAB — COVID-19 BDMAX - LAB USE ONLY: SARS-COV-2: NOT DETECTED

## 2019-08-11 ENCOUNTER — Other Ambulatory Visit: Payer: Self-pay

## 2019-08-11 ENCOUNTER — Encounter (HOSPITAL_COMMUNITY): Payer: Self-pay | Admitting: Family

## 2019-08-11 ENCOUNTER — Inpatient Hospital Stay
Admission: RE | Admit: 2019-08-11 | Discharge: 2019-08-11 | Disposition: A | Discharge status: Home / Self Care | Payer: Commercial Managed Care - PPO | Source: Ambulatory Visit | Attending: Surgery | Admitting: Surgery

## 2019-08-11 ENCOUNTER — Encounter (HOSPITAL_COMMUNITY): Payer: Self-pay | Admitting: Surgery

## 2019-08-11 ENCOUNTER — Ambulatory Visit (HOSPITAL_BASED_OUTPATIENT_CLINIC_OR_DEPARTMENT_OTHER): Payer: Commercial Managed Care - PPO | Admitting: Anesthesiology

## 2019-08-11 ENCOUNTER — Encounter (HOSPITAL_COMMUNITY)
Admission: RE | Disposition: A | Discharge status: Home / Self Care | Payer: Self-pay | Source: Ambulatory Visit | Attending: Surgery

## 2019-08-11 ENCOUNTER — Ambulatory Visit (HOSPITAL_COMMUNITY): Payer: Commercial Managed Care - PPO | Admitting: Anesthesiology

## 2019-08-11 DIAGNOSIS — Q402 Other specified congenital malformations of stomach: Secondary | ICD-10-CM | POA: Insufficient documentation

## 2019-08-11 DIAGNOSIS — K2950 Unspecified chronic gastritis without bleeding: Secondary | ICD-10-CM

## 2019-08-11 DIAGNOSIS — K317 Polyp of stomach and duodenum: Secondary | ICD-10-CM | POA: Insufficient documentation

## 2019-08-11 DIAGNOSIS — Z01818 Encounter for other preprocedural examination: Secondary | ICD-10-CM

## 2019-08-11 DIAGNOSIS — K449 Diaphragmatic hernia without obstruction or gangrene: Secondary | ICD-10-CM | POA: Insufficient documentation

## 2019-08-11 DIAGNOSIS — Z6841 Body Mass Index (BMI) 40.0 and over, adult: Secondary | ICD-10-CM | POA: Insufficient documentation

## 2019-08-11 HISTORY — DX: Sleep apnea, unspecified: G47.30

## 2019-08-11 LAB — CBC WITH DIFF
BASOPHIL #: 0 10*3/uL (ref 0.00–0.20)
BASOPHIL %: 0 %
EOSINOPHIL #: 0.1 10*3/uL (ref 0.00–0.50)
EOSINOPHIL %: 1 %
HCT: 35 % (ref 34.6–46.2)
HGB: 11.8 g/dL (ref 11.8–15.8)
LYMPHOCYTE #: 0.6 10*3/uL — ABNORMAL LOW (ref 0.90–3.40)
LYMPHOCYTE %: 8 %
MCH: 30.4 pg (ref 27.6–33.2)
MCHC: 33.7 g/dL (ref 32.6–35.4)
MCV: 90.3 fL (ref 82.3–96.7)
MONOCYTE #: 0.4 10*3/uL (ref 0.20–0.90)
MONOCYTE %: 5 %
MPV: 8 fL (ref 6.6–10.2)
NEUTROPHIL #: 7.4 10*3/uL — ABNORMAL HIGH (ref 1.50–6.40)
NEUTROPHIL %: 87 %
PLATELETS: 251 10*3/uL (ref 140–440)
RBC: 3.88 10*6/uL (ref 3.80–5.24)
RDW: 13.3 % (ref 12.4–15.2)
WBC: 8.6 10*3/uL (ref 3.5–10.3)

## 2019-08-11 LAB — BASIC METABOLIC PANEL
ANION GAP: 7 mmol/L
BUN/CREA RATIO: 24
BUN: 16 mg/dL (ref 10–25)
CALCIUM: 8.5 mg/dL — ABNORMAL LOW (ref 8.8–10.3)
CHLORIDE: 105 mmol/L (ref 98–111)
CO2 TOTAL: 28 mmol/L (ref 21–35)
CREATININE: 0.66 mg/dL (ref <=1.30)
ESTIMATED GFR: >60 mL/min/{1.73_m2}
GLUCOSE: 123 mg/dL — ABNORMAL HIGH (ref 70–110)
POTASSIUM: 4.1 mmol/L (ref 3.5–5.0)
SODIUM: 140 mmol/L (ref 135–145)

## 2019-08-11 LAB — PT/INR: INR: 1.21 — ABNORMAL HIGH (ref 0.80–1.10)

## 2019-08-11 SURGERY — GASTROSCOPY
Anesthesia: Monitor Anesthesia Care | Wound class: Clean Contaminated Wounds-The respiratory, GI, Genital, or urinary

## 2019-08-11 MED ORDER — PHENYLEPHRINE 100 MCG/ML IV DILUTION - FOR ANES
INJECTION | Freq: Once | INTRAVENOUS | Status: DC | PRN
Start: 2019-08-11 — End: 2019-08-11
  Administered 2019-08-11: 200 ug via INTRAVENOUS

## 2019-08-11 MED ORDER — PROPOFOL 10 MG/ML IV BOLUS
INJECTION | Freq: Once | INTRAVENOUS | Status: DC | PRN
Start: 2019-08-11 — End: 2019-08-11
  Administered 2019-08-11 (×2): 50 mg via INTRAVENOUS

## 2019-08-11 MED ORDER — LACTATED RINGERS INTRAVENOUS SOLUTION
INTRAVENOUS | Status: DC
Start: 2019-08-11 — End: 2019-08-11

## 2019-08-11 MED ORDER — SODIUM CHLORIDE 0.9 % (FLUSH) INJECTION SYRINGE
3.0000 mL | INJECTION | Freq: Three times a day (TID) | INTRAMUSCULAR | Status: DC
Start: 2019-08-11 — End: 2019-08-11

## 2019-08-11 MED ORDER — PROPOFOL 10 MG/ML IV - CHI
INTRAVENOUS | Status: DC | PRN
Start: 2019-08-11 — End: 2019-08-11
  Administered 2019-08-11: 75 ug/kg/min via INTRAVENOUS
  Administered 2019-08-11: 19:00:00 0 ug/kg/min via INTRAVENOUS

## 2019-08-11 MED ORDER — LIDOCAINE (PF) 100 MG/5 ML (2 %) INTRAVENOUS SYRINGE
INJECTION | Freq: Once | INTRAVENOUS | Status: DC | PRN
Start: 2019-08-11 — End: 2019-08-11
  Administered 2019-08-11: 100 mg via INTRAVENOUS

## 2019-08-11 MED ORDER — KETAMINE 50 MG/ML INJECTION SOLUTION
Freq: Once | INTRAMUSCULAR | Status: DC | PRN
Start: 2019-08-11 — End: 2019-08-11
  Administered 2019-08-11: 25 mg via INTRAVENOUS

## 2019-08-11 MED ORDER — SODIUM CHLORIDE 0.9 % (FLUSH) INJECTION SYRINGE
3.0000 mL | INJECTION | INTRAMUSCULAR | Status: DC | PRN
Start: 2019-08-11 — End: 2019-08-11
  Administered 2019-08-11: 3 mL

## 2019-08-11 SURGICAL SUPPLY — 42 items
BRUSH CLEANING ENDOSCOPE DISP_00711609 DBL HEADER 50/BX (INSTRUMENTS ENDOMECHANICAL) ×1
BRUSH CYTO 1.5MM 140CM BLT TIP ERG HNDL ATO STP SHEATH CLBR STRL DISP PTFE BRONCHSCP (CANNULA) IMPLANT
BRUSH CYTOLOGY BRONCHOSCOPY_ERG HNDL ATO STP SHEATH CLBR (CANNULA)
BRUSH DIA DOUBLE END INCL VALVE 90/230CM X 5MM LF NON STERILE (INSTRUMENTS ENDOMECHANICAL) ×1
BRUSH ENDOSCP CLN 90IN 5MM 2 END NONST LF (ENDOSCOPIC SUPPLIES) ×1 IMPLANT
CATH ELHMST INJ GLD PRB 7FR 25_GA .24MM 210CM BIPO RND DIST (BALLOON)
CATH ELHMST INJ GLD PROBE 7FR 25GA .24MM 210CM BIPOLAR RND DIST TIP STD CONN INTGR DISP 2.8MM MN WRK (BALLOON) IMPLANT
CLIP HMST MR CONDITIONAL BRD CATH ROT CONTROL KNOB NO SHEATH RSL 360 235CM 2.8MM 11MM OPN (SURGICAL INSTRUMENTS) IMPLANT
DEVICE HEMOSTASIS CLIP 360_235CM RESOLUTION (INSTRUMENTS)
ELECTRODE PATIENT RTN 9FT VLAB C30- LB RM PHSV ACRL FOAM CORD NONIRRITATE NONSENSITIZE ADH STRP (CAUTERY SUPPLIES) IMPLANT
ELECTRODE PATIENT RTN 9FT VLAB_REM C30- LB PLHSV ACRL FOAM (CAUTERY SUPPLIES)
FORCEPS BIOPSY HOT 240CM 2.2MM RJ 4 +2.8MM DISP (INSTRUMENTS)
FORCEPS BIOPSY HOT 240CM 2.2MM RJ 4 +2.8MM DISPO (SURGICAL INSTRUMENTS) IMPLANT
FORCEPS BIOPSY HOT 240CM 2.2MM_RJ 4 +2.8MM DISP (INSTRUMENTS)
FORCEPS BIOPSY LRG CPC NEEDLE 240CM 2.2MM RJ 3 DISP ORNG (ENDOSCOPIC SUPPLIES) ×1 IMPLANT
FORCEPS BIOPSY NEEDLE 160CM 1.8MM RJ 4 DISP YW 2MM WRK CHNL GSPED (SURGICAL INSTRUMENTS) IMPLANT
FORCEPS BIOPSY NEEDLE 160CM 1._8MM RJ 4 PED 2+ MM DISP (INSTRUMENTS)
FORCEPS BIOPSY NEEDLE 240CM RJ 4 JMB (SURGICAL INSTRUMENTS) IMPLANT
FORCEPS BIOPSY NEEDLE 240CM RJ_4 JMB DISP (INSTRUMENTS)
FORCEPS BIOPSY NEEDLE 240CM RJ_4 LRG CPC DISP (INSTRUMENTS ENDOMECHANICAL) ×1
GUARD PROTECT GARDENT TTH NYL ADULT LF  DISP (MISCELLANEOUS PT CARE ITEMS) ×1 IMPLANT
GUARD PROTECT GARDENT TTH NYL_ADLT LF DISP (MISCELLANEOUS PT CARE ITEMS) ×1
GW ENDOSCOPIC .035IN 260CM DREAMWIRE ANG RX EGLD NITINOL BIL STRL DISP (ENDOSCOPIC SUPPLIES) IMPLANT
JELLY LUB EZ BCTRST H2O SOL NG_RS FLPTP TUBE STRL 4OZ LF (MISCELLANEOUS PT CARE ITEMS) ×1
JELLY LUB EZ BCTRST WATER SOL NGRS FLIPTOP TUBE STRL 4OZ LF (MISCELLANEOUS PT CARE ITEMS) ×1 IMPLANT
KIT CAN MDVC ASCP SPECI CNVRT_NONST LF (TEST) ×1
LIGATOR 2.8MM 8.6-11.5MM SSS7 ESOPH 1 STNG MLT BAND HNDL STRL DISP ENDOS HMSTS LF (GI LAB SUPPLIES) IMPLANT
LIGATOR 2.8MM 8.6-11.5MM SSS7_ESOPH 1 STNG MLT BAND ERG HNDL (GI LAB SUPPLIES)
NEEDLE SCLRTX 25GA 2.3MM BVL STRL DISP STAR CATH INTJCT 4MM 240CM (NEEDLES & SYRINGE SUPPLIES) IMPLANT
NEEDLE SCLRTX 25GA 2.3MM BVL S_TRL DISP STAR CATH INTJCT 4MM (NEEDLES & SYRINGE SUPPLIES)
NET SPEC RETR 230CM 2.5MM RTHNT STD SHEATH 6X3CM NONST LF  DISP (Dilators) IMPLANT
NET SPEC RETR 230CM 2.5MM RTHN_T STD SHTH 6X3CM NONST LF (Dilators)
PROBE ESURG 220CM 2.3MM FIAPC FLXB STR FIRE STRL DISP (CAUTERY SUPPLIES) IMPLANT
PROBE ESURG 220CM 2.3MM FIAPC_FLXB STR FIRE ARGON PLAS COAG (CAUTERY SUPPLIES)
SNARE LRG OVL MED STF ENDOS OL_MPS DISP (INSTRUMENTS ENDOMECHANICAL)
SNARE MED OVAL 240CM 2.4MM CAPTIVATR STF ENDOS PLYP 27MM DISP (UROLOGICAL SUPPLIES) IMPLANT
SNARE MED OVL 240CM 2.4MM CAPT_IVATR STF ENDOS PLYP 27MM DISP (UROLOGICAL SUPPLIES)
SOCK CAN MEDIVAC ASCP SPECI CNVRT NONST LF (TEST) ×1 IMPLANT
TRAP LL CONN REM SCRN INSTREAM SPECI POLYPROP DISP (INSTRUMENTS)
TRAP LL CONN REM SCRN INSTREAM SPECI POLYPROP DISP (SURGICAL INSTRUMENTS) IMPLANT
TRAY GASTRIC LAV 36IN 34FR ARGYLE EDLICH MONOJECT LRG PVC 4 EYE CLS END GRAD SYRG TRNSPR 140CC NONST (TRAY) IMPLANT
TRAY GASTRIC LAV 36IN 34FR ARG_YLE EDLICH MONOJECT LRG PVC 4 (TRAY)

## 2019-08-11 NOTE — H&P (Signed)
Patient seen examined.  No significant changes from my last office H and P. Please find attached below.    Subjective  Patient was seen at the [facility] office.         Dear Dr. Carlene Coria    I had the pleasure of seeing your patient in my office. Please see below my note for today's evaluation.          History of present illness:  The patient is a very pleasant 64 year old female with past medical history of super morbid obesity ( BMI 59.56) atrial fibrillation, hypertension, osteoarthritis, hyperlipidemia, asthma, GERD presents today to discuss bariatric surgical options.  Patient reports he has been struggling with her weight since the age of 68. her lowest adult weight was 205 lb in 1993.  Highest adult weight was 360 lb in 2021. patient reports she has tried multiple weight loss options, including weight watchers, Slim-Fast, and medically supervised dietary plans.  None of these have resulted in durable in significant weight loss.  She is therefore interested in bariatric surgery.          Patient was questioned length about GERD heartburn symptoms.  She reports she has been struggling heartburn for a large number of years.  She will have daily symptoms, intermittent regurgitation, almost daily water brash symptoms.  Denies dysphagia or chest pain.  She normally only takes over-the-counter Maalox for Tums.  She was recently started on omeprazole reports that when she has a supply of that, she does note significant relief her symptoms.        Review of systems:  General: Denies fever, chills  HEENT: Denies blurry vision, hearing loss.  Cardiovascular: Positive for palpitations and heart racing. Denies chest pain.  Respiratory:   Positive for asthma and snoring. Denies SOB, cough.  Gastrointestinal: denies abdominal pain, nausea, vomiting, hematemesis, diarrhea, constipation, hematochezia, melena   GU: Denies dysuria, change in urinary habits, or hematuria.  Musculoskeletal:   Positive for joint pain in  bilateral knees and limited mobility .Denies weakness.  Neurological: Denies HA, dizziness.  Hematologic: Denies hx of bleeding disorders or blood clots.  Psychiatric: Denies changes in mood, anxiety, or depression.           Past medical history: Atrial fibrillation, hypertension, osteoarthritis, prediabetes, hyperlipidemia, asthma, GERD.      Past surgical history: Multiple D&Cs, hysterectomy for endometrial cancer in 2016, open appendectomy.    Medications:  Scanned in the system.      Allergies:  Sulfa drugs -vomiting.  Food allergies.      Social history:  Denies tobacco use, denies alcohol use, denies history of illicit drug abuse.      Family history:  Diabetes- father, paternal aunt x2.  Hyperlipidemia- father.  Hypertension- father, mother.  Coronary artery disease - father, mother.  Patient denies family history of cancers that she is aware of.  Denies any family history of bleeding or clotting disorders that she is aware.      Objective  Constitutional:  Alert oriented x 4, not in acute distress.  Neuro: AAOx3, CN II-XII grossly intact, no focal neuro deficits appreciated  HEENT: Normocephalic, atraumatic. PERRLA, Moist mucus membranes Neck: Trachea midline, supple. No lymphadenopathy  Pulmonary: Normal effort and rate observed. No respiratory distress. Comfortable on RA   Cardiovascular:  HDS, palpable peripheral pulses, regular rate per palpation  Breast: Deferred  Abdomen: soft,   Mild tenderness right lower quadrant, non distended, No masses present, no hepatosplenomegaly, no palpable hernias. No rebound/guarding/peritoneal signs.  Extremities: No obvious deformities. No peripheral edema; No cyanosis or clubbing of nails.  Musculoskeletal: Normal muscle strength and tone of all four extremities.  Skin: No obvious rashes or lesions present; Warm and dry. No jaundice.  Psychiatric: Normal mood and affect; Judgement and thought content normal..       Assessment  64 year old female with past medical  history of super morbid obesity ( BMI 59.56) atrial fibrillation, hypertension, osteoarthritis, hyperlipidemia, asthma, GERD presents today to discuss bariatric surgical options.         Symptoms/signs of sleep apnea - STOP-BANG sleep apnea questionnaire score 6, risk of sleep apnea  high.       Plan Print visit summary  - Pt meets NIH criteria for bariatric surgery. We have discussed risks, benefits, and alternatives for weight loss. We also spent a considerable amount of time discussing various bariatric surgical options. At this time, pt is interested in  sleeve gastrectomy. This procedure would have a significant impact on weight loss, as well as improvement and possible resolution of obesity related co-morbidities. The operative risk is  elevated but acceptable, and pt remains a good surgical candidate for this procedure.   - Pt will require the following:     - Psychiatric evaluation     - 4 Monthly dietary appointments     - Cardiac clearance - patient reports her cardiologist has recommended a pacer.  Will defer need to her cardiologist.  We will also need a definitive plan for her  perioperative anticoagulation as patient is on Eliquis twice a day.     - Pulm clearance     - Sleep Study     - Labs including CBC, CMP, TSH, lipid profile.     - RUQ [place] to evaluate for cholelithiasis     - Pre-operative EGD     - Attend our online bariatric seminar    - Pending completion of all of the above, pt will be seen in clinic to obtain final clearance for surgery.         I spent 45 minutes with the patient in education and discussing the results and answered all questions. More than 50% of the time was spent in counseling and education.   .         It was a pleasure participating in the care of this pleasant patient, please feel free to contact me for any questions or concerns.         CC: Dr. Alyson Reedy MD.         Thea Gist MD   Weight Loss/General Surgery   [place]    Phone:  [phone]  Fax: [phone]  website: [email or web address] Office:  [address] [place], [place] [address]       [employer] Office:  [address],   [place], [place]  [place], [place] [address].         This note may have been partially generated using MModal Fluency Direct system, and there may be some incorrect words, spellings, and punctuation that were not noted in checking the note before saving.

## 2019-08-11 NOTE — Anesthesia Preprocedure Evaluation (Addendum)
ANESTHESIA PRE-OP EVALUATION  Planned Procedure: GASTROSCOPY (N/A )  Review of Systems                   Pulmonary   asthma and sleep apnea,   Cardiovascular    Hypertension, ECG reviewed, 2019 TTE  Conclusions:  1. Normal left ventricular size. Normal left ventricular ejection fraction. LV Ejection Fraction is 67 %.  Normal geometry. Left ventricular diastolic parameters were normal.  2. Normal right ventricular size. RV Systolic Pressure is normal.  3. There is mild primary mitral valve regurgitation.  4. There is mild tricuspid regurgitation.    A Fib with RVR and atrial fibrillation ,No peripheral edema,        GI/Hepatic/Renal    GERD     Endo/Other    obesity and morbid obesity,       Neuro/Psych/MS        Cancer                   Physical Assessment      Airway       Mallampati: III    TM distance: >3 FB    Neck ROM: full  Mouth Opening: fair.  No Facial hair  No Beard        Dental       Dentition intact             Pulmonary    Breath sounds clear to auscultation  (-) no rhonchi, no decreased breath sounds, no wheezes, no rales and no stridor     Cardiovascular    Rhythm: regular  Rate: Normal  (-) no friction rub, carotid bruit is not present, no peripheral edema and no murmur     Other findings            Plan  ASA 3     Planned anesthesia type: MAC              Intravenous induction     Anesthesia issues/risks discussed are: Dental Injuries, PONV, Post-op Pain Management, Post-op Cognitive Dysfunction, Aspiration, Intraoperative Awareness/ Recall, Stroke, Cardiac Events/MI, Post-op Agitation/Tantrum, Blood Loss, Difficult Airway and Sore Throat.  Anesthetic plan and risks discussed with patient.          Patient's NPO status is appropriate for Anesthesia.           Plan discussed with CRNA.

## 2019-08-11 NOTE — Anesthesia Postprocedure Evaluation (Signed)
Anesthesia Post Op Evaluation    Patient: Alexandra Nunez  Procedure(s):  GASTROSCOPY    Last Vitals:Temperature: 36 C (96.8 F) (08/11/19 1359)  Heart Rate: 52 (08/11/19 1359)  BP (Non-Invasive): 131/83 (08/11/19 1359)  Respiratory Rate: 18 (08/11/19 1359)  SpO2: 98 % (08/11/19 [id number])    No complications documented.    Patient is sufficiently recovered from the effects of anesthesia to participate in the evaluation and has returned to their pre-procedure level.  Patient location during evaluation: PACU       Patient participation: complete - patient participated  Level of consciousness: awake and alert and responsive to verbal stimuli    Pain management: adequate  Airway patency: patent    Anesthetic complications: no  Cardiovascular status: acceptable  Respiratory status: acceptable  Hydration status: acceptable  Patient post-procedure temperature: Pt Normothermic   PONV Status: Absent

## 2019-08-11 NOTE — OR PreOp (Signed)
COVID-19 Admission Screen    Low Risk:   Able to Provide asymptomatic History  No recent Travel/ Following Stay at Home  No Sick Contacts    Moderate/High Risk: {None    Test Result:Negative: Date: 08/10/2019

## 2019-08-11 NOTE — Anesthesia Transfer of Care (Signed)
ANESTHESIA TRANSFER OF CARE   Alexandra Nunez is a 64 y.o. ,female, Weight: (!) 165 kg (364 lb 3.2 oz)   had Procedure(s):  GASTROSCOPY  performed  08/11/19   Primary Service: Kristin Bruins, MD    Past Medical History:   Diagnosis Date    Asthma     Atrial fibrillation (CMS HCC)     Borderline diabetes     Chronic joint pain     CKD (chronic kidney disease) stage 3, GFR 30-59 ml/min     Essential hypertension     GERD (gastroesophageal reflux disease)     Knee pain, bilateral     Obesity     Osteoarthritis     Sleep apnea     Uterine cancer (CMS HCC)       Allergy History as of 08/11/19     SULFA (SULFONAMIDES)       Noted Status Severity Type Reaction    07/06/17 1336 Hunt, Healy Ropes, LPN [id number] Active             PREDNISONE       Noted Status Severity Type Reaction    10/25/18 1405 Glaspell, [place], [place] 12/19/14 Active       Comments: High doses  Other reaction(s): Hypertension (intolerance), Other (See Comments), Other (See Comments)  Increased BP  Raises her blood pressure       10/25/18 1405 Glaspell, [place], MA 07/06/17 Active       Comments: High doses     02/16/18 0823 Hunt, [place] Ropes, LPN [id number] Deleted       Comments: High doses     07/06/17 1337 Hunt, Daisy, LPN [id number] Active       Comments: High doses           FLAVORING AGENT       Noted Status Severity Type Reaction    08/11/19 1348 Reep, [place], RN 07/06/17 Active       Comments: Makes mouth numb     08/11/19 1348 ReepClyde Canterbury, RN 07/06/17 Active       10/29/17 1558 Rhodes, [place], [place] 07/06/17 Deleted       07/06/17 1337 Eleonore Chiquito, LPN [id number] Active             ACE INHIBITORS       Noted Status Severity Type Reaction    07/23/17 2225 Serena Colonel, RN 07/06/17 Deleted       07/06/17 1359 Gavin Potters, APRN 07/06/17 Active             PANTOPRAZOLE       Noted Status Severity Type Reaction    07/06/17 1400 Gavin Potters, APRN 07/06/17 Active             COCOA       Noted Status Severity Type Reaction    09/28/18 [place], [place], Ambulatory  Care Assistant 09/28/18 Active             CIPROFLOXACIN       Noted Status Severity Type Reaction    10/25/18 1405 Glaspell, [place], MA 04/12/18 Active                 I completed my transfer of care / handoff to the receiving personnel during which we discussed:  Access, Airway, All key/critical aspects of case discussed, Analgesia, Antibiotics, Expectation of post procedure, Fluids/Product, Gave opportunity for questions and acknowledgement of understanding, Labs and PMHx    Post Location: PACU  Last OR Temp: Temperature: 36 C (96.8 F)  ABG:  POTASSIUM   Date Value Ref Range Status   08/11/2019 4.1 3.5 - 5.0 mmol/L Final     CALCIUM   Date Value Ref Range Status   08/11/2019 8.5 (L) 8.8 - 10.3 mg/dL Final     Calculated P Axis   Date Value Ref Range Status   03/16/2019 88 degrees Final     Calculated T Axis   Date Value Ref Range Status   03/16/2019 -71 degrees Final     Airway:* No LDAs found *  Blood pressure 131/83, pulse 52, temperature 36 C (96.8 F), resp. rate 18, height 1.626 m (5\' 4" ), weight (!) 165 kg (364 lb 3.2 oz), SpO2 98 %, not currently breastfeeding.

## 2019-08-11 NOTE — Discharge Instructions (Signed)
Nurse Navigators (Nurse on Call)  Our nurses are available to assist you 24/7 at [phone]. These registered nurses provide immediate expert advice as a free service to you.  Consider calling the nurse if you feel ill, have questions about your condition, or are unsure if you should seek medical help.EGD discharge Provation instruction sheets given and reviewed.

## 2019-08-15 LAB — SURGICAL PATHOLOGY SPECIMEN

## 2019-08-24 ENCOUNTER — Ambulatory Visit (HOSPITAL_BASED_OUTPATIENT_CLINIC_OR_DEPARTMENT_OTHER): Payer: Commercial Managed Care - PPO | Admitting: Obstetrics & Gynecology

## 2019-08-25 ENCOUNTER — Other Ambulatory Visit: Payer: Self-pay

## 2019-08-25 ENCOUNTER — Emergency Department (HOSPITAL_COMMUNITY): Payer: Commercial Managed Care - PPO

## 2019-08-25 ENCOUNTER — Encounter (HOSPITAL_COMMUNITY): Payer: Self-pay

## 2019-08-25 ENCOUNTER — Emergency Department
Admission: EM | Admit: 2019-08-25 | Discharge: 2019-08-25 | Disposition: A | Discharge status: Home / Self Care | Payer: Commercial Managed Care - PPO | Attending: Physician Assistant | Admitting: Physician Assistant

## 2019-08-25 DIAGNOSIS — R109 Unspecified abdominal pain: Secondary | ICD-10-CM

## 2019-08-25 DIAGNOSIS — S76011A Strain of muscle, fascia and tendon of right hip, initial encounter: Secondary | ICD-10-CM | POA: Insufficient documentation

## 2019-08-25 MED ORDER — METHOCARBAMOL 500 MG TABLET
500.00 mg | ORAL_TABLET | Freq: Four times a day (QID) | ORAL | 0 refills | Status: AC
Start: 2019-08-25 — End: 2019-09-04

## 2019-08-25 MED ORDER — METHOCARBAMOL 500 MG TABLET
1000.00 mg | ORAL_TABLET | ORAL | Status: AC
Start: 2019-08-25 — End: 2019-08-25
  Administered 2019-08-25: 1000 mg via ORAL
  Filled 2019-08-25: qty 2

## 2019-08-25 NOTE — ED Provider Notes (Signed)
Department of Emergency [place]  HPI - 08/25/2019      Advanced Practice Provider: Leodis Liverpool, PA-C  Attending Physician: Dr. Gentry Fitz     Chief Complaint: Groin Pain     HPI  Alexandra Nunez is a 64 y.o. female who presents to the ED via POV with c/o right groin pain that has been intermittent for 6-7 months, but constant for the last 2 weeks. She states the pain is now extending down the RLE. Pain is exacerbated with movement and lifting her right leg, but alleviated with resting it. No bulging in the area. Pt states she has followed with ortho for bilateral knee pain and is supposed to have bilateral knee replacement, but is not approved to until she loses weight. Pt also notes that her left knee is has worse pain, therefore she is concerned that her right groin pain is due to overcompensating. Pt reports that she recently followed with [facility] and had an MRI of the low back and right hip. She also reports associated RLE numbness. She denies all other complaints at this time. Of note, the pt also reports that she is scheduled for gastric sleeve at the end of July.      History Limitations: None      Review of Systems  Constitutional: No fever, chills or weakness   Skin: No rash or diaphoresis  HENT: No headaches or congestion  Eyes: No vision changes   Cardio: No chest pain, palpitations or leg swelling   Respiratory: No cough, wheezing or SOB  GI:  No abdominal pain, nausea, vomiting or stool changes  GU:  No urinary changes   MSK: No back pain +right groin pain that radiates down the RLE   Neuro: No seizures or LOC +numbness to the RLE   All other systems reviewed and are negative.    History:   PMH:    Past Medical History:   Diagnosis Date    Asthma     Atrial fibrillation (CMS HCC)     Borderline diabetes     Chronic joint pain     CKD (chronic kidney disease) stage 3, GFR 30-59 ml/min     Essential hypertension     GERD (gastroesophageal reflux disease)     Knee  pain, bilateral     Obesity     Osteoarthritis     Sleep apnea     Uterine cancer (CMS HCC)          PSH:    Past Surgical History:   Procedure Laterality Date    CARDIOVERSION      COLONOSCOPY      HX APPENDECTOMY      HX DILATION AND CURETTAGE      HX HYSTERECTOMY      LAPAROSCOPIC TOTAL HYSTERECTOMY      SINUS SURGERY  2004         Social Hx:    Social History     Socioeconomic History    Marital status: Married     Spouse name: Not on file    Number of children: Not on file    Years of education: Not on file    Highest education level: Not on file   Occupational History    Not on file   Tobacco Use    Smoking status: Never Smoker    Smokeless tobacco: Never Used   Substance and Sexual Activity    Alcohol use: Never    Drug use:  Never    Sexual activity: Not on file   Other Topics Concern    Ability to Walk 1 Flight of Steps without SOB/CP Not Asked    Routine Exercise Not Asked    Ability to Walk 2 Flight of Steps without SOB/CP Not Asked    Unable to Ambulate Not Asked    Total Care Not Asked    Ability To Do Own ADL's Not Asked    Uses Walker Not Asked    Other Activity Level Not Asked    Uses Cane Not Asked   Social History Narrative    Not on file     Social Determinants of Health     Financial Resource Strain:     Difficulty of Paying Living Expenses:    Food Insecurity:     Worried About Charity fundraiser in the Last Year:     Arboriculturist in the Last Year:    Transportation Needs:     Film/video editor (Medical):     Lack of Transportation (Non-Medical):    Physical Activity:     Days of Exercise per Week:     Minutes of Exercise per Session:    Stress:     Feeling of Stress :    Intimate Partner Violence:     Fear of Current or Ex-Partner:     Emotionally Abused:     Physically Abused:     Sexually Abused:      Family Hx:   Family History   Problem Relation Age of Onset    Coronary Artery Disease Mother     HTN <20 y.o. Mother     Coronary Artery Disease Father     HTN <20 y.o. Father      Breast Cancer Neg Hx     Colon Cancer Neg Hx     Cancer Neg Hx      Allergies:   Allergies   Allergen Reactions    Chocolate [Cocoa]     Ciprofloxacin     Peanut Butter Flavor [Flavoring Agent]      Makes mouth numb    Prednisone      High doses  Other reaction(s): Hypertension (intolerance), Other (See Comments), Other (See Comments)  Increased BP  Raises her blood pressure      Protonix [Pantoprazole]     Sulfa (Sulfonamides)      Medications:   Prior to Admission Medications   Prescriptions Last Dose Informant Patient Reported? Taking?   ELIQUIS 5 mg Oral Tablet 08/25/2019 at Unknown time  Yes Yes   Sig: TK 1 T PO BID   Ibuprofen (MOTRIN) 600 mg Oral Tablet 08/25/2019 at Unknown time  Yes Yes   Sig: Take 600 mg by mouth Once per day as needed for Pain   acetaminophen (TYLENOL 8 HOUR) 650 mg Oral Tablet Sustained Release 08/25/2019 at Unknown time Patient Yes Yes   Sig: Take 1,300 mg by mouth Twice daily   albuterol sulfate (PROVENTIL OR VENTOLIN OR PROAIR) 90 mcg/actuation Inhalation HFA Aerosol Inhaler 08/25/2019 at Unknown time  Yes Yes   Sig: Take 2 Puffs by inhalation Every 6 hours as needed   budesonide-formoterol (SYMBICORT) 160-4.5 mcg/actuation Inhalation HFA Aerosol Inhaler 08/25/2019 at Unknown time  Yes Yes   Sig: Take 2 Puffs by inhalation Twice daily   dilTIAZem (CARDIZEM CD) 240 mg Oral Capsule, Sust. Release 24 hr 08/25/2019 at Unknown time  No Yes   Sig: Take 1 Capsule (240 mg total)  by mouth Once a day   esomeprazole magnesium (NEXIUM) 20 mg Oral Capsule, Delayed Release(E.C.)   Yes No   Sig: Take 20 mg by mouth Once a day    Patient not taking: Reported on 08/11/2019   fluticasone propionate (FLONASE) 50 mcg/actuation Nasal Spray, Suspension 08/25/2019 at Unknown time  Yes Yes   Sig: 2 Sprays by Each Nostril route Once per day as needed    furosemide (LASIX) 40 mg Oral Tablet 08/25/2019 at Unknown time  No Yes   Sig: TAKE 1 TABLET BY MOUTH EVERY DAY   Patient taking differently: Take 60 mg by mouth Once a  day Takes 1 1/2 tablet daily   hydrALAZINE (APRESOLINE) 25 mg Oral Tablet   No No   Sig: Take 1 Tablet (25 mg total) by mouth Three times a day   Patient not taking: Reported on 08/08/2019   losartan (COZAAR) 50 mg Oral Tablet 08/25/2019 at Unknown time  No Yes   Sig: Take 2 Tabs (100 mg total) by mouth Once a day   Patient taking differently: Take 100 mg by mouth Once a day patient only taking half a dose as BP's have been running low   melatonin 5 mg Oral Tablet, Chewable 08/25/2019 at Unknown time  Yes Yes   Sig: Take 5 mg by mouth Every night    metoprolol succinate (TOPROL-XL) 25 mg Oral Tablet Sustained Release 24 hr 08/25/2019 at Unknown time  No Yes   Sig: Take 1 Tab (25 mg total) by mouth Once a day      Facility-Administered Medications: None       Above history reviewed with patient, changes are as documented.    Physical Exam   Nursing notes reviewed.    Filed Vitals:    08/25/19 1725   BP: 139/61   Pulse: 66   Resp: 16   Temp: 36.4 C (97.5 F)   SpO2: 95%        Constitutional: NAD. Oriented  HENT:   Head: Normocephalic and atraumatic.   Mouth/Throat: Oropharynx is clear and moist.   Eyes: EOMI, PERRL   Neck: Trachea midline. Neck supple.  Cardiovascular: Regular rate and rhythm. No murmurs, rubs or gallops. Intact distal pulses.  Pulmonary/Chest: BS equal bilaterally. No respiratory distress. No wheezes, rales or chest tenderness.   GI: BS +. Abdomen soft, no tenderness, rebound or guarding.         Musculoskeletal: Decreased ROM of the right hip secondary to pain. Mild TTP noted to the ASIS to the pubic synthesis on the right. Pain with flexion of the right hip. No edema or deformity.  Skin: Warm and dry. No rash, erythema, pallor or cyanosis  Psychiatric: Normal mood and affect. Behavior is normal.   Neurological: Alert. Grossly intact.    Course  Orders, Abnormal Labs and Imaging Results:  Results up to the Time the Disposition was Entered   XR LUMBOSACRAL SPINE    Narrative:     [name]    PROCEDURE DESCRIPTION: XR LUMBOSACRAL SPINE    CLINICAL INDICATION: paresthesias in right leg/chronic back pain    5 lumbar spine views without comparison.    FINDINGS:  No lumbar fracture is seen. There is significant disc space narrowing at  L1-2, L2-3, L3-4 with slight retrolisthesis at L2-3, L3-4. There is slight  anterolisthesis at L4-5. There are facet degenerative changes at L4-5 and  L5-S1.     XR HIP RIGHT W PELVIS 2-3 VIEWS  Narrative:     [name]    PROCEDURE DESCRIPTION: XR HIP RIGHT W PELVIS 2-3 VIEWS    CLINICAL INDICATION: right hip pain    2 views of the pelvis and right hip.    No fracture is seen. There is no hip dislocation or femoral head contour  abnormality. There is no widening of SI joints or pubic symphysis.  There  is joint space narrowing of both hips with sclerosis of the acetabula from  degenerative change     methocarbamol (ROBAXIN) tablet (1,000 mg Oral Given 08/25/19 2017)       EKG: None indicated.   MDM:   Therapy/Procedures/Course/MDM:   Patient was vitally stable throughout visit.    Imaging: Reviewed as above.   Lab results: None indicated.   Patient received: Robaxin.   Results discussed with patient.    See ED course.  Advised the patient return to the ED if patient begins to develop any worsening symptoms.  Pt voiced understanding and was agreeable to the plan  she was given the opportunity to ask questions.      ED Course as of Sep 07 742   Thu Aug 25, 2019   1959 Patient does have significant pain in the groin.  I believe that the right groin pain is related to a muscle injury or muscle strain.  I recommend conservative treatment at this time including the use of heat and rest.  I will be prescribing a prescription for Robaxin to help with the muscle pain.  The patient also does have significant spinal stenosis noted on x-ray imaging.  Because of this I will be placing referral to Dr. Bernette Redbird and recommended patient follow-up with them promptly.  If any new  or worsening symptoms arise, the patient may return to the emergency room for additional workup.    [KL]      ED Course User Index  [KL] Leodis Liverpool, PA-C         Consults: none indicated  Impression:   Encounter Diagnosis   Name Primary?    Strain of flexor muscle of right hip, initial encounter Yes     Disposition:  Discharged    Discharge: Following the above history, physical exam, and studies, the patient was deemed stable and suitable for discharge. Patient was discharged with Robaxin and follow up with PCP and will return to [facility] ED as needed. Discharge medications and follow-up instructions were discussed with patient/patient's family. It was advised that the patient return to the ED if they develop new or any other concerning symptoms. The patient/patient's family verbalized understanding of all instructions and had no further questions or concerns.    Follow Up:   Carlene Coria, Jodelle Red, MD  [address]  [place] [address]  [phone]      As needed    [facility] - Emergency Department  [place] [phone]  [phone]    If symptoms worsen    Prescriptions:   Discharge Medication List as of 08/25/2019  8:03 PM        START taking these medications    Details   methocarbamoL (ROBAXIN) 500 mg Oral Tablet Take 1 Tablet (500 mg total) by mouth Four times a day for 10 days, Disp-40 Tablet, R-0, E-Rx              Future Appointments   Date Time Provider [place]   09/12/2019  5:00 PM Hartley Barefoot,  MD [place]   09/13/2019  2:45 PM Peter Garter, MD [facility]   09/21/2019  2:45 PM Leo Grosser, MD [place]       The co-signing faculty was physically present in the emergency department and available for consultation and did not participate in the care of this patient.    I am scribing for, and in the presence of, Leodis Liverpool PA-C for services provided on 08/25/2019.  Candice Chalybeate, SCRIBE   [place], [place]  08/25/2019,  19:50      I personally performed the services described in this documentation, as scribed  in my presence, and it is both accurate  and complete.    Leodis Liverpool, PA-C  Leodis Liverpool, PA-C  09/07/2019, 07:44

## 2019-08-29 ENCOUNTER — Encounter (INDEPENDENT_AMBULATORY_CARE_PROVIDER_SITE_OTHER): Payer: Self-pay | Admitting: INTERNAL MEDICINE CARDIOVASCULAR DISEASE

## 2019-08-29 NOTE — Nursing Note (Signed)
Phoned patient LM with call back number.  Pt was to have a PA appointment at [facility] on 08/25/19 for upcoming cardioversion on 09/01/18. Pt needs to have PA apt prior to procedure date or cardioversion will have to be rescheduled.  Hoy Finlay, RN  08/29/2019, 10:51

## 2019-08-30 ENCOUNTER — Ambulatory Visit (INDEPENDENT_AMBULATORY_CARE_PROVIDER_SITE_OTHER): Payer: Self-pay | Admitting: ANESTHESIOLOGY

## 2019-09-01 ENCOUNTER — Encounter (HOSPITAL_BASED_OUTPATIENT_CLINIC_OR_DEPARTMENT_OTHER): Payer: Commercial Managed Care - PPO | Admitting: Obstetrics & Gynecology

## 2019-09-06 ENCOUNTER — Ambulatory Visit (HOSPITAL_BASED_OUTPATIENT_CLINIC_OR_DEPARTMENT_OTHER): Payer: Commercial Managed Care - PPO | Admitting: Family Medicine

## 2019-09-12 ENCOUNTER — Encounter (HOSPITAL_BASED_OUTPATIENT_CLINIC_OR_DEPARTMENT_OTHER): Payer: Self-pay | Admitting: Family Medicine

## 2019-09-12 ENCOUNTER — Ambulatory Visit: Payer: Commercial Managed Care - PPO | Attending: Family Medicine | Admitting: Family Medicine

## 2019-09-12 ENCOUNTER — Other Ambulatory Visit: Payer: Self-pay

## 2019-09-12 VITALS — BP 138/74 | HR 74 | Temp 97.7°F | Resp 20 | Ht 64.02 in | Wt 362.9 lb

## 2019-09-12 DIAGNOSIS — R011 Cardiac murmur, unspecified: Secondary | ICD-10-CM | POA: Insufficient documentation

## 2019-09-12 DIAGNOSIS — M25562 Pain in left knee: Secondary | ICD-10-CM | POA: Insufficient documentation

## 2019-09-12 DIAGNOSIS — M545 Low back pain, unspecified: Secondary | ICD-10-CM

## 2019-09-12 DIAGNOSIS — M7918 Myalgia, other site: Secondary | ICD-10-CM

## 2019-09-12 DIAGNOSIS — M25569 Pain in unspecified knee: Secondary | ICD-10-CM

## 2019-09-12 DIAGNOSIS — Z6841 Body Mass Index (BMI) 40.0 and over, adult: Secondary | ICD-10-CM | POA: Insufficient documentation

## 2019-09-12 DIAGNOSIS — M25561 Pain in right knee: Secondary | ICD-10-CM | POA: Insufficient documentation

## 2019-09-12 MED ORDER — GABAPENTIN 100 MG CAPSULE
100.00 mg | ORAL_CAPSULE | Freq: Three times a day (TID) | ORAL | 2 refills | Status: DC
Start: 2019-09-12 — End: 2020-04-16

## 2019-09-12 NOTE — Progress Notes (Signed)
Subjective: The pt is a 64 y.o. year-old female whose PCP is Dr. Ashok Croon who reports to acupuncture clinic to discuss pain control. Sh has several area of pain that she needs help with. She has some bilateral knee pain which is significant. She is obese and this causes knee pain. She is scheduled to bariatric surgery at the end of July 2021 to help with weight so that she can have bilateral knee replacements. She also recently strained her right hip flexor which was excruciating. She went to the ED for this and was given this diagnosis and was placed on prednisone. She thinks she injured her flexor by swinging her leg into the car and into the bed.  She is taking robaxin and prednisone to help with pain. She feels that she may have right sided sciatic pain. Knee pain has been present since 2009. She has had injections which have not helped. She also reports N/T in her back, to her right buttock down into her right leg. She feels as though she is wearing pantyhose on her right leg but she is not. N/T in both feet.     PMH: CKD, a-fib, HTN, OA, uterine cancer, bilateral knee pain, obesity, bilateral pedal edema, asthma, GERD    ROS: The patient denies fever, chills, night sweats, and sudden change in weight. No HA. Some blurry vision. No pharyngitis. Pt denies SOB or coughing. No CP or palpitations. No abdominal pain, nausea, vomiting, diarrhea. Chronic constipation. No dysuria.Bilateral knee pain. Low back discomfort with radiation of N/T into he right leg. Right hip pain.     Meds: acetaminophen (TYLENOL 8 HOUR) 650 mg Oral Tablet Sustained Release, Take 1,300 mg by mouth Twice daily  albuterol sulfate (PROVENTIL OR VENTOLIN OR PROAIR) 90 mcg/actuation Inhalation HFA Aerosol Inhaler, Take 2 Puffs by inhalation Every 6 hours as needed  budesonide-formoterol (SYMBICORT) 160-4.5 mcg/actuation Inhalation HFA Aerosol Inhaler, Take 2 Puffs by inhalation Twice daily  dilTIAZem (CARDIZEM CD) 240 mg Oral Capsule,  Sust. Release 24 hr, Take 1 Capsule (240 mg total) by mouth Once a day  ELIQUIS 5 mg Oral Tablet, TK 1 T PO BID  esomeprazole magnesium (NEXIUM) 20 mg Oral Capsule, Delayed Release(E.C.), Take 20 mg by mouth Once a day  (Patient not taking: Reported on 08/11/2019)  fluticasone propionate (FLONASE) 50 mcg/actuation Nasal Spray, Suspension, 2 Sprays by Each Nostril route Once per day as needed   furosemide (LASIX) 40 mg Oral Tablet, TAKE 1 TABLET BY MOUTH EVERY DAY (Patient taking differently: Take 60 mg by mouth Once a day Takes 1 1/2 tablet daily)  hydrALAZINE (APRESOLINE) 25 mg Oral Tablet, Take 1 Tablet (25 mg total) by mouth Three times a day (Patient not taking: Reported on 08/08/2019)  Ibuprofen (MOTRIN) 600 mg Oral Tablet, Take 600 mg by mouth Once per day as needed for Pain  losartan (COZAAR) 50 mg Oral Tablet, Take 2 Tabs (100 mg total) by mouth Once a day (Patient taking differently: Take 100 mg by mouth Once a day patient only taking half a dose as BP's have been running low)  melatonin 5 mg Oral Tablet, Chewable, Take 5 mg by mouth Every night   metoprolol succinate (TOPROL-XL) 25 mg Oral Tablet Sustained Release 24 hr, Take 1 Tab (25 mg total) by mouth Once a day    No facility-administered medications prior to visit.    Surgical history:   Past Surgical History:   Procedure Laterality Date    CARDIOVERSION  COLONOSCOPY      GASTROSCOPY N/A 08/11/2019    Performed by Kristin Bruins, MD at [facility]      HX [place]      HX HYSTERECTOMY      LAPAROSCOPIC TOTAL HYSTERECTOMY      SINUS SURGERY  2002/07/29     SH: From [facility]. Married. She worked in a call center and as a Psychologist, counselling. No tobacco. No alcohol use. Second hand smoke exposure.     FH: mother with CAD, COPD, bilateral leg pain. Father with DM, HTN, CAD, died in 28-Jul-2001 from [place]. Brother with a CVA and obesity. Brother with colon cancer. Sister with back problem.     Allergies:   Allergies   Allergen  Reactions    Chocolate [Cocoa]     Ciprofloxacin     Peanut Butter Flavor [Flavoring Agent]      Makes mouth numb    Prednisone      High doses  Other reaction(s): Hypertension (intolerance), Other (See Comments), Other (See Comments)  Increased BP  Raises her blood pressure      Protonix [Pantoprazole]     Sulfa (Sulfonamides)        Objective: BP 138/74    Pulse 74    Temp 36.5 C (97.7 F) (Thermal Scan)    Resp 20    Ht 1.626 m (5' 4.02")    Wt (!) 165 kg (362 lb 14 oz)    SpO2 93%    BMI 62.26 kg/m   Constitutional: NAD  HEENT: TM's clear, non-bulging. PERRL.  No cervical lymphadenopathy.  Respiratory: CTA bilaterally. No wheezes, rhonchi, rales or crackles.  CV: irregularly irregular rhythm. II/VI systolic murmur.   Abd: bowel sounds present in all 4 quadrants. Non-tender.  Extremity: 3+ pitting edema.  Integument: no rash. Bullae on the shins.  Neuro: CN II-XII grossly intact. Pt is not able to get onto the exam table due to weakness in the right leg.   Musculoskeletal: pt in a wheel chair. No deformity of the spine. Palpation of the spine reveals pain in the lumbar and gluteal regions. Pain over the greater trochanter. Pain with palpation of the right thigh. Pain with palpation of the knee joint line. Crepitus.     Assessment and Plan:   1.Low back pain: pt does have pain with palpation in this area. Would recommend acupuncture in the form of lumbar PENS and channel input. Continue tylenol. Pt was advised to avoid NSAIDs due to CKD.   2.Gluteal pain; this is musculoskeletal but is also causing sciatica. Will do acupuncture, added gabapentin today.   3.Knee pain: recommend periosteal treatment and weight loss.   4.Morbid obesity: pt scheduled for weight loss surgery next month which will likely help with all of her chronic pain issues.   5.New heart murmur: this was heard on exam today. Will obtain echo.     Yetta Glassman. Alton Revere, MD

## 2019-09-13 ENCOUNTER — Encounter (HOSPITAL_BASED_OUTPATIENT_CLINIC_OR_DEPARTMENT_OTHER): Payer: Self-pay | Admitting: Physical Medicine & Rehabilitation

## 2019-09-13 ENCOUNTER — Encounter (INDEPENDENT_AMBULATORY_CARE_PROVIDER_SITE_OTHER): Payer: Self-pay | Admitting: INTERNAL MEDICINE CARDIOVASCULAR DISEASE

## 2019-09-13 ENCOUNTER — Encounter (HOSPITAL_BASED_OUTPATIENT_CLINIC_OR_DEPARTMENT_OTHER): Payer: Self-pay | Admitting: Family Medicine

## 2019-09-13 ENCOUNTER — Telehealth (INDEPENDENT_AMBULATORY_CARE_PROVIDER_SITE_OTHER): Payer: Self-pay | Admitting: Medical

## 2019-09-13 NOTE — Telephone Encounter (Addendum)
Spoke with the patient, advised of the message per A. O'kernick Pac. No further concerns at this time.   Youlanda Roys, LPN  08/13/5636, 75:64      ----- Message from Greer Pickerel, PA-C sent at 09/13/2019 12:04 PM EDT -----  Regarding: RE: Prescription Question  Contact: [phone]  Neither Dr. Deatra Canter nor I am aware of any adverse effects for taking Gabapentin with Afib.   Ashlee O'Kernick, PA-C  09/13/2019, 12:05      ----- Message -----  From: Youlanda Roys, LPN  Sent: 09/10/628   8:21 AM EDT  To: Mariane Duval O'Kernick, PA-C  Subject: FW: Prescription Question                        Please advise.   Thanks,   [employer]  ----- Message -----  From: Erie Noe, RN  Sent: 09/13/2019   7:57 AM EDT  To: [id number] Triage  Subject: FW: Prescription Question                          ----- Message -----  From: [name]  Sent: 09/13/2019   7:35 AM EDT  To: [facility] Triage Nurses  Subject: Prescription Question                            Dr Deatra Canter,    I was prescribed Gabapentin for chronic arthritic pain.  What is your opinion on this drug in relation to AFIB?   During my examination yesterday Dr Alton Revere also heard a heart murmur, and has scheduled me for an echocardiogram.  Please get back with me ASAP.    I have appt coming up with you this month.    Thank you,  [name]

## 2019-09-14 ENCOUNTER — Ambulatory Visit (HOSPITAL_BASED_OUTPATIENT_CLINIC_OR_DEPARTMENT_OTHER): Payer: Self-pay | Admitting: Family Medicine

## 2019-09-14 NOTE — Telephone Encounter (Signed)
Regarding: Advise/Unger  ----- Message from Laurann Montana sent at 09/14/2019  8:27 AM EDT -----  Arley Phenix, MD    Hello!  The pt stated that Dr. Alton Revere gave her gabapentin (NEURONTIN) 100 mg Oral Capsule and it is causing her to vomit. Please advise the pt at [phone]   Thanks!  [employer]

## 2019-09-14 NOTE — Telephone Encounter (Signed)
Patient reports taking one gabapentin yesterday and sleeping for 2-3 hours.  When she woke up, she started vomiting and then having diarrhea.  She has not taken any further gabapentin and continues to report nausea and diarrhea.  She reports taking one Imodium.  She is not going to take any more gabapentin and instead would like to try Cymbalta as discussed. (Also reports feeling the gabapentin was going to work for her.)  Please advise.    Barbaraann Rondo, RN

## 2019-09-16 ENCOUNTER — Other Ambulatory Visit (HOSPITAL_BASED_OUTPATIENT_CLINIC_OR_DEPARTMENT_OTHER): Payer: Self-pay | Admitting: Family Medicine

## 2019-09-16 DIAGNOSIS — R011 Cardiac murmur, unspecified: Secondary | ICD-10-CM

## 2019-09-16 MED ORDER — DULOXETINE 20 MG CAPSULE,DELAYED RELEASE
20.00 mg | DELAYED_RELEASE_CAPSULE | Freq: Every day | ORAL | 2 refills | Status: DC
Start: 2019-09-16 — End: 2019-10-11

## 2019-09-16 NOTE — Telephone Encounter (Signed)
I spoke to the pt today. She has some N/V with gabapentin, she has discontinued this.     Will start Cymbalta 20 mg.     Tillie Rung E. Alton Revere, MD

## 2019-09-20 ENCOUNTER — Encounter (HOSPITAL_BASED_OUTPATIENT_CLINIC_OR_DEPARTMENT_OTHER): Payer: Self-pay

## 2019-09-21 ENCOUNTER — Encounter (HOSPITAL_COMMUNITY): Payer: Self-pay

## 2019-09-21 ENCOUNTER — Encounter (INDEPENDENT_AMBULATORY_CARE_PROVIDER_SITE_OTHER): Payer: Commercial Managed Care - PPO | Admitting: INTERNAL MEDICINE CARDIOVASCULAR DISEASE

## 2019-09-29 ENCOUNTER — Ambulatory Visit (HOSPITAL_BASED_OUTPATIENT_CLINIC_OR_DEPARTMENT_OTHER): Payer: Self-pay | Admitting: Family Medicine

## 2019-09-29 NOTE — Telephone Encounter (Signed)
Regarding: Advise/Unger  ----- Message from Laurann Montana sent at 09/28/2019  2:12 PM EDT -----  Arley Phenix, MD    Hello!  The pt stated that the Cymbalta that Dr. Alton Revere prescribed is not helping her pain. Please advise the pt at [phone]   Thanks!  [employer]

## 2019-09-30 NOTE — Telephone Encounter (Signed)
Hartley Barefoot, MD  [place] [facility] Provider Nurses 28 minutes ago (8:12 AM)     Please let pt know that this medication takes about 4-6 weeks to kick in. I can increase to 30 mg now if she would like.     Alexandra Nunez. Alton Revere, MD    Message text      Called and spoke with patient-  Notified of message per Dr. Alton Revere. Patient states that she would like to try Cymbalta 30 mg. Please send to below pharmacy.  Preferred [facility] [place] [facility] [place]    [address] [place] [address] [place] [address]    Phone: [phone] Fax: [phone]    Hours: Not open 24 hours      Roxas Clymer Reita Cliche, RN

## 2019-10-04 ENCOUNTER — Telehealth (HOSPITAL_BASED_OUTPATIENT_CLINIC_OR_DEPARTMENT_OTHER): Payer: Self-pay | Admitting: NURSE PRACTITIONER

## 2019-10-04 ENCOUNTER — Ambulatory Visit (HOSPITAL_BASED_OUTPATIENT_CLINIC_OR_DEPARTMENT_OTHER): Payer: Self-pay | Admitting: Family Medicine

## 2019-10-04 NOTE — Telephone Encounter (Signed)
Akkary referral- Pt sees Alexandra Nunez in [place]

## 2019-10-06 ENCOUNTER — Ambulatory Visit (HOSPITAL_COMMUNITY): Payer: Commercial Managed Care - PPO

## 2019-10-11 ENCOUNTER — Other Ambulatory Visit (HOSPITAL_BASED_OUTPATIENT_CLINIC_OR_DEPARTMENT_OTHER): Payer: Self-pay | Admitting: FAMILY PRACTICE

## 2019-10-11 ENCOUNTER — Ambulatory Visit (HOSPITAL_BASED_OUTPATIENT_CLINIC_OR_DEPARTMENT_OTHER): Payer: Self-pay | Admitting: Family Medicine

## 2019-10-11 MED ORDER — DULOXETINE 20 MG CAPSULE,DELAYED RELEASE
20.00 mg | DELAYED_RELEASE_CAPSULE | Freq: Every day | ORAL | 3 refills | Status: AC
Start: 2019-10-11 — End: ?

## 2019-10-11 NOTE — Telephone Encounter (Signed)
The patient follows with Dr. Deatra Canter in [place].  Please send to [facility].

## 2019-10-12 ENCOUNTER — Telehealth: Payer: Self-pay | Admitting: Internal Medicine

## 2019-10-12 NOTE — Telephone Encounter (Signed)
Dr. Melvyn Novas, you last saw this patient 02/16/2019, please advise.  Patient having bariatric surgery, and needs surgical clearance- wanting to do surgery at the end of July. Wanting to do televisit due to patient living in [place]. Patient wanting to know if [facility] will write clearance letter without seeing patient.

## 2019-10-13 ENCOUNTER — Ambulatory Visit (INDEPENDENT_AMBULATORY_CARE_PROVIDER_SITE_OTHER): Payer: Self-pay | Admitting: INTERNAL MEDICINE CARDIOVASCULAR DISEASE

## 2019-10-13 NOTE — Telephone Encounter (Addendum)
Spoke to pt and let her know that the testing Dr Deatra Canter prefers her to have can only be completed in [place]. Pt is to contact [facility] to get testing scheduled as they had tried contacting her in April to get scheduled. Pt verbalized an understanding w/ no additional questions.  Hoy Finlay, RN  10/13/2019, 09:53      ----- Message from Georgina Snell sent at 10/13/2019  8:48 AM EDT -----  Pt called and asked if she has been scheduled for a stress test yet, She said Dr.Ali wants to her to have one before her bariatric surgery end of July. I don't see an order in chart?

## 2019-10-13 NOTE — Telephone Encounter (Signed)
LMTCB x 1 

## 2019-10-13 NOTE — Telephone Encounter (Signed)
Ok to set up as televisit as add on and tell her I will just work the call in at [employer]

## 2019-10-13 NOTE — Telephone Encounter (Signed)
appt scheduled

## 2019-10-17 ENCOUNTER — Ambulatory Visit (INDEPENDENT_AMBULATORY_CARE_PROVIDER_SITE_OTHER): Payer: Medicare PPO | Admitting: Internal Medicine

## 2019-10-17 ENCOUNTER — Encounter: Payer: Self-pay | Admitting: Internal Medicine

## 2019-10-17 ENCOUNTER — Other Ambulatory Visit: Payer: Self-pay

## 2019-10-17 DIAGNOSIS — J4540 Moderate persistent asthma, uncomplicated: Secondary | ICD-10-CM

## 2019-10-17 NOTE — Progress Notes (Signed)
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 Last seen  on April 13, 2006 with the  recommendation that she maintain Symbicort at 160/4.5 two puffs b.i.d.  Take empiric Protonix at 40 mg b.i.d. before meals, which she failed to  do, and try Singulair 10 mg q.p.m. She said that Singulair did  nothing for her, and stopped it after a couple of weeks but is  convinced that Symbicort is helping, and that she is using less  albuterol than normal. It turns out, however, that she is still using  albuterol 4 or 5 times a day. She states she does not typically wake up  at night and need it.   Pulmonary function tests were reviewed from January 22, and indicate an  FEV1 of only 56% predicted with a ratio of 53% and a 15% improvement  after bronchodilators. rec gerd rx plus symbicort 160 2bid > all symptoms resolved once learned technique    08/24/2013 1st  office visit/ Linkon Siverson   Off gerd rx/ on symbicort 160 2bid and ACEi now Chief Complaint  Patient presents with  . Advice Only    Old MW pt to reestablish care for Asthma, sleep study.   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 Why wakes 8 am not refreshed funny in the head and feels drowsy 10- noon. Only drives 10 min and on trips gets drowsy but husband always drives her.  rec Stop lisinopril Start micardis 80 mg one half daily  You need to breath clean air to reduce your risk of asthma flares  Read for 30 min before bed nightly - if you do wake up no light We will schedule you for a sleep study in one month     07/01/2016   Extended  ov/Kimball Manske re: reestablish re asthma / back on ACEi Chief  Complaint  Patient presents with  . Follow-up    asthma doingok, insurance will pay for symbicort but the copay is too high  for the past month has needed alb twice daily including one neb rx which is unusual for her with progressive doe "just about anything" and assoc dry day > noct cough  Difficulty sleeping due to nasal congestion/ cough sleeping mostly L side now rec Stop lisinopril and start losartan 50 mg daily in it's place, increase lasix as per your PC  Continue symbicort 160 Take 2 puffs first thing in am and then another 2 puffs about 12 hours later.  Work on inhaler technique:   Only use your albuterol as a rescue medication  Whenever you are coughing > Try prilosec otc 20mg   Take 30-60 min before first meal of the day and Pepcid ac (famotidine) 20 mg one @  bedtime until cough is completely gone for at least a week without the need for cough suppression GERD diet        08/01/2016  Extended f/u ov/Tazaria Dlugosz re:  dtc asthma on symb 160 2bid and avg saba once daily  Chief Complaint  Patient presents with  . Follow-up  4wk rov- pt states there was no improvment in breathing the first three weeks after last OV, pt had switched back to lisinopril due to losartan not controlling BP. pt reports occ non prod cough clear mucus & mild wheezing  thinks better not due to off acei but due to allergies  better since rained so changed back to acei instead of arb  Using saba q hs x early March 2018 which is a new issue for her and remains a problem, thinks may be due to noct HB rec Plan A = Automatic = Symbicort 80 Take 2 puffs first thing in am and then another 2 puffs about 12 hours later.  Plan B = Backup Only use your albuterol(ventolin)  Whenever cough/ wheeze for any reason or having heart burn >  Try prilosec otc 20mg   Take 30-60 min before first meal of the day and Pepcid ac (famotidine) 20 mg one @  bedtime until cough is completely gone for at least a week without the need for cough  suppression GERD .  I f breathing/ wheezing / coughing getting worse on this regimen,  You will either need to change back to an ARB (losartan is the cheapest but may not be the best) and off lisinopril or Let Dr [name] or your Primary doctor refer you to another specialist for example an Allergist / asthma specialist in [facility] as there is nothing more I can do for you in this circumstance. Please schedule a follow up office visit in 4 weeks, sooner if needed with medication formulary > did not do   email 09/28/16: "You were right about the Lisinopril and for the most part I don't have to much congestion. I still have some, but I still feel that symbicort and albuterol are handling it pretty good. Not perfect, but pretty good. "    11/07/2016 extended acute f/u ov/Jessi Pitstick re: re-establish re cough/sob [name] saba use   Chief Complaint  Patient presents with  . Acute Visit    Increased cough since June 2018- prod with clear to white sputum.  She is using her albuterol inhaler 3-4 x per day on average.   worse with cough/ wheeze x 2 months not on 80 not on ppi/h2hs  Not able to verify meds as did not bring them as requested, seeing multiple providers "each tells me something different"  rec Depomedrol 120 mg IM today  Prilosec 20 mg x 2 x 30 min before bfast and supper and take pepcid 20 mg at bedtime  For drainage / throat tickle try take CHLORPHENIRAMINE  4 mg - take one every 4 hours as needed - available over the counter- may cause drowsiness so start with just a bedtime dose or two and see how you tolerate it before trying in daytime Plan A = Automatic = Symbicort 80 Take 2 puffs first thing in am and then another 2 puffs about 12 hours later.  Plan B = Backup Only use your albuterol as a rescue medication For cough you can use robitussin and supplement with tessalon 100 up to 2 every 6 hours as needed        02/09/17 NP eval Med cal Pt assistance for symb   04/22/2017  f/u  ov/Kent Riendeau re:  Asthma / uacs/ no med cal  Chief Complaint  Patient presents with  . Acute Visit    Pt states her breathing has been progressively worse since last ov Aug 2018.  She has also been coughing more  with clear to white.  She states she gets SOB with walking just a few steps.  She states she is having trouble sleeping due to cough.  She is using her albuterol inhaler at least 4 x per day.   sleeps propped up on 2 pillows L side / no 02 and uses avg alb one x per noct  Cough is more daytime min white mucus assoc with sob x a a few steps x 6 m gradually worse Has not been able to take ppi as instructed due to diarrhea with protonix rec GERD  Stop protonix and start aciphex 20 mg Take 30- 60 min before your first and last meals of the day  Change symbicort to 160 = dulera 200 Take 2 puffs first thing in am and then another 2 puffs about 12 hours later.  Only use your albuterol (Ventolin) as a rescue medication   If all else fails > Prednisone 10 mg take  4 each am x 2 days,   2 each am x 2 days,  1 each am x 2 days and stop    11/13/2017  f/u ov/Chidinma Clites re: asthma with component of uacs flared off gerd rx  Chief Complaint  Patient presents with  . Acute Visit    She c/o prod cough for the past 6 wks- clear to light yellow sputum.    Dyspnea:  Limited by knees > sob  Cough: worse x 2 weeks since stopped ppi / nexium  SABA use: hardly ever 02: no   Now living part time in [place] taking care of her mother  rec Resume nexium 20 mg Take 30-60 min before first meal of the day  GERD (REFLUX)  If breathing cough or congestion worse > Prednisone 10 mg take  4 each am x 2 days,   2 each am x 2 days,  1 each am x 2 days and stop     Virtual Visit via Telephone Note 02/16/2019  Re chronic asthma  I connected with [name] on 02/16/19 at  8:45 AM EST by telephone and verified that I am speaking with the correct person using two identifiers.   I discussed the limitations, risks, security  and privacy concerns of performing an evaluation and management service by telephone and the availability of in person appointments. I also discussed with the patient that there may be a patient responsible charge related to this service. The patient expressed understanding and agreed to proceed.   History of Present Illness: unable to take ppi daily due to diarrhea and can't afford aciphex so takes qod nexium and tol ok  Dyspnea:  Knees stop her first/ very limited  25 ft to car,online shopping Cough: assoc with pnds / better with symbicort / white mucus min amt  Sleeping: angled up with pillows to get to about 30 degree SABA use: rarely 02: none  rec If get worse > Prednisone 10 mg take  4 each am x 2 days,   2 each am x 2 days,  1 each am x 2 days and stop  For drainage / throat tickle try take CHLORPHENIRAMINE  4 mg  (Chlortab 4mg   at [employer] should be easiest to find in the green box)  take one every 4 hours as needed - available over the counter- may cause drowsiness so start with just a bedtime dose or two and see how you tolerate it before trying in daytime   Other option for nasal symptoms, coughing =  trial of singulair as don't see any evidence in Epic that it has been tried.  Set up televisit in 3 months  - call sooner if needed   Virtual Visit via Telephone Note 10/17/2019   I attempted connection with [name] on 10/17/19 at 12 noon and 1:25 pm  And 5 pm EDT by telephone and got no response

## 2019-10-17 NOTE — Assessment & Plan Note (Signed)
No response to televisit attempt

## 2019-10-18 ENCOUNTER — Other Ambulatory Visit: Payer: Self-pay

## 2019-10-18 ENCOUNTER — Ambulatory Visit (HOSPITAL_BASED_OUTPATIENT_CLINIC_OR_DEPARTMENT_OTHER): Payer: Self-pay | Admitting: Family Medicine

## 2019-10-18 ENCOUNTER — Ambulatory Visit
Admission: RE | Admit: 2019-10-18 | Discharge: 2019-10-18 | Disposition: A | Discharge status: Home / Self Care | Payer: Commercial Managed Care - PPO | Source: Ambulatory Visit | Attending: INTERNAL MEDICINE CARDIOVASCULAR DISEASE | Admitting: INTERNAL MEDICINE CARDIOVASCULAR DISEASE

## 2019-10-18 DIAGNOSIS — Z9889 Other specified postprocedural states: Secondary | ICD-10-CM | POA: Insufficient documentation

## 2019-10-18 DIAGNOSIS — I10 Essential (primary) hypertension: Secondary | ICD-10-CM | POA: Insufficient documentation

## 2019-10-18 DIAGNOSIS — I4819 Other persistent atrial fibrillation: Secondary | ICD-10-CM | POA: Insufficient documentation

## 2019-10-18 MED ORDER — REGADENOSON 0.4 MG/5 ML INTRAVENOUS SYRINGE
0.40 mg | INJECTION | INTRAVENOUS | Status: AC
Start: 2019-10-18 — End: 2019-10-18
  Administered 2019-10-18: 12:00:00 0.4 mg via INTRAVENOUS

## 2019-10-18 NOTE — Progress Notes (Signed)
Stress Test Data    10/18/2019     Clinical Problem: DOE    Hemodynamic Data  Supine: HR 53 bpm   BP 118/66    1.  Reason for Termination: protocol complete    2.  Significant Symptoms: none    3.  Treadmill Protocol: N/A    4.  Blood Pressure Response: 125/63     5.  ST Changes: none    6.  Arrhythmias: none    7. Peak Capacity METS: 1.00      Total Minutes: 1:00    8. Med Dosage:       Lexiscan: 0.4 mg    9. Type of Test: PET MPS    Baseline ECG: SB    Chronotropic Response: N/A    Heart Rate Recovery: N/A    Exercise Capacity: N/A    Pre Test Probability: intermediate    Post Test Probability: N/A    [facility] Treadmill Score: N/A    Supervising Staff: Dr. Milana Kidney

## 2019-10-18 NOTE — Nurses Notes (Signed)
Pre-Lexiscan assessment:  BP 118/66.  Lungs clear.  No wheezing auscultated.  Denies chest pain at this time.  Denies SOB at this time but does get DOE.  Denies hx CVA.  Denies hx seizures.  + hx asthma and uses Symbicort 2 puffs twice daily with last use yesterday and also uses Albuterol rescue inhaler and was last used "a week ago".  Recently tested for sleep apnea with results pending.  Does not currently use oxygen.  Also stated, "about 10 years ago"  had a possible reaction of "the chemical" resulting in wheezing but unsure if  was due to anxiety or reaction to "chemical."  Ronalee Belts, EKG tech, notified of past experience with stress test.

## 2019-10-18 NOTE — Nurses Notes (Signed)
Tolerated Lexiscan well.  Denied any chest or SOB associated with Lexiscan.  Complain of headache with partial relief with sips of Coke.

## 2019-10-19 LAB — PET CT: PERFUSION STRESS W/PHARM INT - PART 2 - ECG ADULT
INFUSION DOSE 1 HR: 56 {beats}/min
INFUSION DOSE 1 HR: 77 {beats}/min
MAX WORK LOAD: 10
Max Heart Rate: 80 {beats}/min
Peak Diastolic BP for Stress Tests: 63 mmHg
Peak Systolic BP Stress Test: 127 mmHg
Predicted Max HR: 157 {beats}/min
SUPINE PREINFSN HR: 56 {beats}/min

## 2019-10-25 ENCOUNTER — Ambulatory Visit (HOSPITAL_BASED_OUTPATIENT_CLINIC_OR_DEPARTMENT_OTHER): Payer: Self-pay | Admitting: Family Medicine

## 2019-10-25 ENCOUNTER — Telehealth: Payer: Self-pay | Admitting: Internal Medicine

## 2019-10-25 NOTE — Telephone Encounter (Signed)
Called and left message for patient to return call.  

## 2019-10-25 NOTE — Telephone Encounter (Signed)
Pt returned call. Stated to Alexis [name] that it was okay to schedule her for a televisit as her last one 7/12 MW was not able to reach her. Nothing further needed.

## 2019-10-25 NOTE — Telephone Encounter (Signed)
Pt called back. Alexis [name] cleared pt for televisit. I offered soonest available appt time and pt said it wasn't soon enough and requested I send a message to Dr. Melvyn Novas. Pt says she needs surgical clearance from dr. Melvyn Novas specifically and not an NP. Please advise.

## 2019-10-25 NOTE — Telephone Encounter (Signed)
Pt can be scheduled for a televisit on a day that Dr. Melvyn Novas is working in [place] or when he is working in [place].  Attempted to call pt but unable to reach. Left message for her to return call.  When pt calls back, please look at both [facility] and [place] schedules. Thanks!

## 2019-10-26 NOTE — Telephone Encounter (Signed)
Pt scheduled to have a televisit with MW on 7/22 at 9:45.  Nothing further needed at this time- will close encounter.

## 2019-10-26 NOTE — Telephone Encounter (Signed)
LMTCB x2  

## 2019-10-26 NOTE — Telephone Encounter (Signed)
Patient is returning phone call. Patient phone number is [phone].

## 2019-10-27 ENCOUNTER — Other Ambulatory Visit: Payer: Self-pay

## 2019-10-27 ENCOUNTER — Encounter: Payer: Self-pay | Admitting: Internal Medicine

## 2019-10-27 ENCOUNTER — Ambulatory Visit (INDEPENDENT_AMBULATORY_CARE_PROVIDER_SITE_OTHER): Payer: Medicare PPO | Admitting: Internal Medicine

## 2019-10-27 DIAGNOSIS — J4540 Moderate persistent asthma, uncomplicated: Secondary | ICD-10-CM | POA: Diagnosis not present

## 2019-10-27 NOTE — Assessment & Plan Note (Addendum)
Onset childhood  - rec trial off acei 08/24/2013 and again 07/01/2016 x 3 weeks only - Spirometry 07/01/2016  FEV1 1.21 (47%)  Ratio 69 mild curvature  - FENO 07/01/2016  =   39  - Allergy profile 08/01/2016 >  Eos 0.5 /  IgE  12 neg RAST  - 09/28/16 confirmed much better off acei (see email)  - 11/07/2016    try symbicort 80 2bid/ depomedrol 120 - 04/22/2017  After extensive coaching inhaler device  effectiveness =    75% > try symb 160 and avoid pred if possible / add gerd rx = aciphex as can't tol protonix - 11/13/2017 flared off ppi > resume plus pred x 6 days if not improving on ppi    All goals of chronic asthma control met including optimal function and elimination of symptoms with minimal need for rescue therapy.  Contingencies discussed in full including contacting this office immediately if not controlling the symptoms using the rule of two's.     [place] for bariatric surgery :   Dr Caleb Popp [phone]  Pt informed of the seriousness of COVID 19 infection as a direct risk to lung health  and safey and to close contacts and should continue to wear a facemask in public and minimize exposure to public locations but especially avoid any area or activity where non-close contacts are not observing distancing or wearing an appropriate face mask.  I strongly recommended she take either of the vaccines available through local drugstores based on updated information on millions of Americans treated with the [employer] products  which have proven both safe and  effective even against the new delta variant.

## 2019-10-27 NOTE — Progress Notes (Signed)
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 Last seen  on April 13, 2006 with the  recommendation that she maintain Symbicort at 160/4.5 two puffs b.i.d.  Take empiric Protonix at 40 mg b.i.d. before meals, which she failed to  do, and try Singulair 10 mg q.p.m. She said that Singulair did  nothing for her, and stopped it after a couple of weeks but is  convinced that Symbicort is helping, and that she is using less  albuterol than normal. It turns out, however, that she is still using  albuterol 4 or 5 times a day. She states she does not typically wake up  at night and need it.   Pulmonary function tests were reviewed from January 22, and indicate an  FEV1 of only 56% predicted with a ratio of 53% and a 15% improvement  after bronchodilators. rec gerd rx plus symbicort 160 2bid > all symptoms resolved once learned technique    08/24/2013 1st  office visit/ Vince Ainsley   Off gerd rx/ on symbicort 160 2bid and ACEi now Chief Complaint  Patient presents with  . Advice Only    Old MW pt to reestablish care for Asthma, sleep study.   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 Why wakes 8 am not refreshed funny in the head and feels drowsy 10- noon. Only drives 10 min and on trips gets drowsy but husband always drives her.  rec Stop lisinopril Start micardis 80 mg one half daily  You need to breath clean air to reduce your risk of asthma flares  Read for 30 min before bed nightly - if you do wake up no light We will schedule you for a sleep study in one month     07/01/2016   Extended  ov/Velvie Thomaston re: reestablish re asthma / back on ACEi Chief  Complaint  Patient presents with  . Follow-up    asthma doingok, insurance will pay for symbicort but the copay is too high  for the past month has needed alb twice daily including one neb rx which is unusual for her with progressive doe "just about anything" and assoc dry day > noct cough  Difficulty sleeping due to nasal congestion/ cough sleeping mostly L side now rec Stop lisinopril and start losartan 50 mg daily in it's place, increase lasix as per your PC  Continue symbicort 160 Take 2 puffs first thing in am and then another 2 puffs about 12 hours later.  Work on inhaler technique:   Only use your albuterol as a rescue medication  Whenever you are coughing > Try prilosec otc 20mg   Take 30-60 min before first meal of the day and Pepcid ac (famotidine) 20 mg one @  bedtime until cough is completely gone for at least a week without the need for cough suppression GERD diet        08/01/2016  Extended f/u ov/Patrese Neal re:  dtc asthma on symb 160 2bid and avg saba once daily  Chief Complaint  Patient presents with  . Follow-up  4wk rov- pt states there was no improvment in breathing the first three weeks after last OV, pt had switched back to lisinopril due to losartan not controlling BP. pt reports occ non prod cough clear mucus & mild wheezing  thinks better not due to off acei but due to allergies  better since rained so changed back to acei instead of arb  Using saba q hs x early March 2018 which is a new issue for her and remains a problem, thinks may be due to noct HB rec Plan A = Automatic = Symbicort 80 Take 2 puffs first thing in am and then another 2 puffs about 12 hours later.  Plan B = Backup Only use your albuterol(ventolin)  Whenever cough/ wheeze for any reason or having heart burn >  Try prilosec otc 20mg   Take 30-60 min before first meal of the day and Pepcid ac (famotidine) 20 mg one @  bedtime until cough is completely gone for at least a week without the need for cough  suppression GERD .  I f breathing/ wheezing / coughing getting worse on this regimen,  You will either need to change back to an ARB (losartan is the cheapest but may not be the best) and off lisinopril or Let Dr [name] or your Primary doctor refer you to another specialist for example an Allergist / asthma specialist in [facility] as there is nothing more I can do for you in this circumstance. Please schedule a follow up office visit in 4 weeks, sooner if needed with medication formulary > did not do   email 09/28/16: "You were right about the Lisinopril and for the most part I don't have to much congestion. I still have some, but I still feel that symbicort and albuterol are handling it pretty good. Not perfect, but pretty good. "    11/07/2016 extended acute f/u ov/Cniyah Sproull re: re-establish re cough/sob [name] saba use   Chief Complaint  Patient presents with  . Acute Visit    Increased cough since June 2018- prod with clear to white sputum.  She is using her albuterol inhaler 3-4 x per day on average.   worse with cough/ wheeze x 2 months not on 80 not on ppi/h2hs  Not able to verify meds as did not bring them as requested, seeing multiple providers "each tells me something different"  rec Depomedrol 120 mg IM today  Prilosec 20 mg x 2 x 30 min before bfast and supper and take pepcid 20 mg at bedtime  For drainage / throat tickle try take CHLORPHENIRAMINE  4 mg - take one every 4 hours as needed - available over the counter- may cause drowsiness so start with just a bedtime dose or two and see how you tolerate it before trying in daytime Plan A = Automatic = Symbicort 80 Take 2 puffs first thing in am and then another 2 puffs about 12 hours later.  Plan B = Backup Only use your albuterol as a rescue medication For cough you can use robitussin and supplement with tessalon 100 up to 2 every 6 hours as needed        02/09/17 NP eval Med cal Pt assistance for symb   04/22/2017  f/u  ov/Emi Lymon re:  Asthma / uacs/ no med cal  Chief Complaint  Patient presents with  . Acute Visit    Pt states her breathing has been progressively worse since last ov Aug 2018.  She has also been coughing more  with clear to white.  She states she gets SOB with walking just a few steps.  She states she is having trouble sleeping due to cough.  She is using her albuterol inhaler at least 4 x per day.   sleeps propped up on 2 pillows L side / no 02 and uses avg alb one x per noct  Cough is more daytime min white mucus assoc with sob x a a few steps x 6 m gradually worse Has not been able to take ppi as instructed due to diarrhea with protonix rec GERD  Stop protonix and start aciphex 20 mg Take 30- 60 min before your first and last meals of the day  Change symbicort to 160 = dulera 200 Take 2 puffs first thing in am and then another 2 puffs about 12 hours later.  Only use your albuterol (Ventolin) as a rescue medication   If all else fails > Prednisone 10 mg take  4 each am x 2 days,   2 each am x 2 days,  1 each am x 2 days and stop    11/13/2017  f/u ov/Jock Mahon re: asthma with component of uacs flared off gerd rx  Chief Complaint  Patient presents with  . Acute Visit    She c/o prod cough for the past 6 wks- clear to light yellow sputum.    Dyspnea:  Limited by knees > sob  Cough: worse x 2 weeks since stopped ppi / nexium  SABA use: hardly ever 02: no   Now living part time in [place] taking care of her mother  rec Resume nexium 20 mg Take 30-60 min before first meal of the day  GERD (REFLUX)  If breathing cough or congestion worse > Prednisone 10 mg take  4 each am x 2 days,   2 each am x 2 days,  1 each am x 2 days and stop     Virtual Visit via Telephone Note 02/16/2019  Re chronic asthma  I connected with [name] on 02/16/19 at  8:45 AM EST by telephone and verified that I am speaking with the correct person using two identifiers.   I discussed the limitations, risks, security  and privacy concerns of performing an evaluation and management service by telephone and the availability of in person appointments. I also discussed with the patient that there may be a patient responsible charge related to this service. The patient expressed understanding and agreed to proceed.   History of Present Illness: unable to take ppi daily due to diarrhea and can't afford aciphex so takes qod nexium and tol ok  Dyspnea:  Knees stop her first/ very limited  25 ft to car, online shopping Cough: assoc with pnds / better with symbicort / white mucus min amt  Sleeping: angled up with pillows to get to about 30 degree SABA use: rarely 02: none  rec If get worse > Prednisone 10 mg take  4 each am x 2 days,   2 each am x 2 days,  1 each am x 2 days and stop  For drainage / throat tickle try take CHLORPHENIRAMINE  4 mg  (Chlortab 4mg   at [employer] should be easiest to find in the green box)  take one every 4 hours as needed - available over the counter- may cause drowsiness so start with just a bedtime dose or two and see how you tolerate it before trying in daytime   Other option for nasal symptoms, coughing =  trial of singulair as don't see any evidence in Epic that it has been tried.  Set up televisit in 3 months  - call sooner if needed   Virtual Visit via Telephone Note 10/17/2019   I attempted connection with [name] on 10/17/19  at  12 noon and 1:25 pm  And 5 pm EDT by telephone and got no response    Virtual Visit via Telephone Note 10/27/2019   I connected with [name] on 10/27/19 at  8:30 AM EDT by telephone and verified that I am speaking with the correct person using two identifiers.   I discussed the limitations, risks, security and privacy concerns of performing an evaluation and management service by telephone and the availability of in person appointments. I also discussed with the patient that there may be a patient responsible charge related to this  service. The patient expressed understanding and agreed to proceed.   History of Present Illness:  Dyspnea:  Not limited by breathing from desired activities  But by knees  Cough: none  Sleeping: 30 degrees with pillows  SABA use: rarely 02: none  Wants to be cleared for bariatric surgery but hasn't had the vaccine yet - wants to wait until after    No obvious day to day or daytime variability or assoc excess/ purulent sputum or mucus plugs or hemoptysis or cp or chest tightness, subjective wheeze or overt sinus or hb symptoms.    Also denies any obvious fluctuation of symptoms with weather or environmental changes or other aggravating or alleviating factors except as outlined above.   Meds reviewed/ med reconciliation completed     No outpatient medications have been marked as taking for the 10/27/19 encounter (Appointment) with Tanda Rockers, MD.         Observations/Objective: Clear voice/ no conversational sob or cough during interview   Assessment and Plan: See problem list for active a/p's   Follow Up Instructions: See avs for instructions unique to this ov which includes revised/ updated med list     I discussed the assessment and treatment plan with the patient. The patient was provided an opportunity to ask questions and all were answered. The patient agreed with the plan and demonstrated an understanding of the instructions.   The patient was advised to call back or seek an in-person evaluation if the symptoms worsen or if the condition fails to improve as anticipated.  I provided 16  minutes of non-face-to-face time during this encounter.   Christinia Gully, MD

## 2019-10-27 NOTE — Patient Instructions (Addendum)
Dr Caleb Popp [phone]  You are cleared for surgery but I very strongly recommend you get the moderna or pfizer vaccine first based on your risk of dying from the virus  and the proven safety and benefit of these vaccines against even the delta variant   No change medications   Please schedule a follow up televisit in 12 months (or in person if you are back from [place])  but call sooner if needed

## 2019-10-28 ENCOUNTER — Ambulatory Visit (INDEPENDENT_AMBULATORY_CARE_PROVIDER_SITE_OTHER): Payer: Self-pay | Admitting: INTERNAL MEDICINE CARDIOVASCULAR DISEASE

## 2019-10-28 NOTE — Telephone Encounter (Signed)
-----   Message from Kathlynn Grate, Ambulatory Care Assistant sent at 10/27/2019  3:14 PM EDT -----  Pt checking on status of cardiac clearance.

## 2019-10-28 NOTE — Telephone Encounter (Signed)
Phoned patient LM with call back number.    [place], [place]  10/28/2019, 10:22

## 2019-10-31 ENCOUNTER — Encounter (INDEPENDENT_AMBULATORY_CARE_PROVIDER_SITE_OTHER): Payer: Self-pay | Admitting: INTERNAL MEDICINE CARDIOVASCULAR DISEASE

## 2019-11-02 ENCOUNTER — Encounter (HOSPITAL_BASED_OUTPATIENT_CLINIC_OR_DEPARTMENT_OTHER): Payer: Self-pay | Admitting: Family Medicine

## 2019-11-03 ENCOUNTER — Encounter (HOSPITAL_BASED_OUTPATIENT_CLINIC_OR_DEPARTMENT_OTHER): Payer: Self-pay | Admitting: Family Medicine

## 2019-11-04 MED ORDER — DULOXETINE 30 MG CAPSULE,DELAYED RELEASE
30.00 mg | DELAYED_RELEASE_CAPSULE | Freq: Every day | ORAL | 0 refills | Status: DC
Start: 2019-11-04 — End: 2019-12-19

## 2019-11-04 NOTE — Telephone Encounter (Signed)
Regarding: medicaiton Dosage increase /   ----- Message from Ladean Raya sent at 11/03/2019  1:21 PM EDT -----    Pt is wanting to  increase her dosage to 30 mg on the medicaiton below. Pt aslo wants to see if Dr Alton Revere would be willing to take her on as her pt in famiy medicine please call back advise     DULoxetine (CYMBALTA DR) 20 mg Oral Capsule, Delayed Release(E.C.) 90 Capsule 3 10/11/2019    Sig - Route: Take 1 Capsule (20 mg total) by mouth Once a day - Oral   Sent to pharmacy as: DULoxetine 20 mg capsule,delayed release (CYMBALTA DR)   Class: E-Rx   Non-formulary Exception Code: SUGAY/GE Formulary Info Available     Preferred [facility], [address] [place] [address]    Phone: [phone] Fax: [phone]    Hours: Not open 24 hours      Thank  You

## 2019-11-07 ENCOUNTER — Telehealth (HOSPITAL_BASED_OUTPATIENT_CLINIC_OR_DEPARTMENT_OTHER): Payer: Self-pay | Admitting: NURSE PRACTITIONER

## 2019-11-07 ENCOUNTER — Encounter (INDEPENDENT_AMBULATORY_CARE_PROVIDER_SITE_OTHER): Payer: Self-pay | Admitting: INTERNAL MEDICINE CARDIOVASCULAR DISEASE

## 2019-11-07 NOTE — Telephone Encounter (Signed)
Patient left me a voicemail stating she wanted an appt for an injection this week, I returned patients call and adv I could get her in this week for an appointment to discuss a referral for an injection or an order for one depending on what the provider feels is best however we have to do the visit, then get which ever injection (if its an option) approved by insurance then get it scheduled, or we refer her to the pain clinic for a consult. The injection is not done the same day. Patient then said " Never mind" and disconnected the call.

## 2019-11-08 ENCOUNTER — Ambulatory Visit (HOSPITAL_BASED_OUTPATIENT_CLINIC_OR_DEPARTMENT_OTHER): Payer: Self-pay | Admitting: Family Medicine

## 2019-11-14 ENCOUNTER — Encounter (HOSPITAL_BASED_OUTPATIENT_CLINIC_OR_DEPARTMENT_OTHER): Payer: Self-pay | Admitting: Family Medicine

## 2019-11-14 ENCOUNTER — Other Ambulatory Visit: Payer: Self-pay

## 2019-11-14 ENCOUNTER — Encounter (INDEPENDENT_AMBULATORY_CARE_PROVIDER_SITE_OTHER): Payer: Self-pay | Admitting: INTERNAL MEDICINE CARDIOVASCULAR DISEASE

## 2019-11-14 ENCOUNTER — Ambulatory Visit: Payer: Commercial Managed Care - PPO | Attending: Family Medicine | Admitting: Family Medicine

## 2019-11-14 ENCOUNTER — Ambulatory Visit (HOSPITAL_BASED_OUTPATIENT_CLINIC_OR_DEPARTMENT_OTHER): Payer: Commercial Managed Care - PPO

## 2019-11-14 VITALS — BP 114/50 | HR 55 | Temp 98.7°F | Resp 18

## 2019-11-14 DIAGNOSIS — M25562 Pain in left knee: Secondary | ICD-10-CM

## 2019-11-14 DIAGNOSIS — M545 Low back pain, unspecified: Secondary | ICD-10-CM

## 2019-11-14 DIAGNOSIS — Z23 Encounter for immunization: Secondary | ICD-10-CM | POA: Insufficient documentation

## 2019-11-14 DIAGNOSIS — M25569 Pain in unspecified knee: Secondary | ICD-10-CM | POA: Insufficient documentation

## 2019-11-14 DIAGNOSIS — M25561 Pain in right knee: Secondary | ICD-10-CM

## 2019-11-14 NOTE — Patient Instructions (Signed)
1 Revised: 30 September 2019      FACT SHEET FOR RECIPIENTS AND CAREGIVERS   EMERGENCY USE AUTHORIZATION (EUA) OF   THE PFIZER-BIONTECH COVID-19 VACCINE TO PREVENT CORONAVIRUS DISEASE 2019 (COVID-19)   IN INDIVIDUALS 63 YEARS OF AGE AND OLDER   You are being offered the Pfizer-BioNTech COVID-19 Vaccine to prevent Coronavirus Disease 2019 (COVID-19) caused by SARS-CoV-2. This Fact Sheet contains information to help you understand the risks and benefits of the Pfizer-BioNTech COVID-19 Vaccine, which you may receive because there is currently a pandemic of COVID-19.   The Pfizer-BioNTech COVID-19 Vaccine is a vaccine and may prevent you from getting COVID-19. There is no U.S. Food and Drug Administration (FDA) approved vaccine to prevent COVID-19.   Read this Fact Sheet for information about the Pfizer-BioNTech COVID-19 Vaccine. Talk to the vaccination provider if you have questions. It is your choice to receive the Pfizer-BioNTech COVID-19 Vaccine.   The Pfizer-BioNTech COVID-19 Vaccine is administered as a 2-dose series, 3 weeks apart, into the muscle.   The Pfizer-BioNTech COVID-19 Vaccine may not protect everyone.   This Fact Sheet may have been updated. For the most recent Fact Sheet, please see www.cvdvaccine.com.   WHAT YOU NEED TO KNOW BEFORE YOU GET THIS VACCINE   WHAT IS COVID-19?   COVID-19 disease is caused by a coronavirus called SARS-CoV-2. This type of coronavirus has not been seen before. You can get COVID-19 through contact with another person who has the virus. It is predominantly a respiratory illness that can affect other organs. People with COVID-19 have had a wide range of symptoms reported, ranging from mild symptoms to severe illness. Symptoms may appear 2 to 14 days after exposure to the virus. Symptoms may include: fever or chills; cough; shortness of breath; fatigue; muscle or body aches; headache; new loss of taste or smell; sore throat; congestion or runny nose; nausea or vomiting;  diarrhea.   WHAT IS THE PFIZER-BIONTECH COVID-19 VACCINE?   The Pfizer-BioNTech COVID-19 Vaccine is an unapproved vaccine that may prevent COVID-19. There is no FDA-approved vaccine to prevent COVID-19. 2 Revised: 30 September 2019     The FDA has authorized the emergency use of the Pfizer-BioNTech COVID-19 Vaccine to prevent COVID-19 in individuals 63 years of age and older under an Emergency Use Authorization (EUA).   For more information on EUA, see the "What is an Emergency Use Authorization (EUA)?" section at the end of this Fact Sheet.   WHAT SHOULD YOU MENTION TO YOUR VACCINATION PROVIDER BEFORE YOU GET THE PFIZER-BIONTECH COVID-19 VACCINE?   Tell the vaccination provider about all of your medical conditions, including if you:   . . have any allergies   . . have had myocarditis (inflammation of the heart muscle) or pericarditis (inflammation of the lining outside the heart)   . . have a fever   . . have a bleeding disorder or are on a blood thinner   . . are immunocompromised or are on a medicine that affects your immune system   . . are pregnant or plan to become pregnant   . . are breastfeeding   . . have received another COVID-19 vaccine   . . have ever fainted in association with an injection     WHO SHOULD GET THE PFIZER-BIONTECH COVID-19 VACCINE?   FDA has authorized the emergency use of the Pfizer-BioNTech COVID-19 Vaccine in individuals 63 years of age and older.   WHO SHOULD NOT GET THE PFIZER-BIONTECH COVID-19 VACCINE?   You should not   get the Pfizer-BioNTech COVID-19 Vaccine if you:   . . had a severe allergic reaction after a previous dose of this vaccine   . . had a severe allergic reaction to any ingredient of this vaccine.     WHAT ARE THE INGREDIENTS IN THE PFIZER-BIONTECH COVID-19 VACCINE?   The Pfizer-BioNTech COVID-19 Vaccine includes the following ingredients: mRNA, lipids ((4-hydroxybutyl)azanediyl)bis(hexane-6,1-diyl)bis(2-hexyldecanoate), 2 [(polyethylene  glycol)-2000]-N,N-ditetradecylacetamide, 1,2-Distearoyl-sn-glycero-3- phosphocholine, and cholesterol), potassium chloride, monobasic potassium phosphate, sodium chloride, dibasic sodium phosphate dihydrate, and sucrose.   HOW IS THE PFIZER-BIONTECH COVID-19 VACCINE GIVEN?   The Pfizer-BioNTech COVID-19 Vaccine will be given to you as an injection into the muscle.   The Pfizer-BioNTech COVID-19 Vaccine vaccination series is 2 doses given 3 weeks apart.   If you receive one dose of the Pfizer-BioNTech COVID-19 Vaccine, you should receive a second dose of this same vaccine 3 weeks later to complete the vaccination series. 3 Revised: 30 September 2019     HAS THE PFIZER-BIONTECH COVID-19 VACCINE BEEN USED BEFORE?   The Pfizer-BioNTech COVID-19 Vaccine is an unapproved vaccine. In clinical trials, approximately 23,000 individuals 63 years of age and older have received at least   1 dose of the Pfizer-BioNTech COVID-19 Vaccine.   WHAT ARE THE BENEFITS OF THE PFIZER-BIONTECH COVID-19 VACCINE?   In an ongoing clinical trial, the Pfizer-BioNTech COVID-19 Vaccine has been shown to prevent COVID-19 following 2 doses given 3 weeks apart. The duration of protection against COVID-19 is currently unknown.   WHAT ARE THE RISKS OF THE PFIZER-BIONTECH COVID-19 VACCINE?   There is a remote chance that the Pfizer-BioNTech COVID-19 Vaccine could cause a severe allergic reaction. A severe allergic reaction would usually occur within a few minutes to one hour after getting a dose of the Pfizer-BioNTech COVID-19 Vaccine. For this reason, your vaccination provider may ask you to stay at the place where you received your vaccine for monitoring after vaccination. Signs of a severe allergic reaction can include:   . . Difficulty breathing   . . Swelling of your face and throat   . . A fast heartbeat   . . A bad rash all over your body   . . Dizziness and weakness     Myocarditis (inflammation of the heart muscle) and pericarditis (inflammation of  the lining outside the heart) have occurred in some people who have received the Pfizer-BioNTech COVID-19 Vaccine. In most of these people, symptoms began within a few days following receipt of the second dose of the Pfizer-BioNTech COVID-19 Vaccine. The chance of having this occur is very low. You should seek medical attention right away if you have any of the following symptoms after receiving the Pfizer-BioNTech COVID-19 Vaccine:   . . Chest pain   . . Shortness of breath   . . Feelings of having a fast-beating, fluttering, or pounding heart.     Side effects that have been reported with the Pfizer-BioNTech COVID-19 Vaccine include:   . . severe allergic reactions   . . non-severe allergic reactions such as rash, itching, hives, or swelling of the face   . . myocarditis (inflammation of the heart muscle)   . . pericarditis (inflammation of the lining outside the heart)   . . injection site pain   . . tiredness   . . headache   . . muscle pain   . . chills   . . joint pain   . . fever   . . injection site swelling   4 Revised: 30 September 2019       . . injection site redness   . . nausea   . . feeling unwell   . . swollen lymph nodes (lymphadenopathy)   . . diarrhea   . . vomiting   . . arm pain     These may not be all the possible side effects of the Pfizer-BioNTech COVID-19 Vaccine. Serious and unexpected side effects may occur. Pfizer-BioNTech COVID-19 Vaccine is still being studied in clinical trials. 5 Revised: 30 September 2019     WHAT SHOULD I DO ABOUT SIDE EFFECTS?   If you experience a severe allergic reaction, call 9-1-1, or go to the nearest hospital.   Call the vaccination provider or your healthcare provider if you have any side effects that bother you or do not go away.   Report vaccine side effects to FDA/CDC Vaccine Adverse Event Reporting System (VAERS). The VAERS toll-free number is 1-800-822-7967 or report online to https://vaers.hhs.gov/reportevent.html. Please include "Pfizer-BioNTech COVID-19  Vaccine EUA" in the first line of box #18 of the report form.   In addition, you can report side effects to [employer] at the contact information provided below. Website  Fax number  Telephone number    www.pfizersafetyreporting.com  1-866-635-8337  [phone]

## 2019-11-15 ENCOUNTER — Other Ambulatory Visit (HOSPITAL_BASED_OUTPATIENT_CLINIC_OR_DEPARTMENT_OTHER): Payer: Self-pay | Admitting: Family Medicine

## 2019-11-15 ENCOUNTER — Ambulatory Visit (HOSPITAL_BASED_OUTPATIENT_CLINIC_OR_DEPARTMENT_OTHER): Payer: Self-pay | Admitting: Family Medicine

## 2019-11-15 ENCOUNTER — Encounter (HOSPITAL_BASED_OUTPATIENT_CLINIC_OR_DEPARTMENT_OTHER): Payer: Self-pay | Admitting: Family Medicine

## 2019-11-15 NOTE — Progress Notes (Signed)
Subjective: The pt is a 64 y.o. year-old female who reports to clinic to discuss pain control. She had been seen in acupuncture clinic but cannot afford these treatments. At that time she was started on gabapentin. Her pain is mostly in her low back and bilateral knees. She is avoiding NSAID'S as she is planning to have a gastric sleeve very soon, she is awaiting approval for this. She did try the gabapentin and this caused GI upset and diarrhea. She then started Cymbalta 20 mg and was increased to 30 mg at her last visit. She has not noticed much of a difference with this. She doses use ice which does help sometimes. She has also tried Voltaren gel which does not help much.          ROS: The patient denies fever, chills, night sweats, and sudden change in weight. No HA or vision changes. No pharyngitis. Pt denies SOB or coughing. No CP or palpitations. No abdominal pain, nausea, vomiting, diarrhea, or constipation. No dysuria. Low back pain, bilateral knee pain.     Meds: acetaminophen (TYLENOL 8 HOUR) 650 mg Oral Tablet Sustained Release, Take 1,300 mg by mouth Twice daily  albuterol sulfate (PROVENTIL OR VENTOLIN OR PROAIR) 90 mcg/actuation Inhalation HFA Aerosol Inhaler, Take 2 Puffs by inhalation Every 6 hours as needed  budesonide-formoterol (SYMBICORT) 160-4.5 mcg/actuation Inhalation HFA Aerosol Inhaler, Take 2 Puffs by inhalation Twice daily  dilTIAZem (CARDIZEM CD) 240 mg Oral Capsule, Sust. Release 24 hr, Take 1 Capsule (240 mg total) by mouth Once a day  DULoxetine (CYMBALTA DR) 30 mg Oral Capsule, Delayed Release(E.C.), Take 1 Capsule (30 mg total) by mouth Once a day  ELIQUIS 5 mg Oral Tablet, TK 1 T PO BID  esomeprazole magnesium (NEXIUM) 20 mg Oral Capsule, Delayed Release(E.C.), Take 20 mg by mouth Once a day  (Patient not taking: Reported on 08/11/2019)  fluticasone propionate (FLONASE) 50 mcg/actuation Nasal Spray, Suspension, 2 Sprays by Each Nostril route Once per day as needed   furosemide  (LASIX) 40 mg Oral Tablet, TAKE 1 TABLET BY MOUTH EVERY DAY (Patient taking differently: Take 60 mg by mouth Once a day Takes 1 1/2 tablet daily)  gabapentin (NEURONTIN) 100 mg Oral Capsule, Take 1 Capsule (100 mg total) by mouth Three times a day (Patient not taking: Reported on 11/14/2019)  hydrALAZINE (APRESOLINE) 25 mg Oral Tablet, Take 1 Tablet (25 mg total) by mouth Three times a day  Ibuprofen (MOTRIN) 600 mg Oral Tablet, Take 600 mg by mouth Once per day as needed for Pain (Patient not taking: Reported on 09/12/2019)  lidocaine (XYLOCAINE) 5 % Ointment, lidocaine Apply 1 application (Topical) 2 times per day for 10 days [id number] ointment 2 times per day Topical 10 days active 5 % (Patient not taking: Reported on 11/14/2019)  losartan (COZAAR) 50 mg Oral Tablet, Take 2 Tabs (100 mg total) by mouth Once a day (Patient taking differently: Take 100 mg by mouth Once a day patient only taking half a dose as BP's have been running low)  melatonin 5 mg Oral Tablet, Chewable, Take 5 mg by mouth Every night   methocarbamoL (ROBAXIN) 500 mg Oral Tablet, Take 1,000 mg by mouth Three times a day (Patient not taking: Reported on 11/14/2019)  metoprolol succinate (TOPROL-XL) 25 mg Oral Tablet Sustained Release 24 hr, Take 1 Tab (25 mg total) by mouth Once a day  predniSONE (DELTASONE) 10 mg Oral Tablet, Take 40 mg by mouth Once a day (Patient not taking: Reported on  11/14/2019)    No facility-administered medications prior to visit.      Allergies:   Allergies   Allergen Reactions   . Chocolate [Cocoa]    . Ciprofloxacin    . Peanut Butter Flavor [Flavoring Agent]      Makes mouth numb   . Prednisone      High doses  Other reaction(s): Hypertension (intolerance), Other (See Comments), Other (See Comments)  Increased BP  Raises her blood pressure     . Protonix [Pantoprazole]    . Sulfa (Sulfonamides)        Objective: BP (!) 114/50 (Site: Left, Patient Position: Sitting, Cuff Size: Thigh)   Pulse 55   Temp 37.1 C (98.7 F) (Oral)    Resp 18   SpO2 96%   Constitutional: NAD, obese  HEENT: TM's clear, non-bulging. PERRL.  No cervical lymphadenopathy.  Respiratory: CTA bilaterally. No wheezes, rhonchi, rales or crackles.  CV: RRR. No Murmurs, gallops, or rubs.  Abd: bowel sounds present in all 4 quadrants. Non-tender.  Extremity: no edema.  Integument: no rash.  Neuro: CN II-XII grossly intact.   Musculoskeletal: Pain with palpation in the low back. Pain in the buttocks bilaterally. Pain with palpation of the medial joint line of the right knee, pain with palpation of the lateral joint line of the left knee.     Assessment and Plan:   1. Joint pain: education was provided regarding the length of time it takes for Cymbalta to start to relieve pain. recommend continuation of ice. She will add topical salonpas to her regimen. Will continue to avoid oral NSAID'S due to upcoming gastric sleeve procedure. Pt was commended for working on weight loss as this will ultimately bring her the most pain relief in her low back and knees.     Yetta Glassman. Alton Revere, MD

## 2019-11-16 ENCOUNTER — Encounter (HOSPITAL_BASED_OUTPATIENT_CLINIC_OR_DEPARTMENT_OTHER): Payer: Self-pay | Admitting: Family Medicine

## 2019-11-16 ENCOUNTER — Other Ambulatory Visit: Payer: Self-pay

## 2019-11-16 ENCOUNTER — Ambulatory Visit: Payer: Commercial Managed Care - PPO | Attending: Family Medicine | Admitting: Family Medicine

## 2019-11-16 VITALS — BP 167/66 | HR 71 | Temp 97.2°F | Ht 64.02 in | Wt 358.9 lb

## 2019-11-16 DIAGNOSIS — Z114 Encounter for screening for human immunodeficiency virus [HIV]: Secondary | ICD-10-CM | POA: Insufficient documentation

## 2019-11-16 DIAGNOSIS — Z6841 Body Mass Index (BMI) 40.0 and over, adult: Secondary | ICD-10-CM

## 2019-11-16 DIAGNOSIS — Z1159 Encounter for screening for other viral diseases: Secondary | ICD-10-CM | POA: Insufficient documentation

## 2019-11-16 DIAGNOSIS — I4819 Other persistent atrial fibrillation: Secondary | ICD-10-CM | POA: Insufficient documentation

## 2019-11-16 DIAGNOSIS — Z1239 Encounter for other screening for malignant neoplasm of breast: Secondary | ICD-10-CM | POA: Insufficient documentation

## 2019-11-16 DIAGNOSIS — I4891 Unspecified atrial fibrillation: Secondary | ICD-10-CM | POA: Insufficient documentation

## 2019-11-16 DIAGNOSIS — Z0000 Encounter for general adult medical examination without abnormal findings: Secondary | ICD-10-CM | POA: Insufficient documentation

## 2019-11-16 DIAGNOSIS — M25562 Pain in left knee: Secondary | ICD-10-CM | POA: Insufficient documentation

## 2019-11-16 DIAGNOSIS — C55 Malignant neoplasm of uterus, part unspecified: Secondary | ICD-10-CM | POA: Insufficient documentation

## 2019-11-16 NOTE — Patient Instructions (Addendum)
Medicare Preventive Services  Medicare coverage information Recommendation for YOU   Heart Disease and Diabetes   Lipid profile Every 5 years or more often if at risk for cardiovascular disease  Last Lipid Panel  (Last result in the past 2 years)      Cholesterol   HDL   LDL   Direct LDL   Triglycerides        02/16/18 0926 194 43 119   160          Diabetes Screening  yearly for those at risk for diabetes, 2 tests per year for those with prediabetes Last Glucose: 123   HEMOGLOBIN A1C   Date Value Ref Range Status   02/16/2018 5.7 4.3 - 6.1 % Final       Diabetes Self Management Training or Medical Nutrition Therapy  For those with diabetes, up to 10 hrs initial training within a year, subsequent years up to 2 hrs of follow up training Optional for those with diabetes     Medical Nutrition Therapy Three hours of one-on-one counseling in first year, two hours in subsequent years Optional for those with diabetes, kidney disease   Intensive Behavioral Therapy for Obesity  Face-to-face counseling, first month every week, month 2-6 every other week, month 7-12 every month if continued progress is documented Optional for those with Body Mass Index 30 or higher  Your Body mass index is 61.58 kg/m.   Tobacco Cessation (Quitting) Counseling   Two attempts per year, max 4 sessions per attempt, up to 8 per year, for those with tobacco-related health condition Optional for those that use tobacco   Cancer Screening   Colorectal screening   For anyone age 61 to 64 or any age if high risk:  . Screening Colonoscopy every 10 yrs if low risk,  more frequent if higher risk  OR  . Flexible  Sigmoidoscopy  every 5 yr OR  . Fecal Occult Blood Testing yearly OR  . Cologuard Stool DNA test once every 3 years OR  . CT Colonography every 5 yrs  Patient completed in 2018. Will request records.    Screening Pap Test Recommended every 3 years for all women age 58 to 4, or every five years if combined with HPV test (routine screening not  needed after total hysterectomy).  Medicare covers every 2 years, up to yearly if high risk.  Screening Pelvic Exam Medicare covers every 2 years, yearly if high risk or childbearing age with abnormal Pap in last 3 yrs. Completed 10/25/2018   Screening Mammogram   Recommended every 1-2 years for women age 56 to 107, and selectively recommended for women between 49-49 based on shared decisions about risk. Covered by Medicare up to every year for women age 78 or older Ordered today   Lung Cancer Screening  Annual low dose computed tomography (LDCT scan) is recommended for those age 39-77 who smoked 30 pack-years and are current smokers or quit smoking within past 15 years (one pack-year= smoking one PPD for one year), after counseling by your doctor or nurse clinician about the possible benefits or harms See your schedule below   Vaccinations   Pneumococcal Vaccine Recommended routinely age 66+ with two separate vaccines one year apart (Prevnar then Pneumovax).  Recommended before age 49 if medical conditions increase risk  Seasonal Influenza Vaccine Once every flu season   Hepatitis B Vaccine 3 doses if risk (including anyone with diabetes or liver disease)  Shingles Vaccine Once or twice at age  36 or older  Diphtheria Tetanus Pertussis Vaccine ONCE as adult, booster every 10 years     Immunization History   Administered Date(s) Administered   . AFLURIA-Influenza Vaccine (6-55mo)(Admin) 02/25/2016, 01/12/2017   . Covid-19 Vaccine,Pfizer-BioNTech 11/14/2019   . Influenza Vaccine, 6 month-adult 02/21/2015, 02/03/2018, 01/07/2019     Shingles vaccine and Diphtheria Tetanus Pertussis vaccines are available at pharmacies or local health department without a prescription.  Recommended: TDaP and Shingrix   Other Screening   Bone Densitometry   Every 24 months for anyone at risk, including postmenopausal    NA   Glaucoma Screening   Yearly if in high risk group such as diabetes, family history, African American age 55+ or  Hispanic American age 77+   Recommended yearly   Hepatitis C Screening recommended ONCE for those born between 1945-1965, or high risk for HCV infection  Ordered today     HIV Testing recommended routinely at least ONCE, covered every year for age 75 to 50 regardless of risk, and every year for age over 86 who ask for the test or higher risk  Yearly or up to 3 times in pregnancy      Ordered today   Abdominal Aortic Aneurysm Screening Ultrasound   Once between the age of 21-75 with a family history of AAA       Your Personalized Schedule for Preventive Tests   Health Maintenance: Pending and Last Completed       Date Due Completion Date    Hepatitis C screening Never done ---    HIV Screening Never done ---    Adult Tdap-Td (1 - Tdap) Never done ---    Colonoscopy Never done ---    Mammography Never done ---    Shingles Vaccine (1 of 2) Never done ---    Covid-19 Vaccine (2 of 2 - Pfizer series) 12/05/2019 11/14/2019    Influenza Vaccine (1) 12/07/2019 01/07/2019    Depression Screening 11/15/2020 11/16/2019    Annual Wellness Visit 11/15/2020 11/16/2019           Will see you next year for Annual Wellness Visit!  It was nice meeting you!  Alver Sorrow, RN

## 2019-11-16 NOTE — Procedures (Signed)
[facility], [place] [id number]  Operated by [facility]  Procedure Note    Name: Alexandra Nunez MRN:  [id number]   Date: 11/16/2019 Age: 64 y.o.           Joint Asp / Inj    Date/Time: 11/16/2019 1:46 PM  Performed by: Hartley Barefoot, MD  Authorized by: Hartley Barefoot, MD     Consent:     Consent obtained:  Verbal    Consent given by:  Patient  Location:     Location:  Knee  Anesthesia (see MAR for exact dosages):     Anesthesia method:  None  Procedure details:     Ultrasound guidance: no      Approach:  Inferior    Steroid injected: yes      Specimen collected: no    Post-procedure details:     Dressing:  Adhesive bandage

## 2019-11-16 NOTE — Progress Notes (Signed)
FAMILY MEDICINE, [place] [address]  Operated by [facility]  Medicare Annual Wellness Visit    Name: Shatisha Falter MRN:  [id number]   Date: 11/16/2019 Age: 64 y.o.       SUBJECTIVE:   Jannetta Massey is a 64 y.o. female for presenting for Medicare Wellness exam.   I have reviewed and reconciled the medication list with the patient today.    New issues: bilateral knee pain and her weight.     Comprehensive Health Assessment:  Patient verbal responses recorded in flowsheet   [place] 11/16/2019   Do you wish to complete this form? Yes   During the past 4 weeks, how would you rate your health in general? Fair   During the past 4 weeks, how much difficulty have you had doing your usual activities inside and outside your home because of medical or emotional problems? Some difficulty   During the past 4 weeks, was someone available to help you if you needed and wanted help? Yes, as much as I wanted   In the past year, how many times have you gone to the emergency department or been admitted to a hospital for a health problem? 2-4 times   Have you fallen 2 or more times in the last year? No   During the past 4 weeks, how much bodily pain have you typically had? Severe pain   Do you have enough money to buy things you need in everyday life, such as food, clothing, medicines, and housing? Yes, always   Can you get to places beyond walking distance without help?  (For example, can you drive your own car or travel alone on buses)? No   Do you fasten your seatbelt when you are in a car? Yes, usually   Are you generally satisfied with your sleep? No   During the past 12 months, have you experienced confusion or memory loss that is happening more often or is getting worse? Yes   Do you smoke, or use tobacco? No   If you do not smoke, how often are you in the room with someone who is smoking? Often   Do you exercise 20 minutes 3 or more  days per week (such as walking, dancing, biking, mowing grass, swimming)? No, I usually don't exercise this much   How often do you eat food that is healthy (fruits, vegetables, lean meats) instead of unhealthy (sweets, fast food, junk food, fatty foods)? Most of the time   During the past 4 weeks, how many drinks of wine, beer, or other alcohol-containing beverages did you have? No alcohol at all   How often do you have trouble taking medicines the eay you are told to take them? I always take them as prescribed   Do you currently use opioid pain medications, take regularly or keep on hand for use as needed (medication such as hydrocodone, oxycodone, codeine or morphine)? No   How many times in the past year have you used an illegal drug or used prescription medication for nonmedical reasons? None   Have your parents, brothers or sisters had any of the following problems before the age of 3 (check all that apply)? Other family illness   How confident are you that you can control or manage most of your health problems? Very confident   Do you need any help communicating with your doctors and nurses because of vision or hearing problems? No  Do you have one person you think of as your personal doctor (primary care provider or family doctor)? Yes   Are you now also seeing any specialist physician(s) (such as eye doctor, foot doctor, skin doctor)? Yes       I have reviewed and updated as appropriate the past medical, family and social history. 11/16/2019 as summarized below:  Past Medical History:   Diagnosis Date    Asthma     Atrial fibrillation (CMS HCC)     Borderline diabetes     Chronic joint pain     CKD (chronic kidney disease) stage 3, GFR 30-59 ml/min     Essential hypertension     GERD (gastroesophageal reflux disease)     Knee pain, bilateral     Obesity     Osteoarthritis     Sleep apnea     Uterine cancer (CMS [facility])      Past Surgical History:   Procedure Laterality Date    Cardioversion      Colonoscopy       Hx appendectomy      Hx dilation and curettage      Hx hysterectomy      Laparoscopic total hysterectomy      Sinus surgery  2004     Current Outpatient Medications   Medication Sig    acetaminophen (TYLENOL 8 HOUR) 650 mg Oral Tablet Sustained Release Take 1,300 mg by mouth Twice daily    albuterol sulfate (PROVENTIL OR VENTOLIN OR PROAIR) 90 mcg/actuation Inhalation HFA Aerosol Inhaler Take 2 Puffs by inhalation Every 6 hours as needed (Patient not taking: Reported on 11/16/2019)    budesonide-formoterol (SYMBICORT) 160-4.5 mcg/actuation Inhalation HFA Aerosol Inhaler Take 2 Puffs by inhalation Twice daily    dilTIAZem (CARDIZEM CD) 240 mg Oral Capsule, Sust. Release 24 hr Take 1 Capsule (240 mg total) by mouth Once a day    DULoxetine (CYMBALTA DR) 30 mg Oral Capsule, Delayed Release(E.C.) Take 1 Capsule (30 mg total) by mouth Once a day    ELIQUIS 5 mg Oral Tablet TK 1 T PO BID    esomeprazole magnesium (NEXIUM) 20 mg Oral Capsule, Delayed Release(E.C.) Take 20 mg by mouth Once a day  (Patient not taking: Reported on 08/11/2019)    fluticasone propionate (FLONASE) 50 mcg/actuation Nasal Spray, Suspension 2 Sprays by Each Nostril route Once per day as needed  (Patient not taking: Reported on 11/16/2019)    furosemide (LASIX) 40 mg Oral Tablet TAKE 1 TABLET BY MOUTH EVERY DAY (Patient taking differently: Take 60 mg by mouth Once a day Takes 1 1/2 tablet daily)    gabapentin (NEURONTIN) 100 mg Oral Capsule Take 1 Capsule (100 mg total) by mouth Three times a day (Patient not taking: Reported on 11/14/2019)    hydrALAZINE (APRESOLINE) 25 mg Oral Tablet Take 1 Tablet (25 mg total) by mouth Three times a day    Ibuprofen (MOTRIN) 600 mg Oral Tablet Take 600 mg by mouth Once per day as needed for Pain (Patient not taking: Reported on 09/12/2019)    lidocaine (XYLOCAINE) 5 % Ointment lidocaine Apply 1 application (Topical) 2 times per day for 10 days [id number] ointment 2 times per day Topical 10 days active 5 % (Patient not  taking: Reported on 11/14/2019)    losartan (COZAAR) 50 mg Oral Tablet Take 2 Tabs (100 mg total) by mouth Once a day (Patient taking differently: Take 100 mg by mouth Once a day patient only taking half a dose as BP's  have been running low)    melatonin 5 mg Oral Tablet, Chewable Take 5 mg by mouth Every night     methocarbamoL (ROBAXIN) 500 mg Oral Tablet Take 1,000 mg by mouth Three times a day (Patient not taking: Reported on 11/14/2019)    metoprolol succinate (TOPROL-XL) 25 mg Oral Tablet Sustained Release 24 hr Take 1 Tab (25 mg total) by mouth Once a day    predniSONE (DELTASONE) 10 mg Oral Tablet Take 40 mg by mouth Once a day (Patient not taking: Reported on 11/14/2019)     Family Medical History:       Problem Relation (Age of Onset)    Coronary Artery Disease Mother, Father    Diabetes type II Father    HTN <20 y.o. Mother, Father              Social History     Socioeconomic History    Marital status: Married     Spouse name: Not on file    Number of children: Not on file    Years of education: Not on file    Highest education level: Not on file   Tobacco Use    Smoking status: Never Smoker    Smokeless tobacco: Never Used   Vaping Use    Vaping Use: Never used   Substance and Sexual Activity    Alcohol use: Never    Drug use: Never   Other Topics Concern     Social Determinants of Health     Financial Resource Strain:     Difficulty of Paying Living Expenses:    Food Insecurity:     Worried About Charity fundraiser in the Last Year:     Arboriculturist in the Last Year:    Transportation Needs:     Film/video editor (Medical):     Lack of Transportation (Non-Medical):    Physical Activity:     Days of Exercise per Week:     Minutes of Exercise per Session:    Stress:     Feeling of Stress :    Intimate Partner Violence:     Fear of Current or Ex-Partner:     Emotionally Abused:     Physically Abused:     Sexually Abused:      Review of Systems:   Constitutional: negative for fevers, chills and weight  loss. Reports weight gain  Eyes: negative for visual disturbance  Ears, nose, mouth, throat, and face: negative for earaches, nasal congestion, and hoarseness. Reports mild hearing loss.   Respiratory: negative for wheezing, cough or dyspnea on exertion  Cardiovascular: negative for chest pressure/discomfort and palpitations  Gastrointestinal: negative for dysphagia, nausea, vomiting, diarrhea, abdominal pain and blood in bowel movements. Reports intermittent constipation.   Genitourinary:negative for dysuria and hematuria. Reports urinary frequency.   Integument/breast: negative for itching, rash and changed mole  Hematologic/lymphatic: negative for lymphadenopathy  Musculoskeletal: Reports bilateral knee pain and low back pain radiating down right leg.   Neurological: negative for seizures and stroke or TIA symptoms  Behavioral/Psych:  PHQ2 done- See nursing questionairre  Endocrine: negative for diabetic symptoms including polyuria, polydipsia and weight loss  Allergic/Immunologic: negative      List of Current Health Care Providers   Care Team       PCP       Name Type Specialty Phone Number    Hartley Barefoot, MD Physician FAMILY MEDICINE [phone]  Care Team       Name Type Specialty Phone Number    Leo Grosser, MD Physician INTERNAL MEDICINE CARDIOVASCULAR DISEASE [phone]    Cynda Acres, MD Physician GYNECOLOGICAL/ONCOLOGY [phone]    Tanda Rockers, MD Not available [facility] PULMONARY [phone]    Kristin Bruins, MD Physician GENERAL SURGERY [phone]                      Health Maintenance   Topic Date Due    Hepatitis C screening  Never done    HIV Screening  Never done    Adult Tdap-Td (1 - Tdap) Never done    Colonoscopy  Never done    Mammography  Never done    Shingles Vaccine (1 of 2) Never done    Covid-19 Vaccine (2 of 2 - Pfizer series) 12/05/2019    Influenza Vaccine (1) 12/07/2019    Depression Screening  11/15/2020    Annual Wellness Visit  11/15/2020     Medicare  Wellness Assessment   Medicare initial or wellness physical in the last year?: No  Advance Directives (optional)   Does patient have a living will or MPOA: no   Has patient provided [employer] with a copy?: no   Advance directive information given to the patient today?: no      Activities of Daily Living   Do you need help with dressing, bathing, or walking?: Yes   Do you need help with shopping, housekeeping, medications, or finances?: Yes   Do you have rugs in hallways, broken steps, or poor lighting?: Yes   Do you have grab bars in your bathroom, non-slip strips in your tub, and hand rails on your stairs?: Yes   Urinary Incontinence Screen (Women >=65 only)       Cognitive Function Screen (1=Yes, 0=No)   What is you age?: Correct   What is the time to the nearest hour?: Correct   What is the year?: Correct   What is the name of this clinic?: Correct   Can the patient recognize two persons (the doctor, the nurse, home help, etc.)?: Correct   What is the date of your birth? (day and month sufficient) : Correct   In what year did World War II end?: Incorrect   Who is the current president of the [place]?: Correct   Count from 20 down to 1?: Correct   What address did I give you earlier?: Correct   Total Score: 9   Interpretation of Total Score: Greater than 6 Normal   Hearing Screen   Have you noticed any hearing difficulties?: Yes  After whispering 9-1-6 how many numbers did the patient repeat correctly?: 3   Fall Risk Screen   Do you feel unsteady when standing or walking?: Yes  Do you worry about falling?: Yes  Have you fallen in the past year?: No   Vision Screen           Depression Screen     Little interest or pleasure in doing things.: More than half the days  Feeling down, depressed, or hopeless: Not at all  PHQ 2 Total: 2        OBJECTIVE:   BP (!) 167/66   Pulse 71   Temp 36.2 C (97.2 F) (Thermal Scan)   Ht 1.626 m (5' 4.02")   Wt (!) 163 kg (358 lb 14.5 oz)   SpO2 93%   BMI 61.58 kg/m  Other appropriate exam:    Health Maintenance Due   Topic Date Due    Hepatitis C screening  Never done    HIV Screening  Never done    Adult Tdap-Td (1 - Tdap) Never done    Colonoscopy  Never done    Mammography  Never done    Shingles Vaccine (1 of 2) Never done      ASSESSMENT & PLAN:   1. Medicare annual wellness visit, initial    2. Persistent atrial fibrillation (CMS HCC)    3. Atrial fibrillation with rapid ventricular response (CMS HCC)    4. Morbid obesity (CMS [place])    5. Malignant neoplasm of uterus, unspecified site (CMS [place])    6. Encounter for screening for malignant neoplasm of breast, unspecified screening modality    7. Encounter for hepatitis C screening test for low risk patient    8. Screening for HIV (human immunodeficiency virus)       Identified Risk Factors/ Recommended Actions   (Z00.00) Medicare annual wellness visit, initial  (primary encounter diagnosis)  Plan: see below    (I48.19) Persistent atrial fibrillation (CMS HCC)  (I48.91) Atrial fibrillation with rapid ventricular response (CMS HCC)  Plan: follows with Cardiology    (E66.01) Morbid obesity (CMS [place])  Plan: follows with Dr. Caleb Popp for upcoming bariatric surgery    (C55) Malignant neoplasm of uterus, unspecified site (CMS [facility])  Plan: follows with GYN Oncology.     (Z12.39) Encounter for screening for malignant neoplasm of breast, unspecified screening modality  Plan: MAMMO BILATERAL SCREENING-ADDL VIEWS/BREAST US         AS REQ BY RAD        Ordered today    (Z11.59) Encounter for hepatitis C screening test for low risk patient  Plan: HEPATITIS C ANTIBODY SCREEN WITH REFLEX TO HCV         PCR        Ordered today    (Z11.4) Screening for HIV (human immunodeficiency virus)  Plan: HIV1/HIV2 SCREEN, COMBINED ANTIGEN AND ANTIBODY        Ordered today      Fall Risk Follow up plan of care: Discussed optimizing home safety  The PHQ 2 Total: 2 depression screen is interpreted as negative.    No orders of the defined types were  placed in this encounter.         The patient has been educated about risk factors and recommended preventive care. Written Prevention Plan completed/ updated and given to patient (see After Visit Summary).    No follow-ups on file.    A shared visit was performed with the co-signing physician.     [place]  [place]  [place] [address]  Phone: [phone]  Fax: [phone]   Alver Sorrow, RN  11/16/2019, 11:00     I personally met face-to-face with the patient today. See nurse note for additional details. I reviewed and discussed the prevention plan with the patient.     Yetta Glassman. Alton Revere, MD

## 2019-11-24 ENCOUNTER — Encounter (HOSPITAL_BASED_OUTPATIENT_CLINIC_OR_DEPARTMENT_OTHER): Payer: Self-pay

## 2019-12-05 ENCOUNTER — Ambulatory Visit (HOSPITAL_BASED_OUTPATIENT_CLINIC_OR_DEPARTMENT_OTHER): Payer: Self-pay

## 2019-12-09 ENCOUNTER — Other Ambulatory Visit (HOSPITAL_BASED_OUTPATIENT_CLINIC_OR_DEPARTMENT_OTHER): Payer: Self-pay

## 2019-12-15 ENCOUNTER — Other Ambulatory Visit (HOSPITAL_BASED_OUTPATIENT_CLINIC_OR_DEPARTMENT_OTHER): Payer: Self-pay | Admitting: Family Medicine

## 2019-12-16 ENCOUNTER — Other Ambulatory Visit (HOSPITAL_BASED_OUTPATIENT_CLINIC_OR_DEPARTMENT_OTHER): Payer: Self-pay | Admitting: Family Medicine

## 2019-12-19 ENCOUNTER — Ambulatory Visit (HOSPITAL_BASED_OUTPATIENT_CLINIC_OR_DEPARTMENT_OTHER): Payer: Commercial Managed Care - PPO

## 2019-12-19 ENCOUNTER — Other Ambulatory Visit: Payer: Self-pay

## 2019-12-19 ENCOUNTER — Ambulatory Visit: Payer: Commercial Managed Care - PPO | Attending: Family Medicine | Admitting: Family Medicine

## 2019-12-19 ENCOUNTER — Encounter (HOSPITAL_BASED_OUTPATIENT_CLINIC_OR_DEPARTMENT_OTHER): Payer: Self-pay | Admitting: Family Medicine

## 2019-12-19 VITALS — BP 144/78 | HR 69 | Temp 96.7°F | Resp 20 | Ht 64.02 in | Wt 355.6 lb

## 2019-12-19 DIAGNOSIS — M25561 Pain in right knee: Secondary | ICD-10-CM

## 2019-12-19 DIAGNOSIS — Z23 Encounter for immunization: Secondary | ICD-10-CM

## 2019-12-19 DIAGNOSIS — I10 Essential (primary) hypertension: Secondary | ICD-10-CM | POA: Insufficient documentation

## 2019-12-19 DIAGNOSIS — M25562 Pain in left knee: Secondary | ICD-10-CM

## 2019-12-19 DIAGNOSIS — M792 Neuralgia and neuritis, unspecified: Secondary | ICD-10-CM

## 2019-12-19 DIAGNOSIS — M25569 Pain in unspecified knee: Secondary | ICD-10-CM | POA: Insufficient documentation

## 2019-12-19 MED ORDER — TRAMADOL 50 MG TABLET
1.0000 | ORAL_TABLET | Freq: Four times a day (QID) | ORAL | 0 refills | Status: DC | PRN
Start: 2019-12-19 — End: 2020-01-02

## 2019-12-19 MED ORDER — DULOXETINE 30 MG CAPSULE,DELAYED RELEASE
60.0000 mg | DELAYED_RELEASE_CAPSULE | Freq: Every day | ORAL | 11 refills | Status: DC
Start: 2019-12-19 — End: 2019-12-19

## 2019-12-19 MED ORDER — DILTIAZEM CD 300 MG CAPSULE,EXTENDED RELEASE 24 HR
300.0000 mg | ORAL_CAPSULE | Freq: Every day | ORAL | 4 refills | Status: DC
Start: 2019-12-19 — End: 2020-04-16

## 2019-12-19 MED ORDER — DULOXETINE 60 MG CAPSULE,DELAYED RELEASE
60.0000 mg | DELAYED_RELEASE_CAPSULE | Freq: Every day | ORAL | 4 refills | Status: DC
Start: 2019-12-19 — End: 2020-04-16

## 2019-12-19 NOTE — Patient Instructions (Signed)
1 Revised: 28 November 2019      VACCINE INFORMATION FACT SHEET FOR RECIPIENTS AND CAREGIVERS ABOUT COMIRNATY (COVID-19 VACCINE, mRNA)   AND PFIZER-BIONTECH COVID-19 VACCINE TO PREVENT CORONAVIRUS DISEASE 2019 (COVID-19)   You are being offered either COMIRNATY (COVID-19 Vaccine, mRNA) or the Pfizer-BioNTech COVID-19 Vaccine to prevent Coronavirus Disease 2019 (COVID-19) caused by SARS-CoV-2.   This Vaccine Information Fact Sheet for Recipients and Caregivers comprises the Fact Sheet for the authorized Pfizer-BioNTech COVID-19 Vaccine and also includes information about the FDA-licensed vaccine, COMIRNATY (COVID-19 Vaccine, mRNA).   The FDA-approved COMIRNATY (COVID-19 Vaccine, mRNA) and the FDA-authorized Pfizer-BioNTech COVID-19 Vaccine under Emergency Use Authorization (EUA) have the same formulation and can be used interchangeably to provide the COVID-19 vaccination series.[1]   [1] The licensed vaccine has the same formulation as the EUA-authorized vaccine and the products can be used interchangeably to provide the vaccination series without presenting any safety or effectiveness concerns. The products are legally distinct with certain differences that do not impact safety or effectiveness.   COMIRNATY (COVID-19 Vaccine, mRNA) is an FDA-approved COVID-19 vaccine made by [employer] for [employer].   . It is approved as a 2-dose series for prevention of COVID-19 in individuals 16 years of age and older.   . It is also authorized under EUA to be administered to: o prevent COVID-19 in individuals 12 through 15 years, and   o provide a third dose to individuals 12 years of age and older who have been determined to have certain kinds of immunocompromise.       The Pfizer-BioNTech COVID-19 Vaccine has received EUA from FDA to:   . prevent COVID-19 in individuals 12 years of age and older, and   . provide a third dose to individuals 12 years of age and older who have been determined to have certain kinds of  immunocompromise.     This Vaccine Information Fact Sheet contains information to help you understand the risks and benefits of COMIRNATY (COVID-19 Vaccine, mRNA) and the Pfizer-BioNTech COVID-19 Vaccine, which you may receive because there is currently a pandemic of COVID-19. Talk to your vaccination provider if you have questions.   COMIRNATY (COVID-19 Vaccine, mRNA) and the Pfizer-BioNTech COVID-19 Vaccine are administered as a 2-dose series, 3 weeks apart, into the muscle.   2 Revised: 28 November 2019   Under EUA for individuals who are determined to have certain kinds of immunocompromise, a third dose may be administered at least 4 weeks after the second dose.   COMIRNATY (COVID-19 Vaccine, mRNA) and the Pfizer-BioNTech COVID-19 Vaccine may not protect everyone.   This Fact Sheet may have been updated. For the most recent Fact Sheet, please see www.cvdvaccine.com.   WHAT YOU NEED TO KNOW BEFORE YOU GET THIS VACCINE   WHAT IS COVID-19?   COVID-19 disease is caused by a coronavirus called SARS-CoV-2. You can get COVID-19 through contact with another person who has the virus. It is predominantly a respiratory illness that can affect other organs. People with COVID-19 have had a wide range of symptoms reported, ranging from mild symptoms to severe illness leading to death. Symptoms may appear 2 to 14 days after exposure to the virus. Symptoms may include: fever or chills; cough; shortness of breath; fatigue; muscle or body aches; headache; new loss of taste or smell; sore throat; congestion or runny nose; nausea or vomiting; diarrhea.   WHAT IS COMIRNATY (COVID-19 VACCINE, mRNA) AND HOW IS IT RELATED TO THE PFIZER-BIONTECH COVID-19 VACCINE?   COMIRNATY (COVID-19 Vaccine, mRNA)   and the Pfizer-BioNTech COVID-19 Vaccine have the same formulation and can be used interchangeably to provide the COVID-19 vaccination series.1   1 The licensed vaccine has the same formulation as the EUA-authorized vaccine and the products  can be used interchangeably to provide the vaccination series without presenting any safety or effectiveness concerns. The products are legally distinct with certain differences that do not impact safety or effectiveness.   For more information on EUA, see the "What is an Emergency Use Authorization (EUA)?" section at the end of this Fact Sheet.   3 Revised: 28 November 2019   WHAT SHOULD YOU MENTION TO YOUR VACCINATION PROVIDER BEFORE YOU GET THE VACCINE?   Tell the vaccination provider about all of your medical conditions, including if you:   . have any allergies   . have had myocarditis (inflammation of the heart muscle) or pericarditis (inflammation of the lining outside the heart)   . have a fever   . have a bleeding disorder or are on a blood thinner   . are immunocompromised or are on a medicine that affects your immune system   . are pregnant or plan to become pregnant   . are breastfeeding   . have received another COVID-19 vaccine   . have ever fainted in association with an injection     WHO SHOULD GET THE VACCINE?   FDA has approved COMIRNATY (COVID-19 Vaccine, mRNA) for use in individuals 16 years of age and older and has authorized it for emergency use in individuals 12 through 15 years.   FDA has authorized the emergency use of the Pfizer-BioNTech COVID-19 Vaccine in individuals 12 years of age and older.   WHO SHOULD NOT GET THE VACCINE?   You should not get the COMIRNATY (COVID-19 Vaccine, mRNA) or the Pfizer-BioNTech COVID-19 Vaccine if you:   . had a severe allergic reaction after a previous dose of this vaccine   . had a severe allergic reaction to any ingredient of this vaccine.     WHAT ARE THE INGREDIENTS IN COMIRNATY (COVID-19 VACCINE, mRNA) AND THE PFIZER-BIONTECH COVID-19 VACCINE?   COMIRNATY (COVID-19 Vaccine, mRNA) and the Pfizer-BioNTech COVID-19 Vaccine include the following ingredients: mRNA, lipids ((4-hydroxybutyl)azanediyl)bis(hexane-6,1-diyl)bis(2-hexyldecanoate), 2 [(polyethylene  glycol)-2000]-N,N-ditetradecylacetamide, 1,2-Distearoyl-sn-glycero-3-phosphocholine, and cholesterol), potassium chloride, monobasic potassium phosphate, sodium chloride, dibasic sodium phosphate dihydrate, and sucrose.   HOW IS THE VACCINE GIVEN?   COMIRNATY (COVID-19 Vaccine, mRNA) and the Pfizer-BioNTech COVID-19 Vaccine will be given to you as an injection into the muscle.   The vaccination series is 2 doses given 3 weeks apart.   If you receive one dose of the vaccine, you should receive a second dose of the vaccine 3 weeks later to complete the vaccination series.   4 Revised: 28 November 2019   HAVE COMIRNATY (COVID-19 VACCINE, mRNA) AND THE PFIZER-BIONTECH COVID-19 VACCINE BEEN USED BEFORE?   In clinical trials, approximately 23,000 individuals 12 years of age and older have received at least 1 dose of the Pfizer-BioNTech COVID-19 Vaccine. Data from these clinical trials supported the Emergency Use Authorization of the Pfizer-BioNTech COVID-19 Vaccine and the approval of COMIRNATY (COVID-19 Vaccine, mRNA). Millions of individuals have received the Pfizer-BioNTech COVID-19 Vaccine under EUA since March 18, 2019.   WHAT ARE THE BENEFITS OF COMIRNATY (COVID-19 VACCINE, mRNA) AND THE PFIZER-BIONTECH COVID-19 VACCINE?   The vaccine has been shown to prevent COVID-19 following 2 doses given 3 weeks apart. The duration of protection against COVID-19 is currently unknown.   WHAT ARE THE RISKS OF COMIRNATY (  COVID-19 VACCINE, mRNA) AND THE PFIZER-BIONTECH COVID-19 VACCINE?   There is a remote chance that the vaccine could cause a severe allergic reaction. A severe allergic reaction would usually occur within a few minutes to one hour after getting a dose of the vaccine. For this reason, your vaccination provider may ask you to stay at the place where you received your vaccine for monitoring after vaccination. Signs of a severe allergic reaction can include:   . Difficulty breathing   . Swelling of your face and  throat   . A fast heartbeat   . A bad rash all over your body   . Dizziness and weakness     Myocarditis (inflammation of the heart muscle) and pericarditis (inflammation of the lining outside the heart) have occurred in some people who have received COMIRNATY (COVID-19 Vaccine, mRNA) or the Pfizer-BioNTech COVID-19 Vaccine. In most of these people, symptoms began within a few days following receipt of the second dose of vaccine. The chance of having this occur is very low. You should seek medical attention right away if you have any of the following symptoms after receiving the vaccine:   . Chest pain   . Shortness of breath   . Feelings of having a fast-beating, fluttering, or pounding heart     Side effects that have been reported with COMIRNATY (COVID-19 Vaccine, mRNA) or the Pfizer-BioNTech COVID-19 Vaccine include:   . severe allergic reactions   . non-severe allergic reactions such as rash, itching, hives, or swelling of the face   . myocarditis (inflammation of the heart muscle)   . pericarditis (inflammation of the lining outside the heart)   . injection site pain   . tiredness   . headache     5 Revised: 28 November 2019   . muscle pain   . chills   . joint pain   . fever   . injection site swelling   . injection site redness   . nausea   . feeling unwell   . swollen lymph nodes (lymphadenopathy)   . diarrhea   . vomiting   . arm pain     These may not be all the possible side effects of the vaccine. Serious and unexpected side effects may occur. The possible side effects of the vaccine are still being studied in clinical trials.   WHAT SHOULD I DO ABOUT SIDE EFFECTS?   If you experience a severe allergic reaction, call 9-1-1, or go to the nearest hospital.   Call the vaccination provider or your healthcare provider if you have any side effects that bother you or do not go away.   Report vaccine side effects to FDA/CDC Vaccine Adverse Event Reporting System (VAERS). The VAERS toll-free number is  1-800-822-7967 or report online to https://vaers.hhs.gov/reportevent.html. Please include either "COMIRNATY (COVID-19 Vaccine, mRNA)" or "Pfizer-BioNTech COVID-19 Vaccine EUA", as appropriate, in the first line of box #18 of the report form.   In addition, you can report side effects to [employer] at the contact information provided below. Website  Fax number  Telephone number    www.pfizersafetyreporting.com  1-866-635-8337  [phone]

## 2019-12-19 NOTE — Progress Notes (Signed)
Subjective: The pt is a 64 y.o. year-old female who reports to clinic for follow up. She struggles with knee pian. She did get an injection in her left knee and felt some better for a few days but then the left knee pain got worse. She does not wish to have the right knee injected. She is using salon pas which does not give her much relief. She has bee having alot of f pain in the later left knee :feels like my knee cap freezes in place". He has been using both heat and ice. Heat does better than ice. 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 She is awaiting bariatric surgery which she is hopeful that she will be able to have this surgery soon.  She has been out of the duloxetine for 3 weeks and she is noticing nerve pain again. She would like to restart this.         ROS: The patient denies fever, chills, night sweats, and sudden change in weight. No HA or vision changes. No pharyngitis. Occasional sinus problems. Pt denies SOB or coughing. No CP or palpitations. No abdominal pain, nausea, vomiting, diarrhea. some constipation. No dysuria. No focal weakness. she has pain in both knees,some stiffness. she has had return of some nerve pain.     Meds: acetaminophen (TYLENOL 8 HOUR) 650 mg Oral Tablet Sustained Release, Take 1,300 mg by mouth Twice daily  albuterol sulfate (PROVENTIL OR VENTOLIN OR PROAIR) 90 mcg/actuation Inhalation HFA Aerosol Inhaler, Take 2 Puffs by inhalation Every 6 hours as needed    budesonide-formoterol (SYMBICORT) 160-4.5 mcg/actuation Inhalation HFA Aerosol Inhaler, Take 2 Puffs by inhalation Twice daily  dilTIAZem (CARDIZEM CD) 240 mg Oral Capsule, Sust. Release 24 hr, Take 1 Capsule (240 mg total) by mouth Once a day  DULoxetine (CYMBALTA DR) 30 mg Oral Capsule, Delayed Release(E.C.), Take 1 Capsule (30 mg total) by mouth Once a day (Patient not taking: Reported on 12/19/2019 )  ELIQUIS 5 mg Oral Tablet, TK 1 T PO BID  esomeprazole magnesium (NEXIUM) 20 mg Oral Capsule,  Delayed Release(E.C.), Take 20 mg by mouth Once a day  (Patient not taking: Reported on 08/11/2019)  fluticasone propionate (FLONASE) 50 mcg/actuation Nasal Spray, Suspension, 2 Sprays by Each Nostril route Once per day as needed  (Patient not taking: Reported on 11/16/2019 )  furosemide (LASIX) 40 mg Oral Tablet, TAKE 1 TABLET BY MOUTH EVERY DAY (Patient taking differently: Take 60 mg by mouth Once a day Takes 1 1/2 tablet daily)  gabapentin (NEURONTIN) 100 mg Oral Capsule, Take 1 Capsule (100 mg total) by mouth Three times a day (Patient not taking: Reported on 11/14/2019)  hydrALAZINE (APRESOLINE) 25 mg Oral Tablet, Take 1 Tablet (25 mg total) by mouth Three times a day  Ibuprofen (MOTRIN) 600 mg Oral Tablet, Take 600 mg by mouth Once per day as needed for Pain (Patient not taking: Reported on 09/12/2019)  lidocaine (XYLOCAINE) 5 % Ointment, lidocaine Apply 1 application (Topical) 2 times per day for 10 days [id number] ointment 2 times per day Topical 10 days active 5 % (Patient not taking: Reported on 11/14/2019)  losartan (COZAAR) 50 mg Oral Tablet, Take 2 Tabs (100 mg total) by mouth Once a day (Patient taking differently: Take 100 mg by mouth Once a day patient only taking half a dose as BP's have been running low)  melatonin 5 mg Oral Tablet, Chewable, Take 5 mg by mouth Every night   methocarbamoL (ROBAXIN) 500 mg  Oral Tablet, Take 1,000 mg by mouth Three times a day (Patient not taking: Reported on 11/14/2019)  metoprolol succinate (TOPROL-XL) 25 mg Oral Tablet Sustained Release 24 hr, Take 1 Tab (25 mg total) by mouth Once a day  predniSONE (DELTASONE) 10 mg Oral Tablet, Take 40 mg by mouth Once a day (Patient not taking: Reported on 11/14/2019)    No facility-administered medications prior to visit.      Allergies:   Allergies   Allergen Reactions   . Chocolate [Cocoa]    . Ciprofloxacin    . Peanut Butter Flavor [Flavoring Agent]      Makes mouth numb   . Prednisone      High doses  Other reaction(s): Hypertension  (intolerance), Other (See Comments), Other (See Comments)  Increased BP  Raises her blood pressure     . Protonix [Pantoprazole]    . Sulfa (Sulfonamides)        Objective: BP (!) 144/78 (Cuff Size: Adult)   Pulse 69   Temp 35.9 C (96.7 F) (Thermal Scan)   Resp 20   Ht 1.626 m (5' 4.02")   Wt (!) 161 kg (355 lb 9.6 oz)   SpO2 97%   BMI 61.01 kg/m   Constitutional: NAD  HEENT: TM's clear, non-bulging. PERRL. Marland Kitchen No cervical lymphadenopathy.  Respiratory: CTA bilaterally. No wheezes, rhonchi, rales or crackles.  CV: RRR. No Murmurs, gallops, or rubs.  Abd: bowel sounds present in all 4 quadrants. Non-tender.  Extremity: trace edema.  Integument: no rash.  Neuro: CN II-XII grossly intact.   Musculoskeletal: pt is in a wheelchair due to difficulty walking for distances. Some soreness with palpation of both joint lines.      Assessment and Plan:   1.Bilateral knee pain: she has done PT, topicals, oral medication and her pain is still significant. Will begin ultram for 1 week trial and follow.   2.Nerve pain: will restart Cymbalta.   3.Obesity: she is perusing bariatric surgery.   4.HTN: increase Cardizem to 300 mg daily. Will continue cozaar and metoprolol   5.HCM: COVID second shot given today.     Yetta Glassman. Alton Revere, MD

## 2019-12-21 ENCOUNTER — Other Ambulatory Visit (HOSPITAL_BASED_OUTPATIENT_CLINIC_OR_DEPARTMENT_OTHER): Payer: Self-pay

## 2019-12-23 ENCOUNTER — Encounter (HOSPITAL_BASED_OUTPATIENT_CLINIC_OR_DEPARTMENT_OTHER): Payer: Commercial Managed Care - PPO | Admitting: Family Medicine

## 2019-12-30 ENCOUNTER — Ambulatory Visit (HOSPITAL_BASED_OUTPATIENT_CLINIC_OR_DEPARTMENT_OTHER): Payer: Self-pay | Admitting: Family Medicine

## 2019-12-30 NOTE — Telephone Encounter (Signed)
Regarding: [facility]/ med update  ----- Message from Jules Husbands sent at 12/30/2019 11:48 AM EDT -----  Hartley Barefoot, MD    Pt wanting to update PCP that the tramadol is helping with pain and that blood pressure medication dilTIAZem is not helping control BP.  Wondering what should be done next.  Please advise     Thanks  Jules Husbands

## 2020-01-02 ENCOUNTER — Other Ambulatory Visit (HOSPITAL_BASED_OUTPATIENT_CLINIC_OR_DEPARTMENT_OTHER): Payer: Self-pay | Admitting: Family Medicine

## 2020-01-02 MED ORDER — LOSARTAN 100 MG TABLET
100.0000 mg | ORAL_TABLET | Freq: Every day | ORAL | 4 refills | Status: DC
Start: 2020-01-02 — End: 2020-04-16

## 2020-01-02 MED ORDER — TRAMADOL 50 MG TABLET
1.0000 | ORAL_TABLET | Freq: Four times a day (QID) | ORAL | 0 refills | Status: DC | PRN
Start: 2020-01-02 — End: 2020-03-05

## 2020-01-03 NOTE — Telephone Encounter (Signed)
Attempted to call Ms Cortopassi on preferred line to give her message from Dr Alton Revere however, her phone kept cutting out and we could not talk to each other.  Attempted to call the listed mobile number however, there was no answer and I did not leave message.  [place], LPN  07/27/310, 81:18

## 2020-01-04 NOTE — Telephone Encounter (Signed)
Hartley Barefoot, MD  [facility] 2 days ago     Please let pt know that I refilled her pain medication and increased her cozaar for her blood pressure. (I tried to call but could not reach her).     Yetta Glassman. Alton Revere, MD    Message text      Called and spoke with patient-   Notified of above information. Patient wanted to let PCP know that she also resumed taking Hydralazine 25 mg which is on medication list. She reports BP is running 160-170/80s.   She denies any SOB, CP  She does report occasional HA due to allergies. Patient would like to know if Ultram can cause elevated BP- she reports that this medication does help some with pain but she is still having pain.   ED precautions were given.   Wayland Salinas, RN

## 2020-01-25 ENCOUNTER — Other Ambulatory Visit (HOSPITAL_BASED_OUTPATIENT_CLINIC_OR_DEPARTMENT_OTHER): Payer: Self-pay | Admitting: Family Medicine

## 2020-01-30 ENCOUNTER — Ambulatory Visit: Payer: Commercial Managed Care - PPO | Attending: Family Medicine | Admitting: Family Medicine

## 2020-01-30 ENCOUNTER — Encounter (HOSPITAL_BASED_OUTPATIENT_CLINIC_OR_DEPARTMENT_OTHER): Payer: Self-pay | Admitting: Family Medicine

## 2020-01-30 ENCOUNTER — Other Ambulatory Visit (HOSPITAL_BASED_OUTPATIENT_CLINIC_OR_DEPARTMENT_OTHER): Payer: Self-pay

## 2020-01-30 ENCOUNTER — Other Ambulatory Visit: Payer: Self-pay

## 2020-01-30 ENCOUNTER — Ambulatory Visit (HOSPITAL_BASED_OUTPATIENT_CLINIC_OR_DEPARTMENT_OTHER): Payer: Commercial Managed Care - PPO | Admitting: Family Medicine

## 2020-01-30 VITALS — BP 132/78 | HR 80 | Temp 96.6°F | Ht 64.0 in | Wt 353.4 lb

## 2020-01-30 DIAGNOSIS — M25562 Pain in left knee: Secondary | ICD-10-CM

## 2020-01-30 DIAGNOSIS — M25561 Pain in right knee: Secondary | ICD-10-CM

## 2020-01-30 DIAGNOSIS — Z23 Encounter for immunization: Secondary | ICD-10-CM | POA: Insufficient documentation

## 2020-01-30 DIAGNOSIS — I10 Essential (primary) hypertension: Secondary | ICD-10-CM

## 2020-01-30 DIAGNOSIS — G8929 Other chronic pain: Secondary | ICD-10-CM

## 2020-01-30 LAB — DRUG SCREEN, WITH CONFIRMATION, URINE
AMPHETAMINES, URINE: NEGATIVE
BARBITURATES URINE: NEGATIVE
BENZODIAZEPINES URINE: NEGATIVE
BUPRENORPHINE URINE: NEGATIVE
CANNABINOIDS URINE: NEGATIVE
COCAINE METABOLITES URINE: NEGATIVE
CREATININE RANDOM URINE: 136 mg/dL — ABNORMAL HIGH (ref 50–100)
ECSTASY/MDMA URINE: NEGATIVE
FENTANYL, RANDOM URINE: NEGATIVE
METHADONE URINE: NEGATIVE
OPIATES URINE (LOW CUTOFF): NEGATIVE
OXYCODONE URINE: NEGATIVE

## 2020-01-30 NOTE — Nursing Note (Signed)
1. Are you 63 years of age or older? yes  2. Have you ever had a severe reaction to a flu shot? no  3. Are you allergic to eggs? no  4. Are you allergic to latex? no  5. Are you allergic to Thimerosol? no  6. Are you experiencing acute illness symptoms or have you been running a fever? no  7. Do you have a medical condition or taking medications that suppress your immune system? no  8. Have you ever had Guillain-Barre syndrome or other neurologic disorder? no  9. Are you pregnant or breastfeeding? no    Immunization administered     Name Date Dose VIS Date Route    Influenza Vaccine, 6 month-adult 01/30/2020 0.5 mL 11/11/2019 Intramuscular    Site: Left deltoid    Given By: Parks Neptune, RN    Manufacturer: [employer]    Lot: [id number]    NDC: [id number]          Parks Neptune, RN 01/30/2020, 15:49

## 2020-01-30 NOTE — Nursing Note (Signed)
01/30/20 1507   Depression Screen   Little interest or pleasure in doing things. 1   Feeling down, depressed, or hopeless 2   PHQ 2 Total 3   Trouble falling or staying asleep, or sleeping too much. 3   Feeling tired or having little energy 2   Poor appetite or overeating 1   Feeling bad about yourself/ that you are a failure in the past 2 weeks? 0   Trouble concentrating on things in the past 2 weeks? 0   Moving/Speaking slowly or being fidgety or restless  in the past 2 weeks? 1   Thoughts that you would be better off DEAD, or of hurting yourself in some way. 0   If you checked off any problems, how difficult have these problems made it for you to do your work, take care of things at home, or get along with other people? Somewhat difficult   PHQ 9 Total 10   Interpretation of Total Score Moderate depression

## 2020-01-30 NOTE — Progress Notes (Signed)
Subjective: The pt is a 64 y.o. year-old female who reports to clinic for follow up. She is working on having bariatric surgery. She has had the psychologic eval and is working on completing all the final arrangement like nutrition classes. Her knees continue to cause her quite a bit of pain. She takes tramadol which does help take the edge off of her pain. She takes this with her tylenol as well which is helpful. She is also taking Cymbalta 30 mg and this is helping with her pain as well. Soon after starting her pain management she has some shaking. She also noticed this after taking the Symbicort. She is not feeling anxious but feels this has gotten worse. She feels that the pain relief she gets from the Cymbalta is worth the bit of shaking she gets from this. 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 Her mood has been down some. She denies SI or HI. She feels this may be due to the shortening of the days.         ROS: The patient denies fever, chills, night sweats, and sudden change in weight. No HA or vision changes. No pharyngitis. Pt denies SOB or coughing. No CP or palpitations. No abdominal pain, nausea, vomiting, diarrhea. Occasional constipation. No dysuria. No focal weakness, no numbness or tingling. Bilateral knee pain.    Meds: acetaminophen (TYLENOL 8 HOUR) 650 mg Oral Tablet Sustained Release, Take 1,300 mg by mouth Twice daily  albuterol sulfate (PROVENTIL OR VENTOLIN OR PROAIR) 90 mcg/actuation Inhalation HFA Aerosol Inhaler, Take 2 Puffs by inhalation Every 6 hours as needed    budesonide-formoterol (SYMBICORT) 160-4.5 mcg/actuation Inhalation HFA Aerosol Inhaler, Take 2 Puffs by inhalation Twice daily  dilTIAZem (CARDIZEM CD) 300 mg Oral Capsule, Sust. Release 24 hr, Take 1 Capsule (300 mg total) by mouth Once a day  DULoxetine (CYMBALTA DR) 60 mg Oral Capsule, Delayed Release(E.C.), Take 1 Capsule (60 mg total) by mouth Once a day (Patient taking differently: Take  30 mg by mouth Once a day  )  ELIQUIS 5 mg Oral Tablet, TK 1 T PO BID  esomeprazole magnesium (NEXIUM) 20 mg Oral Capsule, Delayed Release(E.C.), Take 20 mg by mouth Once a day  (Patient not taking: Reported on 08/11/2019)  fluticasone propionate (FLONASE) 50 mcg/actuation Nasal Spray, Suspension, 2 Sprays by Each Nostril route Once per day as needed  (Patient not taking: Reported on 11/16/2019 )  furosemide (LASIX) 40 mg Oral Tablet, TAKE 1 TABLET BY MOUTH EVERY DAY (Patient taking differently: Take 60 mg by mouth Once a day Takes 1 1/2 tablet daily)  gabapentin (NEURONTIN) 100 mg Oral Capsule, Take 1 Capsule (100 mg total) by mouth Three times a day (Patient not taking: Reported on 11/14/2019)  hydrALAZINE (APRESOLINE) 25 mg Oral Tablet, Take 1 Tablet (25 mg total) by mouth Three times a day  Ibuprofen (MOTRIN) 600 mg Oral Tablet, Take 600 mg by mouth Once per day as needed for Pain (Patient not taking: Reported on 09/12/2019)  lidocaine (XYLOCAINE) 5 % Ointment, lidocaine Apply 1 application (Topical) 2 times per day for 10 days [id number] ointment 2 times per day Topical 10 days active 5 % (Patient not taking: Reported on 11/14/2019)  losartan (COZAAR) 100 mg Oral Tablet, Take 1 Tablet (100 mg total) by mouth Once a day  melatonin 5 mg Oral Tablet, Chewable, Take 5 mg by mouth Every night   methocarbamoL (ROBAXIN) 500 mg Oral Tablet, Take 1,000 mg by mouth Three  times a day (Patient not taking: Reported on 11/14/2019)  metoprolol succinate (TOPROL-XL) 25 mg Oral Tablet Sustained Release 24 hr, Take 1 Tab (25 mg total) by mouth Once a day  predniSONE (DELTASONE) 10 mg Oral Tablet, Take 40 mg by mouth Once a day (Patient not taking: Reported on 11/14/2019)  traMADoL (ULTRAM) 50 mg Oral Tablet, Take 1 Tablet (50 mg total) by mouth Every 6 hours as needed for Pain    No facility-administered medications prior to visit.      Allergies:   Allergies   Allergen Reactions    Chocolate [Cocoa]     Ciprofloxacin     Peanut Butter  Flavor [Flavoring Agent]      Makes mouth numb    Prednisone      High doses  Other reaction(s): Hypertension (intolerance), Other (See Comments), Other (See Comments)  Increased BP  Raises her blood pressure      Protonix [Pantoprazole]     Sulfa (Sulfonamides)        Objective: BP 132/78 (Site: Left, Patient Position: Sitting, Cuff Size: Adult)    Pulse 80    Temp 35.9 C (96.6 F) (Thermal Scan)    Ht 1.626 m (5\' 4" ) Comment: per pt refused   Wt (!) 160 kg (353 lb 6.4 oz)    SpO2 95%    BMI 60.66 kg/m   Constitutional: NAD  HEENT: TM's clear, non-bulging. PERRL. Marland Kitchen No cervical lymphadenopathy.  Respiratory: CTA bilaterally. No wheezes, rhonchi, rales or crackles.  CV: RRR. No Murmurs, gallops, or rubs.  Abd: bowel sounds present in all 4 quadrants. Non-tender.  Extremity: 4+ edema.  Integument: bullae from edema on the right leg.   Neuro: CN II-XII grossly intact.   Musculoskeletal: arthritis knees. She is in a wheelchair. Pain with palpation of the knees bilaterally.     Assessment and Plan:   1. Chronic knee pain: urine drug screen ordered today. Pain contract signed. She will cont tramadol and Cymbalta. BOP reviewed and is appropriate.   2. Morbid obesity: she is moving toward bariatric surgery. Cont to follow.   3. HTN: cont metoprolol and cozaar, BP controlled.   4. HCM: influenza vaccine given today.     Yetta Glassman. Alton Revere, MD

## 2020-01-31 ENCOUNTER — Other Ambulatory Visit (HOSPITAL_COMMUNITY): Payer: Self-pay

## 2020-01-31 ENCOUNTER — Encounter (HOSPITAL_BASED_OUTPATIENT_CLINIC_OR_DEPARTMENT_OTHER): Payer: Self-pay | Admitting: Obstetrics & Gynecology

## 2020-01-31 ENCOUNTER — Telehealth (HOSPITAL_BASED_OUTPATIENT_CLINIC_OR_DEPARTMENT_OTHER): Payer: Self-pay | Admitting: Obstetrics & Gynecology

## 2020-01-31 DIAGNOSIS — K8020 Calculus of gallbladder without cholecystitis without obstruction: Secondary | ICD-10-CM

## 2020-01-31 NOTE — Telephone Encounter (Signed)
Returned phone call to pt who is inquiring about who she may follow up with for her endometrial cancer. LM on HP letting her know our two MD's names and asked her to call us back and ask to schedule with one of them. Any issues she may ask for me so we can talk further.

## 2020-02-02 ENCOUNTER — Encounter (HOSPITAL_BASED_OUTPATIENT_CLINIC_OR_DEPARTMENT_OTHER): Payer: Self-pay | Admitting: Family Medicine

## 2020-02-04 ENCOUNTER — Other Ambulatory Visit (HOSPITAL_COMMUNITY): Payer: Self-pay

## 2020-02-04 ENCOUNTER — Ambulatory Visit
Admission: RE | Admit: 2020-02-04 | Discharge: 2020-02-04 | Disposition: A | Discharge status: Home / Self Care | Payer: Commercial Managed Care - PPO | Source: Ambulatory Visit

## 2020-02-04 ENCOUNTER — Other Ambulatory Visit: Payer: Self-pay

## 2020-02-04 DIAGNOSIS — K8020 Calculus of gallbladder without cholecystitis without obstruction: Secondary | ICD-10-CM

## 2020-02-06 ENCOUNTER — Other Ambulatory Visit (HOSPITAL_BASED_OUTPATIENT_CLINIC_OR_DEPARTMENT_OTHER): Payer: Self-pay | Admitting: Family Medicine

## 2020-02-06 ENCOUNTER — Encounter (HOSPITAL_BASED_OUTPATIENT_CLINIC_OR_DEPARTMENT_OTHER): Payer: Self-pay | Admitting: Family Medicine

## 2020-02-06 DIAGNOSIS — R5383 Other fatigue: Secondary | ICD-10-CM

## 2020-02-06 DIAGNOSIS — Z6841 Body Mass Index (BMI) 40.0 and over, adult: Secondary | ICD-10-CM

## 2020-02-06 NOTE — Nursing Note (Signed)
Fax sent to Dr. Caleb Popp 02/06/20. Confirmation received.    Salome Spotted, LPN

## 2020-02-09 ENCOUNTER — Encounter (HOSPITAL_BASED_OUTPATIENT_CLINIC_OR_DEPARTMENT_OTHER): Payer: Commercial Managed Care - PPO | Admitting: Obstetrics & Gynecology

## 2020-02-09 ENCOUNTER — Ambulatory Visit (HOSPITAL_BASED_OUTPATIENT_CLINIC_OR_DEPARTMENT_OTHER): Payer: Self-pay | Admitting: Obstetrics & Gynecology

## 2020-02-09 NOTE — Telephone Encounter (Signed)
-----   Message from [facility] sent at 02/09/2020 12:18 PM EDT -----  Pfaendler pt   Pt canceled her appt for today and is rescheduled for 11.12.21

## 2020-02-15 ENCOUNTER — Encounter (HOSPITAL_BASED_OUTPATIENT_CLINIC_OR_DEPARTMENT_OTHER): Payer: Self-pay | Admitting: Obstetrics & Gynecology

## 2020-02-17 ENCOUNTER — Ambulatory Visit: Payer: Commercial Managed Care - PPO | Attending: Obstetrics & Gynecology | Admitting: Obstetrics & Gynecology

## 2020-02-17 ENCOUNTER — Other Ambulatory Visit: Payer: Self-pay

## 2020-02-17 VITALS — BP 185/97 | HR 90 | Temp 98.1°F | Ht 64.02 in | Wt 348.8 lb

## 2020-02-17 DIAGNOSIS — C541 Malignant neoplasm of endometrium: Secondary | ICD-10-CM

## 2020-02-17 DIAGNOSIS — Z90710 Acquired absence of both cervix and uterus: Secondary | ICD-10-CM | POA: Insufficient documentation

## 2020-02-17 DIAGNOSIS — Z9889 Other specified postprocedural states: Secondary | ICD-10-CM | POA: Insufficient documentation

## 2020-02-17 DIAGNOSIS — Z90722 Acquired absence of ovaries, bilateral: Secondary | ICD-10-CM | POA: Insufficient documentation

## 2020-02-17 DIAGNOSIS — N183 Chronic kidney disease, stage 3 unspecified (CMS HCC): Secondary | ICD-10-CM

## 2020-02-17 DIAGNOSIS — I10 Essential (primary) hypertension: Secondary | ICD-10-CM

## 2020-02-17 DIAGNOSIS — Z923 Personal history of irradiation: Secondary | ICD-10-CM | POA: Insufficient documentation

## 2020-02-17 DIAGNOSIS — Z8542 Personal history of malignant neoplasm of other parts of uterus: Secondary | ICD-10-CM | POA: Insufficient documentation

## 2020-02-17 NOTE — Cancer Center Note (Signed)
Gynecology Oncology Progress Note    CC: Surveillance Exam    HPI: Alexandra Nunez is a 64 y.o. 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 S/p RATLH/BSO in 2016 and 50 Gy whole pelvic radiation. She was previously following in [place] for surveillance.       She presents today for her surveillance visit. Patient was due for a surveillance exam 1/21.  Doing well, no complaints. Denies fatigue, chest pain, shortness of breath, vaginal bleeding, discharge, abdominal pain, change in appetite, nausea, vomiting, change in bowel frequency, urinary symptoms.    She is planning to have bariatric surgery 03/19/20. She has required multiple cardioconversions recently for her AFib with RVR.    The patient has an order for her 3D mammogram. She said that she has not had a mammogram in 2 years.    Oncologic History:    Oncology History   Endometrial cancer (CMS [place])   01/11/2013 Biopsy/Results    Endometrial biopsy Gyn in [place]: FIGO grade 1 endometrioid adenocarcinoma     03/23/2013 Imaging    CT Abdomen and pelvis: Enlarged right ovary measuring 5.4 x 4.2 x 3.6cm. My lymphadenopathy     04/2013 Surgery    D&C, Mirena IUD placement    Persistent FIGO Grade 1 Endometrioid Adenocarcinoma on EMBx     07/07/2014 Initial Diagnosis    Uterine cancer (CMS HCC)     03/15/2015 Surgery    Genia Del, MD)  Robotic assisted total laparoscopic hysterectomy, BSO  Unable to assess lymph nodes due to body habitus     03/15/2015 Biopsy/Results    PATHOLOGY:  Uterus and cervix with bilateral adnexa, excision:     Endometrial carcinoma, FIGO grade 1  -Transmural muscle invasion present  Leiomyomata, intramural (confirmed with SMA and CD10)  Benign bilateral ovaries with cystic endometriosis   Benign fallopian tubes  Intrauterine device in uterine cavity  Negative surgical margins    EXTENT     Tumor Size:    Greatest dimension (cm)       Additional  Dimension (cm):    3 cm       Additional Dimension (cm):    3 cm     Myometrial Invasion (Note D):    Present       Depth of Myometrial Invasion:    Specify depth of invasion (mm): 21 mm       Myometrial Thickness (mm):    21 mm     Involvement of Cervix (Note E):    Not involved     Other Organs Submitted:    Right ovary       Involvement Status of Right Ovary:    Not involved     Other Organs Submitted:    Left ovary       Involvement Status of Left Ovary:    Not involved     Other Organs Submitted:    Right fallopian tube       Involvement Status of Right Fallopian Tube:    Not involved     Other Organs Submitted:    Left fallopian tube       Involvement Status of Left Fallopian Tube:    Not involved    The tumor consists of scattered infiltrative glands extending deep into the myometrium resulting in a serosal reaction; there is no tumor on the surface of the serosa itself.     The endometrial adenocarcinoma was analyzed by [facility]  for DNA mismatch repair proteins. The neoplasm retained nuclear expression of all four mismatch repair proteins, MLH1, PMS2, MSH2, MSH6. Positive and negative controls worked appropriately.     06/14/2015 Imaging    CT Chest, Abdomen, and Pelvis w/ contrast:      IMPRESSION:  No acute findings or evidence metastatic disease.     07/04/2015 - 08/10/2015 Radiation Therapy    Dr. Tori Milks ([place], [place])  EBRT 50 Gy in 25 fractions     12/31/2015 Biopsy/Results    Pap: negative for intraepithelial lesion and malignancy     10/25/2018 Biopsy/Results    Final Cytologic Diagnosis   A.  Vaginal Thin Prep Cytology:         Satisfactory for evaluation.         NEGATIVE FOR INTRAEPITHELIAL LESION OR MALIGNANCY                             Review of Systems:  Complete review of systems negative except as per HPI    Physical Exam:  Vital signs: BP (Non-Invasive): (!) 185/97  Heart Rate: 90  Temperature: 36.7 C (98.1 F)     Height: 162.6 cm (5' 4.02")  Weight: (!) 158 kg (348 lb 12.3  oz)  Constitutional: well appearing, NAD, AAOx4, appears older than stated age, morbid obesity  Eyes: extraocular movements intact  ENT: moist mucus membranes, trachea midline  CV: regular rate and rhythm, no murmurs; +1 lower extremity pitting edema  Resp: labored breathing, clear to auscultation   GI: abdomen soft, nontender, no rebound or guarding, no palpable masses, acanthosis noted under panus and in groin  GU: normal external female genitalia, vaginal apex intact, no visible lesions or masses but exam was extremely limited due to body habitus, no palpable masses or nodularity   Rectovaginal: septum smooth, no masses or nodularity   Lymphatic: no palpable supraclavicular lymph nodes   Musculoskeletal: left lower extremity venous stasis  Neuro: no gross motor deficits.  Full neurologic exam not performed.  Psych: normal mood and affect    Assessment: 64 y.o. 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 No lymph node sampling was performed, so she received adjuvant radiation (50 Gy whole pelvis) to treat the pelvic nodes.  NED x nearly 5 years (surgery 03/2015)    Plan:   1. No evidence of disease on exam today  2. Discussed with patient that she can return to normal gynecologic well woman care. Patient would like to follow with a female provider at [facility].  [place] Vaccine 11/14/19, 12/19/19  4. Flu Vaccine 01/30/20    Jabier Gauss, MD 02/17/2020 17:05  PGY-4  [facility]  Department of Obstetrics & Gynecology        I saw and examined the patient. I reviewed the resident's note. I agree with the findings and plan of care as documented in the resident's note. Any exceptions/additions are edited/noted.    Altamease Oiler, MD

## 2020-02-24 ENCOUNTER — Other Ambulatory Visit: Payer: Self-pay

## 2020-02-24 ENCOUNTER — Ambulatory Visit: Payer: Commercial Managed Care - PPO | Attending: Family Medicine

## 2020-02-24 ENCOUNTER — Telehealth: Payer: Self-pay | Admitting: Internal Medicine

## 2020-02-24 DIAGNOSIS — Z6841 Body Mass Index (BMI) 40.0 and over, adult: Secondary | ICD-10-CM | POA: Insufficient documentation

## 2020-02-24 DIAGNOSIS — R5383 Other fatigue: Secondary | ICD-10-CM | POA: Insufficient documentation

## 2020-02-24 LAB — CBC WITH DIFF
BASOPHIL #: <0.1 10*3/uL (ref <=0.20)
BASOPHIL %: 1 %
EOSINOPHIL #: 0.23 10*3/uL (ref <=0.50)
EOSINOPHIL %: 3 %
HCT: 41.8 % (ref 34.8–46.0)
HGB: 14.1 g/dL (ref 11.5–16.0)
IMMATURE GRANULOCYTE #: <0.1 10*3/uL (ref <0.10)
IMMATURE GRANULOCYTE %: 0 % (ref 0–1)
LYMPHOCYTE #: 1.18 10*3/uL (ref 1.00–4.80)
LYMPHOCYTE %: 16 %
MCH: 30.2 pg (ref 26.0–32.0)
MCHC: 33.7 g/dL (ref 31.0–35.5)
MCV: 89.5 fL (ref 78.0–100.0)
MONOCYTE #: 0.46 10*3/uL (ref 0.20–1.10)
MONOCYTE %: 6 %
MPV: 9.5 fL (ref 8.7–12.5)
NEUTROPHIL #: 5.4 10*3/uL (ref 1.50–7.70)
NEUTROPHIL %: 74 %
PLATELETS: 277 10*3/uL (ref 150–400)
RBC: 4.67 10*6/uL (ref 3.85–5.22)
RDW-CV: 13 % (ref 11.5–15.5)
WBC: 7.3 10*3/uL (ref 3.7–11.0)

## 2020-02-24 LAB — LIPID PANEL
CHOL/HDL RATIO: 4.3
CHOLESTEROL: 164 mg/dL (ref 100–200)
HDL CHOL: 38 mg/dL — ABNORMAL LOW (ref >=50)
LDL CALC: 101 mg/dL — ABNORMAL HIGH (ref <100)
NON-HDL: 126 mg/dL (ref <=190)
TRIGLYCERIDES: 125 mg/dL (ref <150)
VLDL CALC: 25 mg/dL (ref <30)

## 2020-02-24 LAB — THYROID STIMULATING HORMONE WITH FREE T4 REFLEX: TSH: 1.301 u[IU]/mL (ref 0.430–3.550)

## 2020-02-24 LAB — BASIC METABOLIC PANEL
ANION GAP: 10 mmol/L (ref 4–13)
BUN/CREA RATIO: 13 (ref 6–22)
BUN: 12 mg/dL (ref 8–25)
CALCIUM: 9.1 mg/dL (ref 8.8–10.2)
CHLORIDE: 103 mmol/L (ref 96–111)
CO2 TOTAL: 30 mmol/L (ref 23–31)
CREATININE: 0.92 mg/dL (ref 0.60–1.05)
ESTIMATED GFR: 66 mL/min/BSA (ref >=60)
GLUCOSE: 118 mg/dL (ref 65–125)
POTASSIUM: 3.8 mmol/L (ref 3.5–5.1)
SODIUM: 143 mmol/L (ref 136–145)

## 2020-02-24 NOTE — Telephone Encounter (Signed)
LMTCB

## 2020-02-27 NOTE — Telephone Encounter (Signed)
Magda Paganini, have you received this?

## 2020-02-28 NOTE — Telephone Encounter (Signed)
I don't do sleep medicine  - whoever ordered the test is responsible for reviewing it with you and if need be referring you to sleep medicine specialist for preop clearance.   From a pulmonary perspective though as long as no more sob or cough than baseline ok with me to clear for  surgery.

## 2020-02-28 NOTE — Telephone Encounter (Signed)
FYI for MW>   She was last seen 10/26/2019.  MW please advise if she will need to come in for this clearance.  Thanks

## 2020-03-05 ENCOUNTER — Other Ambulatory Visit (HOSPITAL_BASED_OUTPATIENT_CLINIC_OR_DEPARTMENT_OTHER): Payer: Self-pay | Admitting: Family Medicine

## 2020-03-05 ENCOUNTER — Encounter (HOSPITAL_BASED_OUTPATIENT_CLINIC_OR_DEPARTMENT_OTHER): Payer: Self-pay | Admitting: Family Medicine

## 2020-03-05 MED ORDER — TRAMADOL 50 MG TABLET
1.0000 | ORAL_TABLET | Freq: Four times a day (QID) | ORAL | 0 refills | Status: DC | PRN
Start: 2020-03-05 — End: 2020-04-16

## 2020-03-05 NOTE — Telephone Encounter (Signed)
-----   Message from [facility] sent at 03/05/2020 10:07 AM EST -----  Regarding: Refill Tramadol  Please send refill on Tramadol to the [facility] in [place].     Thank you

## 2020-03-08 NOTE — Telephone Encounter (Signed)
See pt email on 11.22.21. Will close encounter.

## 2020-03-22 ENCOUNTER — Other Ambulatory Visit (HOSPITAL_BASED_OUTPATIENT_CLINIC_OR_DEPARTMENT_OTHER): Payer: Self-pay | Admitting: Family Medicine

## 2020-03-29 ENCOUNTER — Ambulatory Visit
Admission: RE | Admit: 2020-03-29 | Discharge: 2020-03-29 | Disposition: A | Discharge status: Home / Self Care | Payer: Commercial Managed Care - PPO | Source: Ambulatory Visit | Attending: Family Medicine | Admitting: Family Medicine

## 2020-03-29 ENCOUNTER — Other Ambulatory Visit: Payer: Self-pay

## 2020-03-29 ENCOUNTER — Encounter (HOSPITAL_COMMUNITY): Payer: Self-pay

## 2020-03-29 DIAGNOSIS — Z1239 Encounter for other screening for malignant neoplasm of breast: Secondary | ICD-10-CM

## 2020-03-29 DIAGNOSIS — Z1231 Encounter for screening mammogram for malignant neoplasm of breast: Secondary | ICD-10-CM | POA: Insufficient documentation

## 2020-04-12 ENCOUNTER — Ambulatory Visit (HOSPITAL_BASED_OUTPATIENT_CLINIC_OR_DEPARTMENT_OTHER): Payer: Self-pay | Admitting: Family Medicine

## 2020-04-12 NOTE — Telephone Encounter (Signed)
Regarding: Alexandra Nunez  \\  rx refill request  ----- Message from [facility] sent at 04/12/2020 10:44 AM EST -----  Wonda Horner, MD    Nunez called in requesting a refill for the following Rx:    acetaminophen (TYLENOL 8 HOUR) 650 mg Oral Tablet Sustained Release, Take 1,300 mg by mouth Twice daily  albuterol sulfate (PROVENTIL OR VENTOLIN OR PROAIR) 90 mcg/actuation Inhalation HFA Aerosol Inhaler, Take 2 Puffs by inhalation Every 4 hours as needed  budesonide-formoterol (SYMBICORT) 160-4.5 mcg/actuation Inhalation HFA Aerosol Inhaler, Take 2 Puffs by inhalation Twice daily  dilTIAZem (CARDIZEM CD) 240 mg Oral Capsule, Sust. Release 24 hr, Take 1 Capsule (240 mg total) by mouth Once a day  dilTIAZem (CARDIZEM CD) 300 mg Oral Capsule, Sust. Release 24 hr, Take 1 Capsule (300 mg total) by mouth Once a day  DULoxetine (CYMBALTA DR) 60 mg Oral Capsule, Delayed Release(E.C.), Take 1 Capsule (60 mg total) by mouth Once a day (Patient taking differently: Take 30 mg by mouth Once a day  )  ELIQUIS 5 mg Oral Tablet, TK 1 T PO BID  esomeprazole magnesium (NEXIUM) 20 mg Oral Capsule, Delayed Release(E.C.), Take 20 mg by mouth Once a day  (Patient not taking: Reported on 08/11/2019)  fluticasone propionate (FLONASE) 50 mcg/actuation Nasal Spray, Suspension, 2 Sprays by Each Nostril route Once per day as needed  (Patient not taking: Reported on 11/16/2019 )  furosemide (LASIX) 40 mg Oral Tablet, TAKE 1 TABLET BY MOUTH EVERY DAY (Patient taking differently: Take 60 mg by mouth Once a day Takes 1 1/2 tablet daily)  gabapentin (NEURONTIN) 100 mg Oral Capsule, Take 1 Capsule (100 mg total) by mouth Three times a day (Patient not taking: Reported on 11/14/2019)  hydrALAZINE (APRESOLINE) 25 mg Oral Tablet, Take 1 Tablet (25 mg total) by mouth Three times a day  Ibuprofen (MOTRIN) 600 mg Oral Tablet, Take 600 mg by mouth Once per day as needed for Pain (Patient not taking: Reported on 09/12/2019)  lidocaine (XYLOCAINE) 5 % Ointment, lidocaine  Apply 1 application (Topical) 2 times per day for 10 days [id number] ointment 2 times per day Topical 10 days active 5 % (Patient not taking: lidocaine Apply 1 application (Topical) 2 times per day for 10 days [id number] ointment 2 times per day Topical 10 days active 5 %)  losartan (COZAAR) 100 mg Oral Tablet, Take 1 Tablet (100 mg total) by mouth Once a day  melatonin 5 mg Oral Tablet, Chewable, Take 5 mg by mouth Every night   methocarbamoL (ROBAXIN) 500 mg Oral Tablet, Take 1,000 mg by mouth Three times a day (Patient not taking: Reported on 11/14/2019)  metoprolol succinate (TOPROL-XL) 25 mg Oral Tablet Sustained Release 24 hr, Take 1 Tab (25 mg total) by mouth Once a day  predniSONE (DELTASONE) 10 mg Oral Tablet, Take 40 mg by mouth Once a day (Patient not taking: Reported on 11/14/2019)  traMADoL (ULTRAM) 50 mg Oral Tablet, Take 1 Tablet (50 mg total) by mouth Every 6 hours as needed for Pain    [id number] [place],  - [address]   [address] [facility] [address]   Phone: [phone] Fax: [phone]   Hours: Not open 24 hours     Thanks,  Marchelle Folks

## 2020-04-14 ENCOUNTER — Encounter (HOSPITAL_BASED_OUTPATIENT_CLINIC_OR_DEPARTMENT_OTHER): Payer: Commercial Managed Care - PPO | Admitting: Family Medicine

## 2020-04-16 ENCOUNTER — Other Ambulatory Visit: Payer: Self-pay

## 2020-04-16 ENCOUNTER — Ambulatory Visit: Payer: Medicare PPO | Attending: Family Medicine | Admitting: Family Medicine

## 2020-04-16 ENCOUNTER — Encounter (HOSPITAL_BASED_OUTPATIENT_CLINIC_OR_DEPARTMENT_OTHER): Payer: Commercial Managed Care - PPO | Admitting: Physician Assistant

## 2020-04-16 DIAGNOSIS — M25569 Pain in unspecified knee: Secondary | ICD-10-CM

## 2020-04-16 DIAGNOSIS — G2581 Restless legs syndrome: Secondary | ICD-10-CM

## 2020-04-16 DIAGNOSIS — I10 Essential (primary) hypertension: Secondary | ICD-10-CM

## 2020-04-16 DIAGNOSIS — G4733 Obstructive sleep apnea (adult) (pediatric): Secondary | ICD-10-CM | POA: Insufficient documentation

## 2020-04-16 DIAGNOSIS — R059 Cough, unspecified: Secondary | ICD-10-CM

## 2020-04-16 MED ORDER — HYDRALAZINE 25 MG TABLET
25.0000 mg | ORAL_TABLET | Freq: Three times a day (TID) | ORAL | 3 refills | Status: AC
Start: 2020-04-16 — End: ?

## 2020-04-16 MED ORDER — ROPINIROLE 0.5 MG TABLET
0.5000 mg | ORAL_TABLET | Freq: Every evening | ORAL | 3 refills | Status: DC
Start: 2020-04-16 — End: 2020-11-05

## 2020-04-16 MED ORDER — DILTIAZEM CD 300 MG CAPSULE,EXTENDED RELEASE 24 HR
300.0000 mg | ORAL_CAPSULE | Freq: Every day | ORAL | 4 refills | Status: DC
Start: 2020-04-16 — End: 2020-07-24

## 2020-04-16 MED ORDER — ROPINIROLE 0.5 MG TABLET
0.5000 mg | ORAL_TABLET | Freq: Every evening | ORAL | 3 refills | Status: DC
Start: 2020-04-16 — End: 2020-04-16

## 2020-04-16 MED ORDER — DOXYCYCLINE HYCLATE 100 MG CAPSULE
100.0000 mg | ORAL_CAPSULE | Freq: Two times a day (BID) | ORAL | 0 refills | Status: DC
Start: 2020-04-16 — End: 2020-11-05

## 2020-04-16 MED ORDER — DULOXETINE 30 MG CAPSULE,DELAYED RELEASE
30.0000 mg | DELAYED_RELEASE_CAPSULE | Freq: Every day | ORAL | 3 refills | Status: DC
Start: 2020-04-16 — End: 2020-11-05

## 2020-04-16 MED ORDER — TRAMADOL 50 MG TABLET
1.0000 | ORAL_TABLET | Freq: Four times a day (QID) | ORAL | 0 refills | Status: DC | PRN
Start: 2020-04-16 — End: 2020-05-08

## 2020-04-16 MED ORDER — ALBUTEROL SULFATE HFA 90 MCG/ACTUATION AEROSOL INHALER
2.0000 | INHALATION_SPRAY | RESPIRATORY_TRACT | 5 refills | Status: AC | PRN
Start: 2020-04-16 — End: ?

## 2020-04-16 MED ORDER — DILTIAZEM CD 240 MG CAPSULE,EXTENDED RELEASE 24 HR
240.0000 mg | ORAL_CAPSULE | Freq: Every day | ORAL | 3 refills | Status: AC
Start: 2020-04-16 — End: ?

## 2020-04-16 MED ORDER — METOPROLOL SUCCINATE ER 25 MG TABLET,EXTENDED RELEASE 24 HR
25.0000 mg | ORAL_TABLET | Freq: Every day | ORAL | 3 refills | Status: DC
Start: 2020-04-16 — End: 2020-09-10

## 2020-04-16 MED ORDER — LOSARTAN 100 MG TABLET
100.0000 mg | ORAL_TABLET | Freq: Every day | ORAL | 4 refills | Status: DC
Start: 2020-04-16 — End: 2021-08-01

## 2020-04-16 MED ORDER — FUROSEMIDE 40 MG TABLET
60.0000 mg | ORAL_TABLET | Freq: Every day | ORAL | 3 refills | Status: DC
Start: 2020-04-16 — End: 2020-11-05

## 2020-04-16 NOTE — Progress Notes (Signed)
FAMILY MEDICINE, [facility]  [place] [address]  Operated by [place]  Telephone Visit    Name:  Floree Zuniga MRN: [id number]   Date:  04/16/2020 Age:   65 y.o.     The patient/family initiated a request for telephone service.  Verbal consent for this service was obtained from the patient/family.    Last office visit in this department: 01/30/2020      Reason for call: follow up    Call notes:  The pt is a 65 yo female who reports for follow up of Morbid obesity, chronic knee pain, HTN, CKD, a-fib. She was planning on having bariatric surgery and did have a sleep study. She was found to have COPD by her pulmonologist. She is questioning this diagnosis. She has never been a smoker and does not feel dyspnea. This testing was done in [place]. She does have a deep cough which has ben present for the last 2 months. She got a new inhaler Spiriva in addition to her Symbicort. She was also diagnosed with OSA and has been prescribed a BiPAP. She is hopeful that she will be cleared for surgery soon once she gets the BiPAP. She continues to have knee pain which is worsening with the cold weather. She is still taking the ultram and the Cymbalta. She feels this is helping somewhat. She feels that her knees are unstable. Lately she has been having some RLS at night. It feels like there is "something crawling inside them" and like she needs to move her legs. Over the last few days she has had cough with production of sputum which is green in color.     Plan:   1.OSA: this is a new diagnosis. She has been prescribed a BiPAP and is awaiting for this to arrive. She is hopeful that this will optimize her lung function prior to surgery.   2.Morbid obesity:  She is awaiting clearance by her surgeon for this surgery.   3.RLS: will start Requip at 0.5mg  nightly.   4.Chronic knee pain: this is still significant. Will continue pain medication.   5.HTN: continue current  medication.   6. Cough: doxycycline for 7 days.       Total provider time spent with the patient on the phone: 31 minutes.    Hartley Barefoot, MD

## 2020-05-02 ENCOUNTER — Encounter (INDEPENDENT_AMBULATORY_CARE_PROVIDER_SITE_OTHER): Payer: Self-pay | Admitting: INTERNAL MEDICINE CARDIOVASCULAR DISEASE

## 2020-05-02 ENCOUNTER — Encounter (INDEPENDENT_AMBULATORY_CARE_PROVIDER_SITE_OTHER): Payer: Self-pay

## 2020-05-02 NOTE — Telephone Encounter (Addendum)
Unable to prescribe Eliquis on the 340B plan due to not being affiliated with [facility]. Spoke with patient and educated patient on options. Patient requesting changing to warfarin. Will send message to APP to send in script.  [place], RN  05/02/2020, 10:53    [place], [name], NP  [place], [place], RN  Since she is taking this for A-Fib, we recommend having her transitioned per her PCP with close monitoring of her INR's. I think our coumadin clinic operating out of [name] has a pretty heavy patient load right now.     Thanks!   [name], NP 05/02/2020, 11:40       Message sent to patient for PCP to write script and monitor INR.   [facility], RN  05/02/2020, 13:46

## 2020-05-03 ENCOUNTER — Telehealth (INDEPENDENT_AMBULATORY_CARE_PROVIDER_SITE_OTHER): Payer: Self-pay | Admitting: INTERNAL MEDICINE CARDIOVASCULAR DISEASE

## 2020-05-03 ENCOUNTER — Other Ambulatory Visit (HOSPITAL_BASED_OUTPATIENT_CLINIC_OR_DEPARTMENT_OTHER): Payer: Self-pay | Admitting: Family Medicine

## 2020-05-03 NOTE — Telephone Encounter (Signed)
Regarding: Rx refill   ----- Message from [place] sent at 05/03/2020  2:45 PM EST -----  Hartley Barefoot, MD  Pt states Dr Deatra Canter office was suppose to send a paper over to Dr Alton Revere for her to start giving pt Eliquis 5 mg 2 times daily      ELIQUIS 5 mg Oral Tablet   10/13/2018   Sig: TK 1 T PO BID    Route: (none)    DAW: Yes      Preferred [place], [place]    [place] [address]    Phone: [phone] Fax: [phone]    Hours: Not open 24 hours      Thank You Rollene Fare

## 2020-05-03 NOTE — Telephone Encounter (Addendum)
Dr Deatra Canter,  I recently changed my primary physician and she is no longer part of [facility].  I have been getting my Eliquis from the pharmacy there at [facility] on a special assistance program.  I was told yesterday I can't continue to get my prescription there unless I have a doctor affiliated with [facility].  Please prescribe Eliquis for me and send it to the pharmacy there.   Your help is appreciated.    Thank you  Alexandra Nunez        Unable to prescribe Eliquis on the 340B plan due to not being affiliated with [facility]. Spoke with patient and educated patient on options. Patient requesting changing to warfarin. Will send message to APP to send in script.  [place], RN  05/02/2020, 10:53        Nunez, [name], NP  [place], [place], RN  Since she is taking this for A-Fib, we recommend having her transitioned per her PCP with close monitoring of her INR's. I think our coumadin clinic operating out of [name] has a pretty heavy patient load right now.     Thanks!   Alexandra Aly, NP 05/02/2020, 11:40         Message sent to patient for PCP to write script and monitor INR.   [place], RN  05/02/2020, 13:46      Me to Alexandra Nunez  We are unable to get you into our coumadin clinic for monitoring INR. Can you reach out to your PCP for them to order and follow your INRs?      Alexandra Nunez to Me   Why is it so difficult to get me into the Coumadin clinic.  I was in it last year or year before.  My PCP is in [place], and it would be hard for me to go every week for that.       Called and spoke with [name] about if medication would be covered if Dr Deatra Canter wrote the prescription and it was filled by them and mailed to the patient. [name] staff to call back once they check eligibility of Dr Deatra Canter in 340B program.   [facility], RN  05/03/2020, 11:33        Spoke with [facility] that states the [name] is not eligible for the [name].   [employer], RN  05/03/2020, 12:08        Spoke to [name] about PCP prescribing Eliquis and it being covered under 340B plan, they state she is covered and they would be able to mail the medications to her. Notified patient and she is to reach out to [name] office to get the script sent in.  [facility], RN  05/03/2020, 14:34

## 2020-05-04 MED ORDER — ELIQUIS 5 MG TABLET
ORAL_TABLET | ORAL | 1 refills | Status: DC
Start: 2020-05-04 — End: 2020-05-07

## 2020-05-07 ENCOUNTER — Encounter (HOSPITAL_BASED_OUTPATIENT_CLINIC_OR_DEPARTMENT_OTHER): Payer: Self-pay | Admitting: Family Medicine

## 2020-05-07 ENCOUNTER — Encounter (HOSPITAL_BASED_OUTPATIENT_CLINIC_OR_DEPARTMENT_OTHER): Payer: Commercial Managed Care - PPO | Admitting: Family Medicine

## 2020-05-07 NOTE — Telephone Encounter (Signed)
From: Alexandra Nunez  To: Hartley Barefoot, MD  Sent: 05/07/2020 1:42 PM EST  Subject: Out of Eliquis for past week    Dear Dr Alton Revere,    When I changed my primary from [facility] to your practice I lost this special pharmacy program for my Eliquis. They have the same program there at [facility]. They will mail it to me. Please take over writing my prescription for Eliquis and send it to the pharmacy at [facility].    Thank you for helping me get this done.    Alexandra Nunez

## 2020-05-08 ENCOUNTER — Other Ambulatory Visit (HOSPITAL_BASED_OUTPATIENT_CLINIC_OR_DEPARTMENT_OTHER): Payer: Self-pay | Admitting: Family Medicine

## 2020-05-08 MED ORDER — ELIQUIS 5 MG TABLET
ORAL_TABLET | ORAL | 1 refills | Status: DC
Start: 2020-05-08 — End: 2020-09-10

## 2020-05-08 MED ORDER — TRAMADOL 50 MG TABLET
1.0000 | ORAL_TABLET | Freq: Four times a day (QID) | ORAL | 0 refills | Status: DC | PRN
Start: 2020-05-16 — End: 2020-07-19

## 2020-05-08 NOTE — Telephone Encounter (Signed)
Regarding: refill  ----- Message from Flonnie Hailstone II sent at 05/08/2020  3:08 PM EST -----  Hartley Barefoot, MD    Pt calling in requesting a refill of the below medication. Please advise. Thank you.    traMADoL (ULTRAM) 50 mg Oral Tablet 120 Tablet 0 04/16/2020    Sig - Route: Take 1 Tablet (50 mg total) by mouth Every 6 hours as needed for Pain - Oral   Sent to pharmacy as: traMADoL 50 mg tablet Veatrice Bourbon)   Class: E-Rx   Non-formulary Exception Code: VOHKG/OV Formulary Info Available     Preferred Pharmacy     [id number] - [place], [place] [address]    Phone: [phone] Fax: [phone]    Hours: Not open 24 hours

## 2020-05-11 ENCOUNTER — Ambulatory Visit (HOSPITAL_BASED_OUTPATIENT_CLINIC_OR_DEPARTMENT_OTHER): Payer: Medicare PPO | Admitting: Specialist

## 2020-06-16 ENCOUNTER — Other Ambulatory Visit (HOSPITAL_BASED_OUTPATIENT_CLINIC_OR_DEPARTMENT_OTHER): Payer: Self-pay | Admitting: Family Medicine

## 2020-06-19 ENCOUNTER — Other Ambulatory Visit: Payer: Self-pay

## 2020-06-19 MED ORDER — ELIQUIS 5 MG TABLET
5.0000 mg | ORAL_TABLET | Freq: Two times a day (BID) | ORAL | 3 refills | Status: DC
Start: 2020-05-08 — End: 2021-05-06
  Filled 2020-06-19: qty 60, 30d supply, fill #0
  Filled 2020-07-31: qty 60, 30d supply, fill #1
  Filled 2020-09-19: qty 60, 30d supply, fill #2

## 2020-06-20 ENCOUNTER — Other Ambulatory Visit: Payer: Self-pay

## 2020-06-27 ENCOUNTER — Encounter (HOSPITAL_BASED_OUTPATIENT_CLINIC_OR_DEPARTMENT_OTHER): Payer: Self-pay | Admitting: Student in an Organized Health Care Education/Training Program

## 2020-06-27 ENCOUNTER — Encounter (HOSPITAL_BASED_OUTPATIENT_CLINIC_OR_DEPARTMENT_OTHER): Payer: Self-pay | Admitting: FAMILY MEDICINE

## 2020-06-29 ENCOUNTER — Encounter (HOSPITAL_BASED_OUTPATIENT_CLINIC_OR_DEPARTMENT_OTHER): Payer: Commercial Managed Care - PPO | Admitting: Family Medicine

## 2020-07-04 ENCOUNTER — Other Ambulatory Visit: Payer: Self-pay

## 2020-07-04 ENCOUNTER — Ambulatory Visit: Payer: Medicare PPO | Attending: Family Medicine | Admitting: FAMILY MEDICINE

## 2020-07-04 ENCOUNTER — Ambulatory Visit (HOSPITAL_BASED_OUTPATIENT_CLINIC_OR_DEPARTMENT_OTHER): Payer: Medicare PPO

## 2020-07-04 ENCOUNTER — Encounter (HOSPITAL_BASED_OUTPATIENT_CLINIC_OR_DEPARTMENT_OTHER): Payer: Self-pay | Admitting: FAMILY MEDICINE

## 2020-07-04 ENCOUNTER — Encounter (HOSPITAL_BASED_OUTPATIENT_CLINIC_OR_DEPARTMENT_OTHER): Payer: Self-pay

## 2020-07-04 ENCOUNTER — Other Ambulatory Visit (HOSPITAL_BASED_OUTPATIENT_CLINIC_OR_DEPARTMENT_OTHER): Payer: Self-pay | Admitting: Family Medicine

## 2020-07-04 ENCOUNTER — Ambulatory Visit (HOSPITAL_BASED_OUTPATIENT_CLINIC_OR_DEPARTMENT_OTHER)
Admission: RE | Admit: 2020-07-04 | Discharge: 2020-07-04 | Disposition: A | Discharge status: Home / Self Care | Payer: Medicare PPO | Source: Ambulatory Visit

## 2020-07-04 VITALS — BP 142/68 | HR 55 | Temp 97.0°F | Wt 348.3 lb

## 2020-07-04 DIAGNOSIS — I4819 Other persistent atrial fibrillation: Secondary | ICD-10-CM

## 2020-07-04 DIAGNOSIS — Z23 Encounter for immunization: Secondary | ICD-10-CM

## 2020-07-04 DIAGNOSIS — M170 Bilateral primary osteoarthritis of knee: Secondary | ICD-10-CM

## 2020-07-04 DIAGNOSIS — M2341 Loose body in knee, right knee: Secondary | ICD-10-CM

## 2020-07-04 DIAGNOSIS — M1710 Unilateral primary osteoarthritis, unspecified knee: Secondary | ICD-10-CM

## 2020-07-04 DIAGNOSIS — M2342 Loose body in knee, left knee: Secondary | ICD-10-CM

## 2020-07-04 DIAGNOSIS — M179 Osteoarthritis of knee, unspecified: Secondary | ICD-10-CM

## 2020-07-04 NOTE — Patient Instructions (Signed)
VACCINE INFORMATION FACT SHEET FOR RECIPIENTS AND CAREGIVERS ABOUT COMIRNATY (COVID-19 VACCINE, mRNA) AND THE PFIZER-BIONTECH COVID-19 VACCINE TO PREVENT CORONAVIRUS DISEASE 2019 (COVID-19) FOR USE IN INDIVIDUALS 64 YEARS OF AGE AND OLDER   You are being offered either COMIRNATY (COVID-19 Vaccine, mRNA) or the Pfizer-BioNTech COVID-19 Vaccine to prevent Coronavirus Disease 2019 (COVID-19) caused by SARS-CoV-2.   This Vaccine Information Fact Sheet for Recipients and Caregivers comprises the Fact Sheet for the authorized Pfizer-BioNTech COVID-19 Vaccine and also includes information about the FDA-licensed vaccine, COMIRNATY (COVID-19 Vaccine, mRNA) for use in individuals 58 years of age and older.   The FDA-approved COMIRNATY (COVID-19 Vaccine, mRNA) and the Pfizer-BioNTech COVID-19 Vaccine authorized for Emergency Use Authorization (EUA) for individuals 61 years of age and older, when prepared according to their respective instructions for use, can be used interchangeably.1   COMIRNATY (COVID-19 Vaccine, mRNA) is an FDA-approved COVID-19 vaccine made by [employer] for [employer]. It is approved as a 2-dose series for prevention of COVID-19 in individuals 27 years of age and older. It is also authorized under EUA to provide:    a 2-dose primary series to individuals 12 through 64 years of age;  a third primary series dose to individuals 72 years of age and older who have been determined to have certain kinds of immunocompromise;    a single booster dose to individuals 12 years of age and older who have completed a primary series with Pfizer-BioNTech COVID-19 Vaccine or COMIRNATY (COVID-19 Vaccine, mRNA); and    a single booster dose to individuals 37 years of age and older who have completed primary vaccination with another authorized or approved COVID-19 vaccine. The booster schedule is based on the labeling information of the vaccine used for the primary series.   1 When prepared according to their respective  instructions for use, the FDA-approved COMIRNATY (COVID-19 Vaccine, mRNA) and the EUA-authorized Pfizer-BioNTech COVID-19 Vaccine for individuals 16 years of age and older can be used interchangeably without presenting any safety or effectiveness concerns.    The Pfizer-BioNTech COVID-19 Vaccine has received EUA from FDA to   provide:  a 2-dose primary series to individuals 63 years of age and older;  a third primary series dose to individuals 30 years of age and older who   have been determined to have certain kinds of immunocompromise;    a single booster dose to individuals 42 years of age and older who have completed a primary series with Pfizer-BioNTech COVID-19 Vaccine or COMIRNATY (COVID-19 Vaccine, mRNA); and    a single booster dose to individuals 16 years of age and older who have completed primary vaccination with another authorized or approved COVID-19 vaccine. The booster schedule is based on the labeling information of the vaccine used for the primary series.   This Vaccine Information Fact Sheet contains information to help you understand the risks and benefits of COMIRNATY (COVID-19 Vaccine, mRNA) and the Pfizer-BioNTech COVID-19 Vaccine, which you may receive because there is currently a pandemic of COVID-19. Talk to your vaccination provider if you have questions.   This Fact Sheet may have been updated. For the most recent Fact Sheet, please see www.TripMetro.hu.   WHAT YOU NEED TO KNOW BEFORE YOU GET THIS VACCINE   WHAT IS COVID-19?   COVID-19 disease is caused by a coronavirus called SARS-CoV-2. You can get COVID-19 through contact with another person who has the virus. It is predominantly a respiratory illness that can affect other organs. People with COVID-19 have had a wide range of  symptoms reported, ranging from mild symptoms to severe illness leading to death. Symptoms may appear 2 to 14 days after exposure to the virus. Symptoms may include: fever or chills; cough; shortness of  breath; fatigue; muscle or body aches; headache; new loss of taste or smell; sore throat; congestion or runny nose; nausea or vomiting; diarrhea.   WHAT IS COMIRNATY (COVID-19 VACCINE, mRNA) AND HOW IS IT RELATED TO THE PFIZER-BIONTECH COVID-19 VACCINE?   COMIRNATY (COVID-19 Vaccine, mRNA) and the Pfizer-BioNTech COVID-19 Vaccine, when prepared according to their respective instructions for use, can be used interchangeably.   For more information on EUA, see the What is an Emergency Use Authorization (EUA)? section at the end of this Fact Sheet.   WHAT SHOULD YOU MENTION TO YOUR VACCINATION PROVIDER BEFORE YOU GET THE VACCINE?   Tell the vaccination provider about all of your medical conditions, including if you:    have any allergies     have had myocarditis (inflammation of the heart muscle) or pericarditis   (inflammation of the lining outside the heart)     have a fever     have a bleeding disorder or are on a blood thinner     are immunocompromised or are on a medicine that affects your immune  system     are pregnant or plan to become pregnant     are breastfeeding     have received another COVID-19 vaccine     have ever fainted in association with an injection         HOW IS THE VACCINE GIVEN?   The Pfizer-BioNTech COVID-19 Vaccine or COMIRNATY (COVID-19 Vaccine, mRNA) will be given to you as an injection into the muscle.   Primary Series: The vaccine is administered as a 2-dose series, 3 weeks apart. A third primary series dose may be administered at least 4 weeks after the second dose to individuals who are determined to have certain kinds of immunocompromise.   Booster Dose:    A single booster dose of the vaccine may be administered at least 5 months after completion of a primary series of the Pfizer-BioNTech COVID-19 Vaccine or COMIRNATY (COVID-19 Vaccine, mRNA) to individuals 61 years of age and older.    A single booster dose of the vaccine may be administered to individuals 2 years of  age and older who have completed primary vaccination with another authorized or approved COVID-19 vaccine. Please check with your healthcare provider regarding timing of the booster dose.   The vaccine may not protect everyone.   WHO SHOULD NOT GET THE VACCINE?   You should not get the vaccine if you:     had a severe allergic reaction after a previous dose of this vaccine     had a severe allergic reaction to any ingredient of this vaccine.   WHAT ARE THE INGREDIENTS IN THE VACCINES?   COMIRNATY (COVID-19 Vaccine, mRNA) and the authorized formulations of the vaccine include the following ingredients:    mRNA and lipids ((4-hydroxybutyl)azanediyl)bis(hexane-6,1-diyl)bis(2hexyldecanoate), 2 [(polyethylene glycol)-2000]-N,N-ditetradecylacetamide, 1,2Distearoyl-sn-glycero-3-phosphocholine, and cholesterol).     Pfizer-BioNTech COVID-19 vaccines for individuals 39 years of age and older contain 1 of the following sets of additional ingredients; ask the vaccination provider which version is being administered:     potassium chloride, monobasic potassium phosphate, sodium  chloride, dibasic sodium phosphate dihydrate, and sucrose   OR  OR   tromethamine, tromethamine hydrochloride, and sucrose   COMIRNATY (COVID-19 Vaccine, mRNA) contains 1 of the following sets of additional ingredients;  ask the vaccination provider which version is being administered:     potassium chloride, monobasic potassium phosphate, sodium chloride,  dibasic sodium phosphate dihydrate, and sucrose   OR    tromethamine, tromethamine hydrochloride, and sucrose   HAS THE VACCINE BEEN USED BEFORE?   Yes. In clinical trials, approximately 23,000 individuals 84 years of age and older have received at least 1 dose of the vaccine. Data from these clinical trials supported the Emergency Use Authorization of the Pfizer-BioNTech COVID-19 Vaccines and the approval of COMIRNATY (COVID-19 Vaccine, mRNA). Millions of individuals have received the vaccine  under EUA since March 18, 2019. The vaccine that is authorized for use in individuals 97 years of age and older includes two formulations; one that was studied in clinical trials and used under EUA, and one with the same mRNA and lipids but different inactive ingredients. The use of the different inactive ingredients helps stabilize the vaccine under refrigerated temperatures and the formulation can be administered without dilution.   WHAT ARE THE BENEFITS OF THE VACCINE?   The vaccine has been shown to prevent COVID-19.   The duration of protection against COVID-19 is currently unknown.   WHAT ARE THE RISKS OF THE VACCINE?   There is a remote chance that the vaccine could cause a severe allergic reaction. A severe allergic reaction would usually occur within a few minutes to 1 hour after getting a dose of the vaccine. For this reason, your vaccination provider may ask you to stay at the place where you received your vaccine for monitoring after vaccination. Signs of a severe allergic reaction can include:    Difficulty breathing    Swelling of your face and throat    A fast heartbeat    A bad rash all over your body    Dizziness and weakness     Myocarditis (inflammation of the heart muscle) and pericarditis (inflammation of the lining outside the heart) have occurred in some people who have received the vaccine, more commonly in males under 32 years of age than among females and older males. In most of these people, symptoms began within a few days following receipt of the second dose of vaccine. The chance of having this occur is very low. You should seek medical attention right away if you have any of the following symptoms after receiving the vaccine:    Chest pain    Shortness of breath    Feelings of having a fast-beating, fluttering, or pounding heart   Side effects that have been reported with the vaccine include:    severe allergic reactions    non-severe allergic reactions such as rash,  itching, hives, or swelling of the face    myocarditis (inflammation of the heart muscle)    pericarditis (inflammation of the lining outside the heart)    injection site pain    tiredness    headache    muscle pain    chills    joint pain    fever    injection site swelling    injection site redness    nausea    feeling unwell    swollen lymph nodes (lymphadenopathy)    decreased appetite    diarrhea    vomiting    arm pain    fainting in association with injection of the vaccine   These may not be all the possible side effects of the vaccine. Serious and unexpected side effects may occur. The possible side effects of the vaccine are  still being studied in clinical trials.   WHAT SHOULD I DO ABOUT SIDE EFFECTS?   If you experience a severe allergic reaction, call 9-1-1, or go to the nearest hospital.   Call the vaccination provider or your healthcare provider if you have any side effects that bother you or do not go away.   Report vaccine side effects to FDA/CDC Vaccine Adverse Event Reporting System (VAERS). The VAERS toll-free number is 480-467-3973 or report online to [email or web address] Please include either COMIRNATY (COVID-19 Vaccine, mRNA) or Pfizer-BioNTech COVID-19 Vaccine EUA, as appropriate, in the first line of box #18 of the report form.     In addition, you can report side effects to [employer] at the contact information provided below.   Website  Fax number  Telephone number    www.pfizersafetyreporting.com  [phone]  [phone]      You may also be given an option to enroll in v-safe. V-safe is a new voluntary smartphone-based tool that uses text messaging and web surveys to check in with people who have been vaccinated to identify potential side effects after COVID-19 vaccination. V-safe asks questions that help CDC monitor the safety of COVID-19 vaccines. V-safe also provides second-dose reminders if needed and live telephone follow-up by CDC  if participants report a significant health impact following COVID-19 vaccination. For more information on how to sign up, visit: [email or web address].   WHAT IF I DECIDE NOT TO GET COMIRNATY (COVID-19 VACCINE, mRNA) OR THE PFIZER-BIONTECH COVID-19 VACCINE?   Under the EUA, it is your choice to receive or not receive the vaccine. Should you decide not to receive it, it will not change your standard medical care.   ARE OTHER CHOICES AVAILABLE FOR PREVENTING COVID-19 BESIDES COMIRNATY (COVID-19 VACCINE, mRNA) OR THE PFIZER-BIONTECH COVID-19 VACCINE?   Another choice for preventing COVID-19 is SPIKEVAX, an FDA-approved COVID-19 vaccine. Other vaccines to prevent COVID-19 may be available under Emergency Use Authorization.   CAN I RECEIVE THE COMIRNATY (COVID-19 VACCINE, mRNA) OR PFIZER-BIONTECH COVID-19 VACCINE AT THE SAME TIME AS OTHER VACCINES?   Data have not yet been submitted to FDA on administration of COMIRNATY (COVID-19 Vaccine, mRNA) or the Pfizer-BioNTech COVID-19 Vaccine at the same time with other vaccines. If you are considering receiving COMIRNATY (COVID-19 Vaccine, mRNA) or the Pfizer-BioNTech COVID-19 Vaccine with other vaccines, discuss your options with your healthcare provider.   WHAT IF I AM IMMUNOCOMPROMISED?   If you are immunocompromised, you may receive a third dose of the vaccine. The third dose may still not provide full immunity to COVID-19 in people who are immunocompromised, and you should continue to maintain physical precautions to help prevent COVID-19. In addition, your close contacts should be vaccinated as appropriate.   WHAT IF I AM PREGNANT OR BREASTFEEDING?   If you are pregnant or breastfeeding, discuss your options with your healthcare provider.   WILL THE VACCINE GIVE ME COVID-19?   No. The vaccine does not contain SARS-CoV-2 and cannot give you COVID-19.   KEEP YOUR VACCINATION CARD   When you get your first dose, you will get a vaccination card to show you when to return for your  next dose(s) of the vaccine. Remember to bring your card when you return.   ADDITIONAL INFORMATION   If you have questions, visit the website or call the telephone number provided below.   To access the most recent Fact Sheets, please scan the QR code provided below.       HOW CAN I LEARN MORE?  Ask the vaccination provider.    Visit CDC at BeginnerSteps.be.    Visit FDA at DirectoryExclusive.com.cy  legal-regulatory-and-policy-framework/emergency-use-authorization.    Contact your local or state public health department.   WHERE WILL MY VACCINATION INFORMATION BE RECORDED?   The vaccination provider may include your vaccination information in your state/local jurisdictions Immunization Information System (IIS) or other designated system. This will ensure that you receive the same vaccine when you return for the second dose. For more information about IISs visit: [email or web address].   CAN I BE CHARGED AN ADMINISTRATION FEE FOR RECEIPT OF THE COVID-19 VACCINE?   No. At this time, the provider cannot charge you for a vaccine dose and you cannot be charged an out-of-pocket vaccine administration fee or any other fee if only receiving a COVID-19 vaccination. However, vaccination providers may seek appropriate reimbursement from a program or plan that covers COVID-19 vaccine administration fees for the vaccine recipient (private insurance, Medicare, Medicaid, [place] [HRSA] COVID-19 Uninsured Program for non-insured recipients).   WHERE CAN I REPORT CASES OF SUSPECTED FRAUD?   Individuals becoming aware of any potential violations of the CDC COVID-19 Vaccination Program requirements are encouraged to report them to the Office of the [employer], U.S. Department of Health and [employer], at 1-800-HHS-TIPS or https://TIPS.HHS.GOV.   WHAT IS THE COUNTERMEASURES INJURY  COMPENSATION PROGRAM?   The Countermeasures Injury Compensation Program (CICP) is a federal program that may help pay for costs of medical care and other specific expenses of certain people who have been seriously injured by certain medicines or vaccines, including this vaccine. Generally, a claim must be submitted to the CICP within one (1) year from the date of receiving the vaccine. To learn more about this program, visit https://www.bennett.info/ or call (223)593-8899.   WHAT IS AN EMERGENCY USE AUTHORIZATION (EUA)?   An Emergency Use Authorization (EUA) is a mechanism to facilitate the availability and use of medical products, including vaccines, during public health emergencies, such as the current COVID-19 pandemic. An EUA is supported by a Risk manager (HHS) declaration that circumstances exist to justify the emergency use of drugs and biological products during the COVID-19 pandemic.   The FDA may issue an EUA when certain criteria are met, which includes that there are no adequate, approved, available alternatives. In addition, the FDA decision is based on the totality of scientific evidence available showing that the product may be effective to prevent COVID-19 during the COVID-19 pandemic and that the known and potential benefits of the product outweigh the known and potential risks of the product. All of these criteria must be met to allow for the product to be used in the treatment of patients during the COVID-19 pandemic    This EUA for the Pfizer-BioNTech COVID-19 Vaccine and COMIRNATY (COVID-19 Vaccine, mRNA) will end when the Secretary of HHS determines that the circumstances justifying the EUA no longer exist or when there is a change in the approval status of the product such that an EUA is no longer needed.    Manufactured by  [employer] [place] [address]    Manufactured for  [address]  [place], [place]   [id number]  Revised  May 07, 2020.

## 2020-07-04 NOTE — Progress Notes (Signed)
Department of Family Medicine   Progress Note    Alexandra Nunez  MRN: [id number]  DOB: 24-Jun-1955  Date of Service: 07/05/2020    CHIEF COMPLAINT  Chief Complaint   Patient presents with    Knee Pain     bilateral       SUBJECTIVE  Alexandra Nunez is a 65 y.o. female with PMH of HTN, GERD, persistent A.fib (on Eliquis), CKD, morbid obesity, RLS and chronic joint pain who presents to clinic to discuss her pain medication. Her PCP is Dr. Alton Revere. She takes Tramadol 50 mg daily and Cymbalta 30 mg daily for chronic knee pain likely secondary to osteoarthritis. Her BL knee pain has worsened recently. The left is worse than right with more "cruching". She has difficulty with mobilization due to the pain. Has tried PT in the past several years prior that aquatic therapy was beneficial. Also has history of multiple steroid injections in the past without relief. Patient is requesting opioid medication to substitute tramadol. Denies any fever or chills.  Of note, patient is having bariatric surgery on May 3rd.         PAST MEDICAL HISTORY  Past Medical History:   Diagnosis Date    Asthma     Atrial fibrillation (CMS HCC)     Borderline diabetes     Chronic joint pain     CKD (chronic kidney disease) stage 3, GFR 30-59 ml/min (CMS HCC)     Essential hypertension     GERD (gastroesophageal reflux disease)     Knee pain, bilateral     Obesity     Osteoarthritis     Sleep apnea     Uterine cancer (CMS [place]) 2016       Medications:   acetaminophen (TYLENOL ARTHRITIS PAIN) 650 mg Oral Tablet Sustained Release, Take 1,300 mg by mouth Twice daily  albuterol sulfate (PROVENTIL OR VENTOLIN OR PROAIR) 90 mcg/actuation Inhalation HFA Aerosol Inhaler, Take 2 Puffs by inhalation Every 4 hours as needed  albuterol sulfate (PROVENTIL) 2.5 mg /3 mL (0.083 %) Inhalation Solution for Nebulization, Take by nebulization Every 4 hours as needed for Wheezing  apixaban (ELIQUIS) 5 mg Oral Tablet, TAKE 1 TABLET BY MOUTH TWICE DAILY  budesonide-formoterol  (SYMBICORT) 160-4.5 mcg/actuation Inhalation HFA Aerosol Inhaler, Take 2 Puffs by inhalation Twice daily  dilTIAZem (CARDIZEM CD) 240 mg Oral Capsule, Sust. Release 24 hr, Take 1 Capsule (240 mg total) by mouth Once a day  dilTIAZem (CARDIZEM CD) 300 mg Oral Capsule, Sust. Release 24 hr, Take 1 Capsule (300 mg total) by mouth Once a day (Patient not taking: Reported on 07/04/2020)  doxycycline hyclate (VIBRAMYCIN) 100 mg Oral Capsule, Take 1 Capsule (100 mg total) by mouth Twice daily (Patient not taking: Reported on 07/04/2020)  DULoxetine (CYMBALTA DR) 30 mg Oral Capsule, Delayed Release(E.C.), Take 1 Capsule (30 mg total) by mouth Once a day (Patient not taking: Reported on 07/04/2020)  ELIQUIS 5 mg Oral Tablet, TK 1 T PO BID  ELIQUIS 5 mg Oral Tablet, TK 1 T PO BID  furosemide (LASIX) 40 mg Oral Tablet, Take 1.5 Tablets (60 mg total) by mouth Once a day Takes 1 1/2 tablet daily  hydrALAZINE (APRESOLINE) 25 mg Oral Tablet, Take 1 Tablet (25 mg total) by mouth Three times a day  losartan (COZAAR) 100 mg Oral Tablet, Take 1 Tablet (100 mg total) by mouth Once a day  metoprolol succinate (TOPROL-XL) 25 mg Oral Tablet Sustained Release 24 hr, Take 1 Tablet (25 mg total)  by mouth Once a day  rOPINIRole (REQUIP) 0.5 mg Oral Tablet, Take 1 Tablet (0.5 mg total) by mouth Every night (Patient not taking: Reported on 07/04/2020)  traMADoL (ULTRAM) 50 mg Oral Tablet, Take 1 Tablet (50 mg total) by mouth Every 6 hours as needed for Pain    No facility-administered medications prior to visit.      Allergies:   Allergies   Allergen Reactions    Chocolate [Cocoa]     Ciprofloxacin     Peanut Butter Flavor [Flavoring Agent]      Makes mouth numb    Prednisone      High doses  Other reaction(s): Hypertension (intolerance), Other (See Comments), Other (See Comments)  Increased BP  Raises her blood pressure      Protonix [Pantoprazole]     Sulfa (Sulfonamides)        Review of Systems:  All other ROS negative excluding  HPI    OBJECTIVE  BP (!) 142/68 (Site: Left, Patient Position: Sitting, Cuff Size: Adult Large)    Pulse 55    Temp 36.1 C (97 F) (Thermal Scan)    Wt (!) 158 kg (348 lb 5.2 oz) Comment: with shoes   SpO2 97%    BMI 59.76 kg/m     General: no distress; morbidly obese sitting in a wheelchair   HENT: Normocephalic and atraumatic    Lungs: clear to auscultation bilaterally  Cardiovascular: Irregularly irregular rhythm, no murmur  Abdomen: protuberant   Extremities: 2+ non-pitting edema BL lower extremities. Limited exam due to body habitus.   Skin: warm and dry  Neurologic: gait is normal, AOx3, CN 2-12 grossly intact  Psychiatric: normal affect and behavior    BASIC METABOLIC PANEL  Lab Results   Component Value Date    SODIUM 143 02/24/2020    POTASSIUM 3.8 02/24/2020    CHLORIDE 103 02/24/2020    CO2 30 02/24/2020    ANIONGAP 10 02/24/2020    BUN 12 02/24/2020    CREATININE 0.92 02/24/2020    BUNCRRATIO 13 02/24/2020    GFR 66 02/24/2020    CALCIUM 9.1 02/24/2020    GLUCOSENF 118 02/24/2020        Lab Results   Component Value Date    HA1C 5.7 02/16/2018     Imaging:  BL knee sports series radiographs read independently by provider shows severe joint space narrowing (varus deformity) and multiple osteophytes suggesting severe OA.    ASSESSMENT/PLAN  Alexandra Nunez is a 65 y.o. female with PMH of HTN, GERD, persistent A.fib (on Eliquis), CKD, morbid obesity, RLS and chronic joint pain who presents to clinic for BL knee pain.    (M17.10) OA (osteoarthritis) of knee  (primary encounter diagnosis)  (E66.01) Morbid obesity (CMS HCC)  XR knee's obtained show severe OA bilateral knees  Plan: Patient agreeable to physical therapy   - Recommend lifestyle modifications including exercise and diet changes  - Continue current pain medication regimen including tylenol, ice 15 min/day   - Continue to follow with Bariatric surgery     Persistent Atrial Fibrillation  - Patient successfully rate controlled and on anticoagulation  to prevent CVA  - Well controlled; continue current medication regiemen    COVID-19 booster vaccination was provided in clinic     Orders Placed This Encounter    CANCELED: XR KNEE SPORTS SERIES, LEFT    XR [place], RIGHT    Refer to Physical Therapy-EXTERNAL     Return in about 3 weeks (  around 07/25/2020).      Winferd Humphrey, MD 07/05/2020, 17:11    This note was partially generated using MModal Fluency Direct system. There may be some incorrect words, spellings, and punctuation as a result that were not noted in checking the note before saving.          Prior to the patient's discharge today, I saw and examined the patient. I agree with the resident's/fellow's/nurse practitioner's//trainer's assessment and plan. Any exceptions are noted. I have reviewed and discussed the prevention plan with the patient.    Mackie Pai, MD  Associate Professor  Director, Division of Sports Medicine  [facility]  Operated by [facility]

## 2020-07-19 ENCOUNTER — Encounter (HOSPITAL_BASED_OUTPATIENT_CLINIC_OR_DEPARTMENT_OTHER): Payer: Self-pay | Admitting: Family Medicine

## 2020-07-19 ENCOUNTER — Other Ambulatory Visit (HOSPITAL_BASED_OUTPATIENT_CLINIC_OR_DEPARTMENT_OTHER): Payer: Self-pay | Admitting: Family Medicine

## 2020-07-19 MED ORDER — TRAMADOL 50 MG TABLET
1.0000 | ORAL_TABLET | Freq: Four times a day (QID) | ORAL | 0 refills | Status: DC | PRN
Start: 2020-07-19 — End: 2020-11-05

## 2020-07-19 NOTE — Telephone Encounter (Signed)
Regarding: Dr Wynelle Fanny, refill medication  ----- Message from Marzetta Board sent at 07/19/2020  4:33 PM EDT -----  Hartley Barefoot, MD    Patient called for refill- the patient is out of the medication:    traMADoL (ULTRAM) 50 mg Oral Tablet 120 Tablet 0 05/16/2020    Sig - Route: Take 1 Tablet (50 mg total) by mouth Every 6 hours as needed for Pain - Oral   Sent to pharmacy as: traMADoL 50 mg tablet (ULTRAM)   Class: E-Rx   Non-formulary Exception Code: [id number] Formulary Info Available   E-Prescribing Status: Receipt confirmed by pharmacy (05/08/2020 4:34 PM EST)     Preferred Pharmacy     [id number] [address], [place].    [place] [address]    Phone: [phone] Fax: [phone]    Hours: Not open 24 hours     Thank you,  Marzetta Board

## 2020-07-23 ENCOUNTER — Encounter (HOSPITAL_BASED_OUTPATIENT_CLINIC_OR_DEPARTMENT_OTHER): Payer: Self-pay | Admitting: Family Medicine

## 2020-07-23 NOTE — Telephone Encounter (Signed)
Reviewed medication list for crushing/liquid formulation options.    Acetaminophen-should not crush Arthritis sustained release preparation but can use liquid or crush immediate release tablets  Apixaban-can crush  Diltiazem-will need to convert to IR, need to verify current dose as two strengths listed in Epic  Duloxetine-need to confirm if patient is taking, no recent fill history since October 2021  Furosemide, hydralazine, losartan, ropinirole, tramadol can all be crushed  Metoprolol will need to convert to tartrate 12.5 mg BID    Require additional clarification of: duloxetine-taking? And diltiazem dose. Message to patient to clarify.    Arrie Senate, PHARMD

## 2020-07-25 MED ORDER — METOPROLOL TARTRATE 25 MG TABLET
12.5000 mg | ORAL_TABLET | Freq: Two times a day (BID) | ORAL | 2 refills | Status: DC
Start: 2020-07-25 — End: 2021-07-15

## 2020-07-25 MED ORDER — DILTIAZEM 120 MG TABLET
120.0000 mg | ORAL_TABLET | Freq: Two times a day (BID) | ORAL | 2 refills | Status: DC
Start: 2020-07-25 — End: 2020-09-10

## 2020-07-31 ENCOUNTER — Other Ambulatory Visit: Payer: Self-pay

## 2020-08-05 HISTORY — PX: HX GASTRIC SLEEVE: 2100003104

## 2020-08-10 ENCOUNTER — Encounter (INDEPENDENT_AMBULATORY_CARE_PROVIDER_SITE_OTHER): Payer: Self-pay | Admitting: INTERNAL MEDICINE CARDIOVASCULAR DISEASE

## 2020-08-10 ENCOUNTER — Encounter (HOSPITAL_COMMUNITY): Payer: Self-pay

## 2020-08-10 ENCOUNTER — Telehealth (HOSPITAL_COMMUNITY): Payer: Self-pay | Admitting: Hospitalist

## 2020-08-10 NOTE — Telephone Encounter (Signed)
Called by clinical coordinator for transfer of patient to [facility] for cardioversion.  Patient recently underwent gastric sleeve with hiatal hernia repair on 08/07/2020 by Dr.Geva.  Spoke to Dr. Eugenio Hoes, cardiology on-call, who also correlated with Dr. Deatra Canter, patient's primary cardiologist regarding plan of care.  At this time would only perform rate control measures with amiodarone and hold off on tee due to recent procedure.  I personally spoke to hospitalist at [facility], Dr Kennith Center, regarding plan of care.  He stated if only rate control was going to be performed, patient did not need to be transferred.  Dr. Deatra Canter and Eugenio Hoes confirmed patient did not need to be transferred at this current time and rate control can be performed at [facility].  Notified clinical coordinator patient was not accepted.      Milus Glazier, MD  08/10/2020, 15:02

## 2020-08-10 NOTE — Progress Notes (Signed)
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

## 2020-08-13 ENCOUNTER — Telehealth (HOSPITAL_BASED_OUTPATIENT_CLINIC_OR_DEPARTMENT_OTHER): Payer: Self-pay | Admitting: Family Medicine

## 2020-08-13 NOTE — Nursing Note (Signed)
Transition of Care Contact  Discharge Date: 08/12/2020  Transition Facility Type--Hospital (Inpatient or Observation)  [place]): Completed or attempted contact indicated by Date/Time  Completed Contact: 08/13/2020  2:46 PM  Contact Method(s)-- Patient/Caregiver Telephone  Clinical Staff Name/Role who contacted--Marianna Cid   Transition Assessment  Discharge Summary obtained?--Yes  How are you recovering?--Improving  Discharge Meds obtained?--Yes  Medications reconcilled with Discharge Documentation?--Yes  Medication understanding --knows new medication(s)  Medication Concerns?--No  Have everything needed for recovery?--Yes  Care Coordination:   Patient has transition follow-up appointment date and time?--Yes  Primary Care Transition Visit planned?--Yes  Specialist Transition Visit planned?  Patient/caregiver plans to attend transition visit?--Yes  Primary Follow-up Barrier  Interventions provided --reinforced discharge instructions--follow-up appointment date/time reinforced--patient expresses understanding of follow-up plan  Home Health or DME ordered at discharge?  Clinician/Team notified?  Primary reason clinician notified?  Transition Note:   Medications: patient stated she is now taking Cardizem 60 mg every 6 hours. Metoprolol 25 mg daily, percocet every 6 hours PRN, omeprazole 40 mg daily, Singulair 10 mg daily. She stated she is not taking cozaar, lasix, hydralazine. She stated she thinks she needs the lasix because she having some swelling but was told to ask her PCP.     Patient reports doing okay. She stated she is sore. She stated she did have some issues with her Afib while admitted. She denied chest pain and SOB/ reports good intake. She reports last BM was before discharge. Scheduled PCP follow up sooner. Patient had no further questions at this time.     Lemar Livings, RN 08/13/2020                 Further information may be documented in relevant  telephone or outreach encounter.

## 2020-08-15 ENCOUNTER — Encounter (HOSPITAL_BASED_OUTPATIENT_CLINIC_OR_DEPARTMENT_OTHER): Payer: Self-pay | Admitting: Student in an Organized Health Care Education/Training Program

## 2020-08-17 ENCOUNTER — Telehealth (INDEPENDENT_AMBULATORY_CARE_PROVIDER_SITE_OTHER): Payer: Self-pay | Admitting: NURSE PRACTITIONER, FAMILY

## 2020-08-17 NOTE — Telephone Encounter (Signed)
Called and spoke with Dr Aleatha Borer at this time she states that she switched patient back to the extended release cardizem today because she was unable to remember to take the short acting 4 times a day. Dr just wanted to let us know that she changed the medications.  [facility], RN  08/17/2020, 13:32

## 2020-08-20 ENCOUNTER — Encounter (HOSPITAL_BASED_OUTPATIENT_CLINIC_OR_DEPARTMENT_OTHER): Payer: Self-pay | Admitting: Student in an Organized Health Care Education/Training Program

## 2020-08-22 ENCOUNTER — Encounter (HOSPITAL_BASED_OUTPATIENT_CLINIC_OR_DEPARTMENT_OTHER): Payer: Self-pay | Admitting: Family Medicine

## 2020-09-05 ENCOUNTER — Encounter (INDEPENDENT_AMBULATORY_CARE_PROVIDER_SITE_OTHER): Payer: Self-pay

## 2020-09-10 ENCOUNTER — Encounter (INDEPENDENT_AMBULATORY_CARE_PROVIDER_SITE_OTHER): Payer: Self-pay

## 2020-09-10 ENCOUNTER — Other Ambulatory Visit: Payer: Self-pay

## 2020-09-10 ENCOUNTER — Ambulatory Visit (INDEPENDENT_AMBULATORY_CARE_PROVIDER_SITE_OTHER): Payer: Medicare PPO | Admitting: NURSE PRACTITIONER, FAMILY

## 2020-09-10 VITALS — BP 142/68 | HR 52 | Ht 64.0 in | Wt 317.2 lb

## 2020-09-10 DIAGNOSIS — R609 Edema, unspecified: Secondary | ICD-10-CM

## 2020-09-10 DIAGNOSIS — I4819 Other persistent atrial fibrillation: Secondary | ICD-10-CM

## 2020-09-10 DIAGNOSIS — R001 Bradycardia, unspecified: Secondary | ICD-10-CM

## 2020-09-10 DIAGNOSIS — I10 Essential (primary) hypertension: Secondary | ICD-10-CM

## 2020-09-10 MED ORDER — DIGOXIN 125 MCG (0.125 MG) TABLET
0.1250 mg | ORAL_TABLET | Freq: Every day | ORAL | 1 refills | Status: DC
Start: 2020-09-10 — End: 2021-05-31

## 2020-09-10 MED ORDER — DIGOXIN 125 MCG (0.125 MG) TABLET
0.1250 mg | ORAL_TABLET | Freq: Every day | ORAL | 1 refills | Status: DC
Start: 2020-09-10 — End: 2020-09-10

## 2020-09-10 NOTE — Progress Notes (Signed)
[facility]  [address]  [place] [address]  Phone: [phone]  Fax: [phone]    Encounter Date: 09/10/2020    Patient ID:  Alexandra Nunez  RKY:[id number]    DOB: Mar 02, 1956  Age: 65 y.o. female    Subjective:     Chief Complaint   Patient presents with   . Hospital Follow Up     HPI     Ms. Saxby is a 65 year old female returning to [facility] for hospital follow up.  She has past medical history of atrial fibrillation, with multiple cardioversion attempts in the past. On 08/07/2020, she underwent a laparoscopic sleeve gastrectomy. On the evening of postop day 1 she went into AFib with RVR.  She was started on a Cardizem drip, and her medications were subsequently adjusted with metoprolol 25 mg twice daily, Cardizem 240 mg once daily and digoxin 250 mcg once daily. Today, she reports that her AFib has remained stable.  She states that her Lopressor was decreased to 12.5 mg twice daily.  She reports since having her gastric sleeve surgery, she has had nausea. She reports that she has been unable to intake a lot of fluids. She states that she has lower extremity edema, which has worsened over the last few weeks. Her Lasix was recently discontinued due to her decreasing fluid intake.    Current Outpatient Medications   Medication Sig   . acetaminophen (TYLENOL ARTHRITIS PAIN) 650 mg Oral Tablet Sustained Release Take 1,300 mg by mouth Twice daily   . albuterol sulfate (PROVENTIL OR VENTOLIN OR PROAIR) 90 mcg/actuation Inhalation HFA Aerosol Inhaler Take 2 Puffs by inhalation Every 4 hours as needed   . albuterol sulfate (PROVENTIL) 2.5 mg /3 mL (0.083 %) Inhalation Solution for Nebulization Take by nebulization Every 4 hours as needed for Wheezing   . apixaban (ELIQUIS) 5 mg Oral Tablet TAKE 1 TABLET BY MOUTH TWICE DAILY   . budesonide-formoterol (SYMBICORT) 160-4.5 mcg/actuation Inhalation HFA Aerosol Inhaler Take 2 Puffs by  inhalation Twice daily   . cephalexin (KEFLEX) 500 mg Oral Capsule Take 500 mg by mouth Four times a day   . citalopram (CELEXA) 10 mg Oral Tablet Take 10 mg by mouth Once a day   . digoxin (LANOXIN) 125 mcg (0.125 mg) Oral Tablet Take 1 Tablet (125 mcg total) by mouth Once a day   . dilTIAZem (CARDIZEM CD) 240 mg Oral Capsule, Sust. Release 24 hr Take 1 Capsule (240 mg total) by mouth Once a day   . doxycycline hyclate (VIBRAMYCIN) 100 mg Oral Capsule Take 1 Capsule (100 mg total) by mouth Twice daily (Patient not taking: No sig reported)   . DULoxetine (CYMBALTA DR) 30 mg Oral Capsule, Delayed Release(E.C.) Take 1 Capsule (30 mg total) by mouth Once a day (Patient not taking: No sig reported)   . ELIQUIS 5 mg Oral Tablet TK 1 T PO BID   . ELIQUIS 5 mg Oral Tablet TK 1 T PO BID   . furosemide (LASIX) 40 mg Oral Tablet Take 1.5 Tablets (60 mg total) by mouth Once a day Takes 1 1/2 tablet daily (Patient not taking: Reported on 09/10/2020)   . hydrALAZINE (APRESOLINE) 25 mg Oral Tablet Take 1 Tablet (25 mg total) by mouth Three times a day   . losartan (COZAAR) 100 mg Oral Tablet Take 1 Tablet (100 mg total) by mouth Once a day   . metoprolol tartrate (LOPRESSOR)  25 mg Oral Tablet Take 0.5 Tablets (12.5 mg total) by mouth Twice daily   . montelukast (SINGULAIR) 10 mg Oral Tablet Take 10 mg by mouth Every evening   . omeprazole (PRILOSEC) 40 mg Oral Capsule, Delayed Release(E.C.) Take 40 mg by mouth Once a day   . ondansetron (ZOFRAN ODT) 4 mg Oral Tablet, Rapid Dissolve Take 4 mg by mouth Every 8 hours as needed for Nausea/Vomiting   . rOPINIRole (REQUIP) 0.5 mg Oral Tablet Take 1 Tablet (0.5 mg total) by mouth Every night (Patient not taking: No sig reported)   . traMADoL (ULTRAM) 50 mg Oral Tablet Take 1 Tablet (50 mg total) by mouth Every 6 hours as needed for Pain (Patient not taking: Reported on 09/10/2020)     Allergies   Allergen Reactions   . Chocolate [Cocoa]    . Ciprofloxacin    . Peanut Butter Flavor  [Flavoring Agent]      Makes mouth numb   . Prednisone      High doses  Other reaction(s): Hypertension (intolerance), Other (See Comments), Other (See Comments)  Increased BP  Raises her blood pressure     . Protonix [Pantoprazole]    . Sulfa (Sulfonamides)      Past Medical History:   Diagnosis Date   . Asthma    . Atrial fibrillation (CMS HCC)    . Borderline diabetes    . Chronic joint pain    . CKD (chronic kidney disease) stage 3, GFR 30-59 ml/min (CMS HCC)    . Essential hypertension    . GERD (gastroesophageal reflux disease)    . Knee pain, bilateral    . Obesity    . Osteoarthritis    . Sleep apnea    . Uterine cancer (CMS [facility]) 2016         Past Surgical History:   Procedure Laterality Date   . CARDIOVERSION     . COLONOSCOPY     . HX APPENDECTOMY     . HX DILATION AND CURETTAGE     . HX GASTRIC SLEEVE  08/2020   . HX HYSTERECTOMY     . HX OOPHORECTOMY     . LAPAROSCOPIC TOTAL HYSTERECTOMY     . SINUS SURGERY  2004         Family Medical History:     Problem Relation (Age of Onset)    Coronary Artery Disease Mother, Father    Diabetes type II Father    HTN <20 y.o. Mother, Father          Social History     Tobacco Use   . Smoking status: Never Smoker   . Smokeless tobacco: Never Used   Vaping Use   . Vaping Use: Never used   Substance Use Topics   . Alcohol use: Never   . Drug use: Never       Review of Systems   Constitutional: Negative for activity change, chills, diaphoresis, fatigue and unexpected weight change.   Respiratory: Negative for apnea, cough, chest tightness, shortness of breath and wheezing.    Cardiovascular: Positive for leg swelling. Negative for chest pain and palpitations.   Gastrointestinal: Positive for nausea. Negative for blood in stool.   Genitourinary: Negative for hematuria.   Musculoskeletal: Negative for arthralgias and myalgias.   Skin: Negative for color change.   Neurological: Negative for dizziness, syncope, weakness and light-headedness.   Hematological: Does not  bruise/bleed easily.     Objective:   Vitals: BP Marland Kitchen)  142/68 (Site: Right, Patient Position: Sitting)   Pulse 52   Ht 1.626 m (5\' 4" )   Wt (!) 144 kg (317 lb 3.2 oz)   BMI 54.45 kg/m         Physical Exam  Vitals and nursing note reviewed.   Constitutional:       General: She is not in acute distress.  HENT:      Head: Normocephalic and atraumatic.   Eyes:      General: No scleral icterus.  Cardiovascular:      Rate and Rhythm: Regular rhythm. Bradycardia present.      Pulses: Normal pulses.      Heart sounds: Murmur (systolic grade 2/6) heard.     No friction rub. No gallop.   Pulmonary:      Effort: Pulmonary effort is normal. No respiratory distress.      Breath sounds: Normal breath sounds. No wheezing, rhonchi or rales.   Musculoskeletal:      Cervical back: Normal range of motion and neck supple.      Right lower leg: Edema (3+) present.      Left lower leg: Edema (3+) present.   Skin:     General: Skin is warm and dry.      Findings: No rash.   Neurological:      Mental Status: She is alert and oriented to person, place, and time.   Psychiatric:         Behavior: Behavior normal.         Assessment & Plan:     ENCOUNTER DIAGNOSES     ICD-10-CM   1. Persistent atrial fibrillation (CMS HCC)  I48.19   2. Essential hypertension  I10   3. Morbid obesity (CMS HCC)  E66.01   4. Edema  R60.9   5. Sinus bradycardia  R00.1     In summary, Ms. Lahmann is a pleasant 65 year old female returning to [facility] follow up.  She has past medical history of atrial fibrillation, with multiple cardioversion attempts in the past. On 08/07/2020, she underwent a laparoscopic sleeve gastrectomy. On the evening of postop day 1 she went to AFib with RVR.  She was started on a Cardizem drip, and her medications were subsequently adjusted with metoprolol 25 mg twice daily, Cardizem 240 mg once daily and digoxin 250 mcg once daily. An EKG completed in the office today showed sinus bradycardia with  rate of 58 bpm, and nonspecific T-wave abnormality.  She reports doing well other then nausea and fatigue after her gastric sleeve surgery.  She also reports moderate swelling in her lower extremities, but states that her fluid intake has been low at home.  At this time due to her bradycardia, we will decrease her digoxin to 125 mcg once daily. She agrees to monitor her heart rate at home, and will call if she has any intolerance to the adjustment of medication.  She will remain on Cardizem and Lopressor as prescribed.  She states that she is tolerating her Eliquis well. In regards to her swelling in her lower extremities, we discussed her options with diuretic therapy.  Since she has very low fluid intake at home, will defer diuretic treatment at this time.  She agrees to monitor her fluid intake at home, and we will plan to reassess at her follow-up appointment.  Will plan to have her follow-up in the office in 8 weeks, sooner if needed.  She agrees to call  our office with any questions or concerns.    Orders Placed This Encounter   . ECG WITH INTERPRETATION/REPORT (AMB ONLY)   . digoxin (LANOXIN) 125 mcg (0.125 mg) Oral Tablet     Patient seen independently with supervising physician not present, but available for consultation via telephone.    Return in about 8 weeks (around 11/05/2020), or if symptoms worsen or fail to improve.    [name], NP  09/10/2020, 16:38

## 2020-09-10 NOTE — Nursing Note (Signed)
Meds checked with patients list and confirmed correct pharmacy with patient for e-scribing.   Hoy Finlay, RN  09/10/2020, 15:43

## 2020-09-19 ENCOUNTER — Other Ambulatory Visit: Payer: Self-pay

## 2020-09-20 ENCOUNTER — Other Ambulatory Visit: Payer: Self-pay

## 2020-10-23 ENCOUNTER — Other Ambulatory Visit: Payer: Self-pay

## 2020-10-30 ENCOUNTER — Other Ambulatory Visit: Payer: Self-pay

## 2020-10-30 ENCOUNTER — Ambulatory Visit (INDEPENDENT_AMBULATORY_CARE_PROVIDER_SITE_OTHER): Payer: Self-pay | Admitting: Physician Assistant

## 2020-10-30 NOTE — Telephone Encounter (Signed)
States onset of rapid heart rates with skipped beats  Onset 2 weeks ago  Frequency Continuous  Duration Continuous  Associated symptoms Dizziness upon standing that last a few seconds without near syncope.    States chest discomfort  Location: Upper chest  Severity 1-10 scale: 6  Description Cramping  Radiates: Both arms and shoulders  Associated symptoms: Increased heart rate and belching  Onset: 2 weeks ago  Frequency Upon awakening  Duration 2-3 minutes  Last episode: 5 days ago    Home BP two days ago 150/82, did not record heart rate. Taking medications as prescribed except Lasix and Hydralazine. Advised to seek immediate medical attention for any unrelieved chest discomfort, dyspnea, and/or near syncope. She is inquiring if she may need cardioversion. Next visit [facility] Alexandra Nunez 11/05/20.      States unable to afford cost of Eliquis. Offered free 30 day supply card and patient financial assistance. She states she will stop at office tomorrow to pick them up.

## 2020-11-04 ENCOUNTER — Other Ambulatory Visit (HOSPITAL_BASED_OUTPATIENT_CLINIC_OR_DEPARTMENT_OTHER): Payer: Self-pay | Admitting: FAMILY PRACTICE

## 2020-11-05 ENCOUNTER — Ambulatory Visit (INDEPENDENT_AMBULATORY_CARE_PROVIDER_SITE_OTHER): Payer: Self-pay | Admitting: INTERNAL MEDICINE CARDIOVASCULAR DISEASE

## 2020-11-05 ENCOUNTER — Ambulatory Visit (INDEPENDENT_AMBULATORY_CARE_PROVIDER_SITE_OTHER): Payer: Medicare PPO | Admitting: Physician Assistant

## 2020-11-05 ENCOUNTER — Other Ambulatory Visit: Payer: Self-pay

## 2020-11-05 ENCOUNTER — Encounter (INDEPENDENT_AMBULATORY_CARE_PROVIDER_SITE_OTHER): Payer: Self-pay

## 2020-11-05 VITALS — BP 134/92 | HR 97 | Ht 64.0 in | Wt 302.4 lb

## 2020-11-05 DIAGNOSIS — I10 Essential (primary) hypertension: Secondary | ICD-10-CM

## 2020-11-05 DIAGNOSIS — I4819 Other persistent atrial fibrillation: Secondary | ICD-10-CM

## 2020-11-05 DIAGNOSIS — R609 Edema, unspecified: Secondary | ICD-10-CM

## 2020-11-05 DIAGNOSIS — R001 Bradycardia, unspecified: Secondary | ICD-10-CM

## 2020-11-05 NOTE — Nursing Note (Signed)
Pt did not have a med list with them at visit - went over list with patient   Advised patient to check AVS list with medicines at home and call if any discrepancies    -Confirmed correct pharmacy with patient for e-scribing   Hoy Finlay, RN  11/05/2020, 13:54

## 2020-11-05 NOTE — Telephone Encounter (Addendum)
[place], [place]  Call PCP and see if they can run it through a program to get it cheaper or samples through them!   Samuella Bruin, PA-C 11/02/2020, 09:56   [facility], Section of Cardiology            Previous Messages      ----- Message -----   From: Excell Seltzer, MA   Sent: 11/02/2020  8:21 AM EDT   To: Samuella Bruin, PA-C     What can we do until we receive and get her patient assistance application sent in?   ----- Message -----   From: Kathlynn Grate, Ambulatory Care Assistant   Sent: 11/02/2020  8:05 AM EDT   To: [id number] Triage      ----- Message from Samuella Bruin, PA-C sent at 11/02/2020  9:55 AM EDT -----  ----- Message from Kathlynn Grate, Ambulatory Care Assistant sent at 11/02/2020  8:05 AM EDT -----  Pt tried to use Eliquis free 30 day card and can not. Shed like a call back.  She has been off Eliquis for a few days now

## 2020-11-05 NOTE — Progress Notes (Signed)
[place], [place]  [address]  [place] [address]  Phone: [phone]  Fax: [phone]    Encounter Date: 11/05/2020    Patient ID:  Alexandra Nunez  [id number]    DOB: 1955/06/08  Age: 65 y.o. female    Subjective:     Chief Complaint   Patient presents with   . Follow Up   . Palpitations   . Dizzy   . Fatigue     HPI   Alexandra Nunez is a pleasant 65 year old lady returning to the [facility] for follow up. Alexandra Nunez has a past medical history of persistent atrial fibrillation with multiple cardioversion attempts in the past. On 08/07/2020, Alexandra Nunez underwent a laparoscopic sleeve gastrectomy. On the evening of postop day 1, Alexandra Nunez went into AFib with RVR. Alexandra Nunez was started on a Cardizem drip, and her medications were subsequently adjusted with metoprolol 25 mg twice daily, Cardizem 240 mg once daily and digoxin 250 mcg once daily. Her metoprolol was subsequently reduced to 12.5 mg twice daily. Today, Alexandra Nunez reports that her AFib has remained stable. Following gastric sleeve surgery, Alexandra Nunez had a lot of nausea requiring phenergan, but this has improved. Alexandra Nunez notices water will make her more nauseated. Alexandra Nunez has lost 31 lbs since her procedure, but Alexandra Nunez's down from 363 lbs to 301 lbs in the last 6 months-1 year. Alexandra Nunez has OSA and qualified for CPAP, but Alexandra Nunez admits Alexandra Nunez didn't pick up the machine due to concerns givne the recall on the other brand. Alexandra Nunez remains off of Lasix and denies any worsened edema. Alexandra Nunez's felt dizzy but denies dyspnea. Alexandra Nunez has occasional chest pain in the early morning that Alexandra Nunez attributes to being positional induced. Alexandra Nunez hasn't had any discomfort with exertional activity. Alexandra Nunez has no known CAD with low risk PET-CT MPS in July of 2021 (normal myocardial perfusion with no fixed or reversible perfusion defects and normal LV wall function with normal EF at rest and stress). 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    Current Outpatient Medications   Medication Sig   . acetaminophen (TYLENOL ARTHRITIS PAIN) 650 mg Oral Tablet Sustained Release Take 1,300 mg by mouth Twice daily   . albuterol sulfate (PROVENTIL OR VENTOLIN OR PROAIR) 90 mcg/actuation Inhalation HFA Aerosol Inhaler Take 2 Puffs by inhalation Every 4 hours as needed   . albuterol sulfate (PROVENTIL) 2.5 mg /3 mL (0.083 %) Inhalation Solution for Nebulization Take by nebulization Every 4 hours as needed for Wheezing   . apixaban (ELIQUIS) 5 mg Oral Tablet TAKE 1 TABLET BY MOUTH TWICE DAILY (Patient not taking: Reported on 11/05/2020)   . budesonide-formoterol (SYMBICORT) 160-4.5 mcg/actuation Inhalation HFA Aerosol Inhaler Take 2 Puffs by inhalation Twice daily   . citalopram (CELEXA) 10 mg Oral Tablet Take 10 mg by mouth Once a day   . digoxin (LANOXIN) 125 mcg (0.125 mg) Oral Tablet Take 1 Tablet (125 mcg total) by mouth Once a day   . dilTIAZem (CARDIZEM CD) 240 mg Oral Capsule, Sust. Release 24 hr Take 1 Capsule (240 mg total) by mouth Once a day   . hydrALAZINE (APRESOLINE) 25 mg Oral Tablet Take 1 Tablet (25 mg total) by mouth Three times a day   . losartan (COZAAR) 100 mg Oral Tablet Take 1 Tablet (100 mg total) by mouth Once a day   . metoprolol tartrate (LOPRESSOR) 25 mg Oral Tablet Take 0.5 Tablets (12.5 mg total)  by mouth Twice daily   . montelukast (SINGULAIR) 10 mg Oral Tablet Take 10 mg by mouth Every evening   . omeprazole (PRILOSEC) 40 mg Oral Capsule, Delayed Release(E.C.) Take 40 mg by mouth Once a day   . ondansetron (ZOFRAN ODT) 4 mg Oral Tablet, Rapid Dissolve Take 4 mg by mouth Every 8 hours as needed for Nausea/Vomiting     Allergies   Allergen Reactions   . Chocolate [Cocoa]    . Ciprofloxacin    . Peanut Butter Flavor [Flavoring Agent]      Makes mouth numb   . Prednisone      High doses  Other reaction(s): Hypertension (intolerance), Other (See Comments), Other (See Comments)  Increased BP  Raises her blood pressure     . Protonix  [Pantoprazole]    . Sulfa (Sulfonamides)      Past Medical History:   Diagnosis Date   . Asthma    . Atrial fibrillation (CMS HCC)    . Borderline diabetes    . Chronic joint pain    . CKD (chronic kidney disease) stage 3, GFR 30-59 ml/min (CMS HCC)    . Essential hypertension    . GERD (gastroesophageal reflux disease)    . Knee pain, bilateral    . Obesity    . Osteoarthritis    . Sleep apnea    . Uterine cancer (CMS [facility]) 2016         Past Surgical History:   Procedure Laterality Date   . CARDIOVERSION     . COLONOSCOPY     . HX APPENDECTOMY     . HX DILATION AND CURETTAGE     . HX GASTRIC SLEEVE  08/2020   . HX HYSTERECTOMY     . HX OOPHORECTOMY     . LAPAROSCOPIC TOTAL HYSTERECTOMY     . SINUS SURGERY  2004         Family Medical History:     Problem Relation (Age of Onset)    Coronary Artery Disease Mother, Father    Diabetes type II Father    HTN <20 y.o. Mother, Father          Social History     Tobacco Use   . Smoking status: Never Smoker   . Smokeless tobacco: Never Used   Vaping Use   . Vaping Use: Never used   Substance Use Topics   . Alcohol use: Never   . Drug use: Never       Review of Systems   Constitutional: Positive for fatigue. Negative for activity change, appetite change, chills, fever and unexpected weight change.   HENT: Negative for hearing loss.    Respiratory: Negative for apnea, cough, chest tightness, shortness of breath and wheezing.    Cardiovascular: Positive for palpitations. Negative for chest pain and leg swelling.   Gastrointestinal: Negative for abdominal pain and blood in stool.   Genitourinary: Negative for hematuria.   Musculoskeletal: Negative for arthralgias and myalgias.   Neurological: Positive for dizziness. Negative for tremors, seizures, syncope, facial asymmetry, speech difficulty, light-headedness and headaches.   Psychiatric/Behavioral: Negative for sleep disturbance.     Objective:   Vitals: BP (!) 134/92 (Site: Left, Patient Position: Sitting)   Pulse 97   Ht  1.626 m ('5\' 4"'$ )   Wt (!) 137 kg (302 lb 6.4 oz)   SpO2 95% Comment: ra  BMI 51.91 kg/m         Physical Exam  Vitals and nursing note  reviewed.   Constitutional:       Appearance: Alexandra Nunez is well-developed.   Neck:      Vascular: No JVD.   Cardiovascular:      Rate and Rhythm: Normal rate and regular rhythm.      Pulses: Normal pulses.      Heart sounds: S1 normal and S2 normal. Murmur heard.    Systolic murmur is present with a grade of 2/6.    No friction rub. No gallop.   Pulmonary:      Effort: Pulmonary effort is normal. No respiratory distress.      Breath sounds: Normal breath sounds. No wheezing or rales.   Chest:      Chest wall: No tenderness.   Abdominal:      General: Bowel sounds are normal.      Palpations: Abdomen is soft.      Tenderness: There is no abdominal tenderness.   Musculoskeletal:         General: No tenderness. Normal range of motion.      Cervical back: Normal range of motion and neck supple.      Right lower leg: Edema present.      Left lower leg: Edema present.   Neurological:      Mental Status: Alexandra Nunez is alert and oriented to person, place, and time.   Psychiatric:         Behavior: Behavior normal.       Last CBC  (Last result in the past 2 years)      WBC   HGB   HCT   MCV   Platelets      02/24/20 1543 7.3   14.1   41.8   89.5   277          Last BMP  (Last result in the past 2 years)      Na   K   Cl   CO2   BUN   Cr   Calcium   Glucose   Glucose-Fasting        02/24/20 1543 143   3.8   103   30   12   0.92   9.1   118             Lab Results   Component Value Date    TSH 1.301 02/24/2020     Last Lipid Panel  (Last result in the past 2 years)      Cholesterol   HDL   LDL   Direct LDL   Triglycerides      02/24/20 1543 164   38   101  Comment: <100 mg/dL, Optimal  100-129 mg/dL, Near/Above Optimal  130-159 mg/dL, Borderline High  160-189 mg/dL, High  >=190 mg/dL, Very high     125            Assessment & Plan:     ENCOUNTER DIAGNOSES     ICD-10-CM   1. Persistent atrial fibrillation  (CMS HCC)  I48.19   2. Sinus bradycardia  R00.1   3. Essential hypertension  I10   4. Morbid obesity (CMS HCC)  E66.01   5. Edema  R60.9     In summary, Alexandra Nunez is a pleasant 65 year old lady returning to [facility] for follow up. Alexandra Nunez has a past medical history of persistent atrial fibrillation with multiple cardioversion attempts in the past. On 08/07/2020, Alexandra Nunez underwent a laparoscopic sleeve gastrectomy. On the evening of postop  day 1, Alexandra Nunez went into AFib with RVR. Alexandra Nunez was started on a Cardizem drip, and her medications were subsequently adjusted with metoprolol 25 mg twice daily, Cardizem 240 mg once daily and digoxin 250 mcg once daily. Her metoprolol was subsequently reduced to 12.5 mg twice daily. 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 Alexandra Nunez has felt fatigued and had nausea following her gastric sleeve procedure. Due to bradycardia, her digoxin was reduced to 165mg once daily. Alexandra Nunez was continued on Cardizem '240mg'$  once daily and Lopressor 12.'5mg'$  twice daily as prescribed. Alexandra Nunez has been tolerating Eliquis, but Alexandra Nunez's in the donut hole. Alexandra Nunez has not completed patient assistance forms. Alexandra Nunez's been out of medicine for a few weeks. Alexandra Nunez agreed to complete the forms and get them back to our office ASAP. Will also reach out to PCP's office in regards to therapy in the interim.        No orders of the defined types were placed in this encounter.    Patient was seen independently with supervising physician available for consultation.    Return in about 3 months (around 02/05/2021), or if symptoms worsen or fail to improve.    ASamuella Bruin PA-C  11/05/2020, 14:50  [facility]

## 2020-11-06 ENCOUNTER — Other Ambulatory Visit (INDEPENDENT_AMBULATORY_CARE_PROVIDER_SITE_OTHER): Payer: Self-pay | Admitting: Physician Assistant

## 2020-12-31 ENCOUNTER — Other Ambulatory Visit: Payer: Self-pay

## 2020-12-31 MED ORDER — ELIQUIS 5 MG TABLET
ORAL_TABLET | ORAL | 3 refills | Status: AC
Start: 2020-12-31 — End: ?
  Filled 2020-12-31: qty 60, 30d supply, fill #0

## 2021-02-05 ENCOUNTER — Encounter (INDEPENDENT_AMBULATORY_CARE_PROVIDER_SITE_OTHER): Payer: Self-pay | Admitting: INTERNAL MEDICINE CARDIOVASCULAR DISEASE

## 2021-05-02 ENCOUNTER — Ambulatory Visit (INDEPENDENT_AMBULATORY_CARE_PROVIDER_SITE_OTHER): Payer: Self-pay | Admitting: Medical

## 2021-05-02 ENCOUNTER — Ambulatory Visit (INDEPENDENT_AMBULATORY_CARE_PROVIDER_SITE_OTHER): Payer: Medicare PPO

## 2021-05-02 ENCOUNTER — Other Ambulatory Visit: Payer: Self-pay

## 2021-05-02 DIAGNOSIS — I4819 Other persistent atrial fibrillation: Secondary | ICD-10-CM

## 2021-05-02 NOTE — Telephone Encounter (Addendum)
Pt notified with information above and verbalized understanding.   Patient will try to come for nurse visit today by 4pm. If she does not make it, she will be here in the morning.   Excell Seltzer, MA  05/02/2021, 15:16    O'Kernick, Mariane Duval, PA-C  Excell Seltzer, MA  Could she come in for a nurse visit with EKG? If she is in Afib with faster heart rates, we may be able to try adjusting her meds. If she feels like she may be having a heart attack, she should go to the ER. Also, if she comes in and is very symptomatic, we may end up sending her to the ER.   Ashlee O'Kernick, PA-C 05/02/2021, 13:11          Previous Messages    ----- Message -----   From: Excell Seltzer, MA   Sent: 05/02/2021  1:00 PM EST   To: Mariane Duval O'Kernick, PA-C     Do you want me to recommend she go to the ED?   ----- Message -----   From: Sherald Barge   Sent: 05/02/2021 11:49 AM EST   To: [id number] Triage      ----- Message from Greer Pickerel, PA-C sent at 05/02/2021  1:09 PM EST -----  ----- Message from Sherald Barge sent at 05/02/2021 11:49 AM EST -----  Tye [place] left message earlier that she needs appt for Afib.I made appt for Monday 05/06/21, left her a message and she called back and said she is having pain in arms and feels she needs in before this. We don't have any openings.

## 2021-05-03 NOTE — Nursing Note (Signed)
Patient was seen in office 05/03/21 for nurse visit to obtain EKG. Patient had complaints of being in a-fib, previous chest pain. She is no longer symptomatic. EKG reviewed by A. Ashok Cordia, she stated patient was tachy, but has episodes of brady in the past. She recommended increasing Metoprolol to 25mg  twice daily and keep follow up appt on 05/06/21 with repeat EKG. Patient notified, but does not feel comfortable increasing medication at this time, as her BP's and HR's drop significantly at times. She states she will take an extra if she notices her HR raising to an out of range number. She will also monitor BP's and HR's and bring readings with her to her f/u appt.   Excell Seltzer, MA  05/03/2021, 08:25

## 2021-05-06 ENCOUNTER — Ambulatory Visit (INDEPENDENT_AMBULATORY_CARE_PROVIDER_SITE_OTHER): Payer: Medicare PPO | Admitting: INTERNAL MEDICINE CARDIOVASCULAR DISEASE

## 2021-05-06 ENCOUNTER — Encounter (INDEPENDENT_AMBULATORY_CARE_PROVIDER_SITE_OTHER): Payer: Self-pay | Admitting: INTERNAL MEDICINE CARDIOVASCULAR DISEASE

## 2021-05-06 ENCOUNTER — Other Ambulatory Visit: Payer: Self-pay

## 2021-05-06 VITALS — BP 120/84 | HR 86 | Ht 64.0 in | Wt 264.0 lb

## 2021-05-06 DIAGNOSIS — N183 Chronic kidney disease, stage 3 unspecified (CMS HCC): Secondary | ICD-10-CM

## 2021-05-06 DIAGNOSIS — I10 Essential (primary) hypertension: Secondary | ICD-10-CM

## 2021-05-06 DIAGNOSIS — I4819 Other persistent atrial fibrillation: Secondary | ICD-10-CM

## 2021-05-06 DIAGNOSIS — F3342 Major depressive disorder, recurrent, in full remission: Secondary | ICD-10-CM

## 2021-05-06 NOTE — Nursing Note (Signed)
Pt did not have a med list with them at visit - went over list with patient   Advised patient to check AVS list with medicines at home and call if any discrepancies    -Confirmed correct pharmacy with patient for e-scribing   [facility], RN  05/06/2021, 14:36

## 2021-05-06 NOTE — Progress Notes (Signed)
[place]  [place]  [place] [address]  [facility]  Progress Note    Name: Alexandra Nunez MRN:  [id number]   Date: 05/06/2021 Age: 66 y.o.     Chief Complaint: Follow Up, Palpitations, Dizzy,  Pain (Bil arms when laying down ), and Fatigue    Progress Note  66 year old post gastric bypass she had developed ileus and along with it AFib with RVR currently on cardizem 240 and metoprolol 25 bid along with digoxin (loading ongoing) heart rates 110-130 currenly euvolemic a few months ago patient requested cardioversion however NOAC interrupted for surgery and this cant be done at [facility]. She is here for a follow up visit today. She has no known CAD with low risk PET-CT MPS in July of 2021 (normal myocardial perfusion with no fixed or reversible perfusion defects and normal LV wall function with normal EF at rest and stress). 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    The history is provided by the patient.   Palpitations   This is a recurrent problem. The problem occurs intermittently. The problem has been gradually worsening. Associated symptoms include malaise/fatigue. Pertinent negatives include no chest pain, diaphoresis, dizziness, near-syncope or shortness of breath.   Fatigue  This is a chronic problem. Pertinent negatives include no chest pain and no shortness of breath. Nothing aggravates the symptoms. Nothing relieves the symptoms.       Patient Active Problem List    Diagnosis   . Morbid obesity (CMS [place])   . History of cardioversion   . bilateral knee pain to both legs   . Atrial fibrillation with rapid ventricular response (CMS HCC)   . CKD (chronic kidney disease) stage 3, GFR 30-59 ml/min (CMS HCC)   . Atrial fibrillation (CMS HCC)   . Essential hypertension   . Osteoarthritis   . History of endometrial cancer     Past Medical History:   Diagnosis Date   . Asthma    .  Atrial fibrillation (CMS HCC)    . Borderline diabetes    . Chronic joint pain    . CKD (chronic kidney disease) stage 3, GFR 30-59 ml/min (CMS HCC)    . Essential hypertension    . GERD (gastroesophageal reflux disease)    . Knee pain, bilateral    . Obesity    . Osteoarthritis    . Sleep apnea    . Uterine cancer (CMS [facility]) 2016         Past Surgical History:   Procedure Laterality Date   . CARDIOVERSION     . COLONOSCOPY     . HX APPENDECTOMY     . HX DILATION AND CURETTAGE     . HX GASTRIC SLEEVE  08/2020   . HX HYSTERECTOMY     . HX OOPHORECTOMY     . LAPAROSCOPIC TOTAL HYSTERECTOMY     . SINUS SURGERY  2004         Current Outpatient Medications   Medication Sig   . acetaminophen (TYLENOL ARTHRITIS PAIN) 650 mg Oral Tablet Sustained Release Take 2 Tablets (1,300 mg total) by mouth Twice daily   . albuterol sulfate (PROVENTIL OR VENTOLIN OR PROAIR) 90 mcg/actuation Inhalation HFA Aerosol Inhaler Take 2 Puffs by inhalation Every 4 hours as needed   . albuterol sulfate (PROVENTIL) 2.5 mg /3 mL (0.083 %) Inhalation Solution for Nebulization Take by nebulization Every  4 hours as needed for Wheezing   . apixaban (ELIQUIS) 5 mg Oral Tablet Take 1 tablet by mouth twice daily   . budesonide-formoterol (SYMBICORT) 160-4.5 mcg/actuation Inhalation HFA Aerosol Inhaler Take 2 Puffs by inhalation Twice daily   . citalopram (CELEXA) 10 mg Oral Tablet Take 10 mg by mouth Once a day (Patient not taking: Reported on 05/06/2021)   . digoxin (LANOXIN) 125 mcg (0.125 mg) Oral Tablet Take 1 Tablet (125 mcg total) by mouth Once a day   . dilTIAZem (CARDIZEM CD) 240 mg Oral Capsule, Sust. Release 24 hr Take 1 Capsule (240 mg total) by mouth Once a day   . hydrALAZINE (APRESOLINE) 25 mg Oral Tablet Take 1 Tablet (25 mg total) by mouth Three times a day   . losartan (COZAAR) 100 mg Oral Tablet Take 1 Tablet (100 mg total) by mouth Once a day   . metoprolol tartrate (LOPRESSOR) 25 mg Oral Tablet Take 0.5 Tablets (12.5 mg total) by mouth  Twice daily   . montelukast (SINGULAIR) 10 mg Oral Tablet Take 1 Tablet (10 mg total) by mouth Every evening   . omeprazole (PRILOSEC) 40 mg Oral Capsule, Delayed Release(E.C.) Take 1 Capsule (40 mg total) by mouth Once a day   . ondansetron (ZOFRAN ODT) 4 mg Oral Tablet, Rapid Dissolve Take 4 mg by mouth Every 8 hours as needed for Nausea/Vomiting (Patient not taking: Reported on 05/06/2021)     Allergies   Allergen Reactions   . Chocolate [Cocoa]    . Ciprofloxacin    . Peanut Butter Flavor [Flavoring Agent]      Makes mouth numb   . Prednisone      High doses  Other reaction(s): Hypertension (intolerance), Other (See Comments), Other (See Comments)  Increased BP  Raises her blood pressure     . Protonix [Pantoprazole]    . Sulfa (Sulfonamides)      Family Medical History:     Problem Relation (Age of Onset)    Coronary Artery Disease Mother, Father    Diabetes type II Father    HTN <20 y.o. Mother, Father          Social History     Tobacco Use   . Smoking status: Never   . Smokeless tobacco: Never   Substance Use Topics   . Alcohol use: Never      Review of Systems  Review of Systems   Constitutional: Positive for malaise/fatigue. Negative for diaphoresis and fatigue.   Respiratory: Negative for chest tightness and shortness of breath.    Cardiovascular: Positive for palpitations. Negative for chest pain, leg swelling and near-syncope.   Gastrointestinal: Negative for blood in stool.   Genitourinary: Negative for hematuria.   Neurological: Negative for dizziness and syncope.   Hematological: Does not bruise/bleed easily.   All other systems reviewed and are negative.    Examination:  BP 120/84 (Site: Left, Patient Position: Sitting)   Pulse 86   Ht 1.626 m (5\' 4" )   Wt 120 kg (264 lb)   SpO2 97%   BMI 45.32 kg/m       Physical Exam  Vitals and nursing note reviewed.   Constitutional:       Appearance: She is well-developed.   HENT:      Head: Normocephalic.   Eyes:      Pupils: Pupils are equal, round, and  reactive to light.   Neck:      Vascular: No JVD.   Cardiovascular:  Rate and Rhythm: Normal rate. Rhythm irregular.      Heart sounds: Normal heart sounds. No murmur heard.  Pulmonary:      Effort: Pulmonary effort is normal.      Breath sounds: Normal breath sounds.   Abdominal:      General: Bowel sounds are normal.      Palpations: Abdomen is soft.   Musculoskeletal:         General: No deformity. Normal range of motion.      Cervical back: Normal range of motion.   Skin:     General: Skin is warm and dry.   Neurological:      Mental Status: She is alert and oriented to person, place, and time.      Cranial Nerves: No cranial nerve deficit.         Labs:  Lab Results   Component Value Date    CHOLESTEROL 164 02/24/2020    HDLCHOL 38 (L) 02/24/2020    LDLCHOL 101 (H) 02/24/2020    TRIG 125 02/24/2020     CBC Results   No results for input(s): WBC, HGB, HCT, PLTCNT, BANDS in the last 72 hours.   BMP Results   No results for input(s): SODIUM, POTASSIUM, CHLORIDE, CO2, BUN, CREATININE, GFR, ANIONGAP in the last 72 hours.  No results for input(s): CALCIUM, ALBUMIN, MAGNESIUM in the last 72 hours.   Coag Results     No results for input(s): INR, PROTHROMTME, APTT in the last 72 hours.    Invalid input(s): PTT, PT, CREACTPROTIE   Cardiac Results        No results for input(s): UHCEASTTROPI, CKMB, MBINDEX, BNP in the last 72 hours.     Lab Results   Component Value Date    SODIUM 143 02/24/2020    POTASSIUM 3.8 02/24/2020    CHLORIDE 103 02/24/2020    CO2 30 02/24/2020    ANIONGAP 10 02/24/2020    BUN 12 02/24/2020    CREATININE 0.92 02/24/2020    BUNCRRATIO 13 02/24/2020    GFR 66 02/24/2020    GLUCOSENF 118 02/24/2020     Lab Results   Component Value Date    CHOLESTEROL 164 02/24/2020    HDLCHOL 38 (L) 02/24/2020    LDLCHOL 101 (H) 02/24/2020    TRIG 125 02/24/2020      HEMOGLOBIN A1C   Date Value Ref Range Status   02/16/2018 5.7 4.3 - 6.1 % Final     Hemogram   Lab Results   Component Value Date/Time    WBC 7.3  02/24/2020 03:43 PM    HGB 14.1 02/24/2020 03:43 PM    HCT 41.8 02/24/2020 03:43 PM    PLTCNT 277 02/24/2020 03:43 PM    RBC 4.67 02/24/2020 03:43 PM    MCV 89.5 02/24/2020 03:43 PM    MCHC 33.7 02/24/2020 03:43 PM    MCH 30.2 02/24/2020 03:43 PM    RDW 13.3 08/11/2019 02:24 PM    MPV 9.5 02/24/2020 03:43 PM          ECHO  .LASTIMG[[id number]      Cath  No results found for this or any previous visit.       Assessment and Plan  Problem List Items Addressed This Visit        Cardiovascular System    Atrial fibrillation (CMS [facility])    Essential hypertension - Primary       Nephrology    CKD (chronic kidney disease) stage 3, GFR  30-59 ml/min (CMS HCC)       Other    Morbid obesity (CMS [place])   Other Visit Diagnoses     Major depressive disorder, recurrent, in full remission (CMS HCC)                 No orders of the defined types were placed in this encounter.       I have seen and personally examined this patient. I have personally reviewed, and independently interpreted, all available imaging and clinical information. I then personally formulated and directed the detailed management plan as documented above inclusive of my edits.     My substantive findings and recommendations are:  1. Atrial Fibrillation / Cardioversion   Discussed rate control measures with the patient, patient would like to proceed with cardioversion.  With discussed risks and benefits explained TEE cardioversion.  Complications include sedation complications, issues related to probe placement, failure of cardioversion, unmasking underlying bradycardia, and stroke.  Patient verbalized understanding and agreed to proceed with this.  2. Atrial Fibrillation  Patient is AFib with [place] score as noted above, patient on stroke prevention measures, patient is aware of alternatives including Coumadin and NOACS and Watchman device.  For preoperative purposes patient is aware of slight increase in stroke risk. Stop antigoagulant as per manufacturer  reccomendations to hold at least two half lives prior to procedure and resume when cleared by physician doing the procedure.  CHADS2 Stroke Risk  Current as of today      2.8% 3 - 100%: High Risk   2 - 3%: Medium Risk   0 - 2%: Low Risk     Last Change: N/A        This score determines the patient's risk of having a stroke if the patient has atrial fibrillation.          Points Metrics   0 Has Congestive Heart Failure:  No     Patients with congestive heart failure get 1 point.    Current as of today   1 Has Hypertension:  Yes     Patients with hypertension get 1 point.    Current as of today   0 Age:  89     Patients who are 36 years of age or older get 1 point.    Current as of today   0 Has Diabetes:  No     Patients with diabetes get 1 point.    Current as of today   0 Had Stroke:  No  Had TIA:  No  Had Thromboembolism:  No     Patients who have had a stroke, TIA, or thromboembolism get 2 points.    Current as of today            05/06/21 1527   Medications   May patient stay on anticoagulant and antiplatelet medications for the procedure (if applicable)? No  (do not interrupt for cardioversion)   Is neuraxial (Spinal/Intrathecal/Epidural) anesthesia planned for this procedure?   To Be Determined   Duration to Hold   Anticoagulant drug #1 to hold: apixaban (Eliquis)   Earliest anticipated restart time for ANTICOAGULANT #1 Surgeon/proceduralist to confirm restarting plan post procedure. usually 24-48 hours   Duration to hold anticoagulant: hold x2 days         3. Atypical musculoskeletal chest pain    As part of this service, I spent 20 minutes on the clinical encounter, in independent patient care and counseling without APP overlap. This  substantive direct/indirect care included initial evaluation, review of laboratory, radiology, diagnostic/imaging studies, review of medical record, order entry and coordination of care with other providers.     Please feel free to contact me should you have any questions.      Thank you for your referral to the [facility].    Sincerely,        Leo Grosser, MD

## 2021-05-07 NOTE — Addendum Note (Signed)
Addended by: West Pugh A on: 05/07/2021 10:40 AM     Modules accepted: Orders

## 2021-05-10 ENCOUNTER — Ambulatory Visit (INDEPENDENT_AMBULATORY_CARE_PROVIDER_SITE_OTHER): Payer: Self-pay | Admitting: INTERNAL MEDICINE CARDIOVASCULAR DISEASE

## 2021-05-10 NOTE — Telephone Encounter (Signed)
Patient is scheduled for Cardioversion at [facility] with Dr. Deatra Canter on 05/16/21 patient will be notified the evening before when to arrive on the 9th. Patient is scheduled for pre admission on 05/13/21 @ 1:45 PM patient was instructed to arrive to [facility] registration at 1:15 PM. Patient verbalized understanding no questions or concerns at this time.    [place], [place]  05/10/2021, 15:35

## 2021-05-16 ENCOUNTER — Other Ambulatory Visit (INDEPENDENT_AMBULATORY_CARE_PROVIDER_SITE_OTHER): Payer: Self-pay | Admitting: INTERNAL MEDICINE CARDIOVASCULAR DISEASE

## 2021-05-16 ENCOUNTER — Other Ambulatory Visit (INDEPENDENT_AMBULATORY_CARE_PROVIDER_SITE_OTHER): Payer: Self-pay

## 2021-05-16 DIAGNOSIS — I4819 Other persistent atrial fibrillation: Secondary | ICD-10-CM

## 2021-05-16 DIAGNOSIS — I4891 Unspecified atrial fibrillation: Secondary | ICD-10-CM

## 2021-05-16 DIAGNOSIS — R42 Dizziness and giddiness: Secondary | ICD-10-CM

## 2021-05-16 DIAGNOSIS — R0600 Dyspnea, unspecified: Secondary | ICD-10-CM

## 2021-05-20 DIAGNOSIS — I4891 Unspecified atrial fibrillation: Secondary | ICD-10-CM

## 2021-05-25 DIAGNOSIS — R9431 Abnormal electrocardiogram [ECG] [EKG]: Secondary | ICD-10-CM

## 2021-05-25 DIAGNOSIS — I4891 Unspecified atrial fibrillation: Secondary | ICD-10-CM

## 2021-05-28 ENCOUNTER — Telehealth (INDEPENDENT_AMBULATORY_CARE_PROVIDER_SITE_OTHER): Payer: Self-pay | Admitting: INTERNAL MEDICINE CARDIOVASCULAR DISEASE

## 2021-05-28 NOTE — Telephone Encounter (Signed)
-----   Message from Leo Grosser, MD sent at 05/28/2021 12:10 PM EST -----  Regarding: MR  Discussed TEE images with Dr Etheleen Sia  If she remains in NSR  Repeat echo in 1 -2 months to assess MR  If it remains moderate to severe then refer to him   Of if Afib recurs  Please inform patient

## 2021-05-28 NOTE — Telephone Encounter (Signed)
Phoned patient LM with call back number.    [place], [place]  05/28/2021, 15:37

## 2021-05-29 ENCOUNTER — Other Ambulatory Visit (HOSPITAL_BASED_OUTPATIENT_CLINIC_OR_DEPARTMENT_OTHER): Payer: Self-pay | Admitting: Family Medicine

## 2021-05-31 ENCOUNTER — Other Ambulatory Visit (HOSPITAL_BASED_OUTPATIENT_CLINIC_OR_DEPARTMENT_OTHER): Payer: Self-pay | Admitting: Family Medicine

## 2021-05-31 ENCOUNTER — Other Ambulatory Visit (INDEPENDENT_AMBULATORY_CARE_PROVIDER_SITE_OTHER): Payer: Self-pay | Admitting: NURSE PRACTITIONER, FAMILY

## 2021-05-31 ENCOUNTER — Other Ambulatory Visit (INDEPENDENT_AMBULATORY_CARE_PROVIDER_SITE_OTHER): Payer: Self-pay | Admitting: INTERNAL MEDICINE CARDIOVASCULAR DISEASE

## 2021-05-31 MED ORDER — FUROSEMIDE 40 MG TABLET
40.0000 mg | ORAL_TABLET | Freq: Every day | ORAL | 3 refills | Status: AC
Start: 2021-05-31 — End: ?

## 2021-07-15 ENCOUNTER — Encounter (INDEPENDENT_AMBULATORY_CARE_PROVIDER_SITE_OTHER): Payer: Self-pay

## 2021-07-15 ENCOUNTER — Ambulatory Visit (INDEPENDENT_AMBULATORY_CARE_PROVIDER_SITE_OTHER): Payer: Medicare PPO | Admitting: Medical

## 2021-07-15 ENCOUNTER — Other Ambulatory Visit: Payer: Self-pay

## 2021-07-15 VITALS — BP 114/70 | HR 64 | Ht 64.0 in | Wt 260.0 lb

## 2021-07-15 DIAGNOSIS — I340 Nonrheumatic mitral (valve) insufficiency: Secondary | ICD-10-CM

## 2021-07-15 DIAGNOSIS — I10 Essential (primary) hypertension: Secondary | ICD-10-CM

## 2021-07-15 DIAGNOSIS — I4819 Other persistent atrial fibrillation: Secondary | ICD-10-CM

## 2021-07-15 MED ORDER — METOPROLOL TARTRATE 25 MG TABLET
25.0000 mg | ORAL_TABLET | Freq: Two times a day (BID) | ORAL | 0 refills | Status: AC
Start: 2021-07-15 — End: ?

## 2021-07-15 NOTE — Patient Instructions (Signed)
[email or web address]

## 2021-07-15 NOTE — Nursing Note (Signed)
Pt did not have a med list with them at visit - went over list with patient   Advised patient to check AVS list with medicines at home and call if any discrepancies    -Confirmed correct pharmacy with patient for e-scribing   [facility], RN  07/15/2021, 13:08

## 2021-07-15 NOTE — Progress Notes (Signed)
[place], [place]  [address]  [place] [address]  Phone: [phone]  Fax: [phone]    Encounter Date: 07/15/2021    Patient ID:  Alexandra Nunez  FMB:[id number]    DOB: Sep 22, 1955  Age: 66 y.o. female    Subjective:     Chief Complaint   Patient presents with   . Hospital Follow Up   . Palpitations     HPI   Alexandra Nunez is a pleasant 66 y.o. female presenting for follow up. She has a past medical history of persistent atrial fibrillation with multiple cardioversion attempts in the past. On 08/07/2020, she underwent a laparoscopic sleeve gastrectomy. She has OSA and qualified for CPAP, but she didn't pick up the machine due to concerns givne the recall on the other brand. She has no known CAD with low risk PET-CT MPS in July of 2021 (normal myocardial perfusion with no fixed or reversible perfusion defects and normal LV wall function with normal EF at rest and stress). 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 TEE in February 2023 showed EF 60-65% with moderate mitral regurgitation. Findings were discussed with Dr. Etheleen Sia who recommended a repeat echo in 1-2 months to reassess her mitral regurgitation if she remained in sinus rhythm. If Afib recurred or mitral regurgitation remained moderate to severe, he recommended referral to him. She was recently admitted to [facility] 07/02/2021 through 07/03/2021 with atrial fibrillation with RVR. She was started on a Cardizem drip. Her Lopressor was increased to 25 mg twice daily, and she was weaned off Cardizem drip. Due to hypotension, her hydralazine was held. She remained in atrial fibrillation with well-controlled rates. Today, she reports that she has been having palpitations since discharge, but her heart rate has stayed under 90bpm. She denies chest pain, dyspnea, pre-syncope and syncope. No TIA or stroke-like symptoms.       Current Outpatient Medications   Medication Sig   .  acetaminophen (TYLENOL ARTHRITIS PAIN) 650 mg Oral Tablet Sustained Release Take 2 Tablets (1,300 mg total) by mouth Twice daily   . albuterol sulfate (PROVENTIL OR VENTOLIN OR PROAIR) 90 mcg/actuation Inhalation HFA Aerosol Inhaler Take 2 Puffs by inhalation Every 4 hours as needed   . albuterol sulfate (PROVENTIL) 2.5 mg /3 mL (0.083 %) Inhalation Solution for Nebulization Take by nebulization Every 4 hours as needed for Wheezing   . apixaban (ELIQUIS) 5 mg Oral Tablet Take 1 tablet by mouth twice daily   . budesonide-formoterol (SYMBICORT) 160-4.5 mcg/actuation Inhalation HFA Aerosol Inhaler Take 2 Puffs by inhalation Twice daily   . citalopram (CELEXA) 10 mg Oral Tablet Take 10 mg by mouth Once a day (Patient not taking: Reported on 05/06/2021)   . digoxin (LANOXIN) 125 mcg (0.125 mg) Oral Tablet TAKE 1 TABLET (125 MCG TOTAL) BY MOUTH ONCE A DAY   . dilTIAZem (CARDIZEM CD) 240 mg Oral Capsule, Sust. Release 24 hr Take 1 Capsule (240 mg total) by mouth Once a day   . furosemide (LASIX) 40 mg Oral Tablet Take 1 Tablet (40 mg total) by mouth Once a day   . hydrALAZINE (APRESOLINE) 25 mg Oral Tablet Take 1 Tablet (25 mg total) by mouth Three times a day (Patient taking differently: Take 1 Tablet (25 mg total) by mouth Three times a day taking as needed)   . losartan (COZAAR) 100 mg Oral Tablet Take 1 Tablet (100 mg total) by mouth Once a day   .  metoprolol tartrate (LOPRESSOR) 25 mg Oral Tablet Take 1 Tablet (25 mg total) by mouth Twice daily   . montelukast (SINGULAIR) 10 mg Oral Tablet Take 1 Tablet (10 mg total) by mouth Every evening   . omeprazole (PRILOSEC) 40 mg Oral Capsule, Delayed Release(E.C.) Take 1 Capsule (40 mg total) by mouth Once a day   . ondansetron (ZOFRAN ODT) 4 mg Oral Tablet, Rapid Dissolve Take 4 mg by mouth Every 8 hours as needed for Nausea/Vomiting (Patient not taking: Reported on 05/06/2021)     Allergies   Allergen Reactions   . Chocolate [Cocoa]    . Ciprofloxacin    . Peanut Butter  Flavor [Flavoring Agent]      Makes mouth numb   . Prednisone      High doses  Other reaction(s): Hypertension (intolerance), Other (See Comments), Other (See Comments)  Increased BP  Raises her blood pressure     . Protonix [Pantoprazole]    . Sulfa (Sulfonamides)      Past Medical History:   Diagnosis Date   . Asthma    . Atrial fibrillation (CMS HCC)    . Borderline diabetes    . Chronic joint pain    . CKD (chronic kidney disease) stage 3, GFR 30-59 ml/min (CMS HCC)    . Essential hypertension    . GERD (gastroesophageal reflux disease)    . Knee pain, bilateral    . Obesity    . Osteoarthritis    . Sleep apnea    . Uterine cancer (CMS [facility]) 2016         Past Surgical History:   Procedure Laterality Date   . CARDIOVERSION     . COLONOSCOPY     . HX APPENDECTOMY     . HX DILATION AND CURETTAGE     . HX GASTRIC SLEEVE  08/2020   . HX HYSTERECTOMY     . HX OOPHORECTOMY     . LAPAROSCOPIC TOTAL HYSTERECTOMY     . SINUS SURGERY  2004         Family Medical History:     Problem Relation (Age of Onset)    Coronary Artery Disease Mother, Father    Diabetes type II Father    HTN <20 y.o. Mother, Father          Social History     Tobacco Use   . Smoking status: Never   . Smokeless tobacco: Never   Vaping Use   . Vaping Use: Never used   Substance Use Topics   . Alcohol use: Never   . Drug use: Never       Review of Systems   All other systems reviewed and are negative.    Objective:   Vitals: BP 114/70 (Site: Left, Patient Position: Sitting)   Pulse 64   Ht 1.626 m ('5\' 4"'$ )   Wt 118 kg (260 lb)   SpO2 96%   BMI 44.63 kg/m         Physical Exam  Vitals and nursing note reviewed.   Constitutional:       General: She is not in acute distress.     Appearance: She is not ill-appearing or diaphoretic.   HENT:      Head: Normocephalic and atraumatic.   Cardiovascular:      Rate and Rhythm: Normal rate. Rhythm irregularly irregular.      Heart sounds: Murmur heard.    Systolic murmur is present with a grade of  2/6.  Pulmonary:      Effort: Pulmonary effort is normal. No respiratory distress.      Breath sounds: Normal breath sounds. No wheezing or rales.   Musculoskeletal:      Cervical back: Normal range of motion and neck supple.   Skin:     General: Skin is warm and dry.   Neurological:      Mental Status: She is alert and oriented to person, place, and time.   Psychiatric:         Behavior: Behavior normal.         Assessment & Plan:     ENCOUNTER DIAGNOSES     ICD-10-CM   1. Persistent atrial fibrillation (CMS HCC)  I48.19   2. Mitral regurgitation  I34.0   3. Essential hypertension  I10     In summary, Ms. Ranganathan is a pleasant 66 year old lady with persistent atrial fibrillation. TEE in February 2023 showed EF 60-65% with moderate mitral regurgitation. Findings were discussed with Dr. Etheleen Sia who recommended a repeat echo in 1-2 months to reassess her mitral regurgitation if she remained in sinus rhythm. If Afib recurred or mitral regurgitation remained moderate to severe, he recommended referral to him. She was recently admitted to [facility] 07/02/2021 through 07/03/2021 with atrial fibrillation with RVR. She was started on a Cardizem drip. Her Lopressor was increased to 25 mg twice daily, and she was weaned off Cardizem drip. Due to hypotension, her hydralazine was held. She remained in atrial fibrillation with well-controlled rates. Today, she reports that she has been having palpitations since discharge, but her heart rate has stayed under 90bpm. She denies chest pain, dyspnea, pre-syncope and syncope. No TIA or stroke-like symptoms. We discussed referral to CT surgery for evaluation for Cox-Maze procedure and mitral valve repair. She declines at this time and states that she will think about it. She agrees to call our office if she decides to proceed with referral.     Orders Placed This Encounter   . metoprolol tartrate (LOPRESSOR) 25 mg Oral Tablet       Return in about 3 months (around 10/14/2021), or if symptoms  worsen or fail to improve, for routine follow up, Afib, mitral regurgitation.    This patient was seen jointly with Dr. Deatra Canter.    I independently of the faculty provider spent a total of 35 minutes in direct/indirect care of this patient including initial evaluation, review of laboratory, radiology, diagnostic studies, review of medical record, order entry and coordination of care.    Ashlee O'Kernick, PA-C

## 2021-08-01 ENCOUNTER — Ambulatory Visit (INDEPENDENT_AMBULATORY_CARE_PROVIDER_SITE_OTHER): Payer: Self-pay | Admitting: INTERNAL MEDICINE CARDIOVASCULAR DISEASE

## 2021-08-01 ENCOUNTER — Other Ambulatory Visit (HOSPITAL_BASED_OUTPATIENT_CLINIC_OR_DEPARTMENT_OTHER): Payer: Self-pay | Admitting: Family Medicine

## 2021-08-01 NOTE — Telephone Encounter (Addendum)
Phoned patient LM with call back number.  Hoy Finlay, RN  08/01/2021, 15:25    O'Kernick, Mariane Duval, PA-C  Kadijah Shamoon, RN  I would recommend the referral. I can talk to Dr. Deatra Canter about trying another cardioversion when he is back, but I think the surgical intervention is going to be her best option.   Ashlee O'Kernick, PA-C 08/01/2021, 15:21          Previous Messages      ----- Message from Greer Pickerel, PA-C sent at 08/01/2021  3:19 PM EDT -----  ----- Message from Sherald Barge sent at 08/01/2021  3:04 PM EDT -----  Pt left message and said she is still in Afib and wanted to know if Dr.Ali would consider doing another cardioversion?

## 2021-08-02 ENCOUNTER — Encounter (HOSPITAL_BASED_OUTPATIENT_CLINIC_OR_DEPARTMENT_OTHER): Payer: Self-pay

## 2021-08-05 ENCOUNTER — Encounter (INDEPENDENT_AMBULATORY_CARE_PROVIDER_SITE_OTHER): Payer: Self-pay

## 2021-08-31 ENCOUNTER — Other Ambulatory Visit (HOSPITAL_BASED_OUTPATIENT_CLINIC_OR_DEPARTMENT_OTHER): Payer: Self-pay | Admitting: Family Medicine

## 2021-11-19 ENCOUNTER — Inpatient Hospital Stay (HOSPITAL_COMMUNITY): Admission: RE | Admit: 2021-11-19 | Payer: Medicare HMO | Source: Ambulatory Visit | Admitting: Nurse Practitioner

## 2021-11-19 ENCOUNTER — Ambulatory Visit: Payer: Medicare PPO | Admitting: Family Medicine

## 2021-11-20 ENCOUNTER — Inpatient Hospital Stay (HOSPITAL_COMMUNITY)
Admission: RE | Admit: 2021-11-20 | Discharge: 2021-11-20 | Disposition: A | Discharge status: Home / Self Care | Payer: Medicare HMO | Source: Ambulatory Visit | Attending: Nurse Practitioner | Admitting: Nurse Practitioner

## 2021-11-28 ENCOUNTER — Ambulatory Visit (HOSPITAL_COMMUNITY)
Admission: RE | Admit: 2021-11-28 | Discharge: 2021-11-28 | Disposition: A | Discharge status: Home / Self Care | Payer: Medicare HMO | Source: Ambulatory Visit | Attending: Nurse Practitioner | Admitting: Nurse Practitioner

## 2021-11-28 ENCOUNTER — Encounter (HOSPITAL_COMMUNITY): Payer: Self-pay | Admitting: Nurse Practitioner

## 2021-11-28 VITALS — BP 114/82 | HR 104 | Ht 64.0 in | Wt 277.6 lb

## 2021-11-28 DIAGNOSIS — I10 Essential (primary) hypertension: Secondary | ICD-10-CM | POA: Insufficient documentation

## 2021-11-28 DIAGNOSIS — I4819 Other persistent atrial fibrillation: Secondary | ICD-10-CM | POA: Insufficient documentation

## 2021-11-28 DIAGNOSIS — I480 Paroxysmal atrial fibrillation: Secondary | ICD-10-CM | POA: Diagnosis not present

## 2021-11-28 DIAGNOSIS — D6869 Other thrombophilia: Secondary | ICD-10-CM | POA: Diagnosis not present

## 2021-11-28 DIAGNOSIS — Z7901 Long term (current) use of anticoagulants: Secondary | ICD-10-CM | POA: Diagnosis not present

## 2021-11-28 DIAGNOSIS — Z8542 Personal history of malignant neoplasm of other parts of uterus: Secondary | ICD-10-CM | POA: Diagnosis not present

## 2021-11-28 DIAGNOSIS — Z9884 Bariatric surgery status: Secondary | ICD-10-CM | POA: Insufficient documentation

## 2021-11-28 DIAGNOSIS — Z79899 Other long term (current) drug therapy: Secondary | ICD-10-CM | POA: Diagnosis not present

## 2021-11-28 DIAGNOSIS — I340 Nonrheumatic mitral (valve) insufficiency: Secondary | ICD-10-CM | POA: Diagnosis not present

## 2021-11-28 NOTE — Progress Notes (Signed)
Primary Care Physician: Timmie Foerster, DO Referring Physician: [facility] triage  Cardiologist: Dr. Ernst Bowler Alexis [name] is a 66 y.o. female with a h/o  obesity,HTN, uterine CA,paroxysmal  afib that has had few breakthrough episodes over the years, first diagnosed in  05/2010. She is in the afib clinic for an episode that  lasted for several   days. She states that while she was in afib heart stayed  in the 40's and her blood pressure was low with sys less than 90. She felt weak. She is in SR today with a HR of 53, off diltiazem and valsartan since Wednesday am. BP now  148/84. She states that her heart rate has persistently stayed slow, but it did increase into the 90's with walking in the clinic.She is limited in her ability to walk long distances. No known trigger.She does snore, had an inconclusive sleep study in the past as she could not sleep.She states that she does not think she could tolerant a mask anyway. No alcohol or significant caffiene, no tobacco.   She wore an event monitor which did not show any afib.. It was found that she was taking short acting daily Cardizem and changed to long acting Cardizem as she was noticing more palpitations in the late evening. She reports today that this has helped with irregular heart beat, she has not noted lately. She recently had a change in her BP pill and this has been helpful as well to smooth out BP readings. She is planning to see her pulmonologist as she feels her asthma is worse lately. She is also is planning to move to [place] in February to help take care of her mother.    F/u in afib clinic 11/20/21. I have not seen since 07/16/2017 as she moved to [place] to take care of her mother. As of notes in [place], she was last seen in April of this year  by cardiology with persistent  afib with multiple cardioversion attempts in the past. She states that she was in the hospital in January with afib and cardioverted at that time. Her mother died  in 2022/07/17 and that is when she went back into afib. She has now moved back to [place].  On 08/07/2020, she underwent a laparoscopic sleeve gastrectomy.  She has lost around 100 lbs and is trying  to lose  more to eventually get her knees replaced. She has OSA and qualified for CPAP, but she didn't pick up the machine due to concerns given the recall on the other brand re cancer risk of materials used in the mask. She has no known CAD with low risk PET-CT MPS in July of 2021 (normal myocardial perfusion with no fixed or reversible perfusion defects and normal LV wall function with normal EF at rest and stress). 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 TEE in February 2023 showed EF 60-65% with moderate mitral regurgitation. Findings were discussed with Dr. Etheleen Sia who recommended a repeat echo in 1-2 months to reassess her mitral regurgitation if she remained in sinus rhythm. If Afib recurred or mitral regurgitation remained moderate to severe, he recommended referral to him. She was recently admitted to [facility] 07/02/2021 through 07/03/2021 with atrial fibrillation with RVR. She was started on a Cardizem drip. Her Lopressor was increased to 25 mg twice daily, and she was weaned off Cardizem drip. Due to hypotension, her hydralazine was held. She remained in atrial fibrillation with well-controlled rates. She  reported that she had been having palpitations since discharge, but her heart rate has stayed under 90 bpm. She denies chest pain, dyspnea, pre-syncope and syncope. No TIA or stroke-like symptoms. Provider  discussed referral to CT surgery for evaluation for Cox-Maze procedure and mitral valve repair. She declined at that time and stated that she will think about it. She agreed to call their  office if she decided to proceed with referral.   Today, her ekg shows afib at 104 bpm. I discussed with her that I would not do anything else for her to restore SR until her MV could be further  evaluated as the team in [place] seemed to think she would need surgical intervention.   I will be glad to order a TTE and get her back to Dr. [name] as she wishes to reestablish with him.   I also discussed with Bennett Scrape in structural heart. I don't think we have a cardiothoracic surgeon at this point doing a Cox Maze procedure since Dr. Roxy Manns left,  but Joellen Jersey thinks we may have a new surgeon coming  soon that will be able to do this.  Today, she denies symptoms of palpitations, chest pain,  orthopnea, PND, lower extremity edema, dizziness, presyncope, syncope, or neurologic sequela. The patient is tolerating medications without difficulties and is otherwise without complaint today.   Past Medical History:  Diagnosis Date   Asthma 01/21/2013   Dr. Melvyn Novas   Endometrial cancer [facility]) 01/21/2013   Dr. Polly Cobia, stage I-10/14   HTN (hypertension)    Morbid obesity ([place])    Paroxysmal A-fib ([place])    Uterine cancer ([place])    Past Surgical History:  Procedure Laterality Date   ABLATION     APPENDECTOMY     DILATION AND CURETTAGE OF UTERUS     NASAL SINUS SURGERY      Current Outpatient Medications  Medication Sig Dispense Refill   acetaminophen (TYLENOL) 325 MG tablet Take 650 mg by mouth every 6 (six) hours as needed.     albuterol (VENTOLIN HFA) 108 (90 Base) MCG/ACT inhaler Inhale 2 puffs into the lungs every 4 (four) hours as needed for wheezing. 18 g 3   budesonide-formoterol (SYMBICORT) 160-4.5 MCG/ACT inhaler Inhale 2 puffs into the lungs 2 (two) times daily. 3 Inhaler 3   diltiazem (CARDIZEM CD) 120 MG 24 hr capsule Take 120 mg by mouth 2 (two) times daily.     esomeprazole (NEXIUM) 20 MG capsule Take 30-60 min before first meal of the day 30 capsule 11   fluticasone (FLONASE) 50 MCG/ACT nasal spray Place 2 sprays daily as needed into both nostrils for allergies or rhinitis.     losartan-hydrochlorothiazide (HYZAAR) 100-12.5 MG tablet Take 1 tablet by mouth daily.      warfarin (COUMADIN) 6 MG tablet Take 1 tablet (6 mg total) by mouth as directed.     No current facility-administered medications for this encounter.    Allergies  Allergen Reactions   Ace Inhibitors     Cough / "congestion"   Prednisone Other (See Comments)    Raises her blood pressure   Sulfa Antibiotics Nausea And Vomiting    Other reaction(s): Nausea And Vomiting   Sulfamethoxazole Nausea Only    Social History   Socioeconomic History   Marital status: Married    Spouse name: Not on file   Number of children: Not on file   Years of education: Not on file   Highest education level: Not on file  Occupational History   Not on file  Tobacco Use   Smoking status: Never   Smokeless tobacco: Never  Substance and Sexual Activity   Alcohol use: No   Drug use: Not on file   Sexual activity: Not on file  Other Topics Concern   Not on file  Social History Narrative   Not on file   Social Determinants of Health   Financial Resource Strain: Not on file  Food Insecurity: Not on file  Transportation Needs: Not on file  Physical Activity: Not on file  Stress: Not on file  Social Connections: Not on file  Intimate Partner Violence: Not on file    Family History  Problem Relation Age of Onset   Heart attack Father    Hypertension Father    Diabetes Father    Hyperlipidemia Unknown     ROS- All systems are reviewed and negative except as per the HPI above  Physical Exam: Vitals:   11/28/21 1416 11/28/21 1417  Height: '5\' 4"'$  (1.626 m) '5\' 4"'$  (1.626 m)    Wt Readings from Last 3 Encounters:  11/13/17 (!) 147 kg  04/22/17 (!) 147.2 kg  04/22/17 (!) 147 kg    Labs: Lab Results  Component Value Date   NA 140 01/19/2017   K 3.4 (L) 01/19/2017   CL 104 01/19/2017   CO2 29 01/19/2017   GLUCOSE 106 (H) 01/19/2017   BUN 7 01/19/2017   CREATININE 0.78 01/19/2017   CALCIUM 8.5 (L) 01/19/2017   Lab Results  Component Value Date   INR 1.51 (H) 03/08/2013   No  results found for: "CHOL", "HDL", "LDLCALC", "TRIG"   GEN- The patient is well appearing, alert and oriented x 3 today.   Head- normocephalic, atraumatic Eyes-  Sclera clear, conjunctiva pink Ears- hearing intact Oropharynx- clear Neck- supple, no JVP Lymph- no cervical lymphadenopathy Lungs- Clear to ausculation bilaterally, normal work of breathing Heart- irregular rate and rhythm, no murmurs, rubs or gallops, PMI not laterally displaced GI- soft, NT, ND, + BS Extremities- no clubbing, cyanosis, or edema MS- no significant deformity or atrophy Skin- no rash or lesion Psych- euthymic mood, full affect Neuro- strength and sensation are intact  EKG-Vent. rate 104 BPM PR interval * ms QRS duration 78 ms QT/QTcB 334/439 ms P-R-T axes * 95 -9 Accelerated Junctional rhythm with Premature supraventricular complexes(afib)  Rightward axis Low voltage QRS Cannot rule out Anterior infarct , age undetermined Abnormal ECG When compared with ECG of 22-Apr-2017 14:23, PREVIOUS ECG IS PRESENT   Assessment and Plan: 1. Persistent afib since March of this year with many cardioversion's in the past Most recently care in the [place] area since 2019  ( see notes in Epic) Continue diltiazem 240 mg q day and metoprolol 25 mg bid. Digozin 0.125 mg qd   2. CHA2DS2VASc  score of at least 3 Continue eliquis 5 mg  bid Missed one dose last week   2. HTN stable  3.  Morbid obesity Gastric sleeve surgery last May 2022 with almost 100 lbs lost   4. MV regurgitation  TTE echo ordered to further assess  Team in [facility] felt pt would need surgical intervention   I will refer back to Dr. Elenor Legato C. Corazon Nickolas, [facility] [address] [place], [place] [address] [phone]

## 2021-12-02 ENCOUNTER — Ambulatory Visit (HOSPITAL_COMMUNITY): Admission: RE | Admit: 2021-12-02 | Payer: Medicare HMO | Source: Ambulatory Visit

## 2021-12-10 NOTE — Progress Notes (Deleted)
New Patient Office Visit  Subjective    Patient ID: Alexis [name], female    DOB: Nov 05, 1955  Age: 66 y.o. MRN: [id number]  CC: No chief complaint on file.   HPI Alexis [name] presents to establish care ***  Outpatient Encounter Medications as of 12/11/2021  Medication Sig   acetaminophen (TYLENOL) 325 MG tablet Take 650 mg by mouth every 6 (six) hours as needed. Arthritis   acetaminophen-codeine (TYLENOL #3) 300-30 MG tablet Take 1 tablet by mouth every 8 (eight) hours as needed.   albuterol (VENTOLIN HFA) 108 (90 Base) MCG/ACT inhaler Inhale 2 puffs into the lungs every 4 (four) hours as needed for wheezing.   BIOTIN 5000 PO Take 1 tablet by mouth in the morning.   budesonide-formoterol (SYMBICORT) 160-4.5 MCG/ACT inhaler Inhale 2 puffs into the lungs 2 (two) times daily.   buPROPion (WELLBUTRIN XL) 150 MG 24 hr tablet Take 150 mg by mouth daily.   Calcium Carbonate (CALCIUM 600 PO) Take by mouth. 2 chewables by mouth daily- Bariatric fusion   digoxin (LANOXIN) 0.125 MG tablet Take 125 mcg by mouth daily.   diltiazem (CARDIZEM CD) 240 MG 24 hr capsule Take 240 mg by mouth daily.   ELIQUIS 5 MG TABS tablet Take 5 mg by mouth 2 (two) times daily.   famotidine (PEPCID) 20 MG tablet Take 20 mg by mouth 2 (two) times daily.   fluticasone (FLONASE) 50 MCG/ACT nasal spray Place 2 sprays daily as needed into both nostrils for allergies or rhinitis.   furosemide (LASIX) 40 MG tablet Take 40 mg by mouth as needed.   losartan (COZAAR) 100 MG tablet Take 100 mg by mouth daily.   metoprolol tartrate (LOPRESSOR) 25 MG tablet Take 25 mg by mouth 2 (two) times daily.   montelukast (SINGULAIR) 10 MG tablet Take 10 mg by mouth daily.   Multiple Vitamin (MULTIVITAMIN) tablet Take 1 tablet by mouth daily. Bariatric fusion   traMADol (ULTRAM) 50 MG tablet Take 50 mg by mouth 3 (three) times daily as needed. (Patient not taking: Reported on 11/28/2021)   No facility-administered encounter medications on  file as of 12/11/2021.    Past Medical History:  Diagnosis Date   Asthma 01/21/2013   Dr. Melvyn Novas   Endometrial cancer [facility]) 01/21/2013   Dr. Polly Cobia, stage I-10/14   HTN (hypertension)    Morbid obesity ([place])    Paroxysmal A-fib ([place])    Uterine cancer ([place])     Past Surgical History:  Procedure Laterality Date   ABLATION     APPENDECTOMY     DILATION AND CURETTAGE OF UTERUS     NASAL SINUS SURGERY      Family History  Problem Relation Age of Onset   Heart attack Father    Hypertension Father    Diabetes Father    Hyperlipidemia Unknown     Social History   Socioeconomic History   Marital status: Married    Spouse name: Not on file   Number of children: Not on file   Years of education: Not on file   Highest education level: Not on file  Occupational History   Not on file  Tobacco Use   Smoking status: Never   Smokeless tobacco: Never  Substance and Sexual Activity   Alcohol use: No   Drug use: Not on file   Sexual activity: Not on file  Other Topics Concern   Not on file  Social History Narrative   Not on file   Social Determinants  of Health   Financial Resource Strain: Not on file  Food Insecurity: Not on file  Transportation Needs: Not on file  Physical Activity: Not on file  Stress: Not on file  Social Connections: Not on file  Intimate Partner Violence: Not on file    ROS      Objective    There were no vitals taken for this visit.  Physical Exam  {Labs (Optional):23779}    Assessment & Plan:   Problem List Items Addressed This Visit   None   No follow-ups on file.   Owens Loffler, DO

## 2021-12-11 ENCOUNTER — Ambulatory Visit: Payer: Medicare PPO | Admitting: Family Medicine

## 2021-12-11 NOTE — Addendum Note (Signed)
Encounter addended by: Sherran Needs, NP on: 12/11/2021 12:10 PM  Actions taken: Pend clinical note, Delete clinical note

## 2021-12-12 ENCOUNTER — Other Ambulatory Visit (HOSPITAL_COMMUNITY): Payer: Medicare HMO

## 2021-12-17 ENCOUNTER — Ambulatory Visit (HOSPITAL_COMMUNITY)
Admission: RE | Admit: 2021-12-17 | Discharge: 2021-12-17 | Disposition: A | Discharge status: Home / Self Care | Payer: Medicare HMO | Source: Ambulatory Visit | Attending: Nurse Practitioner | Admitting: Nurse Practitioner

## 2021-12-17 DIAGNOSIS — I4819 Other persistent atrial fibrillation: Secondary | ICD-10-CM | POA: Diagnosis not present

## 2021-12-17 DIAGNOSIS — I340 Nonrheumatic mitral (valve) insufficiency: Secondary | ICD-10-CM | POA: Diagnosis not present

## 2021-12-17 LAB — ECHOCARDIOGRAM COMPLETE
AR max vel: 2.89 cm2
AV Area VTI: 2.75 cm2
AV Area mean vel: 2.67 cm2
AV Mean grad: 3 mmHg
AV Peak grad: 6.1 mmHg
Ao pk vel: 1.23 m/s
Area-P 1/2: 5.13 cm2
MV VTI: 2.61 cm2
S' Lateral: 3.1 cm

## 2021-12-17 NOTE — Progress Notes (Signed)
*  PRELIMINARY RESULTS* Echocardiogram 2D Echocardiogram has been performed.  [name] 12/17/2021, 4:15 PM

## 2021-12-18 ENCOUNTER — Encounter (HOSPITAL_COMMUNITY): Payer: Self-pay | Admitting: *Deleted

## 2022-01-03 ENCOUNTER — Ambulatory Visit: Payer: Medicare HMO | Attending: Cardiology | Admitting: Cardiology

## 2022-01-03 ENCOUNTER — Encounter: Payer: Self-pay | Admitting: Cardiology

## 2022-01-03 VITALS — BP 100/60 | HR 86 | Ht 64.0 in | Wt 288.0 lb

## 2022-01-03 DIAGNOSIS — I4819 Other persistent atrial fibrillation: Secondary | ICD-10-CM

## 2022-01-03 DIAGNOSIS — I340 Nonrheumatic mitral (valve) insufficiency: Secondary | ICD-10-CM

## 2022-01-03 DIAGNOSIS — Z79899 Other long term (current) drug therapy: Secondary | ICD-10-CM | POA: Diagnosis not present

## 2022-01-03 DIAGNOSIS — I480 Paroxysmal atrial fibrillation: Secondary | ICD-10-CM | POA: Diagnosis not present

## 2022-01-03 DIAGNOSIS — Z6841 Body Mass Index (BMI) 40.0 and over, adult: Secondary | ICD-10-CM | POA: Diagnosis not present

## 2022-01-03 DIAGNOSIS — H35033 Hypertensive retinopathy, bilateral: Secondary | ICD-10-CM | POA: Diagnosis not present

## 2022-01-03 DIAGNOSIS — H524 Presbyopia: Secondary | ICD-10-CM | POA: Diagnosis not present

## 2022-01-03 NOTE — Progress Notes (Signed)
Cardiology Office Note:    Date:  01/03/2022   ID:  Alexis [name], DOB June 24, 1955, MRN [id number]  PCP:  System, Provider Not In   [facility] Providers Cardiologist:  None     Referring MD: Sherran Needs, NP   History of Present Illness:    Alexis [name] is a 66 y.o. female here for the evaluation of paroxysmal a-fib at the request of Dr. Roderic Palau, and to re-establish care.  We saw each other last in 2016.  Today: She continues to have intermittent episodes of atrial fibrillation. Only occasionally she notices her heart rate as high as 100-112 bpm. She states that she prefers to stay away from alternative treatments like ablation and pacemakers. She has had at least 15 prior cardioversions. She states that there was a time where her chest would be throbbing because of her sleeping position, which also triggered her atrial fibrillation. This has not occurred for over a year.  In 2022 she had bariatric surgery and successfully lost 100 lbs. Recently she has regained about 12 lbs which she mostly attributes to stress from the recent loss of her mother. After that, she moved back here from [place] and bought a house with her husband. Of note, just after her surgery she immediately went into atrial fibrillation and was admitted for observation. She was started on digoxin at that time.  She is currently taking Eliquis and digoxin. She denies any bleeding issues since 5-6 years ago when she was switched to Eliquis from [place].   She denies any chest pain, shortness of breath, or peripheral edema. No lightheadedness, headaches, syncope, orthopnea, or PND.  Prior records reviewed data reviewed lab work reviewed  Past Medical History:  Diagnosis Date   Asthma 01/21/2013   Dr. Melvyn Novas   Endometrial cancer [facility]) 01/21/2013   Dr. Polly Cobia, stage I-10/14   HTN (hypertension)    Morbid obesity ([place])    Paroxysmal A-fib ([place])    Uterine cancer ([place])     Past Surgical History:   Procedure Laterality Date   ABLATION     APPENDECTOMY     DILATION AND CURETTAGE OF UTERUS     NASAL SINUS SURGERY      Current Medications: Current Meds  Medication Sig   acetaminophen (TYLENOL) 325 MG tablet Take 650 mg by mouth every 6 (six) hours as needed. Arthritis   acetaminophen-codeine (TYLENOL #3) 300-30 MG tablet Take 1 tablet by mouth every 8 (eight) hours as needed.   albuterol (VENTOLIN HFA) 108 (90 Base) MCG/ACT inhaler Inhale 2 puffs into the lungs every 4 (four) hours as needed for wheezing.   BIOTIN 5000 PO Take 1 tablet by mouth in the morning.   budesonide-formoterol (SYMBICORT) 160-4.5 MCG/ACT inhaler Inhale 2 puffs into the lungs 2 (two) times daily.   buPROPion (WELLBUTRIN XL) 150 MG 24 hr tablet Take 150 mg by mouth daily.   Calcium Carbonate (CALCIUM 600 PO) Take by mouth. 2 chewables by mouth daily- Bariatric fusion   digoxin (LANOXIN) 0.125 MG tablet Take 125 mcg by mouth daily.   diltiazem (CARDIZEM CD) 240 MG 24 hr capsule Take 240 mg by mouth daily.   ELIQUIS 5 MG TABS tablet Take 5 mg by mouth 2 (two) times daily.   famotidine (PEPCID) 20 MG tablet Take 20 mg by mouth 2 (two) times daily.   fluticasone (FLONASE) 50 MCG/ACT nasal spray Place 2 sprays daily as needed into both nostrils for allergies or rhinitis.   furosemide (LASIX) 40 MG  tablet Take 40 mg by mouth as needed.   losartan (COZAAR) 100 MG tablet Take 100 mg by mouth daily.   metoprolol tartrate (LOPRESSOR) 25 MG tablet Take 25 mg by mouth 2 (two) times daily.   montelukast (SINGULAIR) 10 MG tablet Take 10 mg by mouth daily.   Multiple Vitamin (MULTIVITAMIN) tablet Take 1 tablet by mouth daily. Bariatric fusion   traMADol (ULTRAM) 50 MG tablet Take 50 mg by mouth 3 (three) times daily as needed.     Allergies:   Ace inhibitors, Ciprofloxacin, Cocoa, Flavoring agent, Prednisone, Sulfa antibiotics, and Sulfamethoxazole   Social History   Socioeconomic History   Marital status: Married     Spouse name: Not on file   Number of children: Not on file   Years of education: Not on file   Highest education level: Not on file  Occupational History   Not on file  Tobacco Use   Smoking status: Never   Smokeless tobacco: Never  Substance and Sexual Activity   Alcohol use: No   Drug use: Not on file   Sexual activity: Not on file  Other Topics Concern   Not on file  Social History Narrative   Not on file   Social Determinants of Health   Financial Resource Strain: Not on file  Food Insecurity: Not on file  Transportation Needs: Not on file  Physical Activity: Not on file  Stress: Not on file  Social Connections: Not on file     Family History: The patient's family history includes Diabetes in her father; Heart attack in her father; Hyperlipidemia in her unknown relative; Hypertension in her father.  ROS:   Please see the history of present illness.    (+) Palpitations All other systems reviewed and are negative.  EKGs/Labs/Other Studies Reviewed:    The following studies were reviewed today:  Echo 12/17/2021:  IMPRESSIONS    1. Left ventricular ejection fraction, by estimation, is 55 to 60%. The  left ventricle has normal function. The left ventricle has no regional  wall motion abnormalities. Left ventricular diastolic parameters are  indeterminate.   2. Right ventricular systolic function is low normal. The right  ventricular size is normal. There is normal pulmonary artery systolic  pressure. The estimated right ventricular systolic pressure is 54.2 mmHg.   3. The mitral valve is grossly normal. Mild mitral valve regurgitation.   4. The aortic valve is tricuspid. Aortic valve regurgitation is not  visualized. Aortic valve sclerosis is present, with no evidence of aortic  valve stenosis. Aortic valve mean gradient measures 3.0 mmHg.   5. The inferior vena cava is dilated in size with <50% respiratory  variability, suggesting right atrial pressure of 15  mmHg.   Comparison(s): Prior images unable to be directly viewed.   Cardiac Telemetry monitoring 02/11/2017: No atrial fibrillation detected Occasional sinus bradycardia noted, ranging from 45-59 bpm. Symptoms of fatigue were associated with both sinus rhythm as well as sinus bradycardia. There were no significant pauses.   Overall reassuring monitor.  Rhythm does not explain fatigue.  EKG:  EKG is personally reviewed and interpreted. 01/03/2022: EKG was not ordered.   Recent Labs: No results found for requested labs within last 365 days.   Recent Lipid Panel No results found for: "CHOL", "TRIG", "HDL", "CHOLHDL", "VLDL", "LDLCALC", "LDLDIRECT"   Risk Assessment/Calculations:          Physical Exam:    VS:  BP 100/60 (BP Location: Left Arm, Patient Position: Sitting, Cuff Size:  Large)   Pulse 86   Ht '5\' 4"'$  (1.626 m)   Wt 288 lb (130.6 kg)   SpO2 96%   BMI 49.44 kg/m     Wt Readings from Last 3 Encounters:  01/03/22 288 lb (130.6 kg)  11/28/21 277 lb 9.6 oz (125.9 kg)  11/13/17 (!) 324 lb (147 kg)     GEN: Well nourished, well developed in no acute distress; In wheelchair. HEENT: Normal NECK: No JVD; No carotid bruits LYMPHATICS: No lymphadenopathy CARDIAC: Irregularly irregular, no murmurs, rubs, gallops RESPIRATORY:  Clear to auscultation without rales, wheezing or rhonchi  ABDOMEN: Soft, non-tender, non-distended MUSCULOSKELETAL:  No edema; No deformity  SKIN: Warm and dry NEUROLOGIC:  Alert and oriented x 3 PSYCHIATRIC:  Normal affect   ASSESSMENT:    1. Paroxysmal A-fib ([place])   2. Medication management   3. Persistent atrial fibrillation ([place])   4. Mitral valve insufficiency, unspecified etiology    PLAN:    In order of problems listed above:  Persistent atrial fibrillation - I do not think that long-term ablation strategies would be durable for her.  Lets continue with rate control.  Medications include metoprolol diltiazem as well as  digoxin.  Medication management - We will check digoxin level to make sure that there is no evidence of toxicity.  She has been on this since her last hospitalization.  Mitral regurgitation - Appears mild on most recent echocardiogram.  I do not see severe MR.  No need for surgical intervention.  We discussed.  Personally reviewed and interpreted images.  Showed her the images.  Morbid obesity - Gastric sleeve.  Lost 100 pounds.  She has gained 12 back.    Follow-up: 6 months.   Medication Adjustments/Labs and Tests Ordered: Current medicines are reviewed at length with the patient today.  Concerns regarding medicines are outlined above.   Orders Placed This Encounter  Procedures   Digoxin level   No orders of the defined types were placed in this encounter.  Patient Instructions  Medication Instructions:  The current medical regimen is effective;  continue present plan and medications.  *If you need a refill on your cardiac medications before your next appointment, please call your pharmacy*   Lab: Please have a digoxin level today.  Follow-Up: At [facility], you and your health needs are our priority.  As part of our continuing mission to provide you with exceptional heart care, we have created designated Provider Care Teams.  These Care Teams include your primary Cardiologist (physician) and Advanced Practice Providers (APPs -  Physician Assistants and Nurse Practitioners) who all work together to provide you with the care you need, when you need it.  We recommend signing up for the patient portal called "MyChart".  Sign up information is provided on this After Visit Summary.  MyChart is used to connect with patients for Virtual Visits (Telemedicine).  Patients are able to view lab/test results, encounter notes, upcoming appointments, etc.  Non-urgent messages can be sent to your provider as well.   To learn more about what you can do with MyChart, go to  [email or web address].    Your next appointment:   6 month(s)  The format for your next appointment:   In Person  Provider:   Nicholes Rough, PA-C, Melina Copa, PA-C, Ambrose Pancoast, NP, Ermalinda Barrios, PA-C, Christen Bame, NP, or Richardson Dopp, PA-C          Important Information About Sugar  I,Mathew Stumpf,acting as a Education administrator for [employer], MD.,have documented all relevant documentation on the behalf of Candee Furbish, MD,as directed by  Candee Furbish, MD while in the presence of Candee Furbish, MD.  I, Candee Furbish, MD, have reviewed all documentation for this visit. The documentation on 01/03/22 for the exam, diagnosis, procedures, and orders are all accurate and complete.   Signed, Candee Furbish, MD  01/03/2022 4:14 PM    [facility]

## 2022-01-03 NOTE — Patient Instructions (Addendum)
Medication Instructions:  The current medical regimen is effective;  continue present plan and medications.  *If you need a refill on your cardiac medications before your next appointment, please call your pharmacy*   Lab: Please have a digoxin level today.  Follow-Up: At [facility], you and your health needs are our priority.  As part of our continuing mission to provide you with exceptional heart care, we have created designated Provider Care Teams.  These Care Teams include your primary Cardiologist (physician) and Advanced Practice Providers (APPs -  Physician Assistants and Nurse Practitioners) who all work together to provide you with the care you need, when you need it.  We recommend signing up for the patient portal called "MyChart".  Sign up information is provided on this After Visit Summary.  MyChart is used to connect with patients for Virtual Visits (Telemedicine).  Patients are able to view lab/test results, encounter notes, upcoming appointments, etc.  Non-urgent messages can be sent to your provider as well.   To learn more about what you can do with MyChart, go to [email or web address].    Your next appointment:   6 month(s)  The format for your next appointment:   In Person  Provider:   Nicholes Rough, PA-C, Melina Copa, PA-C, Ambrose Pancoast, NP, Ermalinda Barrios, PA-C, Christen Bame, NP, or Richardson Dopp, PA-C          Important Information About Sugar

## 2022-01-04 LAB — DIGOXIN LEVEL: Digoxin, Serum: 1.4 ng/mL — ABNORMAL HIGH (ref 0.5–0.9)

## 2022-01-07 ENCOUNTER — Telehealth: Payer: Self-pay | Admitting: *Deleted

## 2022-01-07 ENCOUNTER — Other Ambulatory Visit: Payer: Self-pay | Admitting: *Deleted

## 2022-01-07 DIAGNOSIS — Z79899 Other long term (current) drug therapy: Secondary | ICD-10-CM

## 2022-01-07 DIAGNOSIS — I480 Paroxysmal atrial fibrillation: Secondary | ICD-10-CM

## 2022-01-07 NOTE — Telephone Encounter (Signed)
Digoxin level is high at 1.4  Currently on Digoxin 0.'125mg'$  once a day.   Let's adjust to Digoxin 0.'125mg'$  every other day.  Repeat Digoxin level in one month.  Candee Furbish, MD   Pt aware/orders placed and released.

## 2022-01-14 ENCOUNTER — Encounter (INDEPENDENT_AMBULATORY_CARE_PROVIDER_SITE_OTHER): Payer: Self-pay

## 2022-01-15 ENCOUNTER — Encounter (INDEPENDENT_AMBULATORY_CARE_PROVIDER_SITE_OTHER): Payer: Medicare PPO

## 2022-01-23 ENCOUNTER — Ambulatory Visit (INDEPENDENT_AMBULATORY_CARE_PROVIDER_SITE_OTHER): Payer: Medicare HMO | Admitting: Primary Care

## 2022-04-23 ENCOUNTER — Encounter: Payer: Self-pay | Admitting: Family Medicine

## 2022-04-23 ENCOUNTER — Ambulatory Visit (INDEPENDENT_AMBULATORY_CARE_PROVIDER_SITE_OTHER): Payer: Medicare HMO | Admitting: Family Medicine

## 2022-04-23 VITALS — BP 128/70 | HR 92 | Ht 62.0 in | Wt 292.0 lb

## 2022-04-23 DIAGNOSIS — Z13820 Encounter for screening for osteoporosis: Secondary | ICD-10-CM | POA: Diagnosis not present

## 2022-04-23 DIAGNOSIS — M25561 Pain in right knee: Secondary | ICD-10-CM

## 2022-04-23 DIAGNOSIS — G8929 Other chronic pain: Secondary | ICD-10-CM

## 2022-04-23 DIAGNOSIS — E785 Hyperlipidemia, unspecified: Secondary | ICD-10-CM

## 2022-04-23 DIAGNOSIS — Z114 Encounter for screening for human immunodeficiency virus [HIV]: Secondary | ICD-10-CM

## 2022-04-23 DIAGNOSIS — I10 Essential (primary) hypertension: Secondary | ICD-10-CM

## 2022-04-23 DIAGNOSIS — L03119 Cellulitis of unspecified part of limb: Secondary | ICD-10-CM | POA: Diagnosis not present

## 2022-04-23 DIAGNOSIS — R21 Rash and other nonspecific skin eruption: Secondary | ICD-10-CM | POA: Diagnosis not present

## 2022-04-23 DIAGNOSIS — Z1159 Encounter for screening for other viral diseases: Secondary | ICD-10-CM | POA: Diagnosis not present

## 2022-04-23 DIAGNOSIS — M25562 Pain in left knee: Secondary | ICD-10-CM

## 2022-04-23 DIAGNOSIS — R7301 Impaired fasting glucose: Secondary | ICD-10-CM

## 2022-04-23 DIAGNOSIS — E039 Hypothyroidism, unspecified: Secondary | ICD-10-CM

## 2022-04-23 MED ORDER — FAMOTIDINE 20 MG PO TABS
20.0000 mg | ORAL_TABLET | Freq: Two times a day (BID) | ORAL | 3 refills | Status: DC
Start: 2022-04-23 — End: 2022-07-07

## 2022-04-23 MED ORDER — LOSARTAN POTASSIUM 100 MG PO TABS: 100.0000 mg | ORAL_TABLET | Freq: Every day | ORAL | 2 refills | Status: DC

## 2022-04-23 MED ORDER — BUDESONIDE-FORMOTEROL FUMARATE 160-4.5 MCG/ACT IN AERO
2.0000 | INHALATION_SPRAY | Freq: Two times a day (BID) | RESPIRATORY_TRACT | 3 refills | Status: DC
Start: 2022-04-23 — End: 2022-11-28

## 2022-04-23 MED ORDER — CEPHALEXIN 500 MG PO CAPS: 500.0000 mg | ORAL_CAPSULE | Freq: Four times a day (QID) | ORAL | 0 refills | Status: DC

## 2022-04-23 MED ORDER — ALBUTEROL SULFATE HFA 108 (90 BASE) MCG/ACT IN AERS: 2.0000 | INHALATION_SPRAY | RESPIRATORY_TRACT | 3 refills | Status: DC | PRN

## 2022-04-23 MED ORDER — MONTELUKAST SODIUM 10 MG PO TABS: 10.0000 mg | ORAL_TABLET | Freq: Every day | ORAL | 2 refills | Status: DC

## 2022-04-23 MED ORDER — ELIQUIS 5 MG PO TABS
5.0000 mg | ORAL_TABLET | Freq: Two times a day (BID) | ORAL | 2 refills | Status: DC
Start: 2022-04-23 — End: 2022-08-12

## 2022-04-23 MED ORDER — METOPROLOL SUCCINATE ER 25 MG PO TB24: 25.0000 mg | ORAL_TABLET | Freq: Two times a day (BID) | ORAL | 2 refills | Status: DC

## 2022-04-23 MED ORDER — DILTIAZEM HCL ER COATED BEADS 240 MG PO CP24: 240.0000 mg | ORAL_CAPSULE | Freq: Every day | ORAL | 2 refills | Status: DC

## 2022-04-23 NOTE — Patient Instructions (Signed)
Please Follow up in one month for Pap smear

## 2022-04-23 NOTE — Progress Notes (Deleted)
New Patient Office Visit   Subjective   Patient ID: Alexis [name], female    DOB: January 29, 1956  Age: 67 y.o. MRN: [id number]  CC:  Chief Complaint  Patient presents with   Establish Care    HPI Alexis [name] presents to establish care. She  has a past medical history of Asthma (01/21/2013), Endometrial cancer ([place]) (01/21/2013), HTN (hypertension), Morbid obesity ([place]), Paroxysmal A-fib ([place]), and Uterine cancer ([place]).   with erythema and tenderness.  Location: Bilateral shins Onset: recurrent  Duration:  Associated symptoms: edema  Recent treatment: none Functional status affected: no ***  Allergies: Ace inhibitors, Ciprofloxacin, Cocoa, Flavoring agent, Prednisone, Sulfa antibiotics, and Sulfamethoxazole Patient Active Problem List   Diagnosis Date Noted   Upper airway cough syndrome 02/16/2019   History of cardioversion 01/27/2019   Osteoarthritis 07/06/2017   BMI 50.0-59.9, adult ([place]) 09/12/2016   Gastroesophageal reflux disease without esophagitis 09/04/2016   Long term (current) use of anticoagulants 07/09/2016   Primary osteoarthritis of both knees 07/02/2016   Controlled substance agreement signed 06/06/2016   Knee pain, bilateral 02/25/2016   Major depressive disorder, recurrent, in full remission ([place]) 02/25/2016   CKD (chronic kidney disease) stage 3, GFR 30-59 ml/min (HCC) 02/11/2016   Leg swelling 02/11/2016   History of adenomatous polyp of colon 01/26/2016   Chronic pain 10/18/2015   Asthma, intermittent, uncomplicated 62/94/7654   Hypersomnolence 08/24/2013   History of endometrial cancer 04/07/2013   Asthma, mild persistent with major component of UACS worse on ACEi  01/21/2013   Endometrial cancer ([place]) 01/21/2013   Pre-operative cardiovascular examination 01/21/2013   Paroxysmal A-fib ([place])    Morbid (severe) obesity due to excess calories ([place])    Essential hypertension      Outpatient Encounter Medications as of 04/23/2022  Medication Sig    Calcium Carbonate (CALCIUM 600 PO) Take by mouth. 2 chewables by mouth daily- Bariatric fusion   cephALEXin (KEFLEX) 500 MG capsule Take 1 capsule (500 mg total) by mouth 4 (four) times daily.   Cholecalciferol 1.25 MG (50000 UT) capsule Take by mouth.   digoxin (LANOXIN) 0.25 MG tablet Take 250 mcg by mouth daily.   FLUAD QUADRIVALENT 0.5 ML injection    fluticasone (FLONASE) 50 MCG/ACT nasal spray Place 2 sprays daily as needed into both nostrils for allergies or rhinitis.   furosemide (LASIX) 40 MG tablet Take 40 mg by mouth as needed.   Multiple Vitamin (MULTIVITAMIN) tablet Take 1 tablet by mouth daily. Bariatric fusion   traMADol (ULTRAM) 50 MG tablet Take 50 mg by mouth 3 (three) times daily as needed.   [DISCONTINUED] acetaminophen (TYLENOL) 325 MG tablet Take 650 mg by mouth every 6 (six) hours as needed. Arthritis   [DISCONTINUED] acetaminophen-codeine (TYLENOL #3) 300-30 MG tablet Take 1 tablet by mouth every 8 (eight) hours as needed.   [DISCONTINUED] albuterol (VENTOLIN HFA) 108 (90 Base) MCG/ACT inhaler Inhale 2 puffs into the lungs every 4 (four) hours as needed for wheezing.   [DISCONTINUED] BIOTIN 5000 PO Take 1 tablet by mouth in the morning.   [DISCONTINUED] budesonide-formoterol (SYMBICORT) 160-4.5 MCG/ACT inhaler Inhale 2 puffs into the lungs 2 (two) times daily.   [DISCONTINUED] buPROPion (WELLBUTRIN XL) 150 MG 24 hr tablet Take 150 mg by mouth daily.   [DISCONTINUED] digoxin (LANOXIN) 0.125 MG tablet Take 125 mcg by mouth every other day.   [DISCONTINUED] diltiazem (CARDIZEM CD) 240 MG 24 hr capsule Take 240 mg by mouth daily.   [DISCONTINUED] ELIQUIS 5 MG TABS tablet Take  5 mg by mouth 2 (two) times daily.   [DISCONTINUED] famotidine (PEPCID) 20 MG tablet Take 20 mg by mouth 2 (two) times daily.   [DISCONTINUED] losartan (COZAAR) 100 MG tablet Take 100 mg by mouth daily.   [DISCONTINUED] metoprolol succinate (TOPROL-XL) 25 MG 24 hr tablet Take 25 mg by mouth 2 (two) times  daily.   [DISCONTINUED] metoprolol tartrate (LOPRESSOR) 25 MG tablet Take 25 mg by mouth 2 (two) times daily.   [DISCONTINUED] montelukast (SINGULAIR) 10 MG tablet Take 10 mg by mouth daily.   [DISCONTINUED] TIADYLT ER 240 MG 24 hr capsule Take by mouth daily.   albuterol (VENTOLIN HFA) 108 (90 Base) MCG/ACT inhaler Inhale 2 puffs into the lungs every 4 (four) hours as needed for wheezing.   budesonide-formoterol (SYMBICORT) 160-4.5 MCG/ACT inhaler Inhale 2 puffs into the lungs 2 (two) times daily.   diltiazem (CARDIZEM CD) 240 MG 24 hr capsule Take 1 capsule (240 mg total) by mouth daily.   ELIQUIS 5 MG TABS tablet Take 1 tablet (5 mg total) by mouth 2 (two) times daily.   famotidine (PEPCID) 20 MG tablet Take 1 tablet (20 mg total) by mouth 2 (two) times daily.   losartan (COZAAR) 100 MG tablet Take 1 tablet (100 mg total) by mouth daily.   metoprolol succinate (TOPROL-XL) 25 MG 24 hr tablet Take 1 tablet (25 mg total) by mouth 2 (two) times daily.   montelukast (SINGULAIR) 10 MG tablet Take 1 tablet (10 mg total) by mouth daily.   No facility-administered encounter medications on file as of 04/23/2022.    Past Surgical History:  Procedure Laterality Date   ABLATION     APPENDECTOMY     DILATION AND CURETTAGE OF UTERUS     NASAL SINUS SURGERY      ROS    Objective    BP 128/70   Pulse 92   Ht '5\' 2"'$  (1.575 m)   Wt 292 lb (132.5 kg)   SpO2 95%   BMI 53.41 kg/m   Physical Exam    Assessment & Plan:  Cellulitis of lower extremity, unspecified laterality -     Cephalexin; Take 1 capsule (500 mg total) by mouth 4 (four) times daily.  Dispense: 26 capsule; Refill: 0  Chronic pain of both knees -     Ambulatory referral to Pain Clinic  Rash -     Ambulatory referral to Dermatology  Screening for osteoporosis -     HM DEXA SCAN  Primary hypertension -     CBC with Differential/Platelet -     CMP14+EGFR -     Microalbumin / creatinine urine ratio  Hyperlipidemia,  unspecified hyperlipidemia type -     Lipid panel  IFG (impaired fasting glucose) -     Hemoglobin A1c  Need for hepatitis C screening test -     Hepatitis C antibody  Encounter for screening for HIV -     HIV Antibody (routine testing w rflx)  Hypothyroidism, unspecified type -     TSH + free T4  Other orders -     Albuterol Sulfate HFA; Inhale 2 puffs into the lungs every 4 (four) hours as needed for wheezing.  Dispense: 18 g; Refill: 3 -     Budesonide-Formoterol Fumarate; Inhale 2 puffs into the lungs 2 (two) times daily.  Dispense: 3 each; Refill: 3 -     Famotidine; Take 1 tablet (20 mg total) by mouth 2 (two) times daily.  Dispense: 45 tablet; Refill: 3 -  dilTIAZem HCl ER Coated Beads; Take 1 capsule (240 mg total) by mouth daily.  Dispense: 30 capsule; Refill: 2 -     Eliquis; Take 1 tablet (5 mg total) by mouth 2 (two) times daily.  Dispense: 60 tablet; Refill: 2 -     Losartan Potassium; Take 1 tablet (100 mg total) by mouth daily.  Dispense: 30 tablet; Refill: 2 -     Metoprolol Succinate ER; Take 1 tablet (25 mg total) by mouth 2 (two) times daily.  Dispense: 60 tablet; Refill: 2 -     Montelukast Sodium; Take 1 tablet (10 mg total) by mouth daily.  Dispense: 30 tablet; Refill: 2    No follow-ups on file.   Renard Hamper Ria Comment, FNP

## 2022-04-23 NOTE — Progress Notes (Deleted)
Complete physical exam  Patient: Alexis [name]   DOB: 1955-11-20   67 y.o. Female  MRN: [id number]  Subjective:    Chief Complaint  Patient presents with   [place] is a 67 y.o. female who presents today for a complete physical exam. She reports consuming a general diet.  Patient participate in chair yoga, foot cycle   She generally feels well. She reports sleeping {DESC; WELL/FAIRLY WELL/POORLY:18703}. She {does/does not:200015} have additional problems to discuss today.    Most recent fall risk assessment:     No data to display           Most recent depression screenings:     No data to display          {VISON DENTAL STD PSA (Optional):27386}  Patient Care Team: Del Ria Comment, Lamar Benes, FNP as PCP - General (Family Medicine)   Outpatient Medications Prior to Visit  Medication Sig   albuterol (VENTOLIN HFA) 108 (90 Base) MCG/ACT inhaler Inhale 2 puffs into the lungs every 4 (four) hours as needed for wheezing.   budesonide-formoterol (SYMBICORT) 160-4.5 MCG/ACT inhaler Inhale 2 puffs into the lungs 2 (two) times daily.   buPROPion (WELLBUTRIN XL) 150 MG 24 hr tablet Take 150 mg by mouth daily.   Calcium Carbonate (CALCIUM 600 PO) Take by mouth. 2 chewables by mouth daily- Bariatric fusion   Cholecalciferol 1.25 MG (50000 UT) capsule Take by mouth.   digoxin (LANOXIN) 0.25 MG tablet Take 250 mcg by mouth daily.   diltiazem (CARDIZEM CD) 240 MG 24 hr capsule Take 240 mg by mouth daily.   ELIQUIS 5 MG TABS tablet Take 5 mg by mouth 2 (two) times daily.   famotidine (PEPCID) 20 MG tablet Take 20 mg by mouth 2 (two) times daily.   FLUAD QUADRIVALENT 0.5 ML injection    fluticasone (FLONASE) 50 MCG/ACT nasal spray Place 2 sprays daily as needed into both nostrils for allergies or rhinitis.   furosemide (LASIX) 40 MG tablet Take 40 mg by mouth as needed.   losartan (COZAAR) 100 MG tablet Take 100 mg by mouth daily.   metoprolol succinate  (TOPROL-XL) 25 MG 24 hr tablet Take 25 mg by mouth 2 (two) times daily.   montelukast (SINGULAIR) 10 MG tablet Take 10 mg by mouth daily.   Multiple Vitamin (MULTIVITAMIN) tablet Take 1 tablet by mouth daily. Bariatric fusion   traMADol (ULTRAM) 50 MG tablet Take 50 mg by mouth 3 (three) times daily as needed.   [DISCONTINUED] acetaminophen (TYLENOL) 325 MG tablet Take 650 mg by mouth every 6 (six) hours as needed. Arthritis   [DISCONTINUED] acetaminophen-codeine (TYLENOL #3) 300-30 MG tablet Take 1 tablet by mouth every 8 (eight) hours as needed.   [DISCONTINUED] BIOTIN 5000 PO Take 1 tablet by mouth in the morning.   [DISCONTINUED] digoxin (LANOXIN) 0.125 MG tablet Take 125 mcg by mouth every other day.   [DISCONTINUED] metoprolol tartrate (LOPRESSOR) 25 MG tablet Take 25 mg by mouth 2 (two) times daily.   [DISCONTINUED] TIADYLT ER 240 MG 24 hr capsule Take by mouth daily.   No facility-administered medications prior to visit.    ROS     Objective:    BP 128/70   Pulse 92   Ht '5\' 2"'$  (1.575 m)   Wt 292 lb (132.5 kg)   SpO2 95%   BMI 53.41 kg/m  {Vitals History (Optional):23777}  Physical Exam   No results found for any visits on 04/23/22.  Assessment & Plan:    Routine Health Maintenance and Physical Exam  Immunization History  Administered Date(s) Administered   Influenza Inj Mdck Quad Pf 02/25/2016, 01/12/2017   Influenza Split 03/26/2013, 02/21/2015, 02/25/2016, 01/12/2017, 01/26/2017, 02/03/2018, 01/07/2019, 01/30/2020   Influenza Whole 02/21/2015   Influenza, Seasonal, Injecte, Preservative Fre 02/21/2015, 02/25/2016   Influenza,inj,Quad PF,6+ Mos 01/07/2019   Influenza,inj,quad, With Preservative 02/25/2016, 01/12/2017   Influenza,trivalent, recombinat, inj, PF 03/26/2013, 02/21/2015, 01/26/2017   Influenza-Unspecified 03/26/2013, 02/21/2015, 02/25/2016, 01/12/2017, 01/26/2017, 02/03/2018, 01/07/2019, 01/30/2020   PFIZER Comirnaty(Gray Top)Covid-19 Tri-Sucrose  Vaccine 11/14/2019, 12/19/2019, 07/04/2020   PFIZER(Purple Top)SARS-COV-2 Vaccination 11/14/2019, 12/19/2019   Pneumococcal Conjugate-13 03/26/2013   Pneumococcal Polysaccharide-23 02/21/2015   Pneumococcal-Unspecified 02/21/2015    Health Maintenance  Topic Date Due   HIV Screening  Never done   Hepatitis C Screening  Never done   DTaP/Tdap/Td (1 - Tdap) Never done   Zoster Vaccines- Shingrix (1 of 2) Never done   MAMMOGRAM  08/21/2011   PAP SMEAR-Modifier  10/06/2015   Medicare Annual Wellness (AWV)  02/17/2019   Pneumonia Vaccine 69+ Years old (3 - PPSV23 or PCV20) 04/25/2021   DEXA SCAN  Never done   INFLUENZA VACCINE  11/05/2021   COVID-19 Vaccine (6 - 2023-24 season) 12/06/2021   COLONOSCOPY (Pts 45-75yr Insurance coverage will need to be confirmed)  01/22/2026 (Originally 04/25/2001)   HPV VACCINES  Aged Out    Discussed health benefits of physical activity, and encouraged her to engage in regular exercise appropriate for her age and condition.  There are no diagnoses linked to this encounter.  No follow-ups on file.     IRenard HamperORia Comment FNP

## 2022-04-27 DIAGNOSIS — L03119 Cellulitis of unspecified part of limb: Secondary | ICD-10-CM | POA: Insufficient documentation

## 2022-04-27 DIAGNOSIS — L03116 Cellulitis of left lower limb: Secondary | ICD-10-CM | POA: Insufficient documentation

## 2022-04-27 DIAGNOSIS — L03115 Cellulitis of right lower limb: Secondary | ICD-10-CM | POA: Insufficient documentation

## 2022-04-27 NOTE — Assessment & Plan Note (Signed)
Cellulitis noted on patient's shins. Erythema, warm and tender to touch,swelling noted Prescribed Keflex 500 mg, referral to wound care specialist

## 2022-04-27 NOTE — Progress Notes (Signed)
New Patient Office Visit   Subjective   Patient ID: Alexis [name], female    DOB: 10/15/1955  Age: 67 y.o. MRN: [id number]  CC:  Chief Complaint  Patient presents with   Establish Care    HPI Alexis [name] presents to establish care. She  has a past medical history of Asthma (01/21/2013), Endometrial cancer ([place]) (01/21/2013), HTN (hypertension), Morbid obesity ([place]), Paroxysmal A-fib ([place]), and Uterine cancer ([place]).  HPI  She presents to the clinic with history of bilateral lower extremity cellulitis.  Patient states this has been going on for years with no resolution. Patient denies pain, itching, numbness tingling.  Outpatient Encounter Medications as of 04/23/2022  Medication Sig   Calcium Carbonate (CALCIUM 600 PO) Take by mouth. 2 chewables by mouth daily- Bariatric fusion   cephALEXin (KEFLEX) 500 MG capsule Take 1 capsule (500 mg total) by mouth 4 (four) times daily.   Cholecalciferol 1.25 MG (50000 UT) capsule Take by mouth.   digoxin (LANOXIN) 0.25 MG tablet Take 250 mcg by mouth daily.   FLUAD QUADRIVALENT 0.5 ML injection    fluticasone (FLONASE) 50 MCG/ACT nasal spray Place 2 sprays daily as needed into both nostrils for allergies or rhinitis.   furosemide (LASIX) 40 MG tablet Take 40 mg by mouth as needed.   Multiple Vitamin (MULTIVITAMIN) tablet Take 1 tablet by mouth daily. Bariatric fusion   traMADol (ULTRAM) 50 MG tablet Take 50 mg by mouth 3 (three) times daily as needed.   [DISCONTINUED] acetaminophen (TYLENOL) 325 MG tablet Take 650 mg by mouth every 6 (six) hours as needed. Arthritis   [DISCONTINUED] acetaminophen-codeine (TYLENOL #3) 300-30 MG tablet Take 1 tablet by mouth every 8 (eight) hours as needed.   [DISCONTINUED] albuterol (VENTOLIN HFA) 108 (90 Base) MCG/ACT inhaler Inhale 2 puffs into the lungs every 4 (four) hours as needed for wheezing.   [DISCONTINUED] BIOTIN 5000 PO Take 1 tablet by mouth in the morning.   [DISCONTINUED] budesonide-formoterol  (SYMBICORT) 160-4.5 MCG/ACT inhaler Inhale 2 puffs into the lungs 2 (two) times daily.   [DISCONTINUED] buPROPion (WELLBUTRIN XL) 150 MG 24 hr tablet Take 150 mg by mouth daily.   [DISCONTINUED] digoxin (LANOXIN) 0.125 MG tablet Take 125 mcg by mouth every other day.   [DISCONTINUED] diltiazem (CARDIZEM CD) 240 MG 24 hr capsule Take 240 mg by mouth daily.   [DISCONTINUED] ELIQUIS 5 MG TABS tablet Take 5 mg by mouth 2 (two) times daily.   [DISCONTINUED] famotidine (PEPCID) 20 MG tablet Take 20 mg by mouth 2 (two) times daily.   [DISCONTINUED] losartan (COZAAR) 100 MG tablet Take 100 mg by mouth daily.   [DISCONTINUED] metoprolol succinate (TOPROL-XL) 25 MG 24 hr tablet Take 25 mg by mouth 2 (two) times daily.   [DISCONTINUED] metoprolol tartrate (LOPRESSOR) 25 MG tablet Take 25 mg by mouth 2 (two) times daily.   [DISCONTINUED] montelukast (SINGULAIR) 10 MG tablet Take 10 mg by mouth daily.   [DISCONTINUED] TIADYLT ER 240 MG 24 hr capsule Take by mouth daily.   albuterol (VENTOLIN HFA) 108 (90 Base) MCG/ACT inhaler Inhale 2 puffs into the lungs every 4 (four) hours as needed for wheezing.   budesonide-formoterol (SYMBICORT) 160-4.5 MCG/ACT inhaler Inhale 2 puffs into the lungs 2 (two) times daily.   diltiazem (CARDIZEM CD) 240 MG 24 hr capsule Take 1 capsule (240 mg total) by mouth daily.   ELIQUIS 5 MG TABS tablet Take 1 tablet (5 mg total) by mouth 2 (two) times daily.   famotidine (PEPCID) 20  MG tablet Take 1 tablet (20 mg total) by mouth 2 (two) times daily.   losartan (COZAAR) 100 MG tablet Take 1 tablet (100 mg total) by mouth daily.   metoprolol succinate (TOPROL-XL) 25 MG 24 hr tablet Take 1 tablet (25 mg total) by mouth 2 (two) times daily.   montelukast (SINGULAIR) 10 MG tablet Take 1 tablet (10 mg total) by mouth daily.   No facility-administered encounter medications on file as of 04/23/2022.    Past Surgical History:  Procedure Laterality Date   ABLATION     APPENDECTOMY      DILATION AND CURETTAGE OF UTERUS     NASAL SINUS SURGERY      Review of Systems  Constitutional:  Negative for chills and fever.  Respiratory:  Negative for shortness of breath.   Cardiovascular:  Negative for chest pain.  Gastrointestinal:  Negative for abdominal pain, nausea and vomiting.  Musculoskeletal:  Positive for joint pain.  Skin:  Positive for rash. Negative for itching.  Neurological:  Negative for dizziness and headaches.  Psychiatric/Behavioral:  The patient is not nervous/anxious.       Objective    BP 128/70   Pulse 92   Ht '5\' 2"'$  (1.575 m)   Wt 292 lb (132.5 kg)   SpO2 95%   BMI 53.41 kg/m   Physical Exam Constitutional:      Appearance: She is obese.  Cardiovascular:     Rate and Rhythm: Normal rate.     Pulses: Normal pulses.  Pulmonary:     Effort: Pulmonary effort is normal.     Breath sounds: Normal breath sounds.  Abdominal:     General: Bowel sounds are normal. There is no distension.     Palpations: Abdomen is soft.  Musculoskeletal:        General: Swelling and tenderness present.     Right lower leg: Swelling and tenderness present.     Left lower leg: Swelling and tenderness present.  Skin:    General: Skin is warm and dry.     Capillary Refill: Capillary refill takes less than 2 seconds.  Neurological:     Mental Status: She is alert.     Coordination: Coordination abnormal.     Gait: Gait abnormal.       Assessment & Plan:  Cellulitis of lower extremity, unspecified laterality -     Cephalexin; Take 1 capsule (500 mg total) by mouth 4 (four) times daily.  Dispense: 26 capsule; Refill: 0 -     AMB referral to wound care center  Chronic pain of both knees Assessment & Plan: Patient states chronic knee pain, has taken tramadol 50 mg with no relief. Referral to pain management placed.  Orders: -     Ambulatory referral to Pain Clinic  Rash -     Ambulatory referral to Dermatology  Screening for osteoporosis -     HM DEXA  SCAN  Primary hypertension -     CBC with Differential/Platelet -     CMP14+EGFR -     Microalbumin / creatinine urine ratio  Hyperlipidemia, unspecified hyperlipidemia type -     Lipid panel  IFG (impaired fasting glucose) -     Hemoglobin A1c  Need for hepatitis C screening test -     Hepatitis C antibody  Encounter for screening for HIV -     HIV Antibody (routine testing w rflx)  Hypothyroidism, unspecified type -     TSH + free T4  Cellulitis of  lower leg Assessment & Plan: Cellulitis noted on patient's shins. Erythema, warm and tender to touch,swelling noted Prescribed Keflex 500 mg, referral to wound care specialist   Other orders -     Albuterol Sulfate HFA; Inhale 2 puffs into the lungs every 4 (four) hours as needed for wheezing.  Dispense: 18 g; Refill: 3 -     Budesonide-Formoterol Fumarate; Inhale 2 puffs into the lungs 2 (two) times daily.  Dispense: 3 each; Refill: 3 -     Famotidine; Take 1 tablet (20 mg total) by mouth 2 (two) times daily.  Dispense: 45 tablet; Refill: 3 -     dilTIAZem HCl ER Coated Beads; Take 1 capsule (240 mg total) by mouth daily.  Dispense: 30 capsule; Refill: 2 -     Eliquis; Take 1 tablet (5 mg total) by mouth 2 (two) times daily.  Dispense: 60 tablet; Refill: 2 -     Losartan Potassium; Take 1 tablet (100 mg total) by mouth daily.  Dispense: 30 tablet; Refill: 2 -     Metoprolol Succinate ER; Take 1 tablet (25 mg total) by mouth 2 (two) times daily.  Dispense: 60 tablet; Refill: 2 -     Montelukast Sodium; Take 1 tablet (10 mg total) by mouth daily.  Dispense: 30 tablet; Refill: 2    No follow-ups on file.   Renard Hamper Ria Comment, FNP

## 2022-04-27 NOTE — Assessment & Plan Note (Signed)
Patient states chronic knee pain, has taken tramadol 50 mg with no relief. Referral to pain management placed.

## 2022-05-01 ENCOUNTER — Encounter: Payer: Self-pay | Admitting: Cardiology

## 2022-05-01 DIAGNOSIS — N184 Chronic kidney disease, stage 4 (severe): Secondary | ICD-10-CM

## 2022-05-05 DIAGNOSIS — Z9289 Personal history of other medical treatment: Secondary | ICD-10-CM | POA: Diagnosis not present

## 2022-05-05 DIAGNOSIS — N184 Chronic kidney disease, stage 4 (severe): Secondary | ICD-10-CM | POA: Diagnosis not present

## 2022-05-05 DIAGNOSIS — Z6841 Body Mass Index (BMI) 40.0 and over, adult: Secondary | ICD-10-CM | POA: Diagnosis not present

## 2022-05-05 DIAGNOSIS — Z1159 Encounter for screening for other viral diseases: Secondary | ICD-10-CM | POA: Diagnosis not present

## 2022-05-05 DIAGNOSIS — E039 Hypothyroidism, unspecified: Secondary | ICD-10-CM | POA: Diagnosis not present

## 2022-05-05 DIAGNOSIS — Z124 Encounter for screening for malignant neoplasm of cervix: Secondary | ICD-10-CM | POA: Diagnosis not present

## 2022-05-05 DIAGNOSIS — E785 Hyperlipidemia, unspecified: Secondary | ICD-10-CM | POA: Diagnosis not present

## 2022-05-05 DIAGNOSIS — N183 Chronic kidney disease, stage 3 unspecified: Secondary | ICD-10-CM | POA: Diagnosis not present

## 2022-05-05 DIAGNOSIS — Z8542 Personal history of malignant neoplasm of other parts of uterus: Secondary | ICD-10-CM | POA: Diagnosis not present

## 2022-05-05 DIAGNOSIS — I10 Essential (primary) hypertension: Secondary | ICD-10-CM | POA: Diagnosis not present

## 2022-05-05 DIAGNOSIS — Z114 Encounter for screening for human immunodeficiency virus [HIV]: Secondary | ICD-10-CM | POA: Diagnosis not present

## 2022-05-05 DIAGNOSIS — I480 Paroxysmal atrial fibrillation: Secondary | ICD-10-CM | POA: Diagnosis not present

## 2022-05-05 DIAGNOSIS — R7301 Impaired fasting glucose: Secondary | ICD-10-CM | POA: Diagnosis not present

## 2022-05-06 LAB — CMP14+EGFR
ALT: 11 IU/L (ref 0–32)
AST: 11 IU/L (ref 0–40)
Albumin/Globulin Ratio: 2.1 (ref 1.2–2.2)
Albumin: 4.1 g/dL (ref 3.9–4.9)
Alkaline Phosphatase: 131 IU/L — ABNORMAL HIGH (ref 44–121)
BUN/Creatinine Ratio: 16 (ref 12–28)
BUN: 13 mg/dL (ref 8–27)
Bilirubin Total: 0.4 mg/dL (ref 0.0–1.2)
CO2: 24 mmol/L (ref 20–29)
Calcium: 8.8 mg/dL (ref 8.7–10.3)
Chloride: 104 mmol/L (ref 96–106)
Creatinine, Ser: 0.8 mg/dL (ref 0.57–1.00)
Globulin, Total: 2 g/dL (ref 1.5–4.5)
Glucose: 131 mg/dL — ABNORMAL HIGH (ref 70–99)
Potassium: 4.4 mmol/L (ref 3.5–5.2)
Sodium: 143 mmol/L (ref 134–144)
Total Protein: 6.1 g/dL (ref 6.0–8.5)
eGFR: 81 mL/min/{1.73_m2} (ref >59)

## 2022-05-06 LAB — CBC WITH DIFFERENTIAL/PLATELET
Basophils Absolute: 0 10*3/uL (ref 0.0–0.2)
Basos: 1 %
EOS (ABSOLUTE): 0.3 10*3/uL (ref 0.0–0.4)
Eos: 3 %
Hematocrit: 44.5 % (ref 34.0–46.6)
Hemoglobin: 15.2 g/dL (ref 11.1–15.9)
Immature Grans (Abs): 0 10*3/uL (ref 0.0–0.1)
Immature Granulocytes: 0 %
Lymphocytes Absolute: 1.1 10*3/uL (ref 0.7–3.1)
Lymphs: 15 %
MCH: 30.4 pg (ref 26.6–33.0)
MCHC: 34.2 g/dL (ref 31.5–35.7)
MCV: 89 fL (ref 79–97)
Monocytes Absolute: 0.3 10*3/uL (ref 0.1–0.9)
Monocytes: 4 %
Neutrophils Absolute: 5.9 10*3/uL (ref 1.4–7.0)
Neutrophils: 77 %
Platelets: 275 10*3/uL (ref 150–450)
RBC: 5 x10E6/uL (ref 3.77–5.28)
RDW: 12.7 % (ref 11.7–15.4)
WBC: 7.7 10*3/uL (ref 3.4–10.8)

## 2022-05-06 LAB — HIV ANTIBODY (ROUTINE TESTING W REFLEX): HIV Screen 4th Generation wRfx: NONREACTIVE

## 2022-05-06 LAB — LIPID PANEL
Chol/HDL Ratio: 4.6 ratio — ABNORMAL HIGH (ref 0.0–4.4)
Cholesterol, Total: 173 mg/dL (ref 100–199)
HDL: 38 mg/dL — ABNORMAL LOW (ref >39)
LDL Chol Calc (NIH): 106 mg/dL — ABNORMAL HIGH (ref 0–99)
Triglycerides: 164 mg/dL — ABNORMAL HIGH (ref 0–149)
VLDL Cholesterol Cal: 29 mg/dL (ref 5–40)

## 2022-05-06 LAB — HEMOGLOBIN A1C
Est. average glucose Bld gHb Est-mCnc: 148 mg/dL
Hgb A1c MFr Bld: 6.8 % — ABNORMAL HIGH (ref 4.8–5.6)

## 2022-05-06 LAB — TSH+FREE T4
Free T4: 0.98 ng/dL (ref 0.82–1.77)
TSH: 1.78 u[IU]/mL (ref 0.450–4.500)

## 2022-05-06 LAB — HEPATITIS C ANTIBODY: Hep C Virus Ab: NONREACTIVE

## 2022-05-07 ENCOUNTER — Other Ambulatory Visit: Payer: Self-pay

## 2022-05-07 LAB — MICROALBUMIN / CREATININE URINE RATIO
Creatinine, Urine: 93.8 mg/dL
Microalb/Creat Ratio: 6 mg/g creat (ref 0–29)
Microalbumin, Urine: 6 ug/mL

## 2022-05-08 ENCOUNTER — Encounter: Payer: Self-pay | Admitting: Family Medicine

## 2022-05-08 ENCOUNTER — Other Ambulatory Visit: Payer: Self-pay | Admitting: Family Medicine

## 2022-05-08 DIAGNOSIS — E11628 Type 2 diabetes mellitus with other skin complications: Secondary | ICD-10-CM

## 2022-05-08 MED ORDER — METFORMIN HCL 500 MG PO TABS: 500.0000 mg | ORAL_TABLET | Freq: Every day | ORAL | 2 refills | Status: DC

## 2022-05-08 NOTE — Progress Notes (Signed)
Spoke to patient, cholesterol levels slightly elevated, hemoglobin A1c 6.8, started patient on metformin 500 mg as initial therapy.  Encouraged to start lifestyle modifications follow diet low in saturated fat, reduce dietary salt intake, avoid fatty foods, maintain an exercise routine 3 to 5 days a week for a minimum total of 150 minutes.  Patient alkaline phosphatase initial blood work slightly elevated 131 we will continue to monitor, patient asymptomatic, follow-up in 2 to 3 months for repeat blood work management of chronic conditions.

## 2022-05-08 NOTE — Progress Notes (Signed)
I Spoke to patient, cholesterol levels slightly elevated, hemoglobin A1c 6.8, started patient on metformin 500 mg as initial therapy.  Encouraged to start lifestyle modifications follow diet low in saturated fat, reduce dietary salt intake, avoid fatty foods, maintain an exercise routine 3 to 5 days a week for a minimum total of 150 minutes.  Patient alkaline phosphatase initial blood work slightly elevated 131 we will continue to monitor, patient asymptomatic, follow-up in 2 to 3 months for repeat blood work management of chronic conditions.

## 2022-05-09 ENCOUNTER — Encounter: Payer: Self-pay | Admitting: Family Medicine

## 2022-05-09 ENCOUNTER — Encounter: Payer: Self-pay | Admitting: Cardiology

## 2022-05-12 ENCOUNTER — Other Ambulatory Visit: Payer: Self-pay | Admitting: Family Medicine

## 2022-05-14 ENCOUNTER — Ambulatory Visit: Payer: Medicare HMO | Admitting: Internal Medicine

## 2022-05-15 ENCOUNTER — Encounter (HOSPITAL_COMMUNITY): Payer: Self-pay | Admitting: *Deleted

## 2022-05-21 ENCOUNTER — Other Ambulatory Visit: Payer: Self-pay | Admitting: Family Medicine

## 2022-05-21 ENCOUNTER — Encounter: Payer: Self-pay | Admitting: Family Medicine

## 2022-05-21 ENCOUNTER — Ambulatory Visit (INDEPENDENT_AMBULATORY_CARE_PROVIDER_SITE_OTHER): Payer: Medicare HMO | Admitting: Family Medicine

## 2022-05-21 ENCOUNTER — Other Ambulatory Visit (HOSPITAL_COMMUNITY)
Admission: RE | Admit: 2022-05-21 | Discharge: 2022-05-21 | Disposition: A | Discharge status: Home / Self Care | Payer: Medicare HMO | Source: Ambulatory Visit | Attending: Family Medicine | Admitting: Family Medicine

## 2022-05-21 VITALS — BP 120/77 | HR 99 | Ht 64.0 in | Wt 297.0 lb

## 2022-05-21 DIAGNOSIS — Z1231 Encounter for screening mammogram for malignant neoplasm of breast: Secondary | ICD-10-CM

## 2022-05-21 DIAGNOSIS — Z124 Encounter for screening for malignant neoplasm of cervix: Secondary | ICD-10-CM | POA: Diagnosis not present

## 2022-05-21 DIAGNOSIS — Z01419 Encounter for gynecological examination (general) (routine) without abnormal findings: Secondary | ICD-10-CM

## 2022-05-21 MED ORDER — METFORMIN HCL ER 500 MG PO TB24: 500.0000 mg | ORAL_TABLET | Freq: Every day | ORAL | 2 refills | Status: DC

## 2022-05-21 MED ORDER — ROSUVASTATIN CALCIUM 5 MG PO TABS: 5.0000 mg | ORAL_TABLET | Freq: Every day | ORAL | 3 refills | Status: DC

## 2022-05-21 NOTE — Progress Notes (Signed)
Patient Office Visit   Subjective   Patient ID: Alexis [name], female    DOB: September 28, 1955  Age: 67 y.o. MRN: [id number]  CC:  Chief Complaint  Patient presents with   Gynecologic Exam    HPI Alexis [name] 67 year old female, presents to the clinic for a pap smear routine visit. She  has a past medical history of Asthma (01/21/2013), Endometrial cancer (Alexis [name]) (01/21/2013), HTN (hypertension), Morbid obesity (Alexis [name]), Paroxysmal A-fib (Alexis [name]), and Uterine cancer (Alexis [name]).  Gynecologic Exam The patient's pertinent negatives include no genital itching, genital lesions, pelvic pain or vaginal discharge. Pertinent negatives include no dysuria, frequency, hematuria or urgency.      Outpatient Encounter Medications as of 05/21/2022  Medication Sig   albuterol (VENTOLIN HFA) 108 (90 Base) MCG/ACT inhaler Inhale 2 puffs into the lungs every 4 (four) hours as needed for wheezing.   budesonide-formoterol (SYMBICORT) 160-4.5 MCG/ACT inhaler Inhale 2 puffs into the lungs 2 (two) times daily.   buPROPion (WELLBUTRIN XL) 150 MG 24 hr tablet Take 150 mg by mouth daily.   Calcium Carbonate (CALCIUM 600 PO) Take by mouth. 2 chewables by mouth daily- Bariatric fusion   cephALEXin (KEFLEX) 500 MG capsule Take 1 capsule (500 mg total) by mouth 4 (four) times daily.   Cholecalciferol 1.25 MG (50000 UT) capsule Take by mouth.   COMIRNATY syringe    digoxin (LANOXIN) 0.25 MG tablet Take 250 mcg by mouth daily.   diltiazem (CARDIZEM CD) 240 MG 24 hr capsule Take 1 capsule (240 mg total) by mouth daily.   ELIQUIS 5 MG TABS tablet Take 1 tablet (5 mg total) by mouth 2 (two) times daily.   famotidine (PEPCID) 20 MG tablet Take 1 tablet (20 mg total) by mouth 2 (two) times daily.   FLUAD QUADRIVALENT 0.5 ML injection    fluticasone (FLONASE) 50 MCG/ACT nasal spray Place 2 sprays daily as needed into both nostrils for allergies or rhinitis.   furosemide (LASIX) 40 MG tablet Take 40 mg by mouth as needed.   losartan  (COZAAR) 100 MG tablet Take 1 tablet (100 mg total) by mouth daily.   metoprolol succinate (TOPROL-XL) 25 MG 24 hr tablet Take 1 tablet (25 mg total) by mouth 2 (two) times daily.   montelukast (SINGULAIR) 10 MG tablet Take 1 tablet (10 mg total) by mouth daily.   Multiple Vitamin (MULTIVITAMIN) tablet Take 1 tablet by mouth daily. Bariatric fusion   TIADYLT ER 240 MG 24 hr capsule Take by mouth daily.   traMADol (ULTRAM) 50 MG tablet Take 50 mg by mouth 3 (three) times daily as needed.   [DISCONTINUED] metFORMIN (GLUCOPHAGE) 500 MG tablet Take 1 tablet (500 mg total) by mouth daily with breakfast.   No facility-administered encounter medications on file as of 05/21/2022.    Past Surgical History:  Procedure Laterality Date   ABLATION     APPENDECTOMY     DILATION AND CURETTAGE OF UTERUS     NASAL SINUS SURGERY      Review of Systems  Genitourinary:  Negative for dysuria, frequency, hematuria, pelvic pain, urgency and vaginal discharge.      Objective    BP 120/77   Pulse 99   Ht 5' 4"$  (1.626 m)   Wt 297 lb (134.7 kg)   SpO2 95%   BMI 50.98 kg/m   Physical Exam Constitutional:      Appearance: She is obese.  Cardiovascular:     Rate and Rhythm: Normal rate.  Pulmonary:  Effort: Pulmonary effort is normal.  Genitourinary:    General: Normal vulva.     Vagina: No vaginal discharge.     Rectum: Normal.  Skin:    General: Skin is warm and dry.     Capillary Refill: Capillary refill takes less than 2 seconds.  Neurological:     General: No focal deficit present.     Mental Status: She is alert and oriented to person, place, and time.  Psychiatric:        Mood and Affect: Mood normal.        Thought Content: Thought content normal.       Assessment & Plan:  Pap smear for cervical cancer screening Assessment & Plan: Pap smear done, no rashes, no lesions, no erythema noted on vulva area Breasts appear normal, no suspicious masses, no skin or nipple changes or  axillary nodes.   Orders: -     Cytology - PAP  Screening mammogram, encounter for -     Digital Screening Mammogram, Left and Right; Future  Return in about 3 months (around 08/19/2022) for chronic follow-up , diabetes, hypertension, Hyperlipidema.   Renard Hamper Ria Comment, FNP

## 2022-05-21 NOTE — Patient Instructions (Signed)
It was pleasure meeting with you today. Please take medications as prescribed. Follow up with your primary health provider if any health concerns arises. Follow up in 3 months for Diabetes, Hyperlipidemia, Hypertension management

## 2022-05-21 NOTE — Assessment & Plan Note (Addendum)
Pap smear done, no rashes, no lesions, no erythema noted on vulva area Breasts appear normal, no suspicious masses, no skin or nipple changes or axillary nodes.

## 2022-05-21 NOTE — Progress Notes (Deleted)
   Patient Office Visit   Subjective   Patient ID: Alexis [name], female    DOB: July 25, 1955  Age: 67 y.o. MRN: [id number]  CC: No chief complaint on file.   HPI Alexis [name] presents to establish care. She  has a past medical history of Asthma (01/21/2013), Endometrial cancer ([place]) (01/21/2013), HTN (hypertension), Morbid obesity ([place]), Paroxysmal A-fib ([place]), and Uterine cancer ([place]).  HPI  ***  Outpatient Encounter Medications as of 05/21/2022  Medication Sig   albuterol (VENTOLIN HFA) 108 (90 Base) MCG/ACT inhaler Inhale 2 puffs into the lungs every 4 (four) hours as needed for wheezing.   budesonide-formoterol (SYMBICORT) 160-4.5 MCG/ACT inhaler Inhale 2 puffs into the lungs 2 (two) times daily.   Calcium Carbonate (CALCIUM 600 PO) Take by mouth. 2 chewables by mouth daily- Bariatric fusion   cephALEXin (KEFLEX) 500 MG capsule Take 1 capsule (500 mg total) by mouth 4 (four) times daily.   Cholecalciferol 1.25 MG (50000 UT) capsule Take by mouth.   digoxin (LANOXIN) 0.25 MG tablet Take 250 mcg by mouth daily.   diltiazem (CARDIZEM CD) 240 MG 24 hr capsule Take 1 capsule (240 mg total) by mouth daily.   ELIQUIS 5 MG TABS tablet Take 1 tablet (5 mg total) by mouth 2 (two) times daily.   famotidine (PEPCID) 20 MG tablet Take 1 tablet (20 mg total) by mouth 2 (two) times daily.   FLUAD QUADRIVALENT 0.5 ML injection    fluticasone (FLONASE) 50 MCG/ACT nasal spray Place 2 sprays daily as needed into both nostrils for allergies or rhinitis.   furosemide (LASIX) 40 MG tablet Take 40 mg by mouth as needed.   losartan (COZAAR) 100 MG tablet Take 1 tablet (100 mg total) by mouth daily.   metFORMIN (GLUCOPHAGE) 500 MG tablet Take 1 tablet (500 mg total) by mouth daily with breakfast.   metoprolol succinate (TOPROL-XL) 25 MG 24 hr tablet Take 1 tablet (25 mg total) by mouth 2 (two) times daily.   montelukast (SINGULAIR) 10 MG tablet Take 1 tablet (10 mg total) by mouth daily.   Multiple  Vitamin (MULTIVITAMIN) tablet Take 1 tablet by mouth daily. Bariatric fusion   traMADol (ULTRAM) 50 MG tablet Take 50 mg by mouth 3 (three) times daily as needed.   No facility-administered encounter medications on file as of 05/21/2022.    Past Surgical History:  Procedure Laterality Date   ABLATION     APPENDECTOMY     DILATION AND CURETTAGE OF UTERUS     NASAL SINUS SURGERY      ROS    Objective    There were no vitals taken for this visit.  Physical Exam    Assessment & Plan:  There are no diagnoses linked to this encounter.  No follow-ups on file.   Renard Hamper Ria Comment, FNP

## 2022-05-23 LAB — CYTOLOGY - PAP
Adequacy: ABSENT
Diagnosis: NEGATIVE

## 2022-05-27 ENCOUNTER — Encounter: Payer: Self-pay | Admitting: Family Medicine

## 2022-05-28 ENCOUNTER — Other Ambulatory Visit: Payer: Self-pay | Admitting: Family Medicine

## 2022-05-28 DIAGNOSIS — E11622 Type 2 diabetes mellitus with other skin ulcer: Secondary | ICD-10-CM

## 2022-05-28 MED ORDER — RYBELSUS 7 MG PO TABS: 7.0000 mg | ORAL_TABLET | Freq: Every day | ORAL | 2 refills | Status: DC

## 2022-06-04 ENCOUNTER — Ambulatory Visit: Payer: Medicare HMO | Admitting: Internal Medicine

## 2022-06-09 ENCOUNTER — Encounter: Payer: Self-pay | Admitting: Family Medicine

## 2022-06-18 ENCOUNTER — Other Ambulatory Visit: Payer: Self-pay | Admitting: Family Medicine

## 2022-06-19 ENCOUNTER — Encounter: Payer: Self-pay | Admitting: Cardiology

## 2022-06-19 MED ORDER — DIGOXIN 125 MCG PO TABS: 0.1250 mg | ORAL_TABLET | ORAL | 0 refills | Status: DC

## 2022-06-19 NOTE — Telephone Encounter (Signed)
Please let patient know her cardiologist need to refill digoxin

## 2022-06-23 ENCOUNTER — Ambulatory Visit: Payer: Medicare HMO | Admitting: Physician Assistant

## 2022-06-30 ENCOUNTER — Ambulatory Visit: Payer: Medicare HMO | Admitting: Physician Assistant

## 2022-07-03 ENCOUNTER — Encounter: Payer: Medicare HMO | Admitting: Physician Assistant

## 2022-07-06 ENCOUNTER — Other Ambulatory Visit: Payer: Self-pay | Admitting: Family Medicine

## 2022-07-10 ENCOUNTER — Encounter: Payer: Self-pay | Admitting: Family Medicine

## 2022-07-11 MED ORDER — LEVOCETIRIZINE DIHYDROCHLORIDE 5 MG PO TABS: 5.0000 mg | ORAL_TABLET | Freq: Every evening | ORAL | 1 refills | Status: DC

## 2022-08-04 ENCOUNTER — Other Ambulatory Visit (HOSPITAL_COMMUNITY): Payer: Medicare HMO

## 2022-08-04 ENCOUNTER — Ambulatory Visit (HOSPITAL_COMMUNITY): Payer: Medicare HMO

## 2022-08-09 ENCOUNTER — Other Ambulatory Visit: Payer: Self-pay | Admitting: Cardiology

## 2022-08-09 ENCOUNTER — Other Ambulatory Visit: Payer: Self-pay | Admitting: Family Medicine

## 2022-08-11 ENCOUNTER — Encounter: Payer: Self-pay | Admitting: Cardiology

## 2022-08-11 ENCOUNTER — Other Ambulatory Visit: Payer: Self-pay | Admitting: Family Medicine

## 2022-08-11 ENCOUNTER — Other Ambulatory Visit: Payer: Self-pay | Admitting: Cardiology

## 2022-08-11 NOTE — Telephone Encounter (Signed)
Cardiology will need to refill this medication

## 2022-08-12 ENCOUNTER — Other Ambulatory Visit: Payer: Self-pay

## 2022-08-12 ENCOUNTER — Telehealth: Payer: Self-pay | Admitting: Cardiology

## 2022-08-12 ENCOUNTER — Other Ambulatory Visit: Payer: Self-pay | Admitting: *Deleted

## 2022-08-12 DIAGNOSIS — I480 Paroxysmal atrial fibrillation: Secondary | ICD-10-CM

## 2022-08-12 MED ORDER — DILTIAZEM HCL ER COATED BEADS 240 MG PO CP24: 240.0000 mg | ORAL_CAPSULE | Freq: Every day | ORAL | 1 refills | Status: DC

## 2022-08-12 MED ORDER — DIGOXIN 125 MCG PO TABS: 0.1250 mg | ORAL_TABLET | ORAL | 1 refills | Status: DC

## 2022-08-12 MED ORDER — ELIQUIS 5 MG PO TABS
5.0000 mg | ORAL_TABLET | Freq: Two times a day (BID) | ORAL | 1 refills | Status: DC
Start: 2022-08-12 — End: 2022-08-26

## 2022-08-12 NOTE — Telephone Encounter (Signed)
Called pt after receiving this message for warfarin 6mg  refill request. According to the pt's med list she is NOT on warfarin and on eliquis therefore needed to clarify this. Called the pt and inquired if she was taking warfarin since we received the request and she stated she is NOT taking warfarin but taking eliquis.   Advised that since I have now clarified the correct medication that she is taking, which is eliquis 5mg  twice a day, I can refill that. Also, confirmed the pharmacy as well.

## 2022-08-12 NOTE — Telephone Encounter (Signed)
*  STAT* If patient is at the pharmacy, call can be transferred to refill team.   1. Which medications need to be refilled? (please list name of each medication and dose if known)   warfarin (COUMADIN) 6 MG tablet   2. Which pharmacy/location (including street and city if local pharmacy) is medication to be sent to?  [id number] - [place], [place] - [address]   3. Do they need a 30 day or 90 day supply?  90 day   Patient stated she is completely out of this medication for the last 2 days.

## 2022-08-12 NOTE — Telephone Encounter (Signed)
Eliquis 5mg  refill request received. Patient is 67 years old, weight-134.7kg, Crea-0.80 on 05/05/22, Diagnosis-Afib, and last seen by Dr. Anne Fu on 01/04/23. Dose is appropriate based on dosing criteria. Will send in refill to requested pharmacy.

## 2022-08-13 NOTE — Progress Notes (Signed)
Cardiology Office Note:    Date:  08/15/2022   ID:  Alexis [name], DOB 11-18-1955, MRN [id number]  PCP:  Rica Records, FNP   [facility] Providers Cardiologist:  None     Referring MD: Wylene Men*   Chief Complaint:   History of Present Illness:    Alexis [name] is a  67 y.o. female with a hx of atrial fibrillation on chronic anticoagulation, obesity, bariatric surgery, HTN, mitral regurgitation,   She has been followed by A-fib clinic and was referred to general cardiology and seen by Dr. Anne Fu on 01/03/2022.  He had previously seen her in 2016.  History of at least 15 prior cardioversions.  She is occasionally symptomatic noticing HR as high as 100 to 112 bpm.  Previously had throbbing in her chest because of her sleeping position but this had not occurred recently.  In 2022 she had bariatric surgery and successfully lost 100 pounds.  She recently regained 12 pounds which she attributes to the stress of losing her mother.  Just after bariatric surgery she immediately went into atrial fibrillation and was admitted for observation.  She was started on digoxin at that time. Reported she preferred to avoid a fib ablation.  At that visit, digoxin level was high at 1.4.  Dr. Anne Fu advised her to reduce digoxin to 0.125 mg every other day and repeat level in 1 month.  Today, she is here for follow-up.  Digoxin level      Past Medical History:  Diagnosis Date  . Asthma 01/21/2013   Dr. Sherene Sires  . Endometrial cancer (H[facility]) 01/21/2013   Dr. Clifton James, stage I-10/14  . HTN (hypertension)   . Morbid obesity (H[facility])   . Paroxysmal A-fib ([facility])   . Uterine cancer [facility])     Past Surgical History:  Procedure Laterality Date  . ABLATION    . APPENDECTOMY    . DILATION AND CURETTAGE OF UTERUS    . NASAL SINUS SURGERY      Current Medications: No outpatient medications have been marked as taking for the 08/14/22 encounter (Office Visit) with Lissa Hoard, Zachary George, NP.      Allergies:   Ace inhibitors, Ciprofloxacin, Cocoa, Flavoring agent, Prednisone, Sulfa antibiotics, and Sulfamethoxazole   Social History   Socioeconomic History  . Marital status: Married    Spouse name: Not on file  . Number of children: Not on file  . Years of education: Not on file  . Highest education level: Not on file  Occupational History  . Not on file  Tobacco Use  . Smoking status: Never  . Smokeless tobacco: Never  Substance and Sexual Activity  . Alcohol use: No  . Drug use: Not on file  . Sexual activity: Not on file  Other Topics Concern  . Not on file  Social History Narrative  . Not on file   Social Determinants of Health   Financial Resource Strain: Not on file  Food Insecurity: Not on file  Transportation Needs: Not on file  Physical Activity: Not on file  Stress: Not on file  Social Connections: Not on file     Family History: The patient's family history includes Diabetes in her father; Heart attack in her father; Hyperlipidemia in her unknown relative; Hypertension in her father.  ROS:   Please see the history of present illness.    All other systems reviewed and are negative.  Labs/Other Studies Reviewed:    The following studies were reviewed today:  Echo 12/17/21 1. Left ventricular ejection fraction, by estimation, is 55 to 60%. The  left ventricle has normal function. The left ventricle has no regional  wall motion abnormalities. Left ventricular diastolic parameters are  indeterminate.   2. Right ventricular systolic function is low normal. The right  ventricular size is normal. There is normal pulmonary artery systolic  pressure. The estimated right ventricular systolic pressure is 27.4 mmHg.   3. The mitral valve is grossly normal. Mild mitral valve regurgitation.   4. The aortic valve is tricuspid. Aortic valve regurgitation is not  visualized. Aortic valve sclerosis is present, with no evidence of aortic  valve stenosis.  Aortic valve mean gradient measures 3.0 mmHg.   5. The inferior vena cava is dilated in size with <50% respiratory  variability, suggesting right atrial pressure of 15 mmHg.   Comparison(s): Prior images unable to be directly viewed. Recent Labs: 05/05/2022: ALT 11; BUN 13; Creatinine, Ser 0.80; Hemoglobin 15.2; Platelets 275; Potassium 4.4; Sodium 143; TSH 1.780  Recent Lipid Panel    Component Value Date/Time   CHOL 173 05/05/2022 1122   TRIG 164 (H) 05/05/2022 1122   HDL 38 (L) 05/05/2022 1122   CHOLHDL 4.6 (H) 05/05/2022 1122   LDLCALC 106 (H) 05/05/2022 1122     Risk Assessment/Calculations:    CHA2DS2-VASc Score = 4   This indicates a 4.8% annual risk of stroke. The patient's score is based upon: CHF History: 0 HTN History: 1 Diabetes History: 1 Stroke History: 0 Vascular Disease History: 0 Age Score: 1 Gender Score: 1          Physical Exam:    VS:  There were no vitals taken for this visit.    Wt Readings from Last 3 Encounters:  05/21/22 297 lb (134.7 kg)  04/23/22 292 lb (132.5 kg)  01/03/22 288 lb (130.6 kg)     GEN:  Well nourished, well developed in no acute distress HEENT: Normal NECK: No JVD; No carotid bruits CARDIAC: RRR, no murmurs, rubs, gallops RESPIRATORY:  Clear to auscultation without rales, wheezing or rhonchi  ABDOMEN: Soft, non-tender, non-distended MUSCULOSKELETAL:  No edema; No deformity.  pedal pulses, bilaterally SKIN: Warm and dry NEUROLOGIC:  Alert and oriented x 3 PSYCHIATRIC:  Normal affect   EKG:  EKG is  ordered today.  The ekg ordered today demonstrates      Diagnoses:    1. Erroneous encounter - disregard    Assessment and Plan:     Persistent atrial fibrillation on chronic anticoagulation:  Hypertension:  Mitral regurgitation: Mild MR on echo 12/17/21  Medication management: Digoxin level  Morbid obesity:     Disposition:  Medication Adjustments/Labs and Tests Ordered: Current medicines are  reviewed at length with the patient today.  Concerns regarding medicines are outlined above.  No orders of the defined types were placed in this encounter.  No orders of the defined types were placed in this encounter.   There are no Patient Instructions on file for this visit.   Signed, Levi Aland, NP  08/15/2022 7:55 AM    [facility] This encounter was created in error - please disregard.

## 2022-08-14 ENCOUNTER — Ambulatory Visit: Payer: Medicare HMO | Attending: Nurse Practitioner | Admitting: Nurse Practitioner

## 2022-08-17 ENCOUNTER — Other Ambulatory Visit: Payer: Self-pay | Admitting: Family Medicine

## 2022-08-18 ENCOUNTER — Telehealth: Payer: Self-pay | Admitting: Cardiology

## 2022-08-18 NOTE — Telephone Encounter (Signed)
  Per MyChart scheduling message:  AFIB and discuss possibly having the Watchman procedure. My Eliquis has become to expensive.

## 2022-08-18 NOTE — Telephone Encounter (Signed)
Pt called back per message received in [facility] Triage.    Pt stated she missed her 5/9 appointment with Eligha Bridegroom NP.   Pt states she is in intermittent AFIB, and requests a clinic appointment to discuss this, and inquired about the Watchman procedure.  Pt also states she has been off of her Eliquis for 1 week, and wanted a Patient Assistance form to complete and return to [facility].    Dr. Anne Fu consulted on Pt being off Eliquis for 1 week, and will be able to pay / pick up Eliquis from her pharmacy on Wed, 5/15 per Pt.  Dr. Anne Fu stated that is fine to restart Eliquis on 5/15, and no ASA or additional anticoag therapy needed between now and then.    Pt scheduled to see Jari Favre PA-C on Tuesday 5/21 at 245 pm, as soonest appointment available to discuss Intermittent AFIB.  At times Pt HR between mid 60's to low 100's.  It does not stay elevated.    Pt advised I will mail her a PAF form for Eliquis, and advised to return to [facility]care for processing.  Pt verbalized understanding to Plan of care, and will see Ms Vertell Novak on 5/21.

## 2022-08-19 NOTE — Patient Instructions (Signed)

## 2022-08-20 ENCOUNTER — Encounter: Payer: Medicare HMO | Admitting: Family Medicine

## 2022-08-20 DIAGNOSIS — E559 Vitamin D deficiency, unspecified: Secondary | ICD-10-CM

## 2022-08-20 DIAGNOSIS — Z13220 Encounter for screening for lipoid disorders: Secondary | ICD-10-CM

## 2022-08-20 DIAGNOSIS — E119 Type 2 diabetes mellitus without complications: Secondary | ICD-10-CM

## 2022-08-20 NOTE — Progress Notes (Signed)
NO SHOW APPOINTMENT

## 2022-08-26 ENCOUNTER — Telehealth: Payer: Self-pay | Admitting: *Deleted

## 2022-08-26 ENCOUNTER — Encounter: Payer: Self-pay | Admitting: Physician Assistant

## 2022-08-26 ENCOUNTER — Ambulatory Visit: Payer: Medicare HMO | Attending: Physician Assistant | Admitting: Physician Assistant

## 2022-08-26 VITALS — BP 108/64 | HR 129 | Ht 64.0 in | Wt 294.8 lb

## 2022-08-26 DIAGNOSIS — I10 Essential (primary) hypertension: Secondary | ICD-10-CM

## 2022-08-26 DIAGNOSIS — I480 Paroxysmal atrial fibrillation: Secondary | ICD-10-CM | POA: Diagnosis not present

## 2022-08-26 DIAGNOSIS — Z79899 Other long term (current) drug therapy: Secondary | ICD-10-CM | POA: Diagnosis not present

## 2022-08-26 DIAGNOSIS — G4733 Obstructive sleep apnea (adult) (pediatric): Secondary | ICD-10-CM

## 2022-08-26 DIAGNOSIS — Z6841 Body Mass Index (BMI) 40.0 and over, adult: Secondary | ICD-10-CM | POA: Diagnosis not present

## 2022-08-26 DIAGNOSIS — I340 Nonrheumatic mitral (valve) insufficiency: Secondary | ICD-10-CM

## 2022-08-26 DIAGNOSIS — D6869 Other thrombophilia: Secondary | ICD-10-CM

## 2022-08-26 MED ORDER — WARFARIN SODIUM 2.5 MG PO TABS: 2.5000 mg | ORAL_TABLET | Freq: Every day | ORAL | 1 refills | Status: DC

## 2022-08-26 MED ORDER — WARFARIN SODIUM 5 MG PO TABS: 5.0000 mg | ORAL_TABLET | Freq: Every day | ORAL | 1 refills | Status: DC

## 2022-08-26 NOTE — Telephone Encounter (Signed)
Called pt after receiving a message that the pt needs a new pt appt with the Anticoagulation Clinic. There was no answer so left a message.

## 2022-08-26 NOTE — Telephone Encounter (Signed)
Spoke with pt and she lives in [place] and is willing to go to [place] office for appts. She is not where she could write things down and asked if I could call her back tomorrow around 10am or 11am and advised will do.   She is aware the warfarin tablets were sent by [facility] and she will get an accurate count on how much eliquis she has left and report when we speak tomorrow.

## 2022-08-26 NOTE — Progress Notes (Addendum)
Office Visit    Patient Name: Alexis [name] Date of Encounter: 08/26/2022  PCP:  Rica Records, FNP   [facility]  Cardiologist:  Donato Schultz, MD  Advanced Practice Provider:  No care team member to display Electrophysiologist:  None   HPI    Alexis [name] is a 67 y.o. female with a past medical history of paroxysmal atrial fibrillation, endometrial cancer, uterine cancer, and hypertension presents today for follow-up visit.  She was seen September 2023 by Dr. Anne Fu.  She continued to have intermittent episodes of atrial fibrillation at that time.  Occasionally noticed her heart rate as high as 110 112 bpm.  Prefers to stay away from alternative treatments like ablation and pacemakers.  She has had at least 15 prior cardioversions.  States that there was a time where her chest would be throbbing because of her sleeping position, this also triggered her atrial fibrillation.  This has not occurred in over a year.  In 2022 she had bariatric surgery and successfully lost 100 pounds.  Recently, had regained about 12 pounds which she mostly attributed to stress and recent loss of her mother.  After that, she moved back from [place] and bought a home with her husband.  Of note just after her surgery she immediately went back in atrial fibrillation and was admitted for observation.  Started on digoxin at that time.  She was on Eliquis and digoxin.  Denies any bleeding issues.  Switched from Eliquis to Xarelto.  No chest pain, shortness of breath, peripheral edema.  No lightheadedness, headaches, syncope, orthopnea, or PND.  Today, she tells me that her digoxin was cut in half last visit because her dig level was too high.  She has not been taking this consistently.  She is also out of several of her medications.  She states her atrial fibrillation rate is high as 150s or 160s.  She does tire easily.  She has been trying to increase her activity level but the  atrial fibrillation makes it hard.  We discussed a management strategy which includes rate control, another DCCV, ablation.  Patient has had 15-20 DCCV's without success.  Is not interested in any invasive procedures such as ablation.  We cannot titrate her medications today due to her blood pressure being low.  We did order a digoxin level.  We also discussed the Watchman procedure since she is having difficulty affording Eliquis.  We have switched her to Coumadin today.  We also discussed inspire and the patient is interested in this procedure.  However, she is not a candidate due to her BMI.  BMI would need to be 40.  Patient is aware that she would need to lose around 60 to 65 pounds to be a candidate for this.  Reports no shortness of breath nor dyspnea on exertion. Reports no chest pain, pressure, or tightness. No edema, orthopnea, PND.    Past Medical History    Past Medical History:  Diagnosis Date   Asthma 01/21/2013   Dr. Sherene Sires   Endometrial cancer [facility]) 01/21/2013   Dr. Clifton James, stage I-10/14   HTN (hypertension)    Morbid obesity (HCC)    Paroxysmal A-fib (HCC)    Uterine cancer (HCC)    Past Surgical History:  Procedure Laterality Date   ABLATION     APPENDECTOMY     DILATION AND CURETTAGE OF UTERUS     NASAL SINUS SURGERY      Allergies  Allergies  Allergen Reactions   Ace Inhibitors     Cough / "congestion"   Ciprofloxacin    Cocoa    Flavoring Agent     Makes mouth numb   Prednisone Other (See Comments)    Raises her blood pressure   Sulfa Antibiotics Nausea And Vomiting    Other reaction(s): Nausea And Vomiting   Sulfamethoxazole Nausea Only     EKGs/Labs/Other Studies Reviewed:   The following studies were reviewed today: Cardiac Studies & Procedures       ECHOCARDIOGRAM  ECHOCARDIOGRAM COMPLETE 12/17/2021  Narrative ECHOCARDIOGRAM REPORT    Patient Name:   Alexis [name] Date of Exam: 12/17/2021 Medical Rec #:  [id number]     Height:        64.0 in Accession #:    [id number]    Weight:       277.6 lb Date of Birth:  10/19/1955     BSA:          2.249 m Patient Age:    66 years      BP:           144/90 mmHg Patient Gender: F             HR:           87 bpm. [facility]  Procedure: 2D Echo, Cardiac Doppler and Color Doppler  Indications:    Atrial fibrillation  History:        Patient has prior history of Echocardiogram examinations, most recent 02/11/2017. Arrythmias:Atrial Fibrillation; Risk Factors:Hypertension.  Sonographer:    Mikki Harbor Referring Phys: [phone] DONNA C CARROLL   Sonographer Comments: Patient is obese. IMPRESSIONS   1. Left ventricular ejection fraction, by estimation, is 55 to 60%. The left ventricle has normal function. The left ventricle has no regional wall motion abnormalities. Left ventricular diastolic parameters are indeterminate. 2. Right ventricular systolic function is low normal. The right ventricular size is normal. There is normal pulmonary artery systolic pressure. The estimated right ventricular systolic pressure is 27.4 mmHg. 3. The mitral valve is grossly normal. Mild mitral valve regurgitation. 4. The aortic valve is tricuspid. Aortic valve regurgitation is not visualized. Aortic valve sclerosis is present, with no evidence of aortic valve stenosis. Aortic valve mean gradient measures 3.0 mmHg. 5. The inferior vena cava is dilated in size with <50% respiratory variability, suggesting right atrial pressure of 15 mmHg.  Comparison(s): Prior images unable to be directly viewed.  FINDINGS Left Ventricle: Left ventricular ejection fraction, by estimation, is 55 to 60%. The left ventricle has normal function. The left ventricle has no regional wall motion abnormalities. The left ventricular internal cavity size was normal in size. There is borderline left ventricular hypertrophy. Left ventricular diastolic function could not be evaluated due to atrial fibrillation.  Left ventricular diastolic parameters are indeterminate.  Right Ventricle: The right ventricular size is normal. No increase in right ventricular wall thickness. Right ventricular systolic function is low normal. There is normal pulmonary artery systolic pressure. The tricuspid regurgitant velocity is 1.76 m/s, and with an assumed right atrial pressure of 15 mmHg, the estimated right ventricular systolic pressure is 27.4 mmHg.  Left Atrium: Left atrial size was normal in size.  Right Atrium: Right atrial size was normal in size.  Pericardium: There is no evidence of pericardial effusion. Presence of epicardial fat layer.  Mitral Valve: The mitral valve is grossly normal. Mild mitral valve regurgitation. MV peak gradient, 6.9 mmHg. The mean mitral valve gradient  is 2.0 mmHg.  Tricuspid Valve: The tricuspid valve is grossly normal. Tricuspid valve regurgitation is trivial.  Aortic Valve: The aortic valve is tricuspid. There is mild aortic valve annular calcification. Aortic valve regurgitation is not visualized. Aortic valve sclerosis is present, with no evidence of aortic valve stenosis. Aortic valve mean gradient measures 3.0 mmHg. Aortic valve peak gradient measures 6.1 mmHg. Aortic valve area, by VTI measures 2.75 cm.  Pulmonic Valve: The pulmonic valve was not well visualized. Pulmonic valve regurgitation is trivial.  Aorta: The aortic root is normal in size and structure.  Venous: The inferior vena cava is dilated in size with less than 50% respiratory variability, suggesting right atrial pressure of 15 mmHg.  IAS/Shunts: No atrial level shunt detected by color flow Doppler.   LEFT VENTRICLE PLAX 2D LVIDd:         4.60 cm   Diastology LVIDs:         3.10 cm   LV e' medial:    9.68 cm/s LV PW:         1.10 cm   LV E/e' medial:  12.5 LV IVS:        1.00 cm   LV e' lateral:   12.50 cm/s LVOT diam:     2.00 cm   LV E/e' lateral: 9.7 LV SV:         74 LV SV Index:   33 LVOT Area:      3.14 cm   RIGHT VENTRICLE RV Basal diam:  2.70 cm RV Mid diam:    2.30 cm RV S prime:     11.30 cm/s  LEFT ATRIUM             Index        RIGHT ATRIUM           Index LA diam:        4.60 cm 2.05 cm/m   RA Area:     20.00 cm LA Vol (A2C):   66.1 ml 29.39 ml/m  RA Volume:   54.20 ml  24.10 ml/m LA Vol (A4C):   58.5 ml 26.01 ml/m LA Biplane Vol: 65.5 ml 29.13 ml/m AORTIC VALVE                    PULMONIC VALVE AV Area (Vmax):    2.89 cm     PV Vmax:       0.94 m/s AV Area (Vmean):   2.67 cm     PV Peak grad:  3.5 mmHg AV Area (VTI):     2.75 cm AV Vmax:           123.00 cm/s AV Vmean:          79.300 cm/s AV VTI:            0.270 m AV Peak Grad:      6.1 mmHg AV Mean Grad:      3.0 mmHg LVOT Vmax:         113.00 cm/s LVOT Vmean:        67.500 cm/s LVOT VTI:          0.236 m LVOT/AV VTI ratio: 0.87  AORTA Ao Root diam: 3.70 cm  MITRAL VALVE                TRICUSPID VALVE MV Area (PHT): 5.13 cm     TR Peak grad:   12.4 mmHg MV Area VTI:   2.61 cm  TR Vmax:        176.00 cm/s MV Peak grad:  6.9 mmHg MV Mean grad:  2.0 mmHg     SHUNTS MV Vmax:       1.31 m/s     Systemic VTI:  0.24 m MV Vmean:      65.4 cm/s    Systemic Diam: 2.00 cm MV Decel Time: 148 msec MV E velocity: 121.00 cm/s  Nona Dell MD Electronically signed by Nona Dell MD Signature Date/Time: 12/17/2021/4:29:15 PM    Final    MONITORS  CARDIAC EVENT MONITOR 03/27/2017  Narrative  No atrial fibrillation detected  Occasional sinus bradycardia noted, ranging from 45-59 bpm.  Symptoms of fatigue were associated with both sinus rhythm as well as sinus bradycardia.  There were no significant pauses.  Overall reassuring monitor.  Rhythm does not explain fatigue. Donato Schultz, MD            EKG:  EKG is  ordered today.  The ekg ordered today demonstrates atrial flutter relation with RVR, rate 129 bpm  Recent Labs: 05/05/2022: ALT 11; BUN 13; Creatinine, Ser 0.80;  Hemoglobin 15.2; Platelets 275; Potassium 4.4; Sodium 143; TSH 1.780  Recent Lipid Panel    Component Value Date/Time   CHOL 173 05/05/2022 1122   TRIG 164 (H) 05/05/2022 1122   HDL 38 (L) 05/05/2022 1122   CHOLHDL 4.6 (H) 05/05/2022 1122   LDLCALC 106 (H) 05/05/2022 1122    Risk Assessment/Calculations:   CHA2DS2-VASc Score = 4   This indicates a 4.8% annual risk of stroke. The patient's score is based upon: CHF History: 0 HTN History: 1 Diabetes History: 1 Stroke History: 0 Vascular Disease History: 0 Age Score: 1 Gender Score: 1     Home Medications   Current Meds  Medication Sig   acetaminophen (TYLENOL) 500 MG tablet Take 500 mg by mouth 3 times/day as needed-between meals & bedtime for moderate pain. Pt takes 1000 mg 2 tablet bid   albuterol (VENTOLIN HFA) 108 (90 Base) MCG/ACT inhaler Inhale 2 puffs into the lungs every 4 (four) hours as needed for wheezing.   budesonide-formoterol (SYMBICORT) 160-4.5 MCG/ACT inhaler Inhale 2 puffs into the lungs 2 (two) times daily.   Calcium Carbonate (CALCIUM 600 PO) Take by mouth. 2 chewables by mouth daily- Bariatric fusion   cephALEXin (KEFLEX) 500 MG capsule Take 1 capsule (500 mg total) by mouth 4 (four) times daily.   Cholecalciferol 1.25 MG (50000 UT) capsule Take by mouth.   COMIRNATY syringe    digoxin (LANOXIN) 0.125 MG tablet Take 1 tablet (0.125 mg total) by mouth every other day.   famotidine (PEPCID) 20 MG tablet TAKE 1 TABLET BY MOUTH TWICE A DAY   FLUAD QUADRIVALENT 0.5 ML injection    furosemide (LASIX) 40 MG tablet Take 40 mg by mouth as needed.   levocetirizine (XYZAL) 5 MG tablet Take 1 tablet (5 mg total) by mouth every evening.   losartan (COZAAR) 100 MG tablet Take 1 tablet (100 mg total) by mouth daily.   metFORMIN (GLUCOPHAGE-XR) 500 MG 24 hr tablet TAKE 1 TABLET BY MOUTH EVERY DAY WITH BREAKFAST   metoprolol succinate (TOPROL-XL) 25 MG 24 hr tablet Take 1 tablet (25 mg total) by mouth 2 (two) times  daily.   montelukast (SINGULAIR) 10 MG tablet Take 1 tablet (10 mg total) by mouth daily.   Multiple Vitamin (MULTIVITAMIN) tablet Take 1 tablet by mouth daily. Bariatric fusion   rosuvastatin (CRESTOR) 5 MG tablet Take 1  tablet (5 mg total) by mouth daily.   TIADYLT ER 240 MG 24 hr capsule Take by mouth daily.   [DISCONTINUED] ELIQUIS 5 MG TABS tablet Take 1 tablet (5 mg total) by mouth 2 (two) times daily.   [DISCONTINUED] warfarin (COUMADIN) 2.5 MG tablet Take 1 tablet (2.5 mg total) by mouth daily. Will take along with eliquis for 3 days and then discontinue the eliquis after the 3 days     Review of Systems      All other systems reviewed and are otherwise negative except as noted above.  Physical Exam    VS:  BP 108/64   Pulse (!) 129   Ht 5\' 4"  (1.626 m)   Wt 294 lb 12.8 oz (133.7 kg)   SpO2 97%   BMI 50.60 kg/m  , BMI Body mass index is 50.6 kg/m.  Wt Readings from Last 3 Encounters:  08/26/22 294 lb 12.8 oz (133.7 kg)  05/21/22 297 lb (134.7 kg)  04/23/22 292 lb (132.5 kg)     GEN: Well nourished, well developed, in no acute distress. HEENT: normal. Neck: Supple, no JVD, carotid bruits, or masses. Cardiac: Irregularly irregular, no murmurs, rubs, or gallops. No clubbing, cyanosis, 1+ non pitting edema.  Radials/PT 2+ and equal bilaterally.  Respiratory:  Respirations regular and unlabored, clear to auscultation bilaterally. GI: Soft, nontender, nondistended. MS: No deformity or atrophy. Skin: Warm and dry, no rash. Neuro:  Strength and sensation are intact. Psych: Normal affect.  Assessment & Plan    Paroxysmal atrial fibrillation -Atrial fibrillation with RVR today, rate 129 bpm -Obtain digoxin level and increase if indicated -Continue diltiazem and metoprolol as well as Eliquis -Once she runs out of her Eliquis she is planning to switch to Coumadin, discussed continuing Eliquis for 3 days while starting Coumadin and then discontinuing after 3 days. -She was  started on Coumadin 5 mg daily -She will need close follow-up, not interested in another DCCV or invasive procedures -goal is rate control -needs coumadin clinic appointment but lives in [place], discussed needs an INR in 5-6 days after starting coumadin -will order split night sleep study for excessive daytime sleepiness  Mitral valve insufficiency, unspecified etiology -Recent echocardiogram September 2023 showed only a mild leak of her mitral valve  HTN -Blood pressure well-controlled today 108/64 -continue current medications       Disposition: Follow up 4 months  with Donato Schultz, MD or APP.  Signed, Sharlene Dory, PA-C 08/26/2022, 4:54 PM [facility]

## 2022-08-26 NOTE — Telephone Encounter (Signed)
Patient is returning calling 

## 2022-08-26 NOTE — Telephone Encounter (Signed)
-----   Message from Olene Floss, RPH-CPP sent at 08/26/2022  4:46 PM EDT ----- Needs new pt apt

## 2022-08-26 NOTE — Patient Instructions (Addendum)
Medication Instructions:  1.Start warfarin (Coumadin) 5 mg daily for three days while still taking eliquis, then continue coumadin 5 mg daily thereafter and discontinue the eliquis. *If you need a refill on your cardiac medications before your next appointment, please call your pharmacy*   Lab Work: Digoxin level today If you have labs (blood work) drawn today and your tests are completely normal, you will receive your results only by: MyChart Message (if you have MyChart) OR A paper copy in the mail If you have any lab test that is abnormal or we need to change your treatment, we will call you to review the results.   Follow-Up: At [facility], you and your health needs are our priority.  As part of our continuing mission to provide you with exceptional heart care, we have created designated Provider Care Teams.  These Care Teams include your primary Cardiologist (physician) and Advanced Practice Providers (APPs -  Physician Assistants and Nurse Practitioners) who all work together to provide you with the care you need, when you need it.  Your next appointments:  5-6 days in [place] for an INR 2 weeks with Dr Mayford Knife to discuss Inspire 2 weeks with Dr Lalla Brothers to discuss Watchman 4 months with Dr Anne Fu  Low-Sodium Eating Plan Sodium, which is an element that makes up salt, helps you maintain a healthy balance of fluids in your body. Too much sodium can increase your blood pressure and cause fluid and waste to be held in your body. Your health care provider or dietitian may recommend following this plan if you have high blood pressure (hypertension), kidney disease, liver disease, or heart failure. Eating less sodium can help lower your blood pressure, reduce swelling, and protect your heart, liver, and kidneys. What are tips for following this plan? Reading food labels The Nutrition Facts label lists the amount of sodium in one serving of the food. If you eat more than one serving,  you must multiply the listed amount of sodium by the number of servings. Choose foods with less than 140 mg of sodium per serving. Avoid foods with 300 mg of sodium or more per serving. Shopping  Look for lower-sodium products, often labeled as "low-sodium" or "no salt added." Always check the sodium content, even if foods are labeled as "unsalted" or "no salt added." Buy fresh foods. Avoid canned foods and pre-made or frozen meals. Avoid canned, cured, or processed meats. Buy breads that have less than 80 mg of sodium per slice. Cooking  Eat more home-cooked food and less restaurant, buffet, and fast food. Avoid adding salt when cooking. Use salt-free seasonings or herbs instead of table salt or sea salt. Check with your health care provider or pharmacist before using salt substitutes. Cook with plant-based oils, such as canola, sunflower, or olive oil. Meal planning When eating at a restaurant, ask that your food be prepared with less salt or no salt, if possible. Avoid dishes labeled as brined, pickled, cured, smoked, or made with soy sauce, miso, or teriyaki sauce. Avoid foods that contain MSG (monosodium glutamate). MSG is sometimes added to [place] food, bouillon, and some canned foods. Make meals that can be grilled, baked, poached, roasted, or steamed. These are generally made with less sodium. General information Most people on this plan should limit their sodium intake to 1,500-2,000 mg (milligrams) of sodium each day. What foods should I eat? Fruits Fresh, frozen, or canned fruit. Fruit juice. Vegetables Fresh or frozen vegetables. "No salt added" canned vegetables. "No  salt added" tomato sauce and paste. Low-sodium or reduced-sodium tomato and vegetable juice. Grains Low-sodium cereals, including oats, puffed wheat and rice, and shredded wheat. Low-sodium crackers. Unsalted rice. Unsalted pasta. Low-sodium bread. Whole-grain breads and whole-grain pasta. Meats and other  proteins Fresh or frozen (no salt added) meat, poultry, seafood, and fish. Low-sodium canned tuna and salmon. Unsalted nuts. Dried peas, beans, and lentils without added salt. Unsalted canned beans. Eggs. Unsalted nut butters. Dairy Milk. Soy milk. Cheese that is naturally low in sodium, such as ricotta cheese, fresh mozzarella, or Swiss cheese. Low-sodium or reduced-sodium cheese. Cream cheese. Yogurt. Seasonings and condiments Fresh and dried herbs and spices. Salt-free seasonings. Low-sodium mustard and ketchup. Sodium-free salad dressing. Sodium-free light mayonnaise. Fresh or refrigerated horseradish. Lemon juice. Vinegar. Other foods Homemade, reduced-sodium, or low-sodium soups. Unsalted popcorn and pretzels. Low-salt or salt-free chips. The items listed above may not be a complete list of foods and beverages you can eat. Contact a dietitian for more information. What foods should I avoid? Vegetables Sauerkraut, pickled vegetables, and relishes. Olives. [place] fries. Onion rings. Regular canned vegetables (not low-sodium or reduced-sodium). Regular canned tomato sauce and paste (not low-sodium or reduced-sodium). Regular tomato and vegetable juice (not low-sodium or reduced-sodium). Frozen vegetables in sauces. Grains Instant hot cereals. Bread stuffing, pancake, and biscuit mixes. Croutons. Seasoned rice or pasta mixes. Noodle soup cups. Boxed or frozen macaroni and cheese. Regular salted crackers. Self-rising flour. Meats and other proteins Meat or fish that is salted, canned, smoked, spiced, or pickled. Precooked or cured meat, such as sausages or meat loaves. Tomasa Blase. Ham. Pepperoni. Hot dogs. Corned beef. Chipped beef. Salt pork. Jerky. Pickled herring. Anchovies and sardines. Regular canned tuna. Salted nuts. Dairy Processed cheese and cheese spreads. Hard cheeses. Cheese curds. Blue cheese. Feta cheese. String cheese. Regular cottage cheese. Buttermilk. Canned milk. Fats and  oils Salted butter. Regular margarine. Ghee. Bacon fat. Seasonings and condiments Onion salt, garlic salt, seasoned salt, table salt, and sea salt. Canned and packaged gravies. Worcestershire sauce. Tartar sauce. Barbecue sauce. Teriyaki sauce. Soy sauce, including reduced-sodium. Steak sauce. Fish sauce. Oyster sauce. Cocktail sauce. Horseradish that you find on the shelf. Regular ketchup and mustard. Meat flavorings and tenderizers. Bouillon cubes. Hot sauce. Pre-made or packaged marinades. Pre-made or packaged taco seasonings. Relishes. Regular salad dressings. Salsa. Other foods Salted popcorn and pretzels. Corn chips and puffs. Potato and tortilla chips. Canned or dried soups. Pizza. Frozen entrees and pot pies. The items listed above may not be a complete list of foods and beverages you should avoid. Contact a dietitian for more information. Summary Eating less sodium can help lower your blood pressure, reduce swelling, and protect your heart, liver, and kidneys. Most people on this plan should limit their sodium intake to 1,500-2,000 mg (milligrams) of sodium each day. Canned, boxed, and frozen foods are high in sodium. Restaurant foods, fast foods, and pizza are also very high in sodium. You also get sodium by adding salt to food. Try to cook at home, eat more fresh fruits and vegetables, and eat less fast food and canned, processed, or prepared foods. This information is not intended to replace advice given to you by your health care provider. Make sure you discuss any questions you have with your health care provider. Document Revised: 02/28/2019 Document Reviewed: 02/23/2019 Elsevier Patient Education  2023 Elsevier Inc.  Heart-Healthy Eating Plan Many factors influence your heart health, including eating and exercise habits. Heart health is also called coronary health. Coronary risk increases with  abnormal blood fat (lipid) levels. A heart-healthy eating plan includes limiting unhealthy  fats, increasing healthy fats, limiting salt (sodium) intake, and making other diet and lifestyle changes. What is my plan? Your health care provider may recommend that: You limit your fat intake to _________% or less of your total calories each day. You limit your saturated fat intake to _________% or less of your total calories each day. You limit the amount of cholesterol in your diet to less than _________ mg per day. You limit the amount of sodium in your diet to less than _________ mg per day. What are tips for following this plan? Cooking Cook foods using methods other than frying. Baking, boiling, grilling, and broiling are all good options. Other ways to reduce fat include: Removing the skin from poultry. Removing all visible fats from meats. Steaming vegetables in water or broth. Meal planning  At meals, imagine dividing your plate into fourths: Fill one-half of your plate with vegetables and green salads. Fill one-fourth of your plate with whole grains. Fill one-fourth of your plate with lean protein foods. Eat 2-4 cups of vegetables per day. One cup of vegetables equals 1 cup (91 g) broccoli or cauliflower florets, 2 medium carrots, 1 large bell pepper, 1 large sweet potato, 1 large tomato, 1 medium white potato, 2 cups (150 g) raw leafy greens. Eat 1-2 cups of fruit per day. One cup of fruit equals 1 small apple, 1 large banana, 1 cup (237 g) mixed fruit, 1 large orange,  cup (82 g) dried fruit, 1 cup (240 mL) 100% fruit juice. Eat more foods that contain soluble fiber. Examples include apples, broccoli, carrots, beans, peas, and barley. Aim to get 25-30 g of fiber per day. Increase your consumption of legumes, nuts, and seeds to 4-5 servings per week. One serving of dried beans or legumes equals  cup (90 g) cooked, 1 serving of nuts is  oz (12 almonds, 24 pistachios, or 7 walnut halves), and 1 serving of seeds equals  oz (8 g). Fats Choose healthy fats more often. Choose  monounsaturated and polyunsaturated fats, such as olive and canola oils, avocado oil, flaxseeds, walnuts, almonds, and seeds. Eat more omega-3 fats. Choose salmon, mackerel, sardines, tuna, flaxseed oil, and ground flaxseeds. Aim to eat fish at least 2 times each week. Check food labels carefully to identify foods with trans fats or high amounts of saturated fat. Limit saturated fats. These are found in animal products, such as meats, butter, and cream. Plant sources of saturated fats include palm oil, palm kernel oil, and coconut oil. Avoid foods with partially hydrogenated oils in them. These contain trans fats. Examples are stick margarine, some tub margarines, cookies, crackers, and other baked goods. Avoid fried foods. General information Eat more home-cooked food and less restaurant, buffet, and fast food. Limit or avoid alcohol. Limit foods that are high in added sugar and simple starches such as foods made using white refined flour (white breads, pastries, sweets). Lose weight if you are overweight. Losing just 5-10% of your body weight can help your overall health and prevent diseases such as diabetes and heart disease. Monitor your sodium intake, especially if you have high blood pressure. Talk with your health care provider about your sodium intake. Try to incorporate more vegetarian meals weekly. What foods should I eat? Fruits All fresh, canned (in natural juice), or frozen fruits. Vegetables Fresh or frozen vegetables (raw, steamed, roasted, or grilled). Green salads. Grains Most grains. Choose whole wheat and whole  grains most of the time. Rice and pasta, including brown rice and pastas made with whole wheat. Meats and other proteins Lean, well-trimmed beef, veal, pork, and lamb. Chicken and [place] without skin. All fish and shellfish. Wild duck, rabbit, pheasant, and venison. Egg whites or low-cholesterol egg substitutes. Dried beans, peas, lentils, and tofu. Seeds and most  nuts. Dairy Low-fat or nonfat cheeses, including ricotta and mozzarella. Skim or 1% milk (liquid, powdered, or evaporated). Buttermilk made with low-fat milk. Nonfat or low-fat yogurt. Fats and oils Non-hydrogenated (trans-free) margarines. Vegetable oils, including soybean, sesame, sunflower, olive, avocado, peanut, safflower, corn, canola, and cottonseed. Salad dressings or mayonnaise made with a vegetable oil. Beverages Water (mineral or sparkling). Coffee and tea. Unsweetened ice tea. Diet beverages. Sweets and desserts Sherbet, gelatin, and fruit ice. Small amounts of dark chocolate. Limit all sweets and desserts. Seasonings and condiments All seasonings and condiments. The items listed above may not be a complete list of foods and beverages you can eat. Contact a dietitian for more options. What foods should I avoid? Fruits Canned fruit in heavy syrup. Fruit in cream or butter sauce. Fried fruit. Limit coconut. Vegetables Vegetables cooked in cheese, cream, or butter sauce. Fried vegetables. Grains Breads made with saturated or trans fats, oils, or whole milk. Croissants. Sweet rolls. Donuts. High-fat crackers, such as cheese crackers and chips. Meats and other proteins Fatty meats, such as hot dogs, ribs, sausage, bacon, rib-eye roast or steak. High-fat deli meats, such as salami and bologna. Caviar. Domestic duck and goose. Organ meats, such as liver. Dairy Cream, sour cream, cream cheese, and creamed cottage cheese. Whole-milk cheeses. Whole or 2% milk (liquid, evaporated, or condensed). Whole buttermilk. Cream sauce or high-fat cheese sauce. Whole-milk yogurt. Fats and oils Meat fat, or shortening. Cocoa butter, hydrogenated oils, palm oil, coconut oil, palm kernel oil. Solid fats and shortenings, including bacon fat, salt pork, lard, and butter. Nondairy cream substitutes. Salad dressings with cheese or sour cream. Beverages Regular sodas and any drinks with added sugar. Sweets  and desserts Frosting. Pudding. Cookies. Cakes. Pies. Milk chocolate or white chocolate. Buttered syrups. Full-fat ice cream or ice cream drinks. The items listed above may not be a complete list of foods and beverages to avoid. Contact a dietitian for more information. Summary Heart-healthy meal planning includes limiting unhealthy fats, increasing healthy fats, limiting salt (sodium) intake and making other diet and lifestyle changes. Lose weight if you are overweight. Losing just 5-10% of your body weight can help your overall health and prevent diseases such as diabetes and heart disease. Focus on eating a balance of foods, including fruits and vegetables, low-fat or nonfat dairy, lean protein, nuts and legumes, whole grains, and heart-healthy oils and fats. This information is not intended to replace advice given to you by your health care provider. Make sure you discuss any questions you have with your health care provider. Document Revised: 04/29/2021 Document Reviewed: 04/29/2021 Elsevier Patient Education  2023 [employer].

## 2022-08-27 LAB — DIGOXIN LEVEL: Digoxin, Serum: <0.4 ng/mL — ABNORMAL LOW (ref 0.5–0.9)

## 2022-08-27 NOTE — Addendum Note (Signed)
Addended byDurenda Hurt on: 08/27/2022 01:34 PM   Modules accepted: Orders

## 2022-08-27 NOTE — Telephone Encounter (Signed)
Spoke with pt regarding a new Anticoagulation Appt. She has 23 pills of eliquis left that she would like to use up before starting warfarin. She was advised to continue taking eliquis twice a day until June 14th since she will run out that day if count is correct and on June 12th she will start taking warfarin once a day in the evening or night along with the eliquis. She is aware she will take both eliquis 5mg  (twice a day) and warfarin 5mg  (once day) June 12th, 13th, 14th then she will continue warfarin daily. She read back the instructions and confirmed correct. Also, gave her the [place] number to call with any questions.  She confirmed appt 09/23/22 in [place] office at 3pm & aware it will be a 30 minute appt.

## 2022-08-29 ENCOUNTER — Telehealth: Payer: Self-pay

## 2022-08-29 DIAGNOSIS — Z79899 Other long term (current) drug therapy: Secondary | ICD-10-CM

## 2022-08-29 MED ORDER — DIGOXIN 125 MCG PO TABS: 0.1250 mg | ORAL_TABLET | Freq: Every day | ORAL | 3 refills | Status: DC

## 2022-08-29 NOTE — Telephone Encounter (Signed)
Per Jari Favre, explained to patient that she needs to take 0.125 mg digoxin daily instead of every other day and retest in one month. Patient verbalizes understanding and agrees to plan. Labs ordered and scheduled, med list updated.

## 2022-08-29 NOTE — Telephone Encounter (Signed)
Per Helayne Seminole, called to arrange [facility] consult.  Left message to call back.

## 2022-09-02 NOTE — Telephone Encounter (Signed)
Scheduled Alexis [name] for [facility] consult with Dr. Lalla Brothers 09/04/2022. She was grateful for call and agreed with plan.

## 2022-09-03 ENCOUNTER — Other Ambulatory Visit: Payer: Self-pay

## 2022-09-03 MED ORDER — DIGOXIN 125 MCG PO TABS: 0.1250 mg | ORAL_TABLET | Freq: Every day | ORAL | 3 refills | Status: DC

## 2022-09-03 NOTE — Telephone Encounter (Signed)
Pt's medication was sent to pt's pharmacy as requested. Confirmation received.  °

## 2022-09-03 NOTE — Progress Notes (Deleted)
  Electrophysiology Office Note:    Date:  09/03/2022   ID:  Alexis [name], DOB 06-13-1955, MRN [id number]  [facility] Cardiologist:  Donato Schultz, MD  [facility] Electrophysiologist:  None   Referring MD: Wylene Men*   Chief Complaint: Atrial fibrillation  History of Present Illness:    Alexis [name] is a 67 y.o. female who I am seeing today for an evaluation of atrial fibrillation at the request of Jari Favre.  The patient was seen in clinic Aug 26, 2022.  She has a medical history that includes paroxysmal atrial fibrillation, endometrial cancer, uterine cancer, hypertension.  The patient reports at least 15 prior cardioversions.  At the prior appointment in May she was started on Coumadin given difficulty with NOACs.  In the past she has been very hesitant to consider invasive procedures.        Their past medical, social and family history was reveiwed.   ROS:   Please see the history of present illness.    All other systems reviewed and are negative.  EKGs/Labs/Other Studies Reviewed:    The following studies were reviewed today:  December 17, 2021 echo EF 55 to 60% RV in the low normal Mild MR   Aug 26, 2022 EKG shows atrial fibrillation with rapid ventricular rates in the 120s     Physical Exam:    VS:  There were no vitals taken for this visit.    Wt Readings from Last 3 Encounters:  08/26/22 294 lb 12.8 oz (133.7 kg)  05/21/22 297 lb (134.7 kg)  04/23/22 292 lb (132.5 kg)     GEN: *** Well nourished, well developed in no acute distress CARDIAC: ***RRR, no murmurs, rubs, gallops RESPIRATORY:  Clear to auscultation without rales, wheezing or rhonchi       ASSESSMENT AND PLAN:    No diagnosis found.  #Persistent atrial fibrillation Potentially permanent given her history and multiple prior unsuccessful cardioversions. Has experienced difficulty with NOACs.  Left atrial appendage occlusion has been discussed in the past with  the patient.  Given her BMI greater than 50 she is not a candidate for invasive EP procedures at this time.  Her rates are poorly controlled. ***Increase metoprolol succinate to 50 mg by mouth twice daily Continue digoxin Continue warfarin  #Morbid obesity Prevents safe invasive EP procedures.  Weight loss encouraged.   Follow-up with EP on an as-needed basis       Total time spent with patient today *** minutes. This includes reviewing records, evaluating the patient and coordinating care.     Signed, Rossie Muskrat. Lalla Brothers, MD, [facility], [facility] 09/03/2022 10:09 PM    Electrophysiology [facility]

## 2022-09-04 ENCOUNTER — Ambulatory Visit: Payer: Medicare HMO | Admitting: Cardiology

## 2022-09-04 DIAGNOSIS — I480 Paroxysmal atrial fibrillation: Secondary | ICD-10-CM

## 2022-09-04 NOTE — Telephone Encounter (Signed)
The patient called to reschedule her Watchman consult. Scheduled her for consult with Dr. Lalla Brothers 01/09/2023. She was grateful for assistance.

## 2022-09-10 ENCOUNTER — Ambulatory Visit: Payer: Medicare HMO | Admitting: Family Medicine

## 2022-09-11 ENCOUNTER — Encounter: Payer: Self-pay | Admitting: Family Medicine

## 2022-09-11 ENCOUNTER — Ambulatory Visit (INDEPENDENT_AMBULATORY_CARE_PROVIDER_SITE_OTHER): Payer: Medicare HMO | Admitting: Family Medicine

## 2022-09-11 VITALS — BP 124/80 | HR 68 | Ht 64.0 in | Wt 294.0 lb

## 2022-09-11 DIAGNOSIS — E119 Type 2 diabetes mellitus without complications: Secondary | ICD-10-CM

## 2022-09-11 DIAGNOSIS — Z13220 Encounter for screening for lipoid disorders: Secondary | ICD-10-CM

## 2022-09-11 DIAGNOSIS — E538 Deficiency of other specified B group vitamins: Secondary | ICD-10-CM | POA: Diagnosis not present

## 2022-09-11 DIAGNOSIS — I10 Essential (primary) hypertension: Secondary | ICD-10-CM

## 2022-09-11 DIAGNOSIS — E559 Vitamin D deficiency, unspecified: Secondary | ICD-10-CM

## 2022-09-11 DIAGNOSIS — E1169 Type 2 diabetes mellitus with other specified complication: Secondary | ICD-10-CM | POA: Insufficient documentation

## 2022-09-11 MED ORDER — BLOOD GLUCOSE TEST VI STRP
1.0000 | ORAL_STRIP | Freq: Three times a day (TID) | 3 refills | Status: AC
Start: 2022-09-11 — End: 2023-01-09

## 2022-09-11 MED ORDER — LANCETS MISC. MISC: 1.0000 | Freq: Three times a day (TID) | 0 refills | Status: AC

## 2022-09-11 MED ORDER — BLOOD GLUCOSE MONITORING SUPPL DEVI
1.0000 | Freq: Three times a day (TID) | 0 refills | Status: AC
Start: 2022-09-11 — End: ?

## 2022-09-11 MED ORDER — LANCET DEVICE MISC
1.0000 | Freq: Three times a day (TID) | 0 refills | Status: AC
Start: 2022-09-11 — End: 2022-10-11

## 2022-09-11 NOTE — Progress Notes (Signed)
Patient Office Visit   Subjective   Patient ID: Alexis [name], female    DOB: 10/11/1955  Age: 67 y.o. MRN: [id number]  CC:  Chief Complaint  Patient presents with   Diabetes    Patient wants to discuss DM meds. States she stopped taking metformin and never started rybelsis. States she read that both meds can cause cancer, and she has a floater in her L eye since starting metformin.     HPI Alexis [name] 67 year old female, presents to the clinic for type 2 diabetes management.She  has a past medical history of Asthma (01/21/2013), Endometrial cancer (HCC) (01/21/2013), HTN (hypertension), Morbid obesity (HCC), Paroxysmal A-fib (HCC), and Uterine cancer (HCC).For the details of today's visit, please refer to assessment and plan.   HPI    Outpatient Encounter Medications as of 09/11/2022  Medication Sig   acetaminophen (TYLENOL) 500 MG tablet Take 500 mg by mouth 3 times/day as needed-between meals & bedtime for moderate pain. Pt takes 1000 mg 2 tablet bid   albuterol (VENTOLIN HFA) 108 (90 Base) MCG/ACT inhaler Inhale 2 puffs into the lungs every 4 (four) hours as needed for wheezing.   Blood Glucose Monitoring Suppl DEVI 1 each by Does not apply route in the morning, at noon, and at bedtime. May substitute to any manufacturer covered by patient's insurance.   budesonide-formoterol (SYMBICORT) 160-4.5 MCG/ACT inhaler Inhale 2 puffs into the lungs 2 (two) times daily.   Calcium Carbonate (CALCIUM 600 PO) Take by mouth. 2 chewables by mouth daily- Bariatric fusion   cephALEXin (KEFLEX) 500 MG capsule Take 1 capsule (500 mg total) by mouth 4 (four) times daily.   Cholecalciferol 1.25 MG (50000 UT) capsule Take by mouth.   COMIRNATY syringe    digoxin (LANOXIN) 0.125 MG tablet Take 1 tablet (0.125 mg total) by mouth daily.   famotidine (PEPCID) 20 MG tablet TAKE 1 TABLET BY MOUTH TWICE A DAY   FLUAD QUADRIVALENT 0.5 ML injection    furosemide (LASIX) 40 MG tablet Take 40 mg by mouth as  needed.   Glucose Blood (BLOOD GLUCOSE TEST STRIPS) STRP 1 each by In Vitro route in the morning, at noon, and at bedtime. May substitute to any manufacturer covered by patient's insurance.   Lancet Device MISC 1 each by Does not apply route in the morning, at noon, and at bedtime. May substitute to any manufacturer covered by patient's insurance.   Lancets Misc. MISC 1 each by Does not apply route in the morning, at noon, and at bedtime. May substitute to any manufacturer covered by patient's insurance.   levocetirizine (XYZAL) 5 MG tablet Take 1 tablet (5 mg total) by mouth every evening.   losartan (COZAAR) 100 MG tablet Take 1 tablet (100 mg total) by mouth daily.   metoprolol succinate (TOPROL-XL) 25 MG 24 hr tablet Take 1 tablet (25 mg total) by mouth 2 (two) times daily.   montelukast (SINGULAIR) 10 MG tablet Take 1 tablet (10 mg total) by mouth daily.   Multiple Vitamin (MULTIVITAMIN) tablet Take 1 tablet by mouth daily. Bariatric fusion   rosuvastatin (CRESTOR) 5 MG tablet Take 1 tablet (5 mg total) by mouth daily.   TIADYLT ER 240 MG 24 hr capsule Take by mouth daily.   warfarin (COUMADIN) 5 MG tablet Take 1 tablet (5 mg total) by mouth daily. Will take along with eliquis for 3 days and then discontinue the eliquis after the 3 days   metFORMIN (GLUCOPHAGE-XR) 500 MG 24 hr tablet  TAKE 1 TABLET BY MOUTH EVERY DAY WITH BREAKFAST (Patient not taking: Reported on 09/11/2022)   RYBELSUS 7 MG TABS Take 1 tablet by mouth daily. (Patient not taking: Reported on 09/11/2022)   No facility-administered encounter medications on file as of 09/11/2022.    Past Surgical History:  Procedure Laterality Date   ABLATION     APPENDECTOMY     DILATION AND CURETTAGE OF UTERUS     NASAL SINUS SURGERY      Review of Systems  Constitutional:  Negative for chills and fever.  Respiratory:  Negative for shortness of breath.   Gastrointestinal:  Negative for abdominal pain.  Genitourinary:  Negative for dysuria.       Objective    BP 124/80   Pulse 68   Ht 5\' 4"  (1.626 m)   Wt 294 lb (133.4 kg)   SpO2 94%   BMI 50.46 kg/m   Physical Exam Vitals reviewed.  Constitutional:      General: She is not in acute distress.    Appearance: Normal appearance. She is not ill-appearing, toxic-appearing or diaphoretic.  HENT:     Head: Normocephalic.  Eyes:     General:        Right eye: No discharge.        Left eye: No discharge.     Extraocular Movements: Extraocular movements intact.     Conjunctiva/sclera: Conjunctivae normal.     Pupils: Pupils are equal, round, and reactive to light.  Cardiovascular:     Rate and Rhythm: Normal rate.     Pulses: Normal pulses.     Heart sounds: Normal heart sounds.  Pulmonary:     Effort: Pulmonary effort is normal. No respiratory distress.     Breath sounds: Normal breath sounds.  Musculoskeletal:        General: Normal range of motion.     Cervical back: Normal range of motion.  Skin:    General: Skin is warm and dry.     Capillary Refill: Capillary refill takes less than 2 seconds.  Neurological:     General: No focal deficit present.     Mental Status: She is alert and oriented to person, place, and time.     Motor: Weakness present.     Coordination: Coordination abnormal.     Gait: Gait abnormal.  Psychiatric:        Mood and Affect: Mood normal.       Assessment & Plan:  Type 2 diabetes mellitus without complication, without long-term current use of insulin (HCC) Assessment & Plan: Hemoglobin A1C 6.8  not at a goal, Labs ordered today awaiting results will follow up. Patient reporting not taking  metformin or rybelsus 7 mg due it being link to cancer. Discussed medication desired effects, potential side effects, Nonpharmacological interventions such as low carb diet,high in protein, vegetables and fruit discussed. Educated on importance of physical activity 150 minutes per week. Discussed signs and symptoms of hypoglycemia, & hyperglycemia  and need to present to the ED if symptoms occurs.Follow up in 3 months or sooner if needed. Patient verbalizes understanding regarding plan of care and all questions answered. Ophthalmology not current , Foot exam within desired limits   Orders: -     Hemoglobin A1c -     Microalbumin / creatinine urine ratio -     Blood Glucose Monitoring Suppl; 1 each by Does not apply route in the morning, at noon, and at bedtime. May substitute to any manufacturer covered by  patient's insurance.  Dispense: 1 each; Refill: 0 -     Blood Glucose Test; 1 each by In Vitro route in the morning, at noon, and at bedtime. May substitute to any manufacturer covered by patient's insurance.  Dispense: 90 each; Refill: 3 -     Lancet Device; 1 each by Does not apply route in the morning, at noon, and at bedtime. May substitute to any manufacturer covered by patient's insurance.  Dispense: 1 each; Refill: 0  Screening for lipid disorders -     Lipid panel  Vitamin D deficiency -     VITAMIN D 25 Hydroxy (Vit-D Deficiency, Fractures)  Primary hypertension -     CMP14+EGFR -     CBC with Differential/Platelet  Vitamin B12 deficiency -     Vitamin B12  Other orders -     Lancets Misc.; 1 each by Does not apply route in the morning, at noon, and at bedtime. May substitute to any manufacturer covered by patient's insurance.  Dispense: 100 each; Refill: 0    Return in about 4 months (around 01/11/2023) for diabetes, routine labs, chronic follow-up.   [name], FNP

## 2022-09-11 NOTE — Patient Instructions (Signed)

## 2022-09-11 NOTE — Assessment & Plan Note (Addendum)
Hemoglobin A1C 6.8  not at a goal, Labs ordered today awaiting results will follow up. Patient reporting not taking  metformin or rybelsus 7 mg due it being link to cancer. Discussed medication desired effects, potential side effects, Nonpharmacological interventions such as low carb diet,high in protein, vegetables and fruit discussed. Educated on importance of physical activity 150 minutes per week. Discussed signs and symptoms of hypoglycemia, & hyperglycemia and need to present to the ED if symptoms occurs.Follow up in 3 months or sooner if needed. Patient verbalizes understanding regarding plan of care and all questions answered. Ophthalmology not current , Foot exam within desired limits

## 2022-09-12 ENCOUNTER — Other Ambulatory Visit: Payer: Self-pay | Admitting: Family Medicine

## 2022-09-12 ENCOUNTER — Encounter: Payer: Self-pay | Admitting: Family Medicine

## 2022-09-23 ENCOUNTER — Ambulatory Visit: Payer: Medicare HMO | Attending: Internal Medicine | Admitting: *Deleted

## 2022-09-23 DIAGNOSIS — Z5181 Encounter for therapeutic drug level monitoring: Secondary | ICD-10-CM | POA: Diagnosis not present

## 2022-09-23 DIAGNOSIS — I480 Paroxysmal atrial fibrillation: Secondary | ICD-10-CM | POA: Diagnosis not present

## 2022-09-23 LAB — POCT INR: INR: 1.6 — AB (ref 2.0–3.0)

## 2022-09-23 NOTE — Patient Instructions (Addendum)
Has been on warfarin 5mg  daily since 09/17/22 Increase warfarin to 1 tablet daily except 1 1/2 tablets on Tuesdays, Thursdays and Saturdays Recheck INR in 1 week

## 2022-10-06 DIAGNOSIS — E119 Type 2 diabetes mellitus without complications: Secondary | ICD-10-CM | POA: Diagnosis not present

## 2022-10-06 DIAGNOSIS — Z13220 Encounter for screening for lipoid disorders: Secondary | ICD-10-CM | POA: Diagnosis not present

## 2022-10-06 DIAGNOSIS — I10 Essential (primary) hypertension: Secondary | ICD-10-CM | POA: Diagnosis not present

## 2022-10-06 DIAGNOSIS — E559 Vitamin D deficiency, unspecified: Secondary | ICD-10-CM | POA: Diagnosis not present

## 2022-10-06 DIAGNOSIS — E538 Deficiency of other specified B group vitamins: Secondary | ICD-10-CM | POA: Diagnosis not present

## 2022-10-07 ENCOUNTER — Other Ambulatory Visit: Payer: Self-pay | Admitting: Family Medicine

## 2022-10-07 LAB — CMP14+EGFR
ALT: 13 IU/L (ref 0–32)
AST: 15 IU/L (ref 0–40)
Albumin: 4.1 g/dL (ref 3.9–4.9)
Alkaline Phosphatase: 114 IU/L (ref 44–121)
BUN/Creatinine Ratio: 17 (ref 12–28)
BUN: 13 mg/dL (ref 8–27)
Bilirubin Total: 0.4 mg/dL (ref 0.0–1.2)
CO2: 24 mmol/L (ref 20–29)
Calcium: 8.8 mg/dL (ref 8.7–10.3)
Chloride: 105 mmol/L (ref 96–106)
Creatinine, Ser: 0.75 mg/dL (ref 0.57–1.00)
Globulin, Total: 2 g/dL (ref 1.5–4.5)
Glucose: 124 mg/dL — ABNORMAL HIGH (ref 70–99)
Potassium: 4.6 mmol/L (ref 3.5–5.2)
Sodium: 141 mmol/L (ref 134–144)
Total Protein: 6.1 g/dL (ref 6.0–8.5)
eGFR: 88 mL/min/{1.73_m2} (ref >59)

## 2022-10-07 LAB — CBC WITH DIFFERENTIAL/PLATELET
Basophils Absolute: 0.1 10*3/uL (ref 0.0–0.2)
Basos: 1 %
EOS (ABSOLUTE): 0.4 10*3/uL (ref 0.0–0.4)
Eos: 6 %
Hematocrit: 44.8 % (ref 34.0–46.6)
Hemoglobin: 15 g/dL (ref 11.1–15.9)
Immature Grans (Abs): 0 10*3/uL (ref 0.0–0.1)
Immature Granulocytes: 0 %
Lymphocytes Absolute: 1.6 10*3/uL (ref 0.7–3.1)
Lymphs: 22 %
MCH: 30.1 pg (ref 26.6–33.0)
MCHC: 33.5 g/dL (ref 31.5–35.7)
MCV: 90 fL (ref 79–97)
Monocytes Absolute: 0.3 10*3/uL (ref 0.1–0.9)
Monocytes: 5 %
Neutrophils Absolute: 4.8 10*3/uL (ref 1.4–7.0)
Neutrophils: 66 %
Platelets: 262 10*3/uL (ref 150–450)
RBC: 4.99 x10E6/uL (ref 3.77–5.28)
RDW: 12.8 % (ref 11.7–15.4)
WBC: 7.2 10*3/uL (ref 3.4–10.8)

## 2022-10-07 LAB — LIPID PANEL
Chol/HDL Ratio: 2.7 ratio (ref 0.0–4.4)
Cholesterol, Total: 120 mg/dL (ref 100–199)
HDL: 44 mg/dL (ref >39)
LDL Chol Calc (NIH): 54 mg/dL (ref 0–99)
Triglycerides: 121 mg/dL (ref 0–149)
VLDL Cholesterol Cal: 22 mg/dL (ref 5–40)

## 2022-10-07 LAB — VITAMIN D 25 HYDROXY (VIT D DEFICIENCY, FRACTURES): Vit D, 25-Hydroxy: 19.9 ng/mL — ABNORMAL LOW (ref 30.0–100.0)

## 2022-10-07 LAB — HEMOGLOBIN A1C
Est. average glucose Bld gHb Est-mCnc: 140 mg/dL
Hgb A1c MFr Bld: 6.5 % — ABNORMAL HIGH (ref 4.8–5.6)

## 2022-10-07 LAB — VITAMIN B12: Vitamin B-12: 243 pg/mL (ref 232–1245)

## 2022-10-07 MED ORDER — EMPAGLIFLOZIN 10 MG PO TABS: 10.0000 mg | ORAL_TABLET | Freq: Every day | ORAL | 3 refills | Status: DC

## 2022-10-14 ENCOUNTER — Ambulatory Visit: Payer: Medicare HMO | Attending: Cardiology | Admitting: *Deleted

## 2022-10-14 DIAGNOSIS — I480 Paroxysmal atrial fibrillation: Secondary | ICD-10-CM

## 2022-10-14 DIAGNOSIS — Z5181 Encounter for therapeutic drug level monitoring: Secondary | ICD-10-CM | POA: Diagnosis not present

## 2022-10-14 LAB — POCT INR: INR: 6.6 — AB (ref 2.0–3.0)

## 2022-10-14 NOTE — Patient Instructions (Addendum)
Pt has been taking 7.5mg  daily by mistake Hold warfarin x 3 days (Tues,Wed,Thurs) then decrease dose to 1 tablet daily except 1 1/2 tablets on Tuesdays and Saturdays Recheck INR in 1 week Pt denies S/S of bleeding or excessive bruising. Bleeding and fall precautions discussed with pt and she verbalized understanding.

## 2022-10-15 ENCOUNTER — Other Ambulatory Visit: Payer: Self-pay | Admitting: Family Medicine

## 2022-10-21 ENCOUNTER — Ambulatory Visit: Payer: Medicare HMO | Attending: Cardiology | Admitting: *Deleted

## 2022-10-21 DIAGNOSIS — I480 Paroxysmal atrial fibrillation: Secondary | ICD-10-CM | POA: Diagnosis not present

## 2022-10-21 DIAGNOSIS — Z5181 Encounter for therapeutic drug level monitoring: Secondary | ICD-10-CM

## 2022-10-21 LAB — POCT INR: INR: 1.5 — AB (ref 2.0–3.0)

## 2022-10-21 NOTE — Patient Instructions (Signed)
Increase warfarin to 1 tablet daily except 1 1/2 tablets on Tuesdays, Thursdays and Saturdays Recheck INR in 1 week

## 2022-10-23 ENCOUNTER — Other Ambulatory Visit: Payer: Self-pay | Admitting: Family Medicine

## 2022-10-29 ENCOUNTER — Ambulatory Visit: Payer: Medicare HMO | Attending: Cardiology | Admitting: *Deleted

## 2022-10-29 DIAGNOSIS — Z5181 Encounter for therapeutic drug level monitoring: Secondary | ICD-10-CM

## 2022-10-29 DIAGNOSIS — I480 Paroxysmal atrial fibrillation: Secondary | ICD-10-CM

## 2022-10-29 LAB — POCT INR: INR: 2.4 (ref 2.0–3.0)

## 2022-10-29 NOTE — Patient Instructions (Signed)
Continue warfarin 1 tablet daily except 1 1/2 tablets on Tuesdays, Thursdays and Saturdays Recheck INR in 2 weeks

## 2022-11-04 ENCOUNTER — Telehealth: Payer: Self-pay

## 2022-11-04 ENCOUNTER — Ambulatory Visit: Payer: Medicare HMO | Admitting: Cardiology

## 2022-11-04 NOTE — Telephone Encounter (Signed)
Called to speak to patient about 1 pm appt today. Dr. Mayford Knife advises that patient needs a split night sleep study before any determination can be made about eligibility for [facility] device. Epic message to referrer about order for split night study. Call to patient to cancel visit for today and advise that she needs sleep study first, visit can be rescheduled for 2-3 months from now. Patient not available, spoke to spouse (DPR) who agreed to cancel appt and have sleep study. Mychart message also sent to patient.

## 2022-11-12 ENCOUNTER — Ambulatory Visit: Payer: Medicare HMO | Attending: Cardiology | Admitting: *Deleted

## 2022-11-12 DIAGNOSIS — Z5181 Encounter for therapeutic drug level monitoring: Secondary | ICD-10-CM

## 2022-11-12 DIAGNOSIS — I480 Paroxysmal atrial fibrillation: Secondary | ICD-10-CM

## 2022-11-12 LAB — POCT INR: INR: 2.8 (ref 2.0–3.0)

## 2022-11-12 NOTE — Patient Instructions (Signed)
Continue warfarin 1 tablet daily except 1 1/2 tablets on Tuesdays, Thursdays and Saturdays Recheck INR in 3 weeks

## 2022-11-13 ENCOUNTER — Other Ambulatory Visit: Payer: Self-pay | Admitting: Physician Assistant

## 2022-11-13 DIAGNOSIS — G4719 Other hypersomnia: Secondary | ICD-10-CM

## 2022-11-17 ENCOUNTER — Other Ambulatory Visit: Payer: Self-pay | Admitting: Family Medicine

## 2022-11-19 ENCOUNTER — Other Ambulatory Visit: Payer: Self-pay | Admitting: Family Medicine

## 2022-11-23 MED ORDER — LOSARTAN POTASSIUM 100 MG PO TABS: 100.0000 mg | ORAL_TABLET | Freq: Every day | ORAL | 0 refills | Status: DC

## 2022-11-28 ENCOUNTER — Other Ambulatory Visit: Payer: Self-pay

## 2022-11-28 ENCOUNTER — Encounter: Payer: Self-pay | Admitting: Family Medicine

## 2022-11-28 MED ORDER — BUDESONIDE-FORMOTEROL FUMARATE 160-4.5 MCG/ACT IN AERO: 2.0000 | INHALATION_SPRAY | Freq: Two times a day (BID) | RESPIRATORY_TRACT | 3 refills | Status: DC

## 2022-12-04 ENCOUNTER — Ambulatory Visit: Payer: Medicare HMO | Attending: Cardiology | Admitting: *Deleted

## 2022-12-04 DIAGNOSIS — Z5181 Encounter for therapeutic drug level monitoring: Secondary | ICD-10-CM | POA: Diagnosis not present

## 2022-12-04 DIAGNOSIS — I480 Paroxysmal atrial fibrillation: Secondary | ICD-10-CM

## 2022-12-04 LAB — POCT INR: POC INR: 2

## 2022-12-04 NOTE — Patient Instructions (Signed)
Description   Take 2 tablets today of warfarin then continue warfarin 1 tablet daily except 1 1/2 tablets on Tuesdays, Thursdays and Saturdays Recheck INR in 4 weeks

## 2022-12-17 ENCOUNTER — Encounter: Payer: Self-pay | Admitting: Cardiology

## 2022-12-17 ENCOUNTER — Ambulatory Visit: Payer: Medicare HMO | Admitting: Cardiology

## 2022-12-19 ENCOUNTER — Encounter: Payer: Self-pay | Admitting: Family Medicine

## 2022-12-25 ENCOUNTER — Ambulatory Visit: Payer: Medicare HMO | Admitting: Family Medicine

## 2022-12-29 ENCOUNTER — Encounter: Payer: Self-pay | Admitting: Internal Medicine

## 2022-12-29 ENCOUNTER — Encounter: Payer: Self-pay | Admitting: Family Medicine

## 2022-12-29 ENCOUNTER — Ambulatory Visit (INDEPENDENT_AMBULATORY_CARE_PROVIDER_SITE_OTHER): Payer: Medicare HMO | Admitting: Internal Medicine

## 2022-12-29 VITALS — BP 121/74 | HR 85 | Ht 64.0 in | Wt 307.0 lb

## 2022-12-29 DIAGNOSIS — Z794 Long term (current) use of insulin: Secondary | ICD-10-CM | POA: Diagnosis not present

## 2022-12-29 DIAGNOSIS — E11622 Type 2 diabetes mellitus with other skin ulcer: Secondary | ICD-10-CM | POA: Diagnosis not present

## 2022-12-29 DIAGNOSIS — E1169 Type 2 diabetes mellitus with other specified complication: Secondary | ICD-10-CM

## 2022-12-29 DIAGNOSIS — L97929 Non-pressure chronic ulcer of unspecified part of left lower leg with unspecified severity: Secondary | ICD-10-CM

## 2022-12-29 DIAGNOSIS — I10 Essential (primary) hypertension: Secondary | ICD-10-CM | POA: Diagnosis not present

## 2022-12-29 MED ORDER — MUPIROCIN 2 % EX OINT
1.0000 | TOPICAL_OINTMENT | Freq: Two times a day (BID) | CUTANEOUS | 0 refills | Status: DC
Start: 2022-12-29 — End: 2024-02-17

## 2022-12-29 MED ORDER — CEPHALEXIN 500 MG PO CAPS
500.0000 mg | ORAL_CAPSULE | Freq: Three times a day (TID) | ORAL | 0 refills | Status: DC
Start: 2022-12-29 — End: 2023-04-14

## 2022-12-29 NOTE — Progress Notes (Signed)
Acute Office Visit  Subjective:    Patient ID: Alexis [name], female    DOB: 01-06-1956, 67 y.o.   MRN: [id number]  Chief Complaint  Patient presents with   Wound Check    Open wound on left leg     HPI Patient is in today for concern of slowly healing left leg wound, that she noticed 2 weeks ago. She has been applying mupirocin ointment and has been dressing it with peroxide.  She states that the wound has been healing slowly now.  She has mild oozing from the site, but denies any pus discharge.  Denies any fever or chills.  She has chronic leg swelling, which led to blisters and upon rupture of the blisters, she had this wound.  She has history of of type II DM, and has been prescribed metformin and Rybelsus.  She did not tolerate metformin.  She was concerned about Cancer risk of Rybelsus and they did not take it.  After discussion of its potential side effects and benefits, she agrees to start taking it.  Past Medical History:  Diagnosis Date   Asthma 01/21/2013   Dr. Sherene Sires   Endometrial cancer [facility]) 01/21/2013   Dr. Clifton James, stage I-10/14   HTN (hypertension)    Morbid obesity (HCC)    Paroxysmal A-fib (HCC)    Uterine cancer (HCC)     Past Surgical History:  Procedure Laterality Date   ABLATION     APPENDECTOMY     DILATION AND CURETTAGE OF UTERUS     NASAL SINUS SURGERY      Family History  Problem Relation Age of Onset   Heart attack Father    Hypertension Father    Diabetes Father    Hyperlipidemia Unknown     Social History   Socioeconomic History   Marital status: Married    Spouse name: Not on file   Number of children: Not on file   Years of education: Not on file   Highest education level: Not on file  Occupational History   Not on file  Tobacco Use   Smoking status: Never   Smokeless tobacco: Never  Substance and Sexual Activity   Alcohol use: No   Drug use: Not on file   Sexual activity: Not on file  Other Topics Concern   Not on file   Social History Narrative   Not on file   Social Determinants of Health   Financial Resource Strain: Not on file  Food Insecurity: Not on file  Transportation Needs: Not on file  Physical Activity: Not on file  Stress: Not on file  Social Connections: Not on file  Intimate Partner Violence: Not on file    Outpatient Medications Prior to Visit  Medication Sig Dispense Refill   acetaminophen (TYLENOL) 500 MG tablet Take 500 mg by mouth 3 times/day as needed-between meals & bedtime for moderate pain. Pt takes 1000 mg 2 tablet bid     albuterol (VENTOLIN HFA) 108 (90 Base) MCG/ACT inhaler Inhale 2 puffs into the lungs every 4 (four) hours as needed for wheezing. 18 g 3   Blood Glucose Monitoring Suppl DEVI 1 each by Does not apply route in the morning, at noon, and at bedtime. May substitute to any manufacturer covered by patient's insurance. 1 each 0   budesonide-formoterol (SYMBICORT) 160-4.5 MCG/ACT inhaler Inhale 2 puffs into the lungs 2 (two) times daily. 3 each 3   Calcium Carbonate (CALCIUM 600 PO) Take by mouth. 2 chewables by mouth  daily- Bariatric fusion     Cholecalciferol 1.25 MG (50000 UT) capsule Take by mouth.     digoxin (LANOXIN) 0.125 MG tablet Take 1 tablet (0.125 mg total) by mouth daily. 90 tablet 3   empagliflozin (JARDIANCE) 10 MG TABS tablet Take 1 tablet (10 mg total) by mouth daily before breakfast. 30 tablet 3   famotidine (PEPCID) 20 MG tablet TAKE 1 TABLET BY MOUTH TWICE A DAY 45 tablet 3   furosemide (LASIX) 40 MG tablet Take 40 mg by mouth as needed.     Glucose Blood (BLOOD GLUCOSE TEST STRIPS) STRP 1 each by In Vitro route in the morning, at noon, and at bedtime. May substitute to any manufacturer covered by patient's insurance. 90 each 3   levocetirizine (XYZAL) 5 MG tablet Take 1 tablet (5 mg total) by mouth every evening. 30 tablet 1   losartan (COZAAR) 100 MG tablet Take 1 tablet (100 mg total) by mouth daily. 90 tablet 0   metoprolol succinate  (TOPROL-XL) 25 MG 24 hr tablet TAKE 1 TABLET BY MOUTH TWICE A DAY 60 tablet 2   montelukast (SINGULAIR) 10 MG tablet TAKE 1 TABLET BY MOUTH EVERY DAY 90 tablet 0   Multiple Vitamin (MULTIVITAMIN) tablet Take 1 tablet by mouth daily. Bariatric fusion     rosuvastatin (CRESTOR) 5 MG tablet Take 1 tablet (5 mg total) by mouth daily. 90 tablet 3   RYBELSUS 7 MG TABS Take 1 tablet by mouth daily. (Patient not taking: Reported on 09/11/2022)     TIADYLT ER 240 MG 24 hr capsule Take by mouth daily.     warfarin (COUMADIN) 5 MG tablet Take 1 tablet (5 mg total) by mouth daily. Will take along with eliquis for 3 days and then discontinue the eliquis after the 3 days 90 tablet 1   cephALEXin (KEFLEX) 500 MG capsule Take 1 capsule (500 mg total) by mouth 4 (four) times daily. 26 capsule 0   COMIRNATY syringe      FLUAD QUADRIVALENT 0.5 ML injection      metFORMIN (GLUCOPHAGE-XR) 500 MG 24 hr tablet TAKE 1 TABLET BY MOUTH EVERY DAY WITH BREAKFAST (Patient not taking: Reported on 09/11/2022) 30 tablet 2   No facility-administered medications prior to visit.    Allergies  Allergen Reactions   Ace Inhibitors     Cough / "congestion"   Ciprofloxacin    Cocoa    Flavoring Agent     Makes mouth numb   Prednisone Other (See Comments)    Raises her blood pressure   Sulfa Antibiotics Nausea And Vomiting    Other reaction(s): Nausea And Vomiting   Sulfamethoxazole Nausea Only    Review of Systems  Constitutional:  Negative for chills and fever.  HENT:  Negative for congestion, sinus pressure, sinus pain and sore throat.   Eyes:  Negative for pain and discharge.  Respiratory:  Negative for cough and shortness of breath.   Cardiovascular:  Positive for leg swelling. Negative for chest pain and palpitations.  Gastrointestinal:  Negative for abdominal pain, diarrhea, nausea and vomiting.  Endocrine: Negative for polydipsia and polyuria.  Genitourinary:  Negative for dysuria and hematuria.  Musculoskeletal:   Positive for arthralgias. Negative for neck pain and neck stiffness.  Skin:  Positive for wound (Left leg). Negative for rash.  Neurological:  Negative for dizziness and weakness.  Psychiatric/Behavioral:  Negative for agitation and behavioral problems.        Objective:    Physical Exam Vitals reviewed.  Constitutional:      General: She is not in acute distress.    Appearance: She is obese. She is not diaphoretic.     Comments: In wheelchair  HENT:     Head: Normocephalic and atraumatic.  Eyes:     General: No scleral icterus.    Extraocular Movements: Extraocular movements intact.  Cardiovascular:     Rate and Rhythm: Normal rate and regular rhythm.     Heart sounds: Normal heart sounds. No murmur heard. Pulmonary:     Breath sounds: Normal breath sounds. No wheezing or rales.  Musculoskeletal:     Cervical back: Neck supple. No tenderness.     Right lower leg: No edema.     Left lower leg: No edema.  Skin:    General: Skin is warm.     Findings: No rash.     Comments: 2 wounds on left leg - about 2 cm and 4 cm in diameter, with healthy granulation tissue, mild oozing of blood, no pus discharge - new dressing placed after exam  Neurological:     General: No focal deficit present.     Mental Status: She is alert and oriented to person, place, and time.  Psychiatric:        Mood and Affect: Mood normal.        Behavior: Behavior normal.     BP 121/74 (BP Location: Left Arm, Patient Position: Sitting, Cuff Size: Large)   Pulse 85   Ht 5\' 4"  (1.626 m)   Wt (!) 307 lb (139.3 kg)   SpO2 96%   BMI 52.70 kg/m  Wt Readings from Last 3 Encounters:  12/29/22 (!) 307 lb (139.3 kg)  09/11/22 294 lb (133.4 kg)  08/26/22 294 lb 12.8 oz (133.7 kg)        Assessment & Plan:   Problem List Items Addressed This Visit       Cardiovascular and Mediastinum   Essential hypertension    BP Readings from Last 1 Encounters:  12/29/22 121/74   Well-controlled with  Losartan Counseled for compliance with the medications Advised DASH diet and moderate exercise/walking as tolerated         Endocrine   Type 2 diabetes mellitus (HCC)    Lab Results  Component Value Date   HGBA1C 6.5 (H) 10/06/2022    New onset recently Associated with HTN and A Fib Agrees to start taking Rybelsus now Did not tolerate metformin Advised to follow diabetic diet On ARB      Diabetic ulcer of left lower leg (HCC) - Primary    Has slow healing ulcer, but only superficial now - intact DPA pulse We will need close monitoring of the wound as she prefers to do home wound care by herself Started Keflex for bacterial Ppx Mupirocin ointment prescribed, can use zinc oxide for dressing Keep area clean and dry Leg elevation for leg swelling If nonhealed ulcer after 2 weeks, consider wound care clinic referral      Relevant Medications   cephALEXin (KEFLEX) 500 MG capsule   mupirocin ointment (BACTROBAN) 2 %     Meds ordered this encounter  Medications   cephALEXin (KEFLEX) 500 MG capsule    Sig: Take 1 capsule (500 mg total) by mouth 3 (three) times daily.    Dispense:  21 capsule    Refill:  0   mupirocin ointment (BACTROBAN) 2 %    Sig: Apply 1 Application topically 2 (two) times daily.    Dispense:  22 g    Refill:  0     William Laske Concha Se, MD

## 2022-12-29 NOTE — Assessment & Plan Note (Addendum)
Has slow healing ulcer, but only superficial now - intact DPA pulse We will need close monitoring of the wound as she prefers to do home wound care by herself Started Keflex for bacterial Ppx Mupirocin ointment prescribed, can use zinc oxide for dressing Keep area clean and dry Leg elevation for leg swelling If nonhealed ulcer after 2 weeks, consider wound care clinic referral

## 2022-12-29 NOTE — Assessment & Plan Note (Signed)
BP Readings from Last 1 Encounters:  12/29/22 121/74   Well-controlled with Losartan Counseled for compliance with the medications Advised DASH diet and moderate exercise/walking as tolerated

## 2022-12-29 NOTE — Patient Instructions (Signed)
Please start taking Cephalexin as prescribed.  Please apply Mupirocin for wound.  Please start taking Rybelsus as discussed.

## 2022-12-29 NOTE — Assessment & Plan Note (Addendum)
Lab Results  Component Value Date   HGBA1C 6.5 (H) 10/06/2022    New onset recently Associated with HTN and A Fib Agrees to start taking Rybelsus now Did not tolerate metformin Advised to follow diabetic diet On ARB

## 2023-01-01 ENCOUNTER — Ambulatory Visit: Payer: Medicare HMO | Attending: Cardiology | Admitting: *Deleted

## 2023-01-01 DIAGNOSIS — Z5181 Encounter for therapeutic drug level monitoring: Secondary | ICD-10-CM | POA: Diagnosis not present

## 2023-01-01 DIAGNOSIS — I480 Paroxysmal atrial fibrillation: Secondary | ICD-10-CM | POA: Diagnosis not present

## 2023-01-01 LAB — POCT INR: INR: 4.4 — AB (ref 2.0–3.0)

## 2023-01-01 NOTE — Patient Instructions (Signed)
On Cipro 3 x daily x 7 days.  Has 4 days left. Hold warfarin tonight and tomorrow night, take 1 tablet on Saturday then resume 1 tablet daily except 1 1/2 tablets on Tuesdays, Thursdays and Saturdays Recheck INR in 2 weeks

## 2023-01-08 NOTE — Progress Notes (Deleted)
Electrophysiology Office Note:    Date:  01/08/2023   ID:  Alexis [name], DOB May 13, 1955, MRN [id number]  [facility] Cardiologist:  Donato Schultz, MD  [facility] Electrophysiologist:  Lanier Prude, MD   Referring MD: Wylene Men*   Chief Complaint: Atrial fibrillation  History of Present Illness:    Alexis [name] is a 67 y.o. femalewho I am seeing today for an evaluation of atrial fibrillation at the request of Jari Favre, PA-C.  The patient was last seen by Julian Hy on Aug 26, 2022.  The patient has a medical history that includes paroxysmal atrial fibrillation, endometrial cancer, uterine cancer, hypertension.  The patient has had 15 prior cardioversions according to the last note from [place].  She was recently changed to Coumadin and is being referred to discuss watchman.  She has previously expressed an interest in avoiding invasive procedures.        Their past medical, social and family history was reveiwed.   ROS:   Please see the history of present illness.    All other systems reviewed and are negative.  EKGs/Labs/Other Studies Reviewed:    The following studies were reviewed today:  This December 17, 2021 echo  EF 55%.  RV low normal.  Mild MR  Aug 26, 2022 EKG shows atrial fibrillation with a ventricular rate of 129 bpm  August 03, 2020 EKG shows sinus rhythm      Physical Exam:    VS:  There were no vitals taken for this visit.    Wt Readings from Last 3 Encounters:  12/29/22 (!) 307 lb (139.3 kg)  09/11/22 294 lb (133.4 kg)  08/26/22 294 lb 12.8 oz (133.7 kg)     GEN: *** Well nourished, well developed in no acute distress CARDIAC: ***RRR, no murmurs, rubs, gallops RESPIRATORY:  Clear to auscultation without rales, wheezing or rhonchi       ASSESSMENT AND PLAN:    1. Persistent atrial fibrillation (HCC)   2. Morbid obesity (HCC)     #Persistent atrial fibrillation Possibly heading towards permanent atrial  fibrillation.  Has failed many prior cardioversions.  I discussed treatment options with the patient during today's clinic appointment including antiarrhythmic drug therapy, catheter ablation, rate control.  Given her history, I think a rate control strategy would be best.  Her body weight prevents safe/effective catheter ablation.  Continue Coumadin   Continue digoxin, metoprolol.  Increase Toprol to 50 mg by mouth twice daily.***  #Obesity Discussed link between increased body weight and A-fib treatment success rates.        Signed, Rossie Muskrat. Lalla Brothers, MD, [facility], [facility] 01/08/2023 9:40 PM    Electrophysiology [facility]

## 2023-01-09 ENCOUNTER — Ambulatory Visit: Payer: Medicare HMO | Admitting: Cardiology

## 2023-01-09 ENCOUNTER — Telehealth: Payer: Self-pay

## 2023-01-09 DIAGNOSIS — I4819 Other persistent atrial fibrillation: Secondary | ICD-10-CM

## 2023-01-09 NOTE — Telephone Encounter (Signed)
The patient called today to cancel her same day appointment. She told the scheduler she did not want to consider/pursue LAAO.   Will route to referring provider as FYI.

## 2023-01-12 ENCOUNTER — Encounter: Payer: Self-pay | Admitting: Family Medicine

## 2023-01-12 ENCOUNTER — Ambulatory Visit: Payer: Medicare HMO | Admitting: Family Medicine

## 2023-01-12 VITALS — BP 125/71 | HR 74 | Ht 64.0 in | Wt 308.0 lb

## 2023-01-12 DIAGNOSIS — R1084 Generalized abdominal pain: Secondary | ICD-10-CM

## 2023-01-12 DIAGNOSIS — E119 Type 2 diabetes mellitus without complications: Secondary | ICD-10-CM | POA: Diagnosis not present

## 2023-01-12 DIAGNOSIS — L97929 Non-pressure chronic ulcer of unspecified part of left lower leg with unspecified severity: Secondary | ICD-10-CM

## 2023-01-12 DIAGNOSIS — E1169 Type 2 diabetes mellitus with other specified complication: Secondary | ICD-10-CM

## 2023-01-12 DIAGNOSIS — E11622 Type 2 diabetes mellitus with other skin ulcer: Secondary | ICD-10-CM

## 2023-01-12 DIAGNOSIS — L259 Unspecified contact dermatitis, unspecified cause: Secondary | ICD-10-CM

## 2023-01-12 DIAGNOSIS — I10 Essential (primary) hypertension: Secondary | ICD-10-CM

## 2023-01-12 DIAGNOSIS — R109 Unspecified abdominal pain: Secondary | ICD-10-CM | POA: Insufficient documentation

## 2023-01-12 DIAGNOSIS — Z794 Long term (current) use of insulin: Secondary | ICD-10-CM | POA: Diagnosis not present

## 2023-01-12 DIAGNOSIS — Z0100 Encounter for examination of eyes and vision without abnormal findings: Secondary | ICD-10-CM

## 2023-01-12 MED ORDER — HYDROXYZINE PAMOATE 25 MG PO CAPS: 25.0000 mg | ORAL_CAPSULE | Freq: Three times a day (TID) | ORAL | 0 refills | Status: DC | PRN

## 2023-01-12 NOTE — Assessment & Plan Note (Signed)
Left arm erythematosus rash  Trial on hydroxyzine 25 mg  And Kenalog ointment

## 2023-01-12 NOTE — Patient Instructions (Signed)

## 2023-01-12 NOTE — Assessment & Plan Note (Addendum)
Last Hemoglobin A1C 6.5 Labs ordered today awaiting results will follow up. Patient reporting taking Rybelsus 7 mg once daily,Discussed  Nonpharmacological interventions such as low carb diet,high in protein, vegetables and fruit discussed. Educated on importance of physical activity 150 minutes per week. Discussed signs and symptoms of hypoglycemia, & hyperglycemia and need to present to the ED if symptoms occurs.Follow up in 3 months or sooner if needed. Patient verbalizes understanding regarding plan of care and all questions answered. Ophthalmology referral placed , Foot exam: Left leg diabetic ulcer now healing and completed antibiotic Keflex course. Referral placed to Podiatry

## 2023-01-12 NOTE — Progress Notes (Signed)
Patient Office Visit   Subjective   Patient ID: Alexis [name], female    DOB: 07-15-1955  Age: 67 y.o. MRN: [id number]  CC:  Chief Complaint  Patient presents with   Diabetes    Patient is here for DM f/u. And f/u of wound on L leg.    Rash    Patient complains of rash on both arms that is worsening.     HPI Alexis [name] 67 year old female, presents to the clinic for chronic follow up. She  has a past medical history of Asthma (01/21/2013), Endometrial cancer (HCC) (01/21/2013), HTN (hypertension), Morbid obesity (HCC), Paroxysmal A-fib (HCC), and Uterine cancer (HCC).  Abdominal Pain This is a recurrent problem. The problem occurs constantly. The problem has been gradually improving. The pain is located in the RLQ. The pain is at a severity of 2/10. The quality of the pain is aching. The abdominal pain radiates to the RLQ. Associated symptoms include belching. Pertinent negatives include no dysuria or fever. The pain is aggravated by movement. Relieved by: Gas x. Treatments tried: Gas x. The treatment provided significant relief.      Outpatient Encounter Medications as of 01/12/2023  Medication Sig   Accu-Chek Softclix Lancets lancets    acetaminophen (TYLENOL) 500 MG tablet Take 500 mg by mouth 3 times/day as needed-between meals & bedtime for moderate pain. Pt takes 1000 mg 2 tablet bid   albuterol (VENTOLIN HFA) 108 (90 Base) MCG/ACT inhaler Inhale 2 puffs into the lungs every 4 (four) hours as needed for wheezing.   Blood Glucose Monitoring Suppl DEVI 1 each by Does not apply route in the morning, at noon, and at bedtime. May substitute to any manufacturer covered by patient's insurance.   budesonide-formoterol (SYMBICORT) 160-4.5 MCG/ACT inhaler Inhale 2 puffs into the lungs 2 (two) times daily.   Calcium Carbonate (CALCIUM 600 PO) Take by mouth. 2 chewables by mouth daily- Bariatric fusion   cephALEXin (KEFLEX) 500 MG capsule Take 1 capsule (500 mg total) by mouth 3 (three)  times daily.   Cholecalciferol 1.25 MG (50000 UT) capsule Take by mouth.   digoxin (LANOXIN) 0.125 MG tablet Take 1 tablet (0.125 mg total) by mouth daily.   famotidine (PEPCID) 20 MG tablet TAKE 1 TABLET BY MOUTH TWICE A DAY   furosemide (LASIX) 40 MG tablet Take 40 mg by mouth as needed.   hydrOXYzine (VISTARIL) 25 MG capsule Take 1 capsule (25 mg total) by mouth every 8 (eight) hours as needed.   levocetirizine (XYZAL) 5 MG tablet Take 1 tablet (5 mg total) by mouth every evening.   losartan (COZAAR) 100 MG tablet Take 1 tablet (100 mg total) by mouth daily.   metoprolol succinate (TOPROL-XL) 25 MG 24 hr tablet TAKE 1 TABLET BY MOUTH TWICE A DAY   montelukast (SINGULAIR) 10 MG tablet TAKE 1 TABLET BY MOUTH EVERY DAY   Multiple Vitamin (MULTIVITAMIN) tablet Take 1 tablet by mouth daily. Bariatric fusion   mupirocin ointment (BACTROBAN) 2 % Apply 1 Application topically 2 (two) times daily.   rosuvastatin (CRESTOR) 5 MG tablet Take 1 tablet (5 mg total) by mouth daily.   RYBELSUS 7 MG TABS Take 1 tablet by mouth daily.   TIADYLT ER 240 MG 24 hr capsule Take by mouth daily.   warfarin (COUMADIN) 5 MG tablet Take 1 tablet (5 mg total) by mouth daily. Will take along with eliquis for 3 days and then discontinue the eliquis after the 3 days   No facility-administered  encounter medications on file as of 01/12/2023.    Past Surgical History:  Procedure Laterality Date   ABLATION     APPENDECTOMY     DILATION AND CURETTAGE OF UTERUS     NASAL SINUS SURGERY      Review of Systems  Constitutional:  Negative for chills and fever.  Respiratory:  Negative for shortness of breath.   Gastrointestinal:  Positive for abdominal pain.  Genitourinary:  Negative for dysuria.  Skin:  Positive for itching and rash.      Objective    BP 125/71   Pulse 74   Ht 5\' 4"  (1.626 m)   Wt (!) 308 lb (139.7 kg)   SpO2 94%   BMI 52.87 kg/m   Physical Exam Vitals reviewed.  Constitutional:       General: She is not in acute distress.    Appearance: Normal appearance. She is not ill-appearing, toxic-appearing or diaphoretic.  HENT:     Head: Normocephalic.  Eyes:     General:        Right eye: No discharge.        Left eye: No discharge.     Conjunctiva/sclera: Conjunctivae normal.  Cardiovascular:     Rate and Rhythm: Normal rate.     Pulses: Normal pulses.     Heart sounds: Normal heart sounds.  Pulmonary:     Effort: Pulmonary effort is normal. No respiratory distress.     Breath sounds: Normal breath sounds.  Musculoskeletal:        General: Normal range of motion.     Cervical back: Normal range of motion.  Skin:    Capillary Refill: Capillary refill takes less than 2 seconds.     Findings: Erythema present.  Neurological:     Mental Status: She is alert.  Psychiatric:        Mood and Affect: Mood normal.        Behavior: Behavior normal.       Assessment & Plan:  Type 2 diabetes mellitus with other specified complication, with long-term current use of insulin (HCC) Assessment & Plan: Last Hemoglobin A1C 6.5 Labs ordered today awaiting results will follow up. Patient reporting taking Rybelsus 7 mg once daily,Discussed  Nonpharmacological interventions such as low carb diet,high in protein, vegetables and fruit discussed. Educated on importance of physical activity 150 minutes per week. Discussed signs and symptoms of hypoglycemia, & hyperglycemia and need to present to the ED if symptoms occurs.Follow up in 3 months or sooner if needed. Patient verbalizes understanding regarding plan of care and all questions answered. Ophthalmology referral placed , Foot exam: Left leg diabetic ulcer now healing and completed antibiotic Keflex course. Referral placed to Podiatry    Orders: -     Hemoglobin A1c -     Ambulatory referral to Ophthalmology -     Ambulatory referral to Podiatry -     Ambulatory referral to Wound Clinic  Diabetic eye exam [facility]) -     Ambulatory  referral to Ophthalmology  Primary hypertension -     Lipid panel -     BMP8+eGFR -     CBC with Differential/Platelet  Generalized abdominal pain Assessment & Plan: Related to Gas Continue with Simethicone Advice to avoid Gas-Producing Foods: Reduce intake of foods that are known to cause gas, such as: Beans and legumes Cruciferous vegetables (broccoli, cabbage, cauliflower) Carbonated beverages Artificial sweeteners (sorbitol, mannitol) Eat Slowly: Eating too quickly can lead to swallowing excess air, which contributes to  gas. Take your time and chew your food thoroughly. Smaller, More Frequent Meals: Large meals can overload the digestive system. Eating smaller meals more frequently can help reduce gas pain   Contact dermatitis, unspecified contact dermatitis type, unspecified trigger Assessment & Plan: Left arm erythematosus rash  Trial on hydroxyzine 25 mg  And Kenalog ointment    Diabetic ulcer of left lower leg (HCC) Assessment & Plan: Approximately 3 cm, and 4 cm in diameter Healthy granulation tissue with minimal serous drainage, no signs of infection or pus discharge. Surrounding skin is intact with no erythema or swelling observed. Patient completed abx course of Keflex    Other orders -     hydrOXYzine Pamoate; Take 1 capsule (25 mg total) by mouth every 8 (eight) hours as needed.  Dispense: 30 capsule; Refill: 0    Return in about 4 months (around 05/15/2023), or if symptoms worsen or fail to improve, for type 2 diabetes.   [name], FNP

## 2023-01-12 NOTE — Assessment & Plan Note (Signed)
Approximately 3 cm, and 4 cm in diameter Healthy granulation tissue with minimal serous drainage, no signs of infection or pus discharge. Surrounding skin is intact with no erythema or swelling observed. Patient completed abx course of Keflex

## 2023-01-12 NOTE — Assessment & Plan Note (Signed)
Related to Gas Continue with Simethicone Advice to avoid Gas-Producing Foods: Reduce intake of foods that are known to cause gas, such as: Beans and legumes Cruciferous vegetables (broccoli, cabbage, cauliflower) Carbonated beverages Artificial sweeteners (sorbitol, mannitol) Eat Slowly: Eating too quickly can lead to swallowing excess air, which contributes to gas. Take your time and chew your food thoroughly. Smaller, More Frequent Meals: Large meals can overload the digestive system. Eating smaller meals more frequently can help reduce gas pain

## 2023-01-15 ENCOUNTER — Ambulatory Visit: Payer: Medicare HMO

## 2023-01-15 ENCOUNTER — Ambulatory Visit (HOSPITAL_COMMUNITY): Payer: Medicare HMO | Attending: Family Medicine | Admitting: Physical Therapy

## 2023-01-15 ENCOUNTER — Other Ambulatory Visit: Payer: Self-pay

## 2023-01-15 DIAGNOSIS — T8789 Other complications of amputation stump: Secondary | ICD-10-CM | POA: Insufficient documentation

## 2023-01-15 DIAGNOSIS — S81802S Unspecified open wound, left lower leg, sequela: Secondary | ICD-10-CM | POA: Insufficient documentation

## 2023-01-15 DIAGNOSIS — I890 Lymphedema, not elsewhere classified: Secondary | ICD-10-CM | POA: Insufficient documentation

## 2023-01-15 DIAGNOSIS — M79662 Pain in left lower leg: Secondary | ICD-10-CM | POA: Insufficient documentation

## 2023-01-15 NOTE — Therapy (Addendum)
OUTPATIENT PHYSICAL THERAPY wound EVALUATION   Patient Name: Alexis [name] MRN: [id number] DOB:[birth year], 67 y.o., female Today's Date: 01/15/2023   PCP: Rica Records, FNP REFERRING PROVIDER: Rica Records, FNP  END OF SESSION:  PT End of Session - 01/15/23 1446     Visit Number 1    Number of Visits 12    Date for PT Re-Evaluation 02/26/23    Authorization Type [employer] Auth put in    PT Start Time 1301    PT Stop Time 1340    PT Time Calculation (min) 39 min    Activity Tolerance Patient tolerated treatment well    Behavior During Therapy [facility] for tasks assessed/performed             Past Medical History:  Diagnosis Date   Asthma 01/21/2013   Dr. Sherene Sires   Endometrial cancer (HCC) 01/21/2013   Dr. Clifton James, stage I-10/14   HTN (hypertension)    Morbid obesity (HCC)    Paroxysmal A-fib (HCC)    Uterine cancer (HCC)    Past Surgical History:  Procedure Laterality Date   ABLATION     APPENDECTOMY     DILATION AND CURETTAGE OF UTERUS     NASAL SINUS SURGERY     Patient Active Problem List   Diagnosis Date Noted   Abdominal pain 01/12/2023   Contact dermatitis 01/12/2023   Diabetic ulcer of left lower leg (HCC) 12/29/2022   Type 2 diabetes mellitus (HCC) 09/11/2022   Pap smear for cervical cancer screening 05/21/2022   Cellulitis of lower leg 04/27/2022   Upper airway cough syndrome 02/16/2019   History of cardioversion 01/27/2019   Osteoarthritis 07/06/2017   BMI 50.0-59.9, adult (HCC) 09/12/2016   Gastroesophageal reflux disease without esophagitis 09/04/2016   Long term (current) use of anticoagulants 07/09/2016   Primary osteoarthritis of both knees 07/02/2016   Controlled substance agreement signed 06/06/2016   Knee pain, bilateral 02/25/2016   Major depressive disorder, recurrent, in full remission (HCC) 02/25/2016   CKD (chronic kidney disease) stage 3, GFR 30-59 ml/min (HCC) 02/11/2016   Leg swelling 02/11/2016   History of  adenomatous polyp of colon 01/26/2016   Chronic pain 10/18/2015   Asthma, intermittent, uncomplicated 12/19/2014   Hypersomnolence 08/24/2013   History of endometrial cancer 04/07/2013   Asthma, mild persistent with major component of UACS worse on ACEi  01/21/2013   Endometrial cancer (HCC) 01/21/2013   Pre-operative cardiovascular examination 01/21/2013   Paroxysmal A-fib (HCC)    Morbid (severe) obesity due to excess calories (HCC)    Essential hypertension     ONSET DATE: 12/18/22  REFERRING DIAG: W09.81,X91.4 (ICD-10-CM) - Type 2 diabetes mellitus with other specified complication, with long-term current use of insulin (HCC)  THERAPY DIAG:  Lymphedema, not elsewhere classified  Leg wound, left, sequela  Pain in left lower leg  Rationale for Evaluation and Treatment: Rehabilitation     Wound Therapy - 01/15/23 0001     Subjective Pt staes that her leg started swelling, then weeping and then she noted the wounds on the front of her left leg.  States the wounds have been there for about four weeks.    Patient and Family Stated Goals wound to heal    Date of Onset 12/18/22   approximate   Prior Treatments antibiotic, self care    Pain Scale 0-10    Pain Score 2     Pain Type Acute pain    Pain Location Leg  Pain Orientation Left;Anterior    Pain Descriptors / Indicators Aching    Pain Onset On-going    Patients Stated Pain Goal 0    Pain Intervention(s) Emotional support    Evaluation and Treatment Procedures Explained to Patient/Family Yes    Evaluation and Treatment Procedures agreed to    Wound Properties Date First Assessed: 01/15/23 Time First Assessed: 1305 Wound Type: Other (Comment) Location: Pretibial Location Orientation: Left Wound Description (Comments): superior wound Present on Admission: Yes   Wound Image Images linked: 1    Dressing Type Gauze (Comment)    Dressing Changed Changed    Dressing Status New drainage    Dressing Change Frequency PRN     Site / Wound Assessment Granulation tissue;Other (Comment)   layer of biofilm   % Wound base Red or Granulating 70%    % Wound base Other/Granulation Tissue (Comment) 30%   biofilm   Peri-wound Assessment Edema;Erythema (non-blanchable)    Wound Length (cm) 1 cm    Wound Width (cm) 1.5 cm    Wound Surface Area (cm^2) 1.5 cm^2    Closure None    Drainage Amount Moderate    Drainage Description Serous    Treatment Cleansed;Debridement (Selective);Other (Comment)   compression dressing   Wound Properties Date First Assessed: 01/15/23 Time First Assessed: 1320 Wound Type: Other (Comment) Location: Pretibial Location Orientation: Left Wound Description (Comments): Distal and larger wound Present on Admission: Yes   Dressing Type Gauze (Comment)    Dressing Changed Changed    Dressing Status Old drainage    Dressing Change Frequency PRN    Site / Wound Assessment Granulation tissue;Other (Comment)   biofilm   % Wound base Red or Granulating 70%    % Wound base Yellow/Fibrinous Exudate 30%    Peri-wound Assessment Edema;Erythema (non-blanchable)    Wound Length (cm) 1.3 cm    Wound Width (cm) 3.8 cm    Wound Depth (cm) 0.2 cm    Wound Volume (cm^3) 0.99 cm^3    Wound Surface Area (cm^2) 4.94 cm^2    Margins Epibole (rolled edges)    Drainage Amount Minimal    Drainage Description Serous    Treatment Cleansed;Debridement (Selective);Other (Comment)    Selective Debridement (non-excisional) - Location wound bed    Selective Debridement (non-excisional) - Tools Used Forceps    Selective Debridement (non-excisional) - Tissue Removed biofilm    Wound Therapy - Clinical Statement see below    Wound Therapy - Functional Problem List difficulty walking due to OA, difficulty dressing due to edema    Factors Delaying/Impairing Wound Healing Altered sensation;Diabetes Mellitus;Immobility;Multiple medical problems;Polypharmacy;Vascular compromise    Hydrotherapy Plan Debridement;Dressing  change;Patient/family education;Other (comment)    Wound Therapy - Frequency 2X / week    Wound Therapy - Current Recommendations PT    Wound Plan debridement, manual, dressing change    Dressing  medihoney patch over wound followed by 4x4 and profore compression bandage system.               PATIENT EDUCATION: Education details: Keep compression bandage dry, bandage should not be painful if it is take one layer off at a time.   Person educated: Patient and Spouse Education method: Explanation and Verbal cues Education comprehension: verbalized understanding   HOME EXERCISE PROGRAM: Ankle pumps    GOALS: Goals reviewed with patient? No  SHORT TERM GOALS: Target date: 02/05/23  PT wound to be 100% granulated Baseline: Goal status: INITIAL  2.  Wounds to have decreased size  by 50% Baseline:  Goal status: INITIAL  3.  PT to have no pain in her Lt LE  Baseline:  Goal status: INITIAL    LONG TERM GOALS: Target date: 02/26/23  Wounds to be healed to prevent cellulitis  Baseline:  Goal status: INITIAL  2.  PT to be wearing compression to keep edema under control  Baseline:  Goal status: INITIAL     ASSESSMENT:  CLINICAL IMPRESSION: Patient is a 67 y.o. female who was seen today for physical therapy evaluation and treatment for non healing Lt leg wounds.  PT states that she has been diagnosed with lymphedema.  She noted swelling in her left leg and then weeping followed by the wounds.  The wounds have been present for approximately four weeks.  Ms. Oneel will benefit from skilled PT for wound care to create a healing environment and education on how to control her edema.      OBJECTIVE IMPAIRMENTS: decreased activity tolerance, decreased mobility, difficulty walking, increased edema, impaired sensation, obesity, and pain.   ACTIVITY LIMITATIONS: carrying, lifting, bathing, dressing, hygiene/grooming, and locomotion level  PERSONAL FACTORS: 1-2  comorbidities: diabetes, obesity  are also affecting patient's functional outcome.   REHAB POTENTIAL: Good  CLINICAL DECISION MAKING: Stable/uncomplicated  EVALUATION COMPLEXITY: Low  PLAN: PT FREQUENCY: 2x/week  PT DURATION: 6 weeks  PLANNED INTERVENTIONS: 97110-Therapeutic exercises, 97535- Self Care, 16109- Manual therapy, Patient/Family education, and wound care   PLAN FOR NEXT SESSION: Give exercises for LE lymphedema, continue with wound care.   Virgina Organ, PT CLT [phone]  01/15/2023, 3:06 PM

## 2023-01-17 ENCOUNTER — Other Ambulatory Visit: Payer: Self-pay | Admitting: Family Medicine

## 2023-01-20 ENCOUNTER — Ambulatory Visit (HOSPITAL_COMMUNITY): Payer: Medicare HMO | Admitting: Physical Therapy

## 2023-01-21 ENCOUNTER — Ambulatory Visit: Payer: Medicare HMO | Attending: Cardiology | Admitting: *Deleted

## 2023-01-21 DIAGNOSIS — I480 Paroxysmal atrial fibrillation: Secondary | ICD-10-CM | POA: Diagnosis not present

## 2023-01-21 DIAGNOSIS — Z5181 Encounter for therapeutic drug level monitoring: Secondary | ICD-10-CM

## 2023-01-21 LAB — POCT INR: INR: 1.8 — AB (ref 2.0–3.0)

## 2023-01-21 NOTE — Patient Instructions (Signed)
Take warfarin 1 1/2 tablets tonight then resume 1 tablet daily except 1 1/2 tablets on Tuesdays, Thursdays and Saturdays Recheck INR in 2 weeks

## 2023-01-23 ENCOUNTER — Ambulatory Visit (HOSPITAL_COMMUNITY): Payer: Medicare HMO | Admitting: Physical Therapy

## 2023-01-25 ENCOUNTER — Other Ambulatory Visit: Payer: Self-pay | Admitting: Family Medicine

## 2023-01-26 ENCOUNTER — Ambulatory Visit: Payer: Medicare HMO | Admitting: Podiatry

## 2023-01-27 ENCOUNTER — Ambulatory Visit (HOSPITAL_COMMUNITY): Payer: Medicare HMO | Admitting: Physical Therapy

## 2023-01-27 DIAGNOSIS — T8789 Other complications of amputation stump: Secondary | ICD-10-CM | POA: Diagnosis not present

## 2023-01-27 DIAGNOSIS — M79662 Pain in left lower leg: Secondary | ICD-10-CM

## 2023-01-27 DIAGNOSIS — S81802S Unspecified open wound, left lower leg, sequela: Secondary | ICD-10-CM

## 2023-01-27 DIAGNOSIS — I890 Lymphedema, not elsewhere classified: Secondary | ICD-10-CM

## 2023-01-27 NOTE — Therapy (Signed)
OUTPATIENT PHYSICAL THERAPY wound TREATMENT Patient Name: Alexis [name] MRN: [id number] DOB:04-23-1955, 67 y.o., female Today's Date: 01/27/2023   PCP: Rica Records, FNP REFERRING PROVIDER: Rica Records, FNP  END OF SESSION:  PT End of Session - 01/27/23 1506     Visit Number 2    Number of Visits 12    Date for PT Re-Evaluation 02/26/23    Authorization Type [employer] Auth put in    PT Start Time 1305    PT Stop Time 1345    PT Time Calculation (min) 40 min    Activity Tolerance Patient tolerated treatment well    Behavior During Therapy [facility] for tasks assessed/performed             Past Medical History:  Diagnosis Date   Asthma 01/21/2013   Dr. Sherene Sires   Endometrial cancer (HCC) 01/21/2013   Dr. Clifton James, stage I-10/14   HTN (hypertension)    Morbid obesity (HCC)    Paroxysmal A-fib (HCC)    Uterine cancer (HCC)    Past Surgical History:  Procedure Laterality Date   ABLATION     APPENDECTOMY     DILATION AND CURETTAGE OF UTERUS     NASAL SINUS SURGERY     Patient Active Problem List   Diagnosis Date Noted   Abdominal pain 01/12/2023   Contact dermatitis 01/12/2023   Diabetic ulcer of left lower leg (HCC) 12/29/2022   Type 2 diabetes mellitus (HCC) 09/11/2022   Pap smear for cervical cancer screening 05/21/2022   Cellulitis of lower leg 04/27/2022   Upper airway cough syndrome 02/16/2019   History of cardioversion 01/27/2019   Osteoarthritis 07/06/2017   BMI 50.0-59.9, adult (HCC) 09/12/2016   Gastroesophageal reflux disease without esophagitis 09/04/2016   Long term (current) use of anticoagulants 07/09/2016   Primary osteoarthritis of both knees 07/02/2016   Controlled substance agreement signed 06/06/2016   Knee pain, bilateral 02/25/2016   Major depressive disorder, recurrent, in full remission (HCC) 02/25/2016   CKD (chronic kidney disease) stage 3, GFR 30-59 ml/min (HCC) 02/11/2016   Leg swelling 02/11/2016   History of  adenomatous polyp of colon 01/26/2016   Chronic pain 10/18/2015   Asthma, intermittent, uncomplicated 12/19/2014   Hypersomnolence 08/24/2013   History of endometrial cancer 04/07/2013   Asthma, mild persistent with major component of UACS worse on ACEi  01/21/2013   Endometrial cancer (HCC) 01/21/2013   Pre-operative cardiovascular examination 01/21/2013   Paroxysmal A-fib (HCC)    Morbid (severe) obesity due to excess calories (HCC)    Essential hypertension     ONSET DATE: 12/18/22  REFERRING DIAG: N02.72,Z36.6 (ICD-10-CM) - Type 2 diabetes mellitus with other specified complication, with long-term current use of insulin (HCC)  THERAPY DIAG:  Lymphedema, not elsewhere classified  Leg wound, left, sequela  Pain in left lower leg  Rationale for Evaluation and Treatment: Rehabilitation     Wound Therapy - 01/27/23 1507     Subjective Pt states she removed her bandages after 2 days because her LE's hurt and itched.  No reason given for missed appts last week.  Had cover over wounds today.    Patient and Family Stated Goals wound to heal    Date of Onset 12/18/22   approximate   Prior Treatments antibiotic, self care    Pain Scale 0-10    Pain Score 2     Pain Type Acute pain    Pain Location Leg    Pain Orientation Left  Pain Descriptors / Indicators Aching    Pain Onset On-going    Patients Stated Pain Goal 0    Evaluation and Treatment Procedures Explained to Patient/Family Yes    Evaluation and Treatment Procedures agreed to    Wound Properties Date First Assessed: 01/15/23 Time First Assessed: 1305 Wound Type: Other (Comment) Location: Pretibial Location Orientation: Left Wound Description (Comments): superior wound Present on Admission: Yes   Wound Image Images linked: 1    Dressing Type Gauze (Comment)    Dressing Changed Changed    Dressing Status New drainage    Dressing Change Frequency PRN    Site / Wound Assessment Granulation tissue;Other (Comment)   layer  of biofilm   % Wound base Red or Granulating 85%    % Wound base Yellow/Fibrinous Exudate 15%    Peri-wound Assessment Edema;Erythema (non-blanchable)    Wound Length (cm) 0.5 cm    Wound Width (cm) 1.2 cm    Wound Depth (cm) 0.2 cm    Wound Volume (cm^3) 0.12 cm^3    Wound Surface Area (cm^2) 0.6 cm^2    Closure None    Drainage Amount Minimal    Drainage Description Serosanguineous    Treatment Cleansed    Wound Properties Date First Assessed: 01/15/23 Time First Assessed: 1320 Wound Type: Other (Comment) Location: Pretibial Location Orientation: Left Wound Description (Comments): Distal and larger wound Present on Admission: Yes   Dressing Type Gauze (Comment)    Dressing Changed Changed    Dressing Status Old drainage    Dressing Change Frequency PRN    Site / Wound Assessment Granulation tissue;Other (Comment)   biofilm   % Wound base Red or Granulating 85%    % Wound base Yellow/Fibrinous Exudate 15%    Peri-wound Assessment Edema;Erythema (non-blanchable)    Wound Length (cm) 0.9 cm    Wound Width (cm) 2.8 cm    Wound Depth (cm) 0.2 cm    Wound Volume (cm^3) 0.5 cm^3    Wound Surface Area (cm^2) 2.52 cm^2    Margins Epibole (rolled edges)    Drainage Amount Minimal    Drainage Description Serous    Treatment Cleansed;Debridement (Selective)    Selective Debridement (non-excisional) - Location wound bed    Selective Debridement (non-excisional) - Tools Used Forceps    Selective Debridement (non-excisional) - Tissue Removed biofilm    Wound Therapy - Clinical Statement see below    Wound Therapy - Functional Problem List difficulty walking due to OA, difficulty dressing due to edema    Factors Delaying/Impairing Wound Healing Altered sensation;Diabetes Mellitus;Immobility;Multiple medical problems;Polypharmacy;Vascular compromise    Hydrotherapy Plan Debridement;Dressing change;Patient/family education;Other (comment)    Wound Therapy - Frequency 2X / week    Wound Therapy  - Current Recommendations PT    Wound Plan debridement, manual, dressing change    Dressing  medihoney hydrofiber, 4x4, ABD and profore compression bandage system. #5 netting               PATIENT EDUCATION: Education details: Keep compression bandage dry, bandage should not be painful if it is take one layer off at a time.   Person educated: Patient and Spouse Education method: Explanation and Verbal cues Education comprehension: verbalized understanding   HOME EXERCISE PROGRAM: Ankle pumps    GOALS: Goals reviewed with patient? No  SHORT TERM GOALS: Target date: 02/05/23  PT wound to be 100% granulated Baseline: Goal status: INITIAL  2.  Wounds to have decreased size by 50% Baseline:  Goal status: INITIAL  3.  PT to have no pain in her Lt LE  Baseline:  Goal status: INITIAL    LONG TERM GOALS: Target date: 02/26/23  Wounds to be healed to prevent cellulitis  Baseline:  Goal status: INITIAL  2.  PT to be wearing compression to keep edema under control  Baseline:  Goal status: INITIAL     ASSESSMENT:  CLINICAL IMPRESSION: Patient returns today following cancellation of both last weeks appt; non compliance with bandages, only keeping on 2 days before removing them.  Educated on Lymphedema and the importance of being compliant with compression.  Informed each incident will be worse and her LE's will continue to swell and get larger if she does not comply with treatment and compression.  PT states she will try to keep these on but her Legs begin to hurt and itch.  Instructed to heavily moisturize her LE's when bandages off and explained dry skin is the cause of itchiness.  Both wounds photographed and measured with good reduction noted and following debridement, increased granulation.  Wounds are weeping still so continued with medihoney hydrofiber prior to profore dressing for compression.  Encouraged highly to keep dressings in place.  Suggested benadryl  orally if ok with MD to take for itchiness.  LE moisturized heavily to attempt to prevent itchiness as well.  PT reported some tightness at top of bandage, however assured her therapist did not apply with pressure.        OBJECTIVE IMPAIRMENTS: decreased activity tolerance, decreased mobility, difficulty walking, increased edema, impaired sensation, obesity, and pain.   ACTIVITY LIMITATIONS: carrying, lifting, bathing, dressing, hygiene/grooming, and locomotion level  PERSONAL FACTORS: 1-2 comorbidities: diabetes, obesity  are also affecting patient's functional outcome.   REHAB POTENTIAL: Good  CLINICAL DECISION MAKING: Stable/uncomplicated  EVALUATION COMPLEXITY: Low  PLAN: PT FREQUENCY: 2x/week  PT DURATION: 6 weeks  PLANNED INTERVENTIONS: 97110-Therapeutic exercises, 97535- Self Care, 52841- Manual therapy, Patient/Family education, and wound care   PLAN FOR NEXT SESSION: Give exercises for LE lymphedema, continue with wound care.     Lurena Nida, PTA/CLT [facility] Ph: [phone]  01/27/2023, 3:49 PM

## 2023-01-30 ENCOUNTER — Ambulatory Visit (HOSPITAL_COMMUNITY): Payer: Medicare HMO

## 2023-01-30 ENCOUNTER — Encounter (HOSPITAL_COMMUNITY): Payer: Self-pay

## 2023-01-30 DIAGNOSIS — S81802S Unspecified open wound, left lower leg, sequela: Secondary | ICD-10-CM | POA: Diagnosis not present

## 2023-01-30 DIAGNOSIS — T8789 Other complications of amputation stump: Secondary | ICD-10-CM | POA: Diagnosis not present

## 2023-01-30 DIAGNOSIS — M79662 Pain in left lower leg: Secondary | ICD-10-CM

## 2023-01-30 DIAGNOSIS — I890 Lymphedema, not elsewhere classified: Secondary | ICD-10-CM

## 2023-01-30 NOTE — Therapy (Signed)
OUTPATIENT PHYSICAL THERAPY wound TREATMENT Patient Name: Alexis [name] MRN: [id number] DOB:1955/11/28, 67 y.o., female Today's Date: 01/30/2023   PCP: Rica Records, FNP REFERRING PROVIDER: Rica Records, FNP  END OF SESSION:  PT End of Session - 01/30/23 1502     Visit Number 3    Number of Visits 12    Date for PT Re-Evaluation 02/26/23    Authorization Type [employer]    Authorization Time Period approved 12 visits 10/10-->02/26/23    Authorization - Visit Number 3    Authorization - Number of Visits 12    Progress Note Due on Visit 10    PT Start Time 1355    PT Stop Time 1445    PT Time Calculation (min) 50 min    Activity Tolerance Patient tolerated treatment well    Behavior During Therapy [facility] for tasks assessed/performed             Past Medical History:  Diagnosis Date   Asthma 01/21/2013   Dr. Sherene Sires   Endometrial cancer (HCC) 01/21/2013   Dr. Clifton James, stage I-10/14   HTN (hypertension)    Morbid obesity (HCC)    Paroxysmal A-fib (HCC)    Uterine cancer (HCC)    Past Surgical History:  Procedure Laterality Date   ABLATION     APPENDECTOMY     DILATION AND CURETTAGE OF UTERUS     NASAL SINUS SURGERY     Patient Active Problem List   Diagnosis Date Noted   Abdominal pain 01/12/2023   Contact dermatitis 01/12/2023   Diabetic ulcer of left lower leg (HCC) 12/29/2022   Type 2 diabetes mellitus (HCC) 09/11/2022   Pap smear for cervical cancer screening 05/21/2022   Cellulitis of lower leg 04/27/2022   Upper airway cough syndrome 02/16/2019   History of cardioversion 01/27/2019   Osteoarthritis 07/06/2017   BMI 50.0-59.9, adult (HCC) 09/12/2016   Gastroesophageal reflux disease without esophagitis 09/04/2016   Long term (current) use of anticoagulants 07/09/2016   Primary osteoarthritis of both knees 07/02/2016   Controlled substance agreement signed 06/06/2016   Knee pain, bilateral 02/25/2016   Major depressive disorder,  recurrent, in full remission (HCC) 02/25/2016   CKD (chronic kidney disease) stage 3, GFR 30-59 ml/min (HCC) 02/11/2016   Leg swelling 02/11/2016   History of adenomatous polyp of colon 01/26/2016   Chronic pain 10/18/2015   Asthma, intermittent, uncomplicated 12/19/2014   Hypersomnolence 08/24/2013   History of endometrial cancer 04/07/2013   Asthma, mild persistent with major component of UACS worse on ACEi  01/21/2013   Endometrial cancer (HCC) 01/21/2013   Pre-operative cardiovascular examination 01/21/2013   Paroxysmal A-fib (HCC)    Morbid (severe) obesity due to excess calories (HCC)    Essential hypertension     ONSET DATE: 12/18/22  REFERRING DIAG: W09.81,X91.4 (ICD-10-CM) - Type 2 diabetes mellitus with other specified complication, with long-term current use of insulin (HCC)  THERAPY DIAG:  Lymphedema, not elsewhere classified  Leg wound, left, sequela  Pain in left lower leg  Rationale for Evaluation and Treatment: Rehabilitation     Wound Therapy - 01/30/23 0001     Subjective Pt stated she went 2 days then had to cut the top of dressings due to pain.    Patient and Family Stated Goals wound to heal    Date of Onset 12/18/22   approproximate   Prior Treatments antibiotic, self care    Pain Scale 0-10    Pain Score 2  Pain Type Acute pain    Pain Location Leg    Pain Orientation Left    Pain Descriptors / Indicators --   itching   Pain Onset On-going    Patients Stated Pain Goal 0    Evaluation and Treatment Procedures Explained to Patient/Family Yes    Evaluation and Treatment Procedures agreed to    Wound Properties Date First Assessed: 01/15/23 Time First Assessed: 1305 Wound Type: Other (Comment) Location: Pretibial Location Orientation: Left Wound Description (Comments): superior wound Present on Admission: Yes   Wound Image Images linked: 1    Dressing Type Gauze (Comment);Compression wrap    Dressing Changed Changed    Dressing Status New  drainage    Dressing Change Frequency PRN    Site / Wound Assessment Granulation tissue;Other (Comment)    % Wound base Red or Granulating 95%    % Wound base Yellow/Fibrinous Exudate 5%    Peri-wound Assessment Edema;Erythema (non-blanchable)    Closure None    Drainage Amount Scant    Treatment Cleansed    Wound Properties Date First Assessed: 01/15/23 Time First Assessed: 1320 Wound Type: Other (Comment) Location: Pretibial Location Orientation: Left Wound Description (Comments): Distal and larger wound Present on Admission: Yes   Dressing Type Gauze (Comment);Compression wrap   vaseline/lotion, medihoney alginate, profore with 1/2 foam and netting #8   Dressing Changed Changed    Dressing Status Old drainage    Dressing Change Frequency PRN    Site / Wound Assessment Granulation tissue;Other (Comment)    % Wound base Red or Granulating 90%    % Wound base Yellow/Fibrinous Exudate 10%    Peri-wound Assessment Edema;Erythema (non-blanchable)    Wound Length (cm) 1 cm    Wound Width (cm) 2.3 cm    Wound Depth (cm) 0.2 cm    Wound Volume (cm^3) 0.46 cm^3    Wound Surface Area (cm^2) 2.3 cm^2    Margins Epibole (rolled edges)    Drainage Amount Minimal    Drainage Description Serous    Treatment Cleansed;Debridement (Selective)    Selective Debridement (non-excisional) - Location wound bed    Selective Debridement (non-excisional) - Tools Used Forceps    Selective Debridement (non-excisional) - Tissue Removed biofilm    Wound Therapy - Clinical Statement see below    Wound Therapy - Functional Problem List difficulty walking due to OA, difficulty dressing due to edema    Factors Delaying/Impairing Wound Healing Altered sensation;Diabetes Mellitus;Immobility;Multiple medical problems;Polypharmacy;Vascular compromise    Hydrotherapy Plan Debridement;Dressing change;Patient/family education;Other (comment)    Wound Therapy - Frequency 2X / week    Wound Therapy - Current  Recommendations PT    Wound Plan debridement, manual, dressing change    Dressing  medihoney hydrofiber, 4x4, ABD and profore compression bandage system. #5 netting    Modality Educated therex added to HEP:  diagraphamatic breathing, ankle pump, LAQ, hip Abd, marchings 10x each               PATIENT EDUCATION: Education details: Keep compression bandage dry, bandage should not be painful if it is take one layer off at a time.   Person educated: Patient and Spouse Education method: Explanation and Verbal cues Education comprehension: verbalized understanding   HOME EXERCISE PROGRAM: Ankle pumps    GOALS: Goals reviewed with patient? No  SHORT TERM GOALS: Target date: 02/05/23  PT wound to be 100% granulated Baseline: Goal status: INITIAL  2.  Wounds to have decreased size by 50% Baseline:  Goal status:  INITIAL  3.  PT to have no pain in her Lt LE  Baseline:  Goal status: INITIAL    LONG TERM GOALS: Target date: 02/26/23  Wounds to be healed to prevent cellulitis  Baseline:  Goal status: INITIAL  2.  PT to be wearing compression to keep edema under control  Baseline:  Goal status: INITIAL     ASSESSMENT:  CLINICAL IMPRESSION: Pt returned today with dressings on but has cut upper part of dressings due to pain with bottle neck apparent.  Pt educated to remove outer layer vs cut dressings, verbalized understanding.  Reviewed lymphedema care and importance of wearing compression regularly for edema control to prevent further wound.  PT stated she has worn compression socks in the past and unable to tolerate.  Superior wound presents almost fully healed.  Measurements taken with inferior wound and some reduction in width.  Selective debridement to address slough in wound bed and epibole edges to promote healing.  Added foam for edema control as trial for increased comfort for compliance with  bandages.  Reports of comfort at EOS.  Pt educated on benefits of  completing exercises at home daily for strengthening and circulation, given HEP handout and verbalized understanding.     OBJECTIVE IMPAIRMENTS: decreased activity tolerance, decreased mobility, difficulty walking, increased edema, impaired sensation, obesity, and pain.   ACTIVITY LIMITATIONS: carrying, lifting, bathing, dressing, hygiene/grooming, and locomotion level  PERSONAL FACTORS: 1-2 comorbidities: diabetes, obesity  are also affecting patient's functional outcome.   REHAB POTENTIAL: Good  CLINICAL DECISION MAKING: Stable/uncomplicated  EVALUATION COMPLEXITY: Low  PLAN: PT FREQUENCY: 2x/week  PT DURATION: 6 weeks  PLANNED INTERVENTIONS: 97110-Therapeutic exercises, 97535- Self Care, 40981- Manual therapy, Patient/Family education, and wound care   PLAN FOR NEXT SESSION: ontinue with wound care. Discuss cut to fit/juxtafit as possible compression options following wound care for edema control, educate may be more expensive.    Becky Sax, LPTA/CLT; CBIS [phone]  Alexis [name], PTA 01/30/2023, 4:17 PM   01/30/2023, 4:17 PM

## 2023-02-03 ENCOUNTER — Ambulatory Visit (HOSPITAL_COMMUNITY): Payer: Medicare HMO | Admitting: Physical Therapy

## 2023-02-04 ENCOUNTER — Ambulatory Visit (INDEPENDENT_AMBULATORY_CARE_PROVIDER_SITE_OTHER): Payer: Medicare HMO | Admitting: Podiatry

## 2023-02-04 DIAGNOSIS — Z91199 Patient's noncompliance with other medical treatment and regimen due to unspecified reason: Secondary | ICD-10-CM

## 2023-02-04 NOTE — Progress Notes (Signed)
No show

## 2023-02-05 ENCOUNTER — Encounter (HOSPITAL_COMMUNITY): Payer: Self-pay

## 2023-02-05 ENCOUNTER — Ambulatory Visit (HOSPITAL_COMMUNITY): Payer: Medicare HMO

## 2023-02-05 DIAGNOSIS — I890 Lymphedema, not elsewhere classified: Secondary | ICD-10-CM

## 2023-02-05 DIAGNOSIS — S81802S Unspecified open wound, left lower leg, sequela: Secondary | ICD-10-CM | POA: Diagnosis not present

## 2023-02-05 DIAGNOSIS — T8789 Other complications of amputation stump: Secondary | ICD-10-CM | POA: Diagnosis not present

## 2023-02-05 DIAGNOSIS — M79662 Pain in left lower leg: Secondary | ICD-10-CM | POA: Diagnosis not present

## 2023-02-05 NOTE — Therapy (Signed)
OUTPATIENT PHYSICAL THERAPY wound TREATMENT Patient Name: Alexis [name] MRN: [id number] DOB:11/02/1955, 67 y.o., female Today's Date: 02/05/2023   PCP: Rica Records, FNP REFERRING PROVIDER: Rica Records, FNP  END OF SESSION:  PT End of Session - 02/05/23 1713     Visit Number 4    Number of Visits 12    Date for PT Re-Evaluation 02/26/23    Authorization Type [employer]    Authorization Time Period approved 12 visits 10/10-->02/26/23    Authorization - Visit Number 4    Authorization - Number of Visits 12    Progress Note Due on Visit 10    PT Start Time 1351    PT Stop Time 1438    PT Time Calculation (min) 47 min    Activity Tolerance Patient tolerated treatment well    Behavior During Therapy [facility] for tasks assessed/performed             Past Medical History:  Diagnosis Date   Asthma 01/21/2013   Dr. Sherene Sires   Endometrial cancer (HCC) 01/21/2013   Dr. Clifton James, stage I-10/14   HTN (hypertension)    Morbid obesity (HCC)    Paroxysmal A-fib (HCC)    Uterine cancer (HCC)    Past Surgical History:  Procedure Laterality Date   ABLATION     APPENDECTOMY     DILATION AND CURETTAGE OF UTERUS     NASAL SINUS SURGERY     Patient Active Problem List   Diagnosis Date Noted   Abdominal pain 01/12/2023   Contact dermatitis 01/12/2023   Diabetic ulcer of left lower leg (HCC) 12/29/2022   Type 2 diabetes mellitus (HCC) 09/11/2022   Pap smear for cervical cancer screening 05/21/2022   Cellulitis of lower leg 04/27/2022   Upper airway cough syndrome 02/16/2019   History of cardioversion 01/27/2019   Osteoarthritis 07/06/2017   BMI 50.0-59.9, adult (HCC) 09/12/2016   Gastroesophageal reflux disease without esophagitis 09/04/2016   Long term (current) use of anticoagulants 07/09/2016   Primary osteoarthritis of both knees 07/02/2016   Controlled substance agreement signed 06/06/2016   Knee pain, bilateral 02/25/2016   Major depressive disorder,  recurrent, in full remission (HCC) 02/25/2016   CKD (chronic kidney disease) stage 3, GFR 30-59 ml/min (HCC) 02/11/2016   Leg swelling 02/11/2016   History of adenomatous polyp of colon 01/26/2016   Chronic pain 10/18/2015   Asthma, intermittent, uncomplicated 12/19/2014   Hypersomnolence 08/24/2013   History of endometrial cancer 04/07/2013   Asthma, mild persistent with major component of UACS worse on ACEi  01/21/2013   Endometrial cancer (HCC) 01/21/2013   Pre-operative cardiovascular examination 01/21/2013   Paroxysmal A-fib (HCC)    Morbid (severe) obesity due to excess calories (HCC)    Essential hypertension     ONSET DATE: 12/18/22  REFERRING DIAG: H47.42,V95.6 (ICD-10-CM) - Type 2 diabetes mellitus with other specified complication, with long-term current use of insulin (HCC)  THERAPY DIAG:  Lymphedema, not elsewhere classified  Leg wound, left, sequela  Pain in left lower leg  Rationale for Evaluation and Treatment: Rehabilitation     Wound Therapy - 02/05/23 0001     Subjective Pt arrived with dressings intact, reports of increased comfort with current dressings    Patient and Family Stated Goals wound to heal    Date of Onset 12/18/22   approxpriate   Prior Treatments antibiotic, self care    Pain Scale 0-10    Pain Score 0-No pain    Evaluation and Treatment  Procedures Explained to Patient/Family Yes    Evaluation and Treatment Procedures agreed to    Wound Properties Date First Assessed: 01/15/23 Time First Assessed: 1305 Wound Type: Other (Comment) Location: Pretibial Location Orientation: Left Wound Description (Comments): superior wound Present on Admission: Yes   Dressing Type Gauze (Comment);Compression wrap    Dressing Changed Changed    Dressing Status New drainage    Dressing Change Frequency PRN    Site / Wound Assessment Granulation tissue;Other (Comment)    % Wound base Red or Granulating 100%    % Wound base Yellow/Fibrinous Exudate 0%     Peri-wound Assessment Edema;Erythema (non-blanchable)    Wound Length (cm) 0 cm    Wound Width (cm) 0 cm    Wound Depth (cm) 0 cm    Wound Volume (cm^3) 0 cm^3    Wound Surface Area (cm^2) 0 cm^2    Closure None    Treatment Cleansed    Wound Properties Date First Assessed: 01/15/23 Time First Assessed: 1320 Wound Type: Other (Comment) Location: Pretibial Location Orientation: Left Wound Description (Comments): Distal and larger wound Present on Admission: Yes   Wound Image Images linked: 1    Dressing Type Impregnated gauze (bismuth);Gauze (Comment);Compression wrap   lotion/vaseline, xeroform, 2x2, profore with 1/2in foam   Dressing Changed Changed    Dressing Status Old drainage    Dressing Change Frequency PRN    Site / Wound Assessment Granulation tissue;Other (Comment)    % Wound base Red or Granulating 95%    % Wound base Yellow/Fibrinous Exudate 5%    Peri-wound Assessment Edema;Erythema (non-blanchable)    Wound Length (cm) 0.3 cm    Wound Width (cm) 1.2 cm    Wound Surface Area (cm^2) 0.36 cm^2    Drainage Amount Minimal    Drainage Description Serous    Treatment Cleansed;Debridement (Selective)    Selective Debridement (non-excisional) - Location wound bed    Selective Debridement (non-excisional) - Tools Used Forceps    Selective Debridement (non-excisional) - Tissue Removed biofilm    Wound Therapy - Clinical Statement see below    Wound Therapy - Functional Problem List difficulty walking due to OA, difficulty dressing due to edema    Factors Delaying/Impairing Wound Healing Altered sensation;Diabetes Mellitus;Immobility;Multiple medical problems;Polypharmacy;Vascular compromise    Hydrotherapy Plan Debridement;Dressing change;Patient/family education;Other (comment)    Wound Therapy - Frequency 2X / week    Wound Therapy - Current Recommendations PT    Wound Plan debridement, manual, dressing change    Dressing  lotion/vaseline, xeroform, 2x2, profore with 1/2in foam     Manual Therapy decongestive lymph technqiues    Modality Educated self manual decongestive technqiues 1-8               PATIENT EDUCATION: Education details: Keep compression bandage dry, bandage should not be painful if it is take one layer off at a time.   Person educated: Patient and Spouse Education method: Explanation and Verbal cues Education comprehension: verbalized understanding   HOME EXERCISE PROGRAM: Ankle pumps    GOALS: Goals reviewed with patient? No  SHORT TERM GOALS: Target date: 02/05/23  PT wound to be 100% granulated Baseline: Goal status: INITIAL  2.  Wounds to have decreased size by 50% Baseline:  Goal status: INITIAL  3.  PT to have no pain in her Lt LE  Baseline:  Goal status: INITIAL    LONG TERM GOALS: Target date: 02/26/23  Wounds to be healed to prevent cellulitis  Baseline:  Goal status:  INITIAL  2.  PT to be wearing compression to keep edema under control  Baseline:  Goal status: INITIAL     ASSESSMENT:  CLINICAL IMPRESSION: Pt returns today with reports of improved tolerance with dressings following additional foam.   Upon removal of dressings noted bottle necking, pt admits to pushing dressings down and scratching.  Pt educated not to scratch for skin integrity and encouraged not to push down dressings for edema control with verbalized understanding.  Superior wound is fully healed and distal wound is approximating nicely.  Photo in media.  Manual decongestive techniques complete for edema control.  Pt educated on self manual and given handout.  Changed dressings to xeroform and continues with profore with 1/2in foam.  Pt reports she has extreme difficulty donning previous compression garments.  Will benefit from educated on donning with butler and ETI measurements when edema has reduced.     OBJECTIVE IMPAIRMENTS: decreased activity tolerance, decreased mobility, difficulty walking, increased edema, impaired sensation,  obesity, and pain.   ACTIVITY LIMITATIONS: carrying, lifting, bathing, dressing, hygiene/grooming, and locomotion level  PERSONAL FACTORS: 1-2 comorbidities: diabetes, obesity  are also affecting patient's functional outcome.   REHAB POTENTIAL: Good  CLINICAL DECISION MAKING: Stable/uncomplicated  EVALUATION COMPLEXITY: Low  PLAN: PT FREQUENCY: 2x/week  PT DURATION: 6 weeks  PLANNED INTERVENTIONS: 97110-Therapeutic exercises, 97535- Self Care, 40981- Manual therapy, Patient/Family education, and wound care   PLAN FOR NEXT SESSION: Continue with wound care. Discuss cut to fit/juxtafit as possible compression options following wound care for edema control, educate may be more expensive.  Educate donning with compression garments.  Becky Sax, LPTA/CLT; CBIS [phone]  Juel Burrow, PTA 02/05/2023, 5:14 PM   02/05/2023, 5:14 PM

## 2023-02-06 ENCOUNTER — Ambulatory Visit: Payer: Medicare HMO | Admitting: Physician Assistant

## 2023-02-09 ENCOUNTER — Telehealth: Payer: Self-pay

## 2023-02-09 ENCOUNTER — Ambulatory Visit: Payer: Medicare HMO

## 2023-02-09 NOTE — Telephone Encounter (Signed)
**Note De-Identified Gloris Shiroma Obfuscation** Per the [employer] provider portal: Approved: Authorization #[id number]  Tracking #[id number] Dates of service 02/23/2023 - 04/24/2023 Split-Night Polysomnography Code Status Description 95811 Approved Polysomnography; age 66 years or older, sleep staging with 4 or more additional parameters of sleep, with initiation of continuous positive airway pressure therapy or bilevel ventilation, attended by a technologist

## 2023-02-10 ENCOUNTER — Ambulatory Visit (HOSPITAL_COMMUNITY): Payer: Medicare HMO | Attending: Family Medicine

## 2023-02-10 ENCOUNTER — Encounter (HOSPITAL_COMMUNITY): Payer: Self-pay

## 2023-02-10 DIAGNOSIS — S81802S Unspecified open wound, left lower leg, sequela: Secondary | ICD-10-CM | POA: Insufficient documentation

## 2023-02-10 DIAGNOSIS — I890 Lymphedema, not elsewhere classified: Secondary | ICD-10-CM | POA: Diagnosis not present

## 2023-02-10 DIAGNOSIS — M79662 Pain in left lower leg: Secondary | ICD-10-CM | POA: Diagnosis not present

## 2023-02-10 NOTE — Therapy (Signed)
OUTPATIENT PHYSICAL THERAPY wound TREATMENT Patient Name: Alexis [name] MRN: [id number] DOB:04/29/1955, 67 y.o., female Today's Date: 02/10/2023   PCP: Rica Records, FNP REFERRING PROVIDER: Rica Records, FNP  END OF SESSION:  PT End of Session - 02/10/23 1531     Visit Number 5    Number of Visits 12    Date for PT Re-Evaluation 02/26/23    Authorization Type [employer]    Authorization Time Period approved 12 visits 10/10-->02/26/23    Authorization - Number of Visits 12    Progress Note Due on Visit 10    PT Start Time 1440    PT Stop Time 1520    PT Time Calculation (min) 40 min    Activity Tolerance Patient tolerated treatment well    Behavior During Therapy [facility] for tasks assessed/performed             Past Medical History:  Diagnosis Date   Asthma 01/21/2013   Dr. Sherene Sires   Endometrial cancer (HCC) 01/21/2013   Dr. Clifton James, stage I-10/14   HTN (hypertension)    Morbid obesity (HCC)    Paroxysmal A-fib (HCC)    Uterine cancer (HCC)    Past Surgical History:  Procedure Laterality Date   ABLATION     APPENDECTOMY     DILATION AND CURETTAGE OF UTERUS     NASAL SINUS SURGERY     Patient Active Problem List   Diagnosis Date Noted   Abdominal pain 01/12/2023   Contact dermatitis 01/12/2023   Diabetic ulcer of left lower leg (HCC) 12/29/2022   Type 2 diabetes mellitus (HCC) 09/11/2022   Pap smear for cervical cancer screening 05/21/2022   Cellulitis of lower leg 04/27/2022   Upper airway cough syndrome 02/16/2019   History of cardioversion 01/27/2019   Osteoarthritis 07/06/2017   BMI 50.0-59.9, adult (HCC) 09/12/2016   Gastroesophageal reflux disease without esophagitis 09/04/2016   Long term (current) use of anticoagulants 07/09/2016   Primary osteoarthritis of both knees 07/02/2016   Controlled substance agreement signed 06/06/2016   Knee pain, bilateral 02/25/2016   Major depressive disorder, recurrent, in full remission (HCC)  02/25/2016   CKD (chronic kidney disease) stage 3, GFR 30-59 ml/min (HCC) 02/11/2016   Leg swelling 02/11/2016   History of adenomatous polyp of colon 01/26/2016   Chronic pain 10/18/2015   Asthma, intermittent, uncomplicated 12/19/2014   Hypersomnolence 08/24/2013   History of endometrial cancer 04/07/2013   Asthma, mild persistent with major component of UACS worse on ACEi  01/21/2013   Endometrial cancer (HCC) 01/21/2013   Pre-operative cardiovascular examination 01/21/2013   Paroxysmal A-fib (HCC)    Morbid (severe) obesity due to excess calories (HCC)    Essential hypertension     ONSET DATE: 12/18/22  REFERRING DIAG: N82.95,A21.3 (ICD-10-CM) - Type 2 diabetes mellitus with other specified complication, with long-term current use of insulin (HCC)  THERAPY DIAG:  Lymphedema, not elsewhere classified  Leg wound, left, sequela  Pain in left lower leg  Rationale for Evaluation and Treatment: Rehabilitation     Wound Therapy - 02/10/23 0001     Subjective Pt arrived with dressings intact, no reports of pain today.    Patient and Family Stated Goals wound to heal    Date of Onset 12/18/22    Prior Treatments antibiotic, self care    Pain Scale 0-10    Pain Score 0-No pain    Evaluation and Treatment Procedures Explained to Patient/Family Yes    Evaluation and Treatment Procedures  agreed to    Wound Properties Date First Assessed: 01/15/23 Time First Assessed: 1305 Wound Type: Other (Comment) Location: Pretibial Location Orientation: Left Wound Description (Comments): superior wound Present on Admission: Yes   Wound Image Images linked: 1    Dressing Type Compression wrap   lotion, vaseline, profore with 1/2in foam, netting   Dressing Changed Changed    Dressing Status None;Clean    Dressing Change Frequency PRN    Site / Wound Assessment Granulation tissue    % Wound base Red or Granulating 100%    % Wound base Yellow/Fibrinous Exudate 0%    Peri-wound Assessment  Edema;Erythema (non-blanchable)    Wound Length (cm) 0 cm    Wound Width (cm) 0 cm    Wound Depth (cm) 0 cm    Wound Volume (cm^3) 0 cm^3    Wound Surface Area (cm^2) 0 cm^2    Closure None    Treatment Cleansed    Wound Properties Date First Assessed: 01/15/23 Time First Assessed: 1320 Wound Type: Other (Comment) Location: Pretibial Location Orientation: Left Wound Description (Comments): Distal and larger wound Present on Admission: Yes   Dressing Type Compression wrap   vaseline, lotion, profore with 1/2in foam, netting   Dressing Changed Changed    Dressing Status Clean, Dry, Intact    Dressing Change Frequency PRN    Site / Wound Assessment Granulation tissue    % Wound base Red or Granulating 100%    % Wound base Yellow/Fibrinous Exudate 0%    Peri-wound Assessment Edema;Erythema (non-blanchable)    Wound Length (cm) 0 cm    Wound Width (cm) 0 cm    Wound Depth (cm) 0 cm    Wound Volume (cm^3) 0 cm^3    Wound Surface Area (cm^2) 0 cm^2    Drainage Amount None    Treatment Cleansed    Wound Therapy - Clinical Statement see below    Wound Therapy - Functional Problem List difficulty walking due to OA, difficulty dressing due to edema    Factors Delaying/Impairing Wound Healing Altered sensation;Diabetes Mellitus;Immobility;Multiple medical problems;Polypharmacy;Vascular compromise    Hydrotherapy Plan Debridement;Dressing change;Patient/family education;Other (comment)    Wound Therapy - Frequency 2X / week    Wound Therapy - Current Recommendations PT    Wound Plan Educated donning/doffing new compression garments prior DC to HEP    Dressing  lotion, vaseline, profore wiht 1/2in foam and netting    Modality ETI measurements    Modality Educated donning with butler               PATIENT EDUCATION: Education details: Keep compression bandage dry, bandage should not be painful if it is take one layer off at a time.   Person educated: Patient and Spouse Education  method: Explanation and Verbal cues Education comprehension: verbalized understanding   HOME EXERCISE PROGRAM: Ankle pumps    GOALS: Goals reviewed with patient? No  SHORT TERM GOALS: Target date: 02/05/23  PT wound to be 100% granulated Baseline: Goal status: MET  2.  Wounds to have decreased size by 50% Baseline:  Goal status: MET  3.  PT to have no pain in her Lt LE  Baseline:  Goal status: MET    LONG TERM GOALS: Target date: 02/26/23  Wounds to be healed to prevent cellulitis  Baseline:  Goal status: MET  2.  PT to be wearing compression to keep edema under control  Baseline:  Goal status: MET     ASSESSMENT:  CLINICAL IMPRESSION: Upon  removal of dressings wounds fully healed.  Pt educated importance of compression garments, measured for knee/thigh high compression garments and handout given for ETI.  Demonstrated donning with butler.  Encouraged to purchase immediately.  Reduced frequency to 1x/week and will continue with profore until pt has garments and able to donn I to reduce risk of cellulitis.   OBJECTIVE IMPAIRMENTS: decreased activity tolerance, decreased mobility, difficulty walking, increased edema, impaired sensation, obesity, and pain.   ACTIVITY LIMITATIONS: carrying, lifting, bathing, dressing, hygiene/grooming, and locomotion level  PERSONAL FACTORS: 1-2 comorbidities: diabetes, obesity  are also affecting patient's functional outcome.   REHAB POTENTIAL: Good  CLINICAL DECISION MAKING: Stable/uncomplicated  EVALUATION COMPLEXITY: Low  PLAN: PT FREQUENCY: 2x/week  PT DURATION: 6 weeks  PLANNED INTERVENTIONS: 97110-Therapeutic exercises, 97535- Self Care, 19147- Manual therapy, Patient/Family education, and wound care   PLAN FOR NEXT SESSION: F/U with decreased frequency for tolerance with profore.  Answer question regarding compression garments and donning.    Becky Sax, LPTA/CLT; CBIS [phone] Virgina Organ, PT  CLT [phone]  02/10/2023, 4:36 PM   02/10/2023, 4:36 PM

## 2023-02-11 ENCOUNTER — Ambulatory Visit: Payer: Medicare HMO | Attending: Cardiology | Admitting: *Deleted

## 2023-02-11 DIAGNOSIS — I480 Paroxysmal atrial fibrillation: Secondary | ICD-10-CM

## 2023-02-11 DIAGNOSIS — Z5181 Encounter for therapeutic drug level monitoring: Secondary | ICD-10-CM | POA: Diagnosis not present

## 2023-02-11 LAB — POCT INR: INR: 2.4 (ref 2.0–3.0)

## 2023-02-11 NOTE — Patient Instructions (Signed)
Continue warfarin 1 tablet daily except 1 1/2 tablets on Tuesdays, Thursdays and Saturdays Recheck INR in 3 weeks

## 2023-02-13 ENCOUNTER — Ambulatory Visit: Payer: Medicare HMO | Attending: Physician Assistant | Admitting: Physician Assistant

## 2023-02-13 ENCOUNTER — Ambulatory Visit (HOSPITAL_COMMUNITY): Payer: Medicare HMO | Admitting: Physical Therapy

## 2023-02-13 NOTE — Progress Notes (Deleted)
Office Visit    Patient Name: Alexis [name] Date of Encounter: 02/13/2023  PCP:  Rica Records, FNP   [facility]  Cardiologist:  Donato Schultz, MD  Advanced Practice Provider:  No care team member to display Electrophysiologist:  Lanier Prude, MD   HPI    Alexis [name] is a 67 y.o. female with a past medical history of paroxysmal atrial fibrillation, endometrial cancer, uterine cancer, and hypertension presents today for follow-up visit.  She was seen September 2023 by Dr. Anne Fu.  She continued to have intermittent episodes of atrial fibrillation at that time.  Occasionally noticed her heart rate as high as 110 112 bpm.  Prefers to stay away from alternative treatments like ablation and pacemakers.  She has had at least 15 prior cardioversions.  States that there was a time where her chest would be throbbing because of her sleeping position, this also triggered her atrial fibrillation.  This has not occurred in over a year.  In 2022 she had bariatric surgery and successfully lost 100 pounds.  Recently, had regained about 12 pounds which she mostly attributed to stress and recent loss of her mother.  After that, she moved back from [place] and bought a home with her husband.  Of note just after her surgery she immediately went back in atrial fibrillation and was admitted for observation.  Started on digoxin at that time.  She was on Eliquis and digoxin.  Denies any bleeding issues.  Switched from Eliquis to Xarelto.  No chest pain, shortness of breath, peripheral edema.  No lightheadedness, headaches, syncope, orthopnea, or PND.  She was seen by me 11/13/22, she tells me that her digoxin was cut in half last visit because her dig level was too high.  She has not been taking this consistently.  She is also out of several of her medications.  She states her atrial fibrillation rate is high as 150s or 160s.  She does tire easily.  She has been trying to  increase her activity level but the atrial fibrillation makes it hard.  We discussed a management strategy which includes rate control, another DCCV, ablation.  Patient has had 15-20 DCCV's without success.  Is not interested in any invasive procedures such as ablation.  We cannot titrate her medications today due to her blood pressure being low.  We did order a digoxin level.  We also discussed the Watchman procedure since she is having difficulty affording Eliquis.  We have switched her to Coumadin today.  We also discussed inspire and the patient is interested in this procedure.  However, she is not a candidate due to her BMI.  BMI would need to be 40.  Patient is aware that she would need to lose around 60 to 65 pounds to be a candidate for this.  Today, she ***    Past Medical History    Past Medical History:  Diagnosis Date   Asthma 01/21/2013   Dr. Sherene Sires   Endometrial cancer [facility]) 01/21/2013   Dr. Clifton James, stage I-10/14   HTN (hypertension)    Morbid obesity (HCC)    Paroxysmal A-fib (HCC)    Uterine cancer (HCC)    Past Surgical History:  Procedure Laterality Date   ABLATION     APPENDECTOMY     DILATION AND CURETTAGE OF UTERUS     NASAL SINUS SURGERY      Allergies  Allergies  Allergen Reactions   Ace Inhibitors  Cough / "congestion"   Ciprofloxacin    Cocoa    Flavoring Agent     Makes mouth numb   Prednisone Other (See Comments)    Raises her blood pressure   Sulfa Antibiotics Nausea And Vomiting    Other reaction(s): Nausea And Vomiting   Sulfamethoxazole Nausea Only     EKGs/Labs/Other Studies Reviewed:   The following studies were reviewed today: Cardiac Studies & Procedures       ECHOCARDIOGRAM  ECHOCARDIOGRAM COMPLETE 12/17/2021  Narrative ECHOCARDIOGRAM REPORT    Patient Name:   Alexis [name] Date of Exam: 12/17/2021 Medical Rec #:  [id number]     Height:       64.0 in Accession #:    [id number]    Weight:       277.6 lb Date of Birth:   1955/12/26     BSA:          2.249 m Patient Age:    66 years      BP:           144/90 mmHg Patient Gender: F             HR:           87 bpm. [facility]  Procedure: 2D Echo, Cardiac Doppler and Color Doppler  Indications:    Atrial fibrillation  History:        Patient has prior history of Echocardiogram examinations, most recent 02/11/2017. Arrythmias:Atrial Fibrillation; Risk Factors:Hypertension.  Sonographer:    Mikki Harbor Referring Phys: [phone] DONNA C CARROLL   Sonographer Comments: Patient is obese. IMPRESSIONS   1. Left ventricular ejection fraction, by estimation, is 55 to 60%. The left ventricle has normal function. The left ventricle has no regional wall motion abnormalities. Left ventricular diastolic parameters are indeterminate. 2. Right ventricular systolic function is low normal. The right ventricular size is normal. There is normal pulmonary artery systolic pressure. The estimated right ventricular systolic pressure is 27.4 mmHg. 3. The mitral valve is grossly normal. Mild mitral valve regurgitation. 4. The aortic valve is tricuspid. Aortic valve regurgitation is not visualized. Aortic valve sclerosis is present, with no evidence of aortic valve stenosis. Aortic valve mean gradient measures 3.0 mmHg. 5. The inferior vena cava is dilated in size with <50% respiratory variability, suggesting right atrial pressure of 15 mmHg.  Comparison(s): Prior images unable to be directly viewed.  FINDINGS Left Ventricle: Left ventricular ejection fraction, by estimation, is 55 to 60%. The left ventricle has normal function. The left ventricle has no regional wall motion abnormalities. The left ventricular internal cavity size was normal in size. There is borderline left ventricular hypertrophy. Left ventricular diastolic function could not be evaluated due to atrial fibrillation. Left ventricular diastolic parameters are indeterminate.  Right Ventricle: The  right ventricular size is normal. No increase in right ventricular wall thickness. Right ventricular systolic function is low normal. There is normal pulmonary artery systolic pressure. The tricuspid regurgitant velocity is 1.76 m/s, and with an assumed right atrial pressure of 15 mmHg, the estimated right ventricular systolic pressure is 27.4 mmHg.  Left Atrium: Left atrial size was normal in size.  Right Atrium: Right atrial size was normal in size.  Pericardium: There is no evidence of pericardial effusion. Presence of epicardial fat layer.  Mitral Valve: The mitral valve is grossly normal. Mild mitral valve regurgitation. MV peak gradient, 6.9 mmHg. The mean mitral valve gradient is 2.0 mmHg.  Tricuspid Valve: The tricuspid valve is  grossly normal. Tricuspid valve regurgitation is trivial.  Aortic Valve: The aortic valve is tricuspid. There is mild aortic valve annular calcification. Aortic valve regurgitation is not visualized. Aortic valve sclerosis is present, with no evidence of aortic valve stenosis. Aortic valve mean gradient measures 3.0 mmHg. Aortic valve peak gradient measures 6.1 mmHg. Aortic valve area, by VTI measures 2.75 cm.  Pulmonic Valve: The pulmonic valve was not well visualized. Pulmonic valve regurgitation is trivial.  Aorta: The aortic root is normal in size and structure.  Venous: The inferior vena cava is dilated in size with less than 50% respiratory variability, suggesting right atrial pressure of 15 mmHg.  IAS/Shunts: No atrial level shunt detected by color flow Doppler.   LEFT VENTRICLE PLAX 2D LVIDd:         4.60 cm   Diastology LVIDs:         3.10 cm   LV e' medial:    9.68 cm/s LV PW:         1.10 cm   LV E/e' medial:  12.5 LV IVS:        1.00 cm   LV e' lateral:   12.50 cm/s LVOT diam:     2.00 cm   LV E/e' lateral: 9.7 LV SV:         74 LV SV Index:   33 LVOT Area:     3.14 cm   RIGHT VENTRICLE RV Basal diam:  2.70 cm RV Mid diam:    2.30  cm RV S prime:     11.30 cm/s  LEFT ATRIUM             Index        RIGHT ATRIUM           Index LA diam:        4.60 cm 2.05 cm/m   RA Area:     20.00 cm LA Vol (A2C):   66.1 ml 29.39 ml/m  RA Volume:   54.20 ml  24.10 ml/m LA Vol (A4C):   58.5 ml 26.01 ml/m LA Biplane Vol: 65.5 ml 29.13 ml/m AORTIC VALVE                    PULMONIC VALVE AV Area (Vmax):    2.89 cm     PV Vmax:       0.94 m/s AV Area (Vmean):   2.67 cm     PV Peak grad:  3.5 mmHg AV Area (VTI):     2.75 cm AV Vmax:           123.00 cm/s AV Vmean:          79.300 cm/s AV VTI:            0.270 m AV Peak Grad:      6.1 mmHg AV Mean Grad:      3.0 mmHg LVOT Vmax:         113.00 cm/s LVOT Vmean:        67.500 cm/s LVOT VTI:          0.236 m LVOT/AV VTI ratio: 0.87  AORTA Ao Root diam: 3.70 cm  MITRAL VALVE                TRICUSPID VALVE MV Area (PHT): 5.13 cm     TR Peak grad:   12.4 mmHg MV Area VTI:   2.61 cm     TR Vmax:        176.00  cm/s MV Peak grad:  6.9 mmHg MV Mean grad:  2.0 mmHg     SHUNTS MV Vmax:       1.31 m/s     Systemic VTI:  0.24 m MV Vmean:      65.4 cm/s    Systemic Diam: 2.00 cm MV Decel Time: 148 msec MV E velocity: 121.00 cm/s  Nona Dell MD Electronically signed by Nona Dell MD Signature Date/Time: 12/17/2021/4:29:15 PM    Final    MONITORS  CARDIAC EVENT MONITOR 02/11/2017  Narrative  No atrial fibrillation detected  Occasional sinus bradycardia noted, ranging from 45-59 bpm.  Symptoms of fatigue were associated with both sinus rhythm as well as sinus bradycardia.  There were no significant pauses.  Overall reassuring monitor.  Rhythm does not explain fatigue. Donato Schultz, MD            EKG:  EKG is  ordered today.  The ekg ordered today demonstrates atrial flutter relation with RVR, rate 129 bpm  Recent Labs: 05/05/2022: TSH 1.780 10/06/2022: ALT 13; BUN 13; Creatinine, Ser 0.75; Hemoglobin 15.0; Platelets 262; Potassium 4.6; Sodium 141  Recent  Lipid Panel    Component Value Date/Time   CHOL 120 10/06/2022 1016   TRIG 121 10/06/2022 1016   HDL 44 10/06/2022 1016   CHOLHDL 2.7 10/06/2022 1016   LDLCALC 54 10/06/2022 1016    Risk Assessment/Calculations:   CHA2DS2-VASc Score = 4   This indicates a 4.8% annual risk of stroke. The patient's score is based upon: CHF History: 0 HTN History: 1 Diabetes History: 1 Stroke History: 0 Vascular Disease History: 0 Age Score: 1 Gender Score: 1   {This patient has a significant risk of stroke if diagnosed with atrial fibrillation.  Please consider VKA or DOAC agent for anticoagulation if the bleeding risk is acceptable.   You can also use the SmartPhrase .[email or web address] for documentation.   :[id number]}  Home Medications   No outpatient medications have been marked as taking for the 02/13/23 encounter (Appointment) with Sharlene Dory, PA-C.     Review of Systems      All other systems reviewed and are otherwise negative except as noted above.  Physical Exam    VS:  There were no vitals taken for this visit. , BMI There is no height or weight on file to calculate BMI.  Wt Readings from Last 3 Encounters:  01/12/23 (!) 308 lb (139.7 kg)  12/29/22 (!) 307 lb (139.3 kg)  09/11/22 294 lb (133.4 kg)     GEN: Well nourished, well developed, in no acute distress. HEENT: normal. Neck: Supple, no JVD, carotid bruits, or masses. Cardiac: Irregularly irregular, no murmurs, rubs, or gallops. No clubbing, cyanosis, 1+ non pitting edema.  Radials/PT 2+ and equal bilaterally.  Respiratory:  Respirations regular and unlabored, clear to auscultation bilaterally. GI: Soft, nontender, nondistended. MS: No deformity or atrophy. Skin: Warm and dry, no rash. Neuro:  Strength and sensation are intact. Psych: Normal affect.  Assessment & Plan    Paroxysmal atrial fibrillation -Atrial fibrillation with RVR today, rate 129 bpm -Obtain digoxin level and increase if indicated -Continue  diltiazem and metoprolol as well as Eliquis -Once she runs out of her Eliquis she is planning to switch to Coumadin, discussed continuing Eliquis for 3 days while starting Coumadin and then discontinuing after 3 days. -She was started on Coumadin 5 mg daily -She will need close follow-up, not interested in another DCCV or invasive procedures -goal is rate control -needs  coumadin clinic appointment but lives in [place], discussed needs an INR in 5-6 days after starting coumadin -will order split night sleep study for excessive daytime sleepiness  Mitral valve insufficiency, unspecified etiology -Recent echocardiogram September 2023 showed only a mild leak of her mitral valve  HTN -Blood pressure well-controlled today 108/64 -continue current medications  No BP recorded.  {Refresh Note OR Click here to enter BP  :1}***    Disposition: Follow up 4 months  with Donato Schultz, MD or APP.  Signed, Sharlene Dory, PA-C 02/13/2023, 11:06 AM [facility]

## 2023-02-17 ENCOUNTER — Ambulatory Visit (HOSPITAL_COMMUNITY): Payer: Medicare HMO | Admitting: Physical Therapy

## 2023-02-19 ENCOUNTER — Ambulatory Visit: Payer: Medicare HMO

## 2023-02-19 VITALS — Ht 64.0 in | Wt 308.0 lb

## 2023-02-19 DIAGNOSIS — Z0000 Encounter for general adult medical examination without abnormal findings: Secondary | ICD-10-CM | POA: Diagnosis not present

## 2023-02-19 DIAGNOSIS — Z1211 Encounter for screening for malignant neoplasm of colon: Secondary | ICD-10-CM

## 2023-02-19 NOTE — Patient Instructions (Signed)
Alexis [name] , Thank you for taking time to come for your Medicare Wellness Visit. I appreciate your ongoing commitment to your health goals. Please review the following plan we discussed and let me know if I can assist you in the future.   Referrals/Orders/Follow-Ups/Clinician Recommendations: Aim for 30 minutes of exercise or brisk walking, 6-8 glasses of water, and 5 servings of fruits and vegetables each day.   You have an order for:  []   2D Mammogram  [x]   3D Mammogram  [x]   Bone Density     Please call for appointment:  [facility] at [facility] [address]iology [place], [place] [address] [phone]  [facility] [address], [place] [address] [phone]   Make sure to wear two-piece clothing.  No lotions, powders, or deodorants the day of the appointment. Make sure to bring picture ID and insurance card.  Bring list of medications you are currently taking including any supplements.   Schedule your [place] screening mammogram through MyChart!   Log into your MyChart account.  Go to 'Visit' (or 'Appointments' if on mobile App) --> Schedule an Appointment  Under 'Select a Reason for Visit' choose the Mammogram Screening option.  Complete the pre-visit questions and select the time and place that best fits your schedule.    This is a list of the screening recommended for you and due dates:  Health Maintenance  Topic Date Due   Complete foot exam   Never done   Eye exam for diabetics  Never done   DTaP/Tdap/Td vaccine (1 - Tdap) Never done   Zoster (Shingles) Vaccine (1 of 2) Never done   DEXA scan (bone density measurement)  Never done   Mammogram  03/07/2022   COVID-19 Vaccine (4 - 2023-24 season) 12/07/2022   Flu Shot  07/06/2023*   Colon Cancer Screening  01/22/2026*   Hemoglobin A1C  04/08/2023   Yearly kidney health urinalysis for diabetes  05/06/2023   Yearly kidney function blood test for diabetes   10/06/2023   Medicare Annual Wellness Visit  02/19/2024   Pneumonia Vaccine (3 of 3 - PPSV23 or PCV20) 11/14/2025   Hepatitis C Screening  Completed   HPV Vaccine  Aged Out  *Topic was postponed. The date shown is not the original due date.    Advanced directives: (Provided) Advance directive discussed with you today. I have provided a copy for you to complete at home and have notarized. Once this is complete, please bring a copy in to our office so we can scan it into your chart.   Next Medicare Annual Wellness Visit scheduled for next year: Yes  Insert Preventive Care attachment Insert FALL PREVENTION attachment if needed

## 2023-02-19 NOTE — Progress Notes (Signed)
Subjective:   Alexis [name] is a 67 y.o. female who presents for Medicare Annual (Subsequent) preventive examination.  Visit Complete: Virtual I connected with  Alexis [name] on 02/19/23 by a audio enabled telemedicine application and verified that I am speaking with the correct person using two identifiers.  Patient Location: Home  Provider Location: Home Office  I discussed the limitations of evaluation and management by telemedicine. The patient expressed understanding and agreed to proceed.  Vital Signs: Because this visit was a virtual/telehealth visit, some criteria may be missing or patient reported. Any vitals not documented were not able to be obtained and vitals that have been documented are patient reported.  Patient Medicare AWV questionnaire was completed by the patient on 02/16/2023; I have confirmed that all information answered by patient is correct and no changes since this date.  Cardiac Risk Factors include: advanced age (>70men, >58 women);diabetes mellitus;dyslipidemia;hypertension;sedentary lifestyle     Objective:    Today's Vitals   02/19/23 1431  Weight: (!) 308 lb (139.7 kg)  Height: 5\' 4"  (1.626 m)   Body mass index is 52.87 kg/m.     02/19/2023    2:46 PM 01/15/2023    1:02 PM 11/02/2013    8:20 PM  Advanced Directives  Does Patient Have a Medical Advance Directive? No No Patient does not have advance directive;Patient would not like information  Would patient like information on creating a medical advance directive? Yes (MAU/Ambulatory/Procedural Areas - Information given) No - Patient declined     Current Medications (verified) Outpatient Encounter Medications as of 02/19/2023  Medication Sig   Accu-Chek Softclix Lancets lancets    acetaminophen (TYLENOL) 500 MG tablet Take 500 mg by mouth 3 times/day as needed-between meals & bedtime for moderate pain. Pt takes 1000 mg 2 tablet bid   albuterol (VENTOLIN HFA) 108 (90 Base) MCG/ACT inhaler  Inhale 2 puffs into the lungs every 4 (four) hours as needed for wheezing.   Blood Glucose Monitoring Suppl DEVI 1 each by Does not apply route in the morning, at noon, and at bedtime. May substitute to any manufacturer covered by patient's insurance.   budesonide-formoterol (SYMBICORT) 160-4.5 MCG/ACT inhaler Inhale 2 puffs into the lungs 2 (two) times daily.   Calcium Carbonate (CALCIUM 600 PO) Take by mouth. 2 chewables by mouth daily- Bariatric fusion   Cholecalciferol 1.25 MG (50000 UT) capsule Take by mouth.   digoxin (LANOXIN) 0.125 MG tablet Take 1 tablet (0.125 mg total) by mouth daily.   famotidine (PEPCID) 20 MG tablet TAKE 1 TABLET BY MOUTH TWICE A DAY   furosemide (LASIX) 40 MG tablet Take 40 mg by mouth as needed.   hydrOXYzine (VISTARIL) 25 MG capsule Take 1 capsule (25 mg total) by mouth every 8 (eight) hours as needed.   levocetirizine (XYZAL) 5 MG tablet Take 1 tablet (5 mg total) by mouth every evening.   losartan (COZAAR) 100 MG tablet Take 1 tablet (100 mg total) by mouth daily.   metoprolol succinate (TOPROL-XL) 25 MG 24 hr tablet TAKE 1 TABLET BY MOUTH TWICE A DAY   montelukast (SINGULAIR) 10 MG tablet TAKE 1 TABLET BY MOUTH EVERY DAY   mupirocin ointment (BACTROBAN) 2 % Apply 1 Application topically 2 (two) times daily.   rosuvastatin (CRESTOR) 5 MG tablet Take 1 tablet (5 mg total) by mouth daily.   RYBELSUS 7 MG TABS Take 1 tablet by mouth daily.   TIADYLT ER 240 MG 24 hr capsule Take by mouth daily.   warfarin (  COUMADIN) 5 MG tablet Take 1 tablet (5 mg total) by mouth daily. Will take along with eliquis for 3 days and then discontinue the eliquis after the 3 days   cephALEXin (KEFLEX) 500 MG capsule Take 1 capsule (500 mg total) by mouth 3 (three) times daily. (Patient not taking: Reported on 02/19/2023)   Multiple Vitamin (MULTIVITAMIN) tablet Take 1 tablet by mouth daily. Bariatric fusion (Patient not taking: Reported on 02/19/2023)   No facility-administered  encounter medications on file as of 02/19/2023.    Allergies (verified) Ace inhibitors, Ciprofloxacin, Cocoa, Flavoring agent, Prednisone, Sulfa antibiotics, and Sulfamethoxazole   History: Past Medical History:  Diagnosis Date   Asthma 01/21/2013   Dr. Sherene Sires   Endometrial cancer [facility]) 01/21/2013   Dr. Clifton James, stage I-10/14   HTN (hypertension)    Morbid obesity (HCC)    Paroxysmal A-fib (HCC)    Uterine cancer (HCC)    Past Surgical History:  Procedure Laterality Date   ABLATION     APPENDECTOMY     DILATION AND CURETTAGE OF UTERUS     NASAL SINUS SURGERY     Family History  Problem Relation Age of Onset   Heart attack Father    Hypertension Father    Diabetes Father    Hyperlipidemia Unknown    Social History   Socioeconomic History   Marital status: Married    Spouse name: Not on file   Number of children: Not on file   Years of education: Not on file   Highest education level: Not on file  Occupational History   Not on file  Tobacco Use   Smoking status: Never   Smokeless tobacco: Never  Substance and Sexual Activity   Alcohol use: No   Drug use: Not on file   Sexual activity: Not on file  Other Topics Concern   Not on file  Social History Narrative   Not on file   Social Determinants of Health   Financial Resource Strain: High Risk (02/19/2023)   Overall Financial Resource Strain (CARDIA)    Difficulty of Paying Living Expenses: Hard  Food Insecurity: No Food Insecurity (02/19/2023)   Hunger Vital Sign    Worried About Running Out of Food in the Last Year: Never true    Ran Out of Food in the Last Year: Never true  Transportation Needs: No Transportation Needs (02/19/2023)   PRAPARE - Administrator, Civil Service (Medical): No    Lack of Transportation (Non-Medical): No  Physical Activity: Inactive (02/19/2023)   Exercise Vital Sign    Days of Exercise per Week: 0 days    Minutes of Exercise per Session: 0 min  Stress: No Stress  Concern Present (02/19/2023)   [employer] of Occupational Health - Occupational Stress Questionnaire    Feeling of Stress : Not at all  Social Connections: Socially Integrated (02/19/2023)   Social Connection and Isolation Panel [NHANES]    Frequency of Communication with Friends and Family: More than three times a week    Frequency of Social Gatherings with Friends and Family: More than three times a week    Attends Religious Services: More than 4 times per year    Active Member of [employer] or Organizations: Yes    Attends Engineer, structural: More than 4 times per year    Marital Status: Married    Tobacco Counseling Counseling given: Not Answered   Clinical Intake:  Pre-visit preparation completed: Yes  Pain : No/denies pain  Nutritional Risks: None Diabetes: Yes CBG done?: No Did pt. bring in CBG monitor from home?: No  How often do you need to have someone help you when you read instructions, pamphlets, or other written materials from your doctor or pharmacy?: 1 - Never  Interpreter Needed?: No  Information entered by :: Renie Ora, LPN   Activities of Daily Living    02/19/2023    2:46 PM  In your present state of health, do you have any difficulty performing the following activities:  Hearing? 0  Vision? 0  Difficulty concentrating or making decisions? 0  Walking or climbing stairs? 0  Dressing or bathing? 0  Doing errands, shopping? 0  Preparing Food and eating ? N  Using the Toilet? N  In the past six months, have you accidently leaked urine? N  Do you have problems with loss of bowel control? N  Managing your Medications? N  Managing your Finances? N  Housekeeping or managing your Housekeeping? N    Patient Care Team: Del Newman Nip, Tenna Child, FNP as PCP - General (Family Medicine) Jake Bathe, MD as PCP - Cardiology (Cardiology) Lanier Prude, MD as PCP - Electrophysiology (Cardiology)  Indicate any recent Medical  Services you may have received from other than [facility] providers in the past year (date may be approximate).     Assessment:   This is a routine wellness examination for [facility].  Hearing/Vision screen Vision Screening - Comments:: Patient call Dr.Davis to schedule appointment    Goals Addressed             This Visit's Progress    Exercise 3x per week (30 min per time)         Depression Screen    02/19/2023    2:44 PM 12/29/2022    1:04 PM 09/11/2022    4:10 PM 05/21/2022    3:44 PM  PHQ 2/9 Scores  PHQ - 2 Score 0 0 0 0  PHQ- 9 Score   2 6    Fall Risk    02/19/2023    2:37 PM 12/29/2022    1:04 PM 09/11/2022    4:10 PM 05/21/2022    3:44 PM 05/01/2022   11:55 AM  Fall Risk   Falls in the past year? 0 0 0 0 0  Number falls in past yr: 0 0 0 0 0  Injury with Fall? 0 0 0 0 0  Risk for fall due to : No Fall Risks  No Fall Risks No Fall Risks   Follow up Falls prevention discussed  Falls evaluation completed Falls evaluation completed Falls evaluation completed    MEDICARE RISK AT HOME: Medicare Risk at Home Any stairs in or around the home?: No If so, are there any without handrails?: No Home free of loose throw rugs in walkways, pet beds, electrical cords, etc?: Yes Adequate lighting in your home to reduce risk of falls?: Yes Life alert?: No Use of a cane, walker or w/c?: No Grab bars in the bathroom?: Yes Shower chair or bench in shower?: Yes Elevated toilet seat or a handicapped toilet?: Yes  TIMED UP AND GO:  Was the test performed?  No    Cognitive Function:        02/19/2023    2:46 PM  6CIT Screen  What Year? 0 points  What month? 0 points  What time? 0 points  Count back from 20 0 points  Months in reverse 0 points  Repeat phrase 0  points  Total Score 0 points    Immunizations Immunization History  Administered Date(s) Administered   Influenza Inj Mdck Quad Pf 02/25/2016, 01/12/2017   Influenza Split 03/26/2013, 02/21/2015, 02/25/2016,  01/12/2017, 01/26/2017, 02/03/2018, 01/07/2019, 01/30/2020   Influenza Whole 02/21/2015   Influenza, Seasonal, Injecte, Preservative Fre 02/21/2015, 02/25/2016   Influenza,inj,Quad PF,6+ Mos 01/07/2019   Influenza,inj,quad, With Preservative 02/25/2016, 01/12/2017   Influenza,trivalent, recombinat, inj, PF 03/26/2013, 02/21/2015, 01/26/2017   Influenza-Unspecified 03/26/2013, 02/21/2015, 02/25/2016, 01/12/2017, 01/26/2017, 02/03/2018, 01/07/2019, 01/30/2020   PFIZER Comirnaty(Gray Top)Covid-19 Tri-Sucrose Vaccine 07/04/2020   PFIZER(Purple Top)SARS-COV-2 Vaccination 11/14/2019, 12/19/2019   Pneumococcal Conjugate-13 03/26/2013   Pneumococcal Polysaccharide-23 02/21/2015   Pneumococcal-Unspecified 02/21/2015, 11/14/2020    TDAP status: Due, Education has been provided regarding the importance of this vaccine. Advised may receive this vaccine at local pharmacy or Health Dept. Aware to provide a copy of the vaccination record if obtained from local pharmacy or Health Dept. Verbalized acceptance and understanding.  Flu Vaccine status: Due, Education has been provided regarding the importance of this vaccine. Advised may receive this vaccine at local pharmacy or Health Dept. Aware to provide a copy of the vaccination record if obtained from local pharmacy or Health Dept. Verbalized acceptance and understanding.  Pneumococcal vaccine status: Up to date  Covid-19 vaccine status: Completed vaccines  Qualifies for Shingles Vaccine? Yes   Zostavax completed No   Shingrix Completed?: No.    Education has been provided regarding the importance of this vaccine. Patient has been advised to call insurance company to determine out of pocket expense if they have not yet received this vaccine. Advised may also receive vaccine at local pharmacy or Health Dept. Verbalized acceptance and understanding.  Screening Tests Health Maintenance  Topic Date Due   FOOT EXAM  Never done   OPHTHALMOLOGY EXAM  Never  done   DTaP/Tdap/Td (1 - Tdap) Never done   Zoster Vaccines- Shingrix (1 of 2) Never done   DEXA SCAN  Never done   MAMMOGRAM  03/07/2022   COVID-19 Vaccine (4 - 2023-24 season) 12/07/2022   INFLUENZA VACCINE  07/06/2023 (Originally 11/06/2022)   Colonoscopy  01/22/2026 (Originally 04/25/2001)   HEMOGLOBIN A1C  04/08/2023   Diabetic kidney evaluation - Urine ACR  05/06/2023   Diabetic kidney evaluation - eGFR measurement  10/06/2023   Medicare Annual Wellness (AWV)  02/19/2024   Pneumonia Vaccine 77+ Years old (3 of 3 - PPSV23 or PCV20) 11/14/2025   Hepatitis C Screening  Completed   HPV VACCINES  Aged Out    Health Maintenance  Health Maintenance Due  Topic Date Due   FOOT EXAM  Never done   OPHTHALMOLOGY EXAM  Never done   DTaP/Tdap/Td (1 - Tdap) Never done   Zoster Vaccines- Shingrix (1 of 2) Never done   DEXA SCAN  Never done   MAMMOGRAM  03/07/2022   COVID-19 Vaccine (4 - 2023-24 season) 12/07/2022    Colorectal cancer screening: Referral to GI placed 02/19/2023. Pt aware the office will call re: appt.  Mammogram status: Ordered 05/21/2022. Pt provided with contact info and advised to call to schedule appt.   Bone Density status: Ordered 04/23/2022. Pt provided with contact info and advised to call to schedule appt.  Lung Cancer Screening: (Low Dose CT Chest recommended if Age 97-80 years, 20 pack-year currently smoking OR have quit w/in 15years.) does not qualify.   Lung Cancer Screening Referral: n/a  Additional Screening:  Hepatitis C Screening: does not qualify; Completed 05/05/2022  Vision Screening: Recommended annual  ophthalmology exams for early detection of glaucoma and other disorders of the eye. Is the patient up to date with their annual eye exam?  No  Who is the provider or what is the name of the office in which the patient attends annual eye exams? Dr.Davis  If pt is not established with a provider, would they like to be referred to a provider to  establish care? No .   Dental Screening: Recommended annual dental exams for proper oral hygiene  Diabetic Foot Exam: Diabetic Foot Exam: Overdue, Pt has been advised about the importance in completing this exam. Pt is scheduled for diabetic foot exam on next office visit .  Community Resource Referral / Chronic Care Management: CRR required this visit?  No   CCM required this visit?  No     Plan:     I have personally reviewed and noted the following in the patient's chart:   Medical and social history Use of alcohol, tobacco or illicit drugs  Current medications and supplements including opioid prescriptions. Patient is not currently taking opioid prescriptions. Functional ability and status Nutritional status Physical activity Advanced directives List of other physicians Hospitalizations, surgeries, and ER visits in previous 12 months Vitals Screenings to include cognitive, depression, and falls Referrals and appointments  In addition, I have reviewed and discussed with patient certain preventive protocols, quality metrics, and best practice recommendations. A written personalized care plan for preventive services as well as general preventive health recommendations were provided to patient.     Lorrene Reid, LPN   32/44/0102   After Visit Summary: (MyChart) Due to this being a telephonic visit, the after visit summary with patients personalized plan was offered to patient via MyChart   Nurse Notes: Due Tdap Vaccine

## 2023-02-20 ENCOUNTER — Ambulatory Visit (HOSPITAL_COMMUNITY): Payer: Medicare HMO | Admitting: Physical Therapy

## 2023-02-24 ENCOUNTER — Other Ambulatory Visit: Payer: Self-pay | Admitting: *Deleted

## 2023-02-24 ENCOUNTER — Encounter: Payer: Self-pay | Admitting: Physician Assistant

## 2023-02-24 MED ORDER — WARFARIN SODIUM 5 MG PO TABS: ORAL_TABLET | ORAL | 5 refills | Status: DC

## 2023-02-24 NOTE — Patient Instructions (Signed)

## 2023-02-24 NOTE — Progress Notes (Unsigned)
   Established Patient Office Visit   Subjective  Patient ID: Alexis [name], female    DOB: 04-Jul-1955  Age: 67 y.o. MRN: [id number]  No chief complaint on file.   She  has a past medical history of Asthma (01/21/2013), Endometrial cancer (HCC) (01/21/2013), HTN (hypertension), Morbid obesity (HCC), Paroxysmal A-fib (HCC), and Uterine cancer (HCC).  HPI  ROS    Objective:     There were no vitals taken for this visit. {Vitals History (Optional):23777}  Physical Exam   No results found for any visits on 02/25/23.  The ASCVD Risk score (Arnett DK, et al., 2019) failed to calculate for the following reasons:   The systolic blood pressure is missing   The valid total cholesterol range is 130 to 320 mg/dL    Assessment & Plan:  There are no diagnoses linked to this encounter.  No follow-ups on file.   [name], FNP

## 2023-02-24 NOTE — Telephone Encounter (Signed)
Refill request for warfarin:  Last INR was 2.4 on 02/11/23 Next INR due 03/02/23 LOV was 01/01/23  Refill approved.

## 2023-02-25 ENCOUNTER — Other Ambulatory Visit: Payer: Self-pay | Admitting: Family Medicine

## 2023-02-25 ENCOUNTER — Ambulatory Visit: Payer: Medicare HMO | Admitting: Family Medicine

## 2023-02-25 MED ORDER — TIRZEPATIDE 2.5 MG/0.5ML ~~LOC~~ SOAJ: 2.5000 mg | SUBCUTANEOUS | 0 refills | Status: DC

## 2023-02-26 ENCOUNTER — Encounter (HOSPITAL_COMMUNITY): Payer: Self-pay | Admitting: Physical Therapy

## 2023-02-26 ENCOUNTER — Ambulatory Visit (HOSPITAL_COMMUNITY): Payer: Medicare HMO | Admitting: Physical Therapy

## 2023-02-26 ENCOUNTER — Telehealth (HOSPITAL_COMMUNITY): Payer: Self-pay | Admitting: Physical Therapy

## 2023-02-26 NOTE — Therapy (Signed)
PHYSICAL THERAPY DISCHARGE SUMMARY  Visits from Start of Care: 5  Current functional level related to goals / functional outcomes: Wound is healed, pt is wearing her compression garment   Remaining deficits: none   Education / Equipment: The importance of wearing compression every day   Patient agrees to discharge. Patient goals were met. Patient is being discharged due to being pleased with the current functional level.   Virgina Organ, PT CLT [phone]

## 2023-02-26 NOTE — Telephone Encounter (Signed)
Pt reports she has received and is wearing her compression and her wounds remain healed and no issues.  PT requests to be discharged at this time.  Evaluating therapist made aware of discharge.   Lurena Nida, PTA/CLT [facility] Ph: [phone]

## 2023-03-04 NOTE — Progress Notes (Signed)
I attest.  Rica Records, MSN, APRN, FNP-C

## 2023-03-10 ENCOUNTER — Ambulatory Visit: Payer: Medicare HMO | Attending: Cardiology | Admitting: *Deleted

## 2023-03-10 DIAGNOSIS — Z5181 Encounter for therapeutic drug level monitoring: Secondary | ICD-10-CM | POA: Diagnosis not present

## 2023-03-10 DIAGNOSIS — I480 Paroxysmal atrial fibrillation: Secondary | ICD-10-CM | POA: Diagnosis not present

## 2023-03-10 LAB — POCT INR: INR: 2.8 (ref 2.0–3.0)

## 2023-03-10 NOTE — Patient Instructions (Signed)
Continue warfarin 1 tablet daily except 1 1/2 tablets on Tuesdays, Thursdays and Saturdays Recheck INR in 5 weeks

## 2023-03-11 ENCOUNTER — Institutional Professional Consult (permissible substitution): Payer: Medicare HMO | Admitting: Internal Medicine

## 2023-03-30 ENCOUNTER — Other Ambulatory Visit: Payer: Self-pay | Admitting: Cardiology

## 2023-03-30 ENCOUNTER — Other Ambulatory Visit: Payer: Self-pay | Admitting: Family Medicine

## 2023-03-30 NOTE — Telephone Encounter (Signed)
Pt needs refill from Cardiology

## 2023-03-30 NOTE — Telephone Encounter (Signed)
Made call to inform pt. No answer. Lvm w/ instructions.

## 2023-03-30 NOTE — Telephone Encounter (Signed)
thanks

## 2023-03-31 ENCOUNTER — Ambulatory Visit: Payer: Self-pay | Admitting: Family Medicine

## 2023-03-31 NOTE — Telephone Encounter (Signed)
Copied from CRM [id number]. Topic: Clinical - Medication Question >> Mar 31, 2023  2:08 PM Antony Haste wrote: Reason for CRM: PT is requesting to speak with one of our nurses pertaining to her medication: inhaler Patient lost her budesonide-formoterol (SYMBICORT) 160-4.5 MCG/ACT inhaler and needs either a sample or another brand. [employer] will not cover replacement until Jan 6. Already starting to have trouble breathing.  Chief Complaint: Wanted to know if primatene mist was the same as symbicort.  Patient was educated.  Symptoms: na Frequency: na Pertinent Negatives: Patient denies symptoms; question only Disposition: [] ED /[] Urgent Care (no appt availability in office) / [] Appointment(In office/virtual)/ []  [facility]/ [x] Home Care/ [] Refused Recommended Disposition /[] [facility]/ []  Follow-up with PCP Additional Notes: Patient has been waiting for PCP office to call her back.  She lost her symbicort inhaler and went out and bought primatene mist.  She wanted to know if they were the same and if she could take it with her other medications.  Patient was educated that they are not the same medication and that symbicort is long acting where as the primatene mist is a rescue inhaler.  Instructed to use with caution.  States she does not have albuterol at home and has not needed a rescue inhaler in some time. Insurance will not cover another inhaler for her until the end of Jan. 2025.  Reason for Disposition  Caller has medicine question only, adult not sick, AND triager answers question  Answer Assessment - Initial Assessment Questions 1. NAME of MEDICINE: "What medicine(s) are you calling about?"     symbicort 2. QUESTION: "What is your question?" (e.g., double dose of medicine, side effect)     Lost my inhaler and bought Primatene mist.  Protocols used: Medication Question Call-A-AH

## 2023-04-02 ENCOUNTER — Other Ambulatory Visit: Payer: Self-pay | Admitting: Family Medicine

## 2023-04-02 ENCOUNTER — Telehealth: Payer: Self-pay

## 2023-04-02 MED ORDER — FLUTICASONE-SALMETEROL 100-50 MCG/ACT IN AEPB: 1.0000 | INHALATION_SPRAY | Freq: Two times a day (BID) | RESPIRATORY_TRACT | 3 refills | Status: DC

## 2023-04-02 NOTE — Telephone Encounter (Signed)
Copied from CRM [id number]. Topic: Clinical - Prescription Issue >> Mar 31, 2023 11:58 AM Shelah Lewandowsky wrote: Reason for CRM: Patient lost her budesonide-formoterol (SYMBICORT) 160-4.5 MCG/ACT inhaler and needs either a sample or another brand. [employer] will not cover replacement until Jan 6. Already starting to have trouble breathing.  Please call patient [phone]

## 2023-04-02 NOTE — Telephone Encounter (Signed)
Left vm

## 2023-04-02 NOTE — Telephone Encounter (Signed)
Ill send another brand

## 2023-04-03 ENCOUNTER — Other Ambulatory Visit: Payer: Self-pay | Admitting: Family Medicine

## 2023-04-09 DIAGNOSIS — H35033 Hypertensive retinopathy, bilateral: Secondary | ICD-10-CM | POA: Diagnosis not present

## 2023-04-09 LAB — HM DIABETES EYE EXAM

## 2023-04-12 NOTE — Progress Notes (Signed)
 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 Last seen  on April 13, 2006 with the  recommendation that she maintain Symbicort  at 160/4.5 two puffs b.i.d.  Take empiric Protonix at 40 mg b.i.d. before meals, which she failed to  do, and try Singulair  10 mg q.p.m. She said that Singulair  did  nothing for her, and stopped it after a couple of weeks but is  convinced that Symbicort  is helping, and that she is using less  albuterol  than normal. It turns out, however, that she is still using  albuterol  4 or 5 times a day. She states she does not typically wake up  at night and need it.   Pulmonary function tests were reviewed from January 22, and indicate an  FEV1 of only 56% predicted with a ratio of 53% and a 15% improvement  after bronchodilators. rec gerd rx plus symbicort  160 2bid > all symptoms resolved once learned technique    08/24/2013 1st  office visit/ [name]   Off gerd rx/ on symbicort  160 2bid and ACEi now Chief Complaint  Patient presents with   Advice Only    Old MW pt to reestablish care for Asthma, sleep study.   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 Why wakes 8 am not refreshed funny in the head and feels drowsy 10- noon. Only drives 10 min and on trips gets drowsy but husband always drives her.  rec Stop lisinopril  Start micardis  80 mg one half daily  You need to breath clean air to reduce your risk of asthma flares  Read for 30 min before bed nightly - if you do wake up no light We will schedule you for a sleep study in one month     08/01/2016  Extended f/u ov/[name] re:  dtc asthma on symb 160 2bid and avg saba  once daily  Chief Complaint  Patient presents with   Follow-up    4wk rov- pt states there was no improvment in breathing the first three weeks after last OV, pt had switched back to lisinopril  due to losartan  not controlling BP. pt reports occ non prod cough clear mucus & mild wheezing  thinks better not due to off acei but due to allergies  better since rained so changed back to acei instead of arb  Using saba q hs x early March 2018 which is a new issue for her and remains a problem, thinks may be due to noct HB rec Plan A = Automatic = Symbicort  80 Take 2 puffs first thing in am and then another 2 puffs about 12 hours later.  Plan B = Backup Only use your albuterol (ventolin )  Whenever cough/ wheeze for any reason or having heart burn >  Try prilosec otc 20mg   Take 30-60 min before first meal of the day and Pepcid  ac (famotidine ) 20 mg one @  bedtime until cough is completely gone for at least a week without the need for cough suppression GERD .  I f breathing/ wheezing / coughing getting worse on this regimen,  You will either need to change back to an ARB (losartan  is the cheapest but may not be the best) and off lisinopril  or Let Dr Jeffrie or your Primary doctor refer you to another specialist for example an Allergist / asthma specialist in [facility] as there is nothing more I can do for you in this circumstance. Please schedule a follow up office visit in 4 weeks, sooner if needed with medication formulary > did not do   email 09/28/16: You were right about the Lisinopril  and for the most part I don't have to much congestion.  I still have some, but I still feel that symbicort  and albuterol  are handling it pretty good.  Not perfect, but pretty good.     11/13/2017  f/u ov/[name] re: asthma with component of uacs flared off gerd rx  Chief Complaint  Patient presents with   Acute Visit    She c/o prod cough for the past 6 wks- clear to light yellow sputum.    Dyspnea:  Limited by  knees > sob  Cough: worse x 2 weeks since stopped ppi / nexium    SABA use: hardly ever 02: no    Now living part time in [place]  taking care of her mother  Rec Resume nexium  20 mg Take 30-60 min before first meal of the day  GERD diet reviewed, bed blocks rec  If breathing cough or congestion worse > Prednisone  10 mg take  4 each am x 2 days,   2 each am x 2 days,  1 each am x 2 days and stop     02/16/2019 Phone visit recs (living in [place] )  Patient has MyChart but needs to change insurance to [employer]  Pt would like [place] & [place] info  If get worse > Prednisone  10 mg take  4 each am x 2 days,   2 each am x 2 days,  1 each am x 2 days and stop  For drainage / throat tickle try take CHLORPHENIRAMINE  4 mg   Other option for nasal symptoms, coughing = trial of singulair  as don't see any evidence in Epic that it has been tried.     04/14/2023 Re-establish ov/[facility]/[name] re: asthma/ uacs  maint on wixella due to insurance and not well controlled    Chief Complaint  Patient presents with   Establish Care   Asthma  Dyspnea:  knees give out before breathing / rides scooter to shop/ uses walker 2 wheels  Cough: none but subjective wheeze day > noct  Sleeping: bed is flat/ one pillow s    resp cc  SABA use: has hfa not using  02: none      No obvious day to day or daytime variability or assoc excess/ purulent sputum or mucus plugs or hemoptysis or cp or chest tightness,   or overt sinus or hb symptoms.    Also denies any obvious fluctuation of symptoms with weather or environmental changes or other aggravating or alleviating factors except as outlined above   No unusual exposure hx or h/o childhood pna or knowledge of premature birth.  Current Allergies, Complete Past Medical History, Past Surgical History, Family History, and Social History were reviewed in [employer] record.  ROS  The following are not active complaints unless bolded Hoarseness, sore  throat, dysphagia, dental problems, itching, sneezing,  nasal congestion or discharge of excess mucus or purulent secretions, ear ache,   fever, chills, sweats, unintended wt loss or wt  gain, classically pleuritic or exertional cp,  orthopnea pnd or arm/hand swelling  or leg swelling, presyncope, palpitations, abdominal pain, anorexia, nausea, vomiting, diarrhea  or change in bowel habits or change in bladder habits, change in stools or change in urine, dysuria, hematuria,  rash, arthralgias, visual complaints, headache, numbness, weakness or ataxia or problems with walking or coordination,  change in mood or  memory.        Current Meds  Medication Sig   Accu-Chek Softclix Lancets lancets    acetaminophen  (TYLENOL ) 500 MG tablet Take 500 mg by mouth 3 times/day as needed-between meals & bedtime for moderate pain. Pt takes 1000 mg 2 tablet bid   albuterol  (VENTOLIN  HFA) 108 (90 Base) MCG/ACT inhaler Inhale 2 puffs into the lungs every 4 (four) hours as needed for wheezing.   Blood Glucose Monitoring Suppl DEVI 1 each by Does not apply route in the morning, at noon, and at bedtime. May substitute to any manufacturer covered by patient's insurance.   Calcium  Carbonate (CALCIUM  600 PO) Take by mouth. 2 chewables by mouth daily- Bariatric fusion   Cholecalciferol 1.25 MG (50000 UT) capsule Take by mouth.   digoxin  (LANOXIN ) 0.125 MG tablet Take 1 tablet (0.125 mg total) by mouth daily.   diltiazem  (CARDIZEM  CD) 240 MG 24 hr capsule Take 240 mg by mouth daily.   famotidine  (PEPCID ) 20 MG tablet TAKE 1 TABLET BY MOUTH TWICE A DAY   fluticasone -salmeterol (WIXELA INHUB) 100-50 MCG/ACT AEPB Inhale 1 puff into the lungs 2 (two) times daily.   furosemide  (LASIX ) 40 MG tablet Take 40 mg by mouth as needed.   hydrOXYzine  (VISTARIL ) 25 MG capsule Take 1 capsule (25 mg total) by mouth every 8 (eight) hours as needed.   levocetirizine (XYZAL ) 5 MG tablet Take 1 tablet (5 mg total) by mouth every evening.    losartan  (COZAAR ) 100 MG tablet Take 1 tablet (100 mg total) by mouth daily.   metoprolol  succinate (TOPROL -XL) 25 MG 24 hr tablet TAKE 1 TABLET BY MOUTH TWICE A DAY   montelukast  (SINGULAIR ) 10 MG tablet TAKE 1 TABLET BY MOUTH EVERY DAY   Multiple Vitamin (MULTIVITAMIN) tablet Take 1 tablet by mouth daily. Bariatric fusion   mupirocin  ointment (BACTROBAN ) 2 % Apply 1 Application topically 2 (two) times daily.   rosuvastatin  (CRESTOR ) 5 MG tablet Take 1 tablet (5 mg total) by mouth daily.   TIADYLT  ER 240 MG 24 hr capsule Take by mouth daily.   tirzepatide  (MOUNJARO ) 2.5 MG/0.5ML Pen Inject 2.5 mg into the skin once a week.   warfarin (COUMADIN ) 5 MG tablet Take 1 tablet daily except 1 1/2 tablets on Tuesdays, Thursdays and Saturdays or as directed                  Objective:   Physical Exam  04/14/2023         324  (BMI 55)   11/13/2017         324  04/22/2017        324  11/07/2016          329  08/01/2016        335  07/01/2016        336   08/24/13 361 lb (163.749 kg)  08/19/13 359 lb (162.841 kg)  03/08/13 369 lb (167.377 kg)     Vital signs reviewed  04/14/2023  - Note at rest 02 sats  95% on RA   General appearance:    MO (by BMI )  w/c bound wf NAD       HEENT : Oropharynx  clear      Nasal turbinates mod non specific edema   NECK :  without  apparent JVD/ palpable Nodes/TM    LUNGS: no acc muscle use,  Nl contour chest with distant exp wheeze  bilaterally without cough on insp or exp maneuvers   CV:  RRR  no s3 or murmur or increase in P2, and 2+ pitting both LE's  R elasltic hose   ABD:  soft and nontender   MS:    ext warm without deformities Or obvious joint restrictions  calf tenderness, cyanosis or clubbing    SKIN: warm and dry without lesions    NEURO:  alert, approp, nl sensorium with  no motor or cerebellar deficits apparent.           Assessment & Plan:

## 2023-04-14 ENCOUNTER — Ambulatory Visit: Payer: Medicare Other | Admitting: Internal Medicine

## 2023-04-14 ENCOUNTER — Encounter: Payer: Self-pay | Admitting: Internal Medicine

## 2023-04-14 VITALS — BP 135/91 | HR 120 | Ht 64.0 in | Wt 324.0 lb

## 2023-04-14 DIAGNOSIS — J4540 Moderate persistent asthma, uncomplicated: Secondary | ICD-10-CM

## 2023-04-14 MED ORDER — BUDESONIDE-FORMOTEROL FUMARATE 80-4.5 MCG/ACT IN AERO: INHALATION_SPRAY | RESPIRATORY_TRACT | 12 refills | Status: DC

## 2023-04-14 MED ORDER — METHYLPREDNISOLONE ACETATE 80 MG/ML IJ SUSP
120.0000 mg | Freq: Once | INTRAMUSCULAR | Status: AC
  Administered 2023-04-14: 120 mg via INTRAMUSCULAR

## 2023-04-14 NOTE — Patient Instructions (Addendum)
 Plan A = Automatic = Always=    Symbicort  160 (Breyna  160) Take 2 puffs first thing in am and then another 2 puffs about 12 hours later.    Work on inhaler technique:  relax and gently blow all the way out then take a nice smooth full deep breath back in, triggering the inhaler at same time you start breathing in.  Hold breath in for at least  5 seconds if you can. Blow out symbicort  / breyna   thru nose. Rinse and gargle with water when done.  If mouth or throat bother you at all,  try brushing teeth/gums/tongue with arm and hammer toothpaste/ make a slurry and gargle and spit out.      Plan B = Backup (to supplement plan A, not to replace it) Only use your albuterol  inhaler as a rescue medication to be used if you can't catch your breath by resting or doing a relaxed purse lip breathing pattern.  - The less you use it, the better it will work when you need it. - Ok to use the inhaler up to 2 puffs  every 4 hours if you must but call for appointment if use goes up over your usual need - Don't leave home without it !!  (think of it like the spare tire for your car)   Plan C = Crisis (instead of Plan B but only if Plan B stops working) - only use your albuterol  nebulizer if you first try Plan B and it fails to help > ok to use the nebulizer up to every 4 hours but if start needing it regularly call for immediate appointment  Depomedrol 120 mg IM    Please schedule a follow up visit in 3 months but call sooner if needed - bring inhalers

## 2023-04-15 ENCOUNTER — Ambulatory Visit: Payer: Medicare Other | Attending: Cardiology

## 2023-04-15 NOTE — Assessment & Plan Note (Signed)
 Onset childhood  - rec trial off acei 08/24/2013 and again 07/01/2016 x 3 weeks only - Spirometry 07/01/2016  FEV1 1.21 (47%)  Ratio 69 mild curvature  - FENO 07/01/2016  =   39  - Allergy  profile 08/01/2016 >  Eos 0.5 /  IgE  12 neg RAST  - 09/28/16 confirmed much better off acei (see email)  - 11/07/2016    try symbicort  80 2bid/ depomedrol 120 - 04/22/2017  After extensive coaching inhaler device  effectiveness =    75% > try symb 160 and avoid pred if possible / add gerd rx = aciphex  as can't tol protonix - 11/13/2017 flared off ppi > resume plus pred x 6 days if not improving on ppi   Was well controlled on symbicort  160 not as well on dpi wixella so will try to see if can get the symbicort  in generic and reconstructed her ABC action  plan today   Re SABA :  I spent extra time with pt today reviewing appropriate use of albuterol  for prn use on exertion with the following points: 1) saba is for relief of sob that does not improve by walking a slower pace or resting but rather if the pt does not improve after trying this first. 2) If the pt is convinced, as many are, that saba helps recover from activity faster then it's easy to tell if this is the case by re-challenging : ie stop, take the inhaler, then p 5 minutes try the exact same activity (intensity of workload) that just caused the symptoms and see if they are substantially diminished or not after saba 3) if there is an activity that reproducibly causes the symptoms, try the saba 15 min before the activity on alternate days   If in fact the saba really does help, then fine to continue to use it prn but advised may need to look closer at the maintenance regimen being used to achieve better control of airways disease with exertion.    F/u 3 m with inhalers

## 2023-04-15 NOTE — Assessment & Plan Note (Signed)
 Body mass index is 55.61 kg/m.  -  trending up again  Lab Results  Component Value Date   TSH 1.780 05/05/2022      Contributing to doe and risk of GERD/dvt/ pe >>>   reviewed the need and the process to achieve and maintain neg calorie balance > defer f/u primary care including intermittently monitoring thyroid  status           Each maintenance medication was reviewed in detail including emphasizing most importantly the difference between maintenance and prns and under what circumstances the prns are to be triggered using an action plan format where appropriate.  Total time for H and P, chart review, counseling, reviewing hfa/neb  device(s) and generating customized AVS unique to this office visit / same day charting > 45 min with pt not seen in > 3y

## 2023-04-16 ENCOUNTER — Ambulatory Visit: Payer: Medicare Other | Attending: Cardiology | Admitting: *Deleted

## 2023-04-16 ENCOUNTER — Encounter: Payer: Self-pay | Admitting: Internal Medicine

## 2023-04-16 DIAGNOSIS — Z5181 Encounter for therapeutic drug level monitoring: Secondary | ICD-10-CM

## 2023-04-16 DIAGNOSIS — I480 Paroxysmal atrial fibrillation: Secondary | ICD-10-CM

## 2023-04-16 LAB — POCT INR: INR: 3 (ref 2.0–3.0)

## 2023-04-16 NOTE — Patient Instructions (Signed)
Continue warfarin 1 tablet daily except 1 1/2 tablets on Tuesdays, Thursdays and Saturdays Recheck INR in 6 weeks.

## 2023-04-20 ENCOUNTER — Other Ambulatory Visit: Payer: Self-pay

## 2023-04-20 MED ORDER — ALBUTEROL SULFATE HFA 108 (90 BASE) MCG/ACT IN AERS: 2.0000 | INHALATION_SPRAY | RESPIRATORY_TRACT | 3 refills | Status: DC | PRN

## 2023-04-21 ENCOUNTER — Encounter: Payer: Self-pay | Admitting: Family Medicine

## 2023-04-23 ENCOUNTER — Other Ambulatory Visit: Payer: Self-pay | Admitting: Family Medicine

## 2023-04-26 ENCOUNTER — Other Ambulatory Visit: Payer: Self-pay | Admitting: Family Medicine

## 2023-04-29 ENCOUNTER — Other Ambulatory Visit: Payer: Self-pay | Admitting: Family Medicine

## 2023-04-29 ENCOUNTER — Encounter: Payer: Self-pay | Admitting: Family Medicine

## 2023-04-29 DIAGNOSIS — E11319 Type 2 diabetes mellitus with unspecified diabetic retinopathy without macular edema: Secondary | ICD-10-CM

## 2023-05-02 ENCOUNTER — Other Ambulatory Visit: Payer: Self-pay | Admitting: Family Medicine

## 2023-05-04 ENCOUNTER — Other Ambulatory Visit: Payer: Self-pay | Admitting: Cardiology

## 2023-05-04 ENCOUNTER — Other Ambulatory Visit: Payer: Self-pay | Admitting: Family Medicine

## 2023-05-28 ENCOUNTER — Ambulatory Visit: Payer: Medicare Other | Attending: Cardiology | Admitting: *Deleted

## 2023-05-28 DIAGNOSIS — Z5181 Encounter for therapeutic drug level monitoring: Secondary | ICD-10-CM

## 2023-05-28 DIAGNOSIS — I480 Paroxysmal atrial fibrillation: Secondary | ICD-10-CM

## 2023-05-28 LAB — POCT INR: INR: 1.9 — AB (ref 2.0–3.0)

## 2023-05-28 NOTE — Patient Instructions (Signed)
Continue warfarin 1 tablet daily except 1 1/2 tablets on Tuesdays, Thursdays and Saturdays Recheck INR in 6 weeks.

## 2023-05-30 ENCOUNTER — Other Ambulatory Visit: Payer: Self-pay | Admitting: Family Medicine

## 2023-06-23 ENCOUNTER — Ambulatory Visit (HOSPITAL_BASED_OUTPATIENT_CLINIC_OR_DEPARTMENT_OTHER): Payer: Self-pay | Admitting: Internal Medicine

## 2023-06-23 ENCOUNTER — Telehealth: Payer: Self-pay

## 2023-06-23 NOTE — Telephone Encounter (Signed)
 [facility]  Asthma  Prednisone   The patient has a history of asthma and reported that she may have gotten a head cold from her spouse.  He was prescribed Mucinex extra strength so she has been taking it as well but her symptoms persist.  She reported a productive cough with clear sputum and some ongoing wheezing since last week.  She denied a fever.  She stated that she always has wheezing even when she was on Symbicort but now it has worsened.  She has not used her rescue inhaler as she is not short of breath.  Her pcp switched her Symbicort to [place] due to affordability.  She is interested in switching back or receiving a more affordable treatment option from her pulmonologist as she prefers Symbicort over [place].  She declined an office visit as she lives in [place] and does not feel well enough to go out today.  She stated that prednisone has helped with these symptoms in the past.  She feels increased chest congestion.  Please advise regarding treatment recommendations.  Her preferred [facility] is [facility] in [place], [place]  [employer] is not covering Symbicort 240 dollars for it and unable to afford.  Requesting if insurance can be contacted or another treatment recommendation**   Reason for Disposition  Wheezing is present  Answer Assessment - Initial Assessment Questions 1. ONSET: "When did the cough begin?"      Last week  2. SEVERITY: "How bad is the cough today?"      Coughed all night  3. SPUTUM: "Describe the color of your sputum" (none, dry cough; clear, white, yellow, green)     Clear  4. HEMOPTYSIS: "Are you coughing up any blood?" If so ask: "How much?" (flecks, streaks, tablespoons, etc.)     None  5. DIFFICULTY BREATHING: "Are you having difficulty breathing?" If Yes, ask: "How bad is it?" (e.g., mild, moderate, severe)    - MILD: No SOB at rest, mild SOB with walking, speaks normally in sentences, can lie down, no retractions, pulse < 100.    - MODERATE: SOB at rest, SOB with minimal  exertion and prefers to sit, cannot lie down flat, speaks in phrases, mild retractions, audible wheezing, pulse 100-120.    - SEVERE: Very SOB at rest, speaks in single words, struggling to breathe, sitting hunched forward, retractions, pulse > 120      Denied  6. FEVER: "Do you have a fever?" If Yes, ask: "What is your temperature, how was it measured, and when did it start?"     No  8. LUNG HISTORY: "Do you have any history of lung disease?"  (e.g., pulmonary embolus, asthma, emphysema)     Asthma  10. OTHER SYMPTOMS: "Do you have any other symptoms?" (e.g., runny nose, wheezing, chest pain)       Wheezing  Protocols used: Cough - Acute Productive-A-AH

## 2023-06-23 NOTE — Telephone Encounter (Signed)
 Copied from CRM [id number]. Topic: General - Other >> Jun 22, 2023  4:30 PM Eunice Blase wrote: Reason for CRM: Pt called stated has chest congestion, mucous clear and would like something called into the pharmacy. Please call pt at  [phone]

## 2023-06-24 ENCOUNTER — Other Ambulatory Visit: Payer: Self-pay | Admitting: Family Medicine

## 2023-06-24 MED ORDER — PROMETHAZINE-DM 6.25-15 MG/5ML PO SYRP: 5.0000 mL | ORAL_SOLUTION | Freq: Four times a day (QID) | ORAL | 0 refills | Status: AC | PRN

## 2023-06-24 NOTE — Telephone Encounter (Signed)
ATC x1 LVM for patient to call our office back. 

## 2023-06-24 NOTE — Telephone Encounter (Signed)
 Office vist or urgent care but I've sent promethazine for the symptoms

## 2023-06-24 NOTE — Telephone Encounter (Signed)
 Can try generic symbicort or breyna in same strength as prior  (80)  2 q 12  Wixella causes cough so I try to avoid it  Can do Prednisone 10 mg take  4 each am x 2 days,   2 each am x 2 days,  1 each am x 2 days and stop and stop wixella for a weeek and juse use the albuterol until sort out cause of cough

## 2023-06-25 MED ORDER — PREDNISONE 10 MG PO TABS: ORAL_TABLET | ORAL | 0 refills | Status: AC

## 2023-06-25 MED ORDER — ALBUTEROL SULFATE HFA 108 (90 BASE) MCG/ACT IN AERS: 2.0000 | INHALATION_SPRAY | RESPIRATORY_TRACT | 0 refills | Status: DC | PRN

## 2023-06-25 NOTE — Telephone Encounter (Signed)
 Called and spoke with patient, advised patient of recommendations per Dr. Sherene Sires.  She stated that she is not sure that another inhaler will be any cheaper than the Symbicort which is $240 since she has a deductible to meet.  She says it was costing only $40.  She was asking for a sample of Symbicort.  I let her know that we do not get samples of Symbicort any longer.  I advised her I would send a message to Dr. Sherene Sires and let him know the situation and see if he wants [place] to get her a sample that we have on hand. I let her know that once we hear back from he we will call her back with his recommendation.   She verbalized understanding.  I verified her pharmacy and sent in the prednisone and refilled her Albuterol.  Dr. Sherene Sires, she has a deductible to meet so she is not sure changing the inhaler will make any difference in the price.  Do you want [place] to get her a sample inhaler?  Please advise.  Thank you.

## 2023-06-25 NOTE — Telephone Encounter (Signed)
 Ok to give breztri x 2 and set up f/u with np to work on long term options

## 2023-06-25 NOTE — Addendum Note (Signed)
 Addended by: Delrae Rend on: 06/25/2023 01:53 PM   Modules accepted: Orders

## 2023-06-26 NOTE — Telephone Encounter (Signed)
 Pt. Stated she was able to be seen by her Pulmonary Doc [place] inhaler to assist with symptoms, as well as albuterol as a rescue.

## 2023-06-26 NOTE — Telephone Encounter (Signed)
 Spoke with patient regarding prior message . Per Dr. Sherene Sires ok to give patient samples of Breztri x2 and made a f/u visit with Tammy on 08/21/2023.  Patient stated her husband will pick up samples before non today .  Patient's voice was understanding.Nothing else further needed.

## 2023-07-02 ENCOUNTER — Ambulatory Visit: Payer: Self-pay | Admitting: Internal Medicine

## 2023-07-02 NOTE — Telephone Encounter (Signed)
 Chief Complaint: asthma exacerbation Symptoms: wheezing/SOB with exertion Frequency: 1 week Pertinent Negatives: Patient denies fever, URI sx, severe SOB Disposition: [] ED /[] Urgent Care (no appt availability in office) / [x] Appointment(In office/virtual)/ []  [facility]/ [] Home Care/ [] Refused Recommended Disposition /[] [facility]/ []  Follow-up with PCP Additional Notes: Pt c/o wheezing/SOB with exertion x 1 week. Reports finished steroid taper prescribed by Dr. Sherene Sires last week and initially had improvement, but sx are now returning. Pt is taking respiratory INH as prescribed, as well as albuterol with relief of sx. Pt reports asthma is controlled, but would likely need abx/additional steroids. [facility] attempted to schedule with Pulm, but no access. Scheduled patient per protocol on 07/03/2023 with PCP. Patient verbalized understanding and to call back/911 with worsening symptoms.     Copied from CRM [id number]. Topic: Clinical - Medication Question >> Jul 02, 2023  3:52 PM Brennan Bailey S wrote: Reason for CRM: patient would like to discuss Prednisone with Dr. Sherene Sires, she states she wants to start taking it again but when I look to see if there is a prescription, there is none, patient would like to speak to someone or even have a phone appointment in regards to it.  [phone] Reason for Disposition  [1] MODERATE longstanding difficulty breathing (e.g., speaks in phrases, SOB even at rest, pulse 100-120) AND [2] SAME as normal  Answer Assessment - Initial Assessment Questions E2C2 Pulmonary Triage - Initial Assessment Questions "Chief Complaint (e.g., cough, sob, wheezing, fever, chills, sweat or additional symptoms) *Go to specific symptom protocol after initial questions. Asthma exacerbation - reports recently finished prednisone last week (took 7 day supply) Requesting abx and steroid Productive yellow cough  "How long have symptoms been present?" X 1 week - initially  improved with prednisone, now sx back    MEDICINES:   "Have you used any OTC meds to help with symptoms?" Yes If yes, ask "What medications?" Mucinex cold and flu - with + relief Robitussin - some relief  "Have you used your inhalers/maintenance medication?" Yes If yes, "What medications?" Breztri - 2 puffs twice a day Albuterol - 2 puffs, 3-4 times a day - reports provides relief  If inhaler, ask "How many puffs and how often?" Note: Review instructions on medication in the chart. See above  OXYGEN: "Do you wear supplemental oxygen?" No If yes, "How many liters are you supposed to use?" N/a  "Do you monitor your oxygen levels?" No If yes, "What is your reading (oxygen level) today?" N/a  "What is your usual oxygen saturation reading?"  (Note: Pulmonary O2 sats should be 90% or greater) N/a   1. RESPIRATORY STATUS: "Describe your breathing?" (e.g., wheezing, shortness of breath, unable to speak, severe coughing)      Wheezing, coughing 2. ONSET: "When did this breathing problem begin?"      X 1 week 3. PATTERN "Does the difficult breathing come and go, or has it been constant since it started?"      constant 4. SEVERITY: "How bad is your breathing?" (e.g., mild, moderate, severe)    - MILD: No SOB at rest, mild SOB with walking, speaks normally in sentences, can lie down, no retractions, pulse < 100.    - MODERATE: SOB at rest, SOB with minimal exertion and prefers to sit, cannot lie down flat, speaks in phrases, mild retractions, audible wheezing, pulse 100-120.    - SEVERE: Very SOB at rest, speaks in single words, struggling to breathe, sitting hunched forward, retractions, pulse > 120  Mild - SOB with exertion No audible wheezing/SOB noted during call 5. RECURRENT SYMPTOM: "Have you had difficulty breathing before?" If Yes, ask: "When was the last time?" and "What happened that time?"      Abx, steroids 6. CARDIAC HISTORY: "Do you have any history of heart  disease?" (e.g., heart attack, angina, bypass surgery, angioplasty)      A-fib 7. LUNG HISTORY: "Do you have any history of lung disease?"  (e.g., pulmonary embolus, asthma, emphysema)     asthma 8. CAUSE: "What do you think is causing the breathing problem?"      exacerbation 9. OTHER SYMPTOMS: "Do you have any other symptoms? (e.g., dizziness, runny nose, cough, chest pain, fever)     Denies above, except cough  Protocols used: Breathing Difficulty-A-AH

## 2023-07-03 ENCOUNTER — Telehealth (INDEPENDENT_AMBULATORY_CARE_PROVIDER_SITE_OTHER): Admitting: Family Medicine

## 2023-07-03 DIAGNOSIS — J208 Acute bronchitis due to other specified organisms: Secondary | ICD-10-CM

## 2023-07-03 DIAGNOSIS — B9689 Other specified bacterial agents as the cause of diseases classified elsewhere: Secondary | ICD-10-CM

## 2023-07-03 MED ORDER — AMOXICILLIN-POT CLAVULANATE 875-125 MG PO TABS: 1.0000 | ORAL_TABLET | Freq: Two times a day (BID) | ORAL | 0 refills | Status: AC

## 2023-07-03 NOTE — Telephone Encounter (Signed)
Noted - no further action needed

## 2023-07-03 NOTE — Assessment & Plan Note (Signed)
 Augmentin 875-125 mg twice daily x 7 days Advise patient to rest to support your body's recovery. Stay hydrated by drinking water, tea, or broth. Using a humidifier can help soothe throat irritation and ease nasal congestion. For fever or pain, acetaminophen (Tylenol) is recommended. To relieve other symptoms, try saline nasal sprays, throat lozenges, or gargling with saltwater. Focus on eating light, healthy meals like fruits and vegetables to keep your strength up. Practice good hygiene by washing your hands frequently and covering your mouth when coughing or sneezing.Follow-up for worsening or persistent symptoms. Patient verbalizes understanding regarding plan of care and all questions answered

## 2023-07-03 NOTE — Progress Notes (Signed)
 Virtual Visit via Video Note  I connected with Alexis [name] on 07/03/23 at 11:20 AM EDT by a video enabled telemedicine application and verified that I am speaking with the correct person using two identifiers.  Patient Location: Home Provider Location: Office/Clinic  I discussed the limitations, risks, security, and privacy concerns of performing an evaluation and management service by video and the availability of in person appointments. I also discussed with the patient that there may be a patient responsible charge related to this service. The patient expressed understanding and agreed to proceed.  Subjective: PCP: Rica Records, FNP  No chief complaint on file.  Patient presents via tele health. Patient complains of cough with yellow sputum.  Patient describes symptoms of fatigue, malaise, nausea, vomiting,  myalgias., chest congestion, diarrhea , slight abdominal cramps. Symptoms began several days ago and are gradually worsening since that time. Patient denies dyspnea or chest pain. Treatment thus far includes OTC analgesics/antipyretics: somewhat effective, anti-tussive: somewhat effective, and Prednisone 7 day course. Past pulmonary history is significant for asthma and occasional episodes of bronchitis         ROS: Per HPI  Current Outpatient Medications:    amoxicillin-clavulanate (AUGMENTIN) 875-125 MG tablet, Take 1 tablet by mouth 2 (two) times daily for 7 days., Disp: 14 tablet, Rfl: 0   Accu-Chek Softclix Lancets lancets, , Disp: , Rfl:    acetaminophen (TYLENOL) 500 MG tablet, Take 500 mg by mouth 3 times/day as needed-between meals & bedtime for moderate pain. Pt takes 1000 mg 2 tablet bid, Disp: , Rfl:    albuterol (VENTOLIN HFA) 108 (90 Base) MCG/ACT inhaler, Inhale 2 puffs into the lungs every 4 (four) hours as needed for wheezing., Disp: 18 g, Rfl: 0   Blood Glucose Monitoring Suppl DEVI, 1 each by Does not apply route in the morning, at noon, and at  bedtime. May substitute to any manufacturer covered by patient's insurance., Disp: 1 each, Rfl: 0   budesonide-formoterol (SYMBICORT) 80-4.5 MCG/ACT inhaler, Take 2 puffs first thing in am and then another 2 puffs about 12 hours later., Disp: 1 each, Rfl: 12   Calcium Carbonate (CALCIUM 600 PO), Take by mouth. 2 chewables by mouth daily- Bariatric fusion, Disp: , Rfl:    Cholecalciferol 1.25 MG (50000 UT) capsule, Take by mouth., Disp: , Rfl:    digoxin (LANOXIN) 0.125 MG tablet, Take 1 tablet (0.125 mg total) by mouth daily., Disp: 90 tablet, Rfl: 1   diltiazem (CARDIZEM CD) 240 MG 24 hr capsule, TAKE 1 CAPSULE BY MOUTH EVERY DAY, Disp: 90 capsule, Rfl: 0   famotidine (PEPCID) 20 MG tablet, TAKE 1 TABLET BY MOUTH TWICE A DAY, Disp: 45 tablet, Rfl: 3   fluticasone-salmeterol (WIXELA INHUB) 100-50 MCG/ACT AEPB, Inhale 1 puff into the lungs 2 (two) times daily., Disp: , Rfl:    furosemide (LASIX) 40 MG tablet, Take 40 mg by mouth as needed., Disp: , Rfl:    hydrOXYzine (VISTARIL) 25 MG capsule, Take 1 capsule (25 mg total) by mouth every 8 (eight) hours as needed., Disp: 30 capsule, Rfl: 0   levocetirizine (XYZAL) 5 MG tablet, Take 1 tablet (5 mg total) by mouth every evening., Disp: 30 tablet, Rfl: 1   losartan (COZAAR) 100 MG tablet, TAKE 1 TABLET BY MOUTH EVERY DAY, Disp: 90 tablet, Rfl: 0   metoprolol succinate (TOPROL-XL) 25 MG 24 hr tablet, TAKE 1 TABLET BY MOUTH TWICE A DAY, Disp: 60 tablet, Rfl: 2   montelukast (SINGULAIR) 10 MG  tablet, TAKE 1 TABLET BY MOUTH EVERY DAY, Disp: 90 tablet, Rfl: 0   Multiple Vitamin (MULTIVITAMIN) tablet, Take 1 tablet by mouth daily. Bariatric fusion, Disp: , Rfl:    mupirocin ointment (BACTROBAN) 2 %, Apply 1 Application topically 2 (two) times daily., Disp: 22 g, Rfl: 0   promethazine-dextromethorphan (PROMETHAZINE-DM) 6.25-15 MG/5ML syrup, Take 5 mLs by mouth 4 (four) times daily as needed., Disp: 118 mL, Rfl: 0   rosuvastatin (CRESTOR) 5 MG tablet, TAKE 1  TABLET (5 MG TOTAL) BY MOUTH DAILY., Disp: 90 tablet, Rfl: 3   TIADYLT ER 240 MG 24 hr capsule, Take by mouth daily., Disp: , Rfl:    tirzepatide (MOUNJARO) 2.5 MG/0.5ML Pen, Inject 2.5 mg into the skin once a week., Disp: 2 mL, Rfl: 0   warfarin (COUMADIN) 5 MG tablet, Take 1 tablet daily except 1 1/2 tablets on Tuesdays, Thursdays and Saturdays or as directed, Disp: 40 tablet, Rfl: 5  Observations/Objective: There were no vitals filed for this visit. Physical Exam Patient is alert and no acute distress noted.   Assessment and Plan: Acute bacterial bronchitis Assessment & Plan: Augmentin 875-125 mg twice daily x 7 days Advise patient to rest to support your body's recovery. Stay hydrated by drinking water, tea, or broth. Using a humidifier can help soothe throat irritation and ease nasal congestion. For fever or pain, acetaminophen (Tylenol) is recommended. To relieve other symptoms, try saline nasal sprays, throat lozenges, or gargling with saltwater. Focus on eating light, healthy meals like fruits and vegetables to keep your strength up. Practice good hygiene by washing your hands frequently and covering your mouth when coughing or sneezing.Follow-up for worsening or persistent symptoms. Patient verbalizes understanding regarding plan of care and all questions answered       Other orders -     Amoxicillin-Pot Clavulanate; Take 1 tablet by mouth 2 (two) times daily for 7 days.  Dispense: 14 tablet; Refill: 0    Follow Up Instructions: No follow-ups on file.   I discussed the assessment and treatment plan with the patient. The patient was provided an opportunity to ask questions, and all were answered. The patient agreed with the plan and demonstrated an understanding of the instructions.   The patient was advised to call back or seek an in-person evaluation if the symptoms worsen or if the condition fails to improve as anticipated.  The above assessment and management plan was  discussed with the patient. The patient verbalized understanding of and has agreed to the management plan.   Alexis Lederer Newman Nip, FNP

## 2023-07-06 ENCOUNTER — Encounter: Payer: Self-pay | Admitting: Family Medicine

## 2023-07-07 ENCOUNTER — Telehealth: Payer: Self-pay

## 2023-07-07 NOTE — Telephone Encounter (Signed)
 Copied from CRM [id number]. Topic: Clinical - Medication Question >> Jul 06, 2023 11:42 AM DeAngela L wrote: Reason for CRM: Patient having diarrhea and taking Imodium and would like to know if there is something stronger to help stop the diarrhea

## 2023-07-08 ENCOUNTER — Other Ambulatory Visit: Payer: Self-pay | Admitting: Family Medicine

## 2023-07-08 DIAGNOSIS — R197 Diarrhea, unspecified: Secondary | ICD-10-CM

## 2023-07-08 NOTE — Telephone Encounter (Signed)
 Messgae has been sent to pt via mychart.

## 2023-07-08 NOTE — Telephone Encounter (Signed)
 Please let the patient know that I have ordered a GI profile stool test to determine the cause of her diarrhea so I can ensure the most effective treatment.  You can come into the clinic  for labs Monday-Friday 8-11 am  or 1-4pm

## 2023-07-08 NOTE — Telephone Encounter (Signed)
 Please let the patient know that I have ordered a GI profile stool test to determine the cause of her diarrhea so I can ensure the most effective treatment.

## 2023-07-11 NOTE — Progress Notes (Deleted)
 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 Last seen  on April 13, 2006 with the  recommendation that she maintain Symbicort  at 160/4.5 two puffs b.i.d.  Take empiric Protonix at 40 mg b.i.d. before meals, which she failed to  do, and try Singulair  10 mg q.p.m. She said that Singulair  did  nothing for her, and stopped it after a couple of weeks but is  convinced that Symbicort  is helping, and that she is using less  albuterol  than normal. It turns out, however, that she is still using  albuterol  4 or 5 times a day. She states she does not typically wake up  at night and need it.   Pulmonary function tests were reviewed from January 22, and indicate an  FEV1 of only 56% predicted with a ratio of 53% and a 15% improvement  after bronchodilators. rec gerd rx plus symbicort  160 2bid > all symptoms resolved once learned technique    08/24/2013 1st  [name] visit/ Alexis [name]   Off gerd rx/ on symbicort  160 2bid and ACEi now Chief Complaint  Patient presents with   Advice Only    Old MW pt to reestablish care for Asthma, sleep study.   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 Why wakes 8 am not refreshed funny in the head and feels drowsy 10- noon. Only drives 10 min and on trips gets drowsy but husband always drives her.  rec Stop lisinopril  Start micardis  80 mg one half daily  You need to breath clean air to reduce your risk of asthma flares  Read for 30 min before bed nightly - if you do wake up no light We will schedule you for a sleep study in one month     08/01/2016  Extended f/u ov/Alexis [name] re:  dtc asthma on symb 160 2bid and avg saba  once daily  Chief Complaint  Patient presents with   Follow-up    4wk rov- pt states there was no improvment in breathing the first three weeks after last OV, pt had switched back to lisinopril  due to losartan  not controlling BP. pt reports occ non prod cough clear mucus & mild wheezing  thinks better not due to off acei but due to allergies  better since rained so changed back to acei instead of arb  Using saba q hs x early March 2018 which is a new issue for her and remains a problem, thinks may be due to noct HB rec Plan A = Automatic = Symbicort  80 Take 2 puffs first thing in am and then another 2 puffs about 12 hours later.  Plan B = Backup Only use your albuterol (ventolin )  Whenever cough/ wheeze for any reason or having heart burn >  Try prilosec otc 20mg   Take 30-60 min before first meal of the day and Pepcid  ac (famotidine ) 20 mg one @  bedtime until cough is completely gone for at least a week without the need for cough suppression GERD .  I f breathing/ wheezing / coughing getting worse on this regimen,  You will either need to change back to an ARB (losartan  is the cheapest but may not be the best) and off lisinopril  or Let Dr Renna Cary or your Primary doctor refer you to another specialist for example an Allergist / asthma specialist in [facility] as there is nothing more I can do for you in this circumstance. Please schedule a follow up [name] visit in 4 weeks, sooner if needed with medication formulary > did not do   email 09/28/16: "You were right about the Lisinopril  and for the most part I don't have to much congestion.  I still have some, but I still feel that symbicort  and albuterol  are handling it pretty good.  Not perfect, but pretty good.  "   11/13/2017  f/u ov/Alexis [name] re: asthma with component of uacs flared off gerd rx  Chief Complaint  Patient presents with   Acute Visit    She c/o prod cough for the past 6 wks- clear to light yellow sputum.    Dyspnea:  Limited by  knees > sob  Cough: worse x 2 weeks since stopped ppi / nexium    SABA use: hardly ever 02: no    Now living part time in [place]  taking care of her mother  Rec Resume nexium  20 mg Take 30-60 min before first meal of the day  GERD diet reviewed, bed blocks rec  If breathing cough or congestion worse > Prednisone  10 mg take  4 each am x 2 days,   2 each am x 2 days,  1 each am x 2 days and stop     02/16/2019 Phone visit recs (living in [place] )  Patient has MyChart but needs to change insurance to [place]  Pt would like [place] & [place] info  If get worse > Prednisone  10 mg take  4 each am x 2 days,   2 each am x 2 days,  1 each am x 2 days and stop  For drainage / throat tickle try take CHLORPHENIRAMINE  4 mg   Other option for nasal symptoms, coughing = trial of singulair  as don't see any evidence in Epic that it has been tried.     04/14/2023 Re-establish ov/[name]/Alexis [name] re: asthma/ uacs  maint on wixella due to insurance and not well controlled    Chief Complaint  Patient presents with   Establish Care   Asthma  Dyspnea:  knees give out before breathing / rides scooter to shop/ uses walker 2 wheels  Cough: none but subjective wheeze day > noct  Sleeping: bed is flat/ one pillow s    resp cc  SABA use: has hfa not using  02: none  Rec Plan A = Automatic = Always=    Symbicort  160 (Breyna  160) Take 2 puffs first thing in am and then another 2 puffs about 12 hours later.  Work on inhaler technique:  Plan B = Backup (to supplement plan A, not to replace it) Only use your albuterol  inhaler as a rescue medication  Plan C = Crisis (instead of Plan B but only if Plan B stops working) - only use your albuterol  nebulizer if you first try Plan B Depomedrol 120 mg IM    Please schedule a follow up visit in 3 months but call sooner if needed - bring inhalers      07/13/2023  f/u ov/Alexis [name]/Alexis [name] re: *** maint on *** did *** bring inhalers No chief complaint on file.    Dyspnea:  *** Cough: ***  Sleeping: ***   resp cc  SABA use: *** 02: ***  Lung cancer screening: ***   No obvious day to day or daytime variability or assoc excess/ purulent sputum or mucus plugs or hemoptysis or cp or chest tightness, subjective wheeze or overt sinus or hb symptoms.    Also denies any obvious fluctuation of symptoms with weather or environmental changes or other aggravating or alleviating factors except as outlined above   No unusual exposure hx or h/o childhood pna/ asthma or knowledge of premature birth.  Current Allergies, Complete Past Medical History, Past Surgical History, Family History, and Social History were reviewed in [employer] record.  ROS  The following are not active complaints unless bolded Hoarseness, sore throat, dysphagia, dental problems, itching, sneezing,  nasal congestion or discharge of excess mucus or purulent secretions, ear ache,   fever, chills, sweats, unintended wt loss or wt gain, classically pleuritic or exertional cp,  orthopnea pnd or arm/hand swelling  or leg swelling, presyncope, palpitations, abdominal pain, anorexia, nausea, vomiting, diarrhea  or change in bowel habits or change in bladder habits, change in stools or change in urine, dysuria, hematuria,  rash, arthralgias, visual complaints, headache, numbness, weakness or ataxia or problems with walking or coordination,  change in mood or  memory.        No outpatient medications have been marked as taking for the 07/13/23 encounter (Appointment) with Diamond Formica, MD.                     Objective:   Physical Exam  07/13/2023          ***  04/14/2023         324  (BMI 55)   11/13/2017         324  04/22/2017        324  11/07/2016          329  08/01/2016        335  07/01/2016        336   08/24/13 361 lb (163.749 kg)  08/19/13 359 lb (162.841 kg)  03/08/13 369 lb (167.377 kg)     Vital signs reviewed  07/13/2023  - Note at rest 02 sats  ***% on ***    General appearance:    ***         distant exp wheeze  bilaterally  2+ pitting both LE's  R elasltic hose***              Assessment & Plan:

## 2023-07-13 ENCOUNTER — Encounter: Payer: Self-pay | Admitting: Internal Medicine

## 2023-07-13 ENCOUNTER — Ambulatory Visit: Payer: Medicare Other | Admitting: Internal Medicine

## 2023-07-21 ENCOUNTER — Ambulatory Visit

## 2023-07-23 ENCOUNTER — Encounter: Payer: Self-pay | Admitting: *Deleted

## 2023-07-23 ENCOUNTER — Encounter

## 2023-07-29 ENCOUNTER — Other Ambulatory Visit: Payer: Self-pay | Admitting: Family Medicine

## 2023-07-29 ENCOUNTER — Ambulatory Visit: Attending: Cardiology

## 2023-07-30 ENCOUNTER — Ambulatory Visit (INDEPENDENT_AMBULATORY_CARE_PROVIDER_SITE_OTHER): Admitting: Family Medicine

## 2023-07-30 ENCOUNTER — Ambulatory Visit (HOSPITAL_COMMUNITY)
Admission: RE | Admit: 2023-07-30 | Discharge: 2023-07-30 | Disposition: A | Discharge status: Home / Self Care | Source: Ambulatory Visit | Attending: Family Medicine | Admitting: Family Medicine

## 2023-07-30 ENCOUNTER — Encounter: Payer: Self-pay | Admitting: Family Medicine

## 2023-07-30 VITALS — BP 150/114 | HR 80 | Ht 64.0 in | Wt 330.0 lb

## 2023-07-30 DIAGNOSIS — R051 Acute cough: Secondary | ICD-10-CM | POA: Insufficient documentation

## 2023-07-30 DIAGNOSIS — E1159 Type 2 diabetes mellitus with other circulatory complications: Secondary | ICD-10-CM | POA: Diagnosis not present

## 2023-07-30 DIAGNOSIS — Z1231 Encounter for screening mammogram for malignant neoplasm of breast: Secondary | ICD-10-CM | POA: Diagnosis not present

## 2023-07-30 DIAGNOSIS — I10 Essential (primary) hypertension: Secondary | ICD-10-CM | POA: Diagnosis not present

## 2023-07-30 DIAGNOSIS — E559 Vitamin D deficiency, unspecified: Secondary | ICD-10-CM | POA: Diagnosis not present

## 2023-07-30 DIAGNOSIS — J4540 Moderate persistent asthma, uncomplicated: Secondary | ICD-10-CM | POA: Diagnosis not present

## 2023-07-30 DIAGNOSIS — M810 Age-related osteoporosis without current pathological fracture: Secondary | ICD-10-CM

## 2023-07-30 DIAGNOSIS — R918 Other nonspecific abnormal finding of lung field: Secondary | ICD-10-CM | POA: Diagnosis not present

## 2023-07-30 DIAGNOSIS — E038 Other specified hypothyroidism: Secondary | ICD-10-CM | POA: Diagnosis not present

## 2023-07-30 DIAGNOSIS — E119 Type 2 diabetes mellitus without complications: Secondary | ICD-10-CM | POA: Diagnosis not present

## 2023-07-30 MED ORDER — DOXYCYCLINE HYCLATE 100 MG PO TABS: 100.0000 mg | ORAL_TABLET | Freq: Two times a day (BID) | ORAL | 0 refills | Status: AC

## 2023-07-30 MED ORDER — BUDESONIDE-FORMOTEROL FUMARATE 80-4.5 MCG/ACT IN AERO: INHALATION_SPRAY | RESPIRATORY_TRACT | 12 refills | Status: DC

## 2023-07-30 MED ORDER — BUDESONIDE-FORMOTEROL FUMARATE 160-4.5 MCG/ACT IN AERO: 2.0000 | INHALATION_SPRAY | Freq: Two times a day (BID) | RESPIRATORY_TRACT | 3 refills | Status: DC

## 2023-07-30 NOTE — Assessment & Plan Note (Signed)
 Chest Xray ordered Start Doxycyline 100 mg twice daily x 7 days Advise patient to rest to support your body's recovery. Stay hydrated by drinking water, tea, or broth. Using a humidifier can help soothe throat irritation and ease nasal congestion. For fever or pain, acetaminophen (Tylenol) is recommended. To relieve other symptoms, try saline nasal sprays, throat lozenges, or gargling with saltwater. Focus on eating light, healthy meals like fruits and vegetables to keep your strength up. Practice good hygiene by washing your hands frequently and covering your mouth when coughing or sneezing.Follow-up for worsening or persistent symptoms. Patient verbalizes understanding regarding plan of care and all questions answered

## 2023-07-30 NOTE — Patient Instructions (Signed)

## 2023-07-30 NOTE — Progress Notes (Signed)
 Established Patient Office Visit   Subjective  Patient ID: Alexis [name], female    DOB: 1955-12-24  Age: 68 y.o. MRN: [id number]  Chief Complaint  Patient presents with   Cough    Deep cough w/ chest congestion, and yellow mucus x 1 month . Started taking previously prescribed antibiotic but did not complete course due to severe diarrhea.     She  has a past medical history of Asthma (01/21/2013), Endometrial cancer (HCC) (01/21/2013), HTN (hypertension), Morbid obesity (HCC), Paroxysmal A-fib (HCC), and Uterine cancer (HCC).  The patient reports a recurrent cough that has been gradually worsening for a few days now. The cough is productive of purulent sputum. Associated symptoms include headache, nasal congestion, rhinorrhea, shortness of breath, and wheezing. Pertinent negatives include no ear congestion, ear pain, fever, or sore throat. Symptoms are worsened by lying down and exertion. Patient previously attempted rest and started Augmentin , but only completed 3 days of treatment. There was no improvement with this approach. Notably, the patient has a history of bronchitis    Review of Systems  Constitutional:  Negative for fever.  HENT:  Negative for ear pain and sore throat.   Respiratory:  Positive for cough, shortness of breath and wheezing.   Cardiovascular:  Negative for chest pain.  Neurological:  Positive for headaches.      Objective:     BP (!) 150/114   Pulse 80   Ht 5\' 4"  (1.626 m)   Wt (!) 330 lb (149.7 kg)   SpO2 94%   BMI 56.64 kg/m  BP Readings from Last 3 Encounters:  07/30/23 (!) 150/114  04/14/23 (!) 135/91  01/12/23 125/71      Physical Exam Vitals reviewed.  Constitutional:      General: She is not in acute distress.    Appearance: Normal appearance. She is not ill-appearing, toxic-appearing or diaphoretic.  HENT:     Head: Normocephalic.  Eyes:     General:        Right eye: No discharge.        Left eye: No discharge.      Conjunctiva/sclera: Conjunctivae normal.  Cardiovascular:     Rate and Rhythm: Normal rate.     Pulses: Normal pulses.     Heart sounds: Normal heart sounds.  Pulmonary:     Effort: Pulmonary effort is normal. No respiratory distress.     Breath sounds: Wheezing and rhonchi present.  Abdominal:     General: Bowel sounds are normal.     Palpations: Abdomen is soft.     Tenderness: There is no abdominal tenderness. There is no guarding.  Skin:    General: Skin is warm and dry.     Capillary Refill: Capillary refill takes less than 2 seconds.  Neurological:     Mental Status: She is alert.     Gait: Gait normal.  Psychiatric:        Mood and Affect: Mood normal.        Behavior: Behavior normal.      No results found for any visits on 07/30/23.  The ASCVD Risk score (Arnett DK, et al., 2019) failed to calculate for the following reasons:   The valid total cholesterol range is 130 to 320 mg/dL    Assessment & Plan:  Type 2 diabetes mellitus without complication, without long-term current use of insulin (HCC) Assessment & Plan: Last Hemoglobin A1c: 6.5 Labs: Ordered today, results pending; will follow up accordingly. The patient reports adhering to  prescribed medications: currently none, patient would like to start ozempic  Reviewed non-pharmacological interventions, including a balanced diet rich in lean proteins, healthy fats, whole grains, and high-fiber vegetables. Emphasized reducing refined sugars and processed carbohydrates, and incorporating more fruits, leafy greens, and legumes. Education: Patient was educated on recognizing signs and symptoms of both hypoglycemia and hyperglycemia, and advised to seek emergency care if these symptoms occur. Follow-Up: Scheduled for follow-up in 3-6 months, or sooner if needed. Patient Understanding: The patient verbalized understanding of the care plan, and all questions were answered. Additional Care: Ophthalmology exam current, Foot  exam results were within normal limits.   Orders: -     Microalbumin / creatinine urine ratio -     Hemoglobin A1c  Acute cough Assessment & Plan: Chest Xray ordered Start Doxycyline 100 mg twice daily x 7 days Advise patient to rest to support your body's recovery. Stay hydrated by drinking water, tea, or broth. Using a humidifier can help soothe throat irritation and ease nasal congestion. For fever or pain, acetaminophen (Tylenol) is recommended. To relieve other symptoms, try saline nasal sprays, throat lozenges, or gargling with saltwater. Focus on eating light, healthy meals like fruits and vegetables to keep your strength up. Practice good hygiene by washing your hands frequently and covering your mouth when coughing or sneezing.Follow-up for worsening or persistent symptoms. Patient verbalizes understanding regarding plan of care and all questions answered      Orders: -     DG Chest 2 View; Future  Primary hypertension -     Lipid panel -     CMP14+EGFR -     CBC with Differential/Platelet  Vitamin D  deficiency -     VITAMIN D  25 Hydroxy (Vit-D Deficiency, Fractures)  TSH (thyroid -stimulating hormone deficiency) -     TSH + free T4  Screening mammogram for breast cancer -     3D Screening Mammogram, Left and Right; Future  Asthma, chronic, moderate persistent, uncomplicated -     Budesonide -Formoterol  Fumarate; Take 2 puffs first thing in am and then another 2 puffs about 12 hours later.  Dispense: 1 each; Refill: 12  Osteoporosis, unspecified osteoporosis type, unspecified pathological fracture presence -     DG Bone Density; Future  Other orders -     Doxycycline  Hyclate; Take 1 tablet (100 mg total) by mouth 2 (two) times daily for 7 days.  Dispense: 14 tablet; Refill: 0 -     Budesonide -Formoterol  Fumarate; Inhale 2 puffs into the lungs 2 (two) times daily.  Dispense: 1 each; Refill: 3    Return in about 6 months (around 01/29/2024), or if symptoms worsen or  fail to improve, for [employer]: Please schedule Mammogram, chronic follow-up.   Avelino Lek Amber Bail, FNP

## 2023-07-30 NOTE — Assessment & Plan Note (Signed)
 Last Hemoglobin A1c: 6.5 Labs: Ordered today, results pending; will follow up accordingly. The patient reports adhering to prescribed medications: currently none, patient would like to start ozempic  Reviewed non-pharmacological interventions, including a balanced diet rich in lean proteins, healthy fats, whole grains, and high-fiber vegetables. Emphasized reducing refined sugars and processed carbohydrates, and incorporating more fruits, leafy greens, and legumes. Education: Patient was educated on recognizing signs and symptoms of both hypoglycemia and hyperglycemia, and advised to seek emergency care if these symptoms occur. Follow-Up: Scheduled for follow-up in 3-6 months, or sooner if needed. Patient Understanding: The patient verbalized understanding of the care plan, and all questions were answered. Additional Care: Ophthalmology exam current, Foot exam results were within normal limits.

## 2023-07-31 ENCOUNTER — Other Ambulatory Visit: Payer: Self-pay | Admitting: Family Medicine

## 2023-07-31 DIAGNOSIS — E1169 Type 2 diabetes mellitus with other specified complication: Secondary | ICD-10-CM

## 2023-07-31 LAB — LIPID PANEL
Chol/HDL Ratio: 3.8 ratio (ref 0.0–4.4)
Cholesterol, Total: 158 mg/dL (ref 100–199)
HDL: 42 mg/dL (ref >39)
LDL Chol Calc (NIH): 96 mg/dL (ref 0–99)
Triglycerides: 108 mg/dL (ref 0–149)
VLDL Cholesterol Cal: 20 mg/dL (ref 5–40)

## 2023-07-31 LAB — HEMOGLOBIN A1C
Est. average glucose Bld gHb Est-mCnc: 137 mg/dL
Hgb A1c MFr Bld: 6.4 % — ABNORMAL HIGH (ref 4.8–5.6)

## 2023-07-31 LAB — CBC WITH DIFFERENTIAL/PLATELET
Basophils Absolute: 0.1 10*3/uL (ref 0.0–0.2)
Basos: 1 %
EOS (ABSOLUTE): 0.5 10*3/uL — ABNORMAL HIGH (ref 0.0–0.4)
Eos: 6 %
Hematocrit: 43.6 % (ref 34.0–46.6)
Hemoglobin: 14.5 g/dL (ref 11.1–15.9)
Immature Grans (Abs): 0 10*3/uL (ref 0.0–0.1)
Immature Granulocytes: 0 %
Lymphocytes Absolute: 1.3 10*3/uL (ref 0.7–3.1)
Lymphs: 16 %
MCH: 30 pg (ref 26.6–33.0)
MCHC: 33.3 g/dL (ref 31.5–35.7)
MCV: 90 fL (ref 79–97)
Monocytes Absolute: 0.4 10*3/uL (ref 0.1–0.9)
Monocytes: 6 %
Neutrophils Absolute: 5.6 10*3/uL (ref 1.4–7.0)
Neutrophils: 71 %
Platelets: 257 10*3/uL (ref 150–450)
RBC: 4.83 x10E6/uL (ref 3.77–5.28)
RDW: 12.7 % (ref 11.7–15.4)
WBC: 7.8 10*3/uL (ref 3.4–10.8)

## 2023-07-31 LAB — CMP14+EGFR
ALT: 13 IU/L (ref 0–32)
AST: 13 IU/L (ref 0–40)
Albumin: 3.9 g/dL (ref 3.9–4.9)
Alkaline Phosphatase: 127 IU/L — ABNORMAL HIGH (ref 44–121)
BUN/Creatinine Ratio: 16 (ref 12–28)
BUN: 12 mg/dL (ref 8–27)
Bilirubin Total: 0.4 mg/dL (ref 0.0–1.2)
CO2: 24 mmol/L (ref 20–29)
Calcium: 8.7 mg/dL (ref 8.7–10.3)
Chloride: 105 mmol/L (ref 96–106)
Creatinine, Ser: 0.77 mg/dL (ref 0.57–1.00)
Globulin, Total: 2.5 g/dL (ref 1.5–4.5)
Glucose: 103 mg/dL — ABNORMAL HIGH (ref 70–99)
Potassium: 4 mmol/L (ref 3.5–5.2)
Sodium: 143 mmol/L (ref 134–144)
Total Protein: 6.4 g/dL (ref 6.0–8.5)
eGFR: 84 mL/min/{1.73_m2} (ref >59)

## 2023-07-31 LAB — TSH+FREE T4
Free T4: 1.2 ng/dL (ref 0.82–1.77)
TSH: 1.61 u[IU]/mL (ref 0.450–4.500)

## 2023-07-31 LAB — VITAMIN D 25 HYDROXY (VIT D DEFICIENCY, FRACTURES): Vit D, 25-Hydroxy: 13.1 ng/mL — ABNORMAL LOW (ref 30.0–100.0)

## 2023-07-31 MED ORDER — OZEMPIC (0.25 OR 0.5 MG/DOSE) 2 MG/3ML ~~LOC~~ SOPN: 0.2500 mg | PEN_INJECTOR | SUBCUTANEOUS | 0 refills | Status: DC

## 2023-08-05 ENCOUNTER — Telehealth: Payer: Self-pay

## 2023-08-05 ENCOUNTER — Encounter: Payer: Self-pay | Admitting: Family Medicine

## 2023-08-05 NOTE — Telephone Encounter (Signed)
 Copied from CRM [id number]. Topic: Clinical - Medical Advice >> Aug 05, 2023 10:23 AM Lizabeth Riggs wrote: Reason for CRM:  Alexis [name]'s x-ray came back that she has pneumonia. She wants to know if she needs to come back in for another appointment or do you call more medication in for pneumonia. She can complete a video appointment. Please call her to let her know what to do. Her number is [phone]

## 2023-08-06 ENCOUNTER — Other Ambulatory Visit: Payer: Self-pay | Admitting: Family Medicine

## 2023-08-06 MED ORDER — CEFDINIR 300 MG PO CAPS
300.0000 mg | ORAL_CAPSULE | Freq: Two times a day (BID) | ORAL | 0 refills | Status: AC
Start: 2023-08-06 — End: 2023-08-13

## 2023-08-06 MED ORDER — FLUCONAZOLE 150 MG PO TABS: 150.0000 mg | ORAL_TABLET | Freq: Once | ORAL | 0 refills | Status: AC

## 2023-08-06 NOTE — Telephone Encounter (Signed)
 Please review results with patient I left comments about her results on my chart I sent in another abx Omnicef  300 mg twice daily for 7 days for her resp infection

## 2023-08-06 NOTE — Telephone Encounter (Signed)
 Sent!

## 2023-08-06 NOTE — Telephone Encounter (Signed)
 Spoke to pt. She understands plan of treatment and partially the results.  She is going to review your message again to ensure she does not have any further questions, she stated that if she did she would reply back to you with them. She  does believe she is experiencing a yeast infection from he first dose of antibiotics. She is requesting  a dose of diflucan  for once both antibiotics are complete to combat the yeast. Please advise if medication is approved.

## 2023-08-07 ENCOUNTER — Encounter: Payer: Self-pay | Admitting: Internal Medicine

## 2023-08-10 ENCOUNTER — Ambulatory Visit (HOSPITAL_COMMUNITY)

## 2023-08-12 ENCOUNTER — Ambulatory Visit: Attending: Cardiology | Admitting: *Deleted

## 2023-08-12 ENCOUNTER — Other Ambulatory Visit: Payer: Self-pay | Admitting: Family Medicine

## 2023-08-12 DIAGNOSIS — Z5181 Encounter for therapeutic drug level monitoring: Secondary | ICD-10-CM | POA: Diagnosis not present

## 2023-08-12 DIAGNOSIS — I480 Paroxysmal atrial fibrillation: Secondary | ICD-10-CM | POA: Diagnosis not present

## 2023-08-12 LAB — POCT INR: INR: 2 (ref 2.0–3.0)

## 2023-08-12 MED ORDER — CLOTRIMAZOLE 3 2 % VA CREA: TOPICAL_CREAM | VAGINAL | 0 refills | Status: AC

## 2023-08-12 NOTE — Telephone Encounter (Signed)
 P.t advice question. Pt. Was told by pharmacist that it is not safe for someone on warfarin to take diflucan . Stating it could cause her to have a stroke. She is concerned and would like your advice if she should still take the prescription or if there is something else she can take. I explained it is only a one time dose and that it last in her system for 3 days that it is not something she will be taking long term or for over 7 days. She is still concerned and would like your advice.

## 2023-08-12 NOTE — Telephone Encounter (Signed)
 Please inform the patient that the risk of interaction between warfarin and a single one-time dose of fluconazole  is typically low and can be safely managed with appropriate monitoring.  However, I have also prescribed an alternative: Clotrimazole 2% vaginal cream, which is a safe option for patients on warfarin due to minimal systemic absorption. Instructions: Apply intravaginally once daily for 3 days.

## 2023-08-12 NOTE — Patient Instructions (Signed)
Continue warfarin 1 tablet daily except 1 1/2 tablets on Tuesdays, Thursdays and Saturdays Recheck INR in 4 weeks.

## 2023-08-17 ENCOUNTER — Inpatient Hospital Stay (HOSPITAL_COMMUNITY): Admission: RE | Admit: 2023-08-17 | Source: Ambulatory Visit

## 2023-08-20 ENCOUNTER — Ambulatory Visit: Payer: Self-pay

## 2023-08-20 ENCOUNTER — Telehealth: Payer: Self-pay | Admitting: Cardiology

## 2023-08-20 ENCOUNTER — Encounter: Payer: Self-pay | Admitting: Family Medicine

## 2023-08-20 ENCOUNTER — Other Ambulatory Visit (HOSPITAL_BASED_OUTPATIENT_CLINIC_OR_DEPARTMENT_OTHER): Payer: Self-pay | Admitting: Internal Medicine

## 2023-08-20 ENCOUNTER — Other Ambulatory Visit: Payer: Self-pay | Admitting: Family Medicine

## 2023-08-20 NOTE — Telephone Encounter (Signed)
 Patient c/o Palpitations:  STAT if patient reporting lightheadedness, shortness of breath, or chest pain  How long have you had palpitations/irregular HR/ Afib? Are you having the symptoms now? Afib for 3-4 days   Are you currently experiencing lightheadedness, SOB or CP? Nothing currently   Do you have a history of afib (atrial fibrillation) or irregular heart rhythm? Yes   Have you checked your BP or HR? (document readings if available):  168/98 Hr 137 currently    Are you experiencing any other symptoms? Stated she had pneumonia recently and still has a cough, fatigue

## 2023-08-20 NOTE — Telephone Encounter (Signed)
 This encounter was created in error - please disregard.

## 2023-08-20 NOTE — Telephone Encounter (Signed)
 Pt says she just got done with her course of antibiotics for pneumonia... she says she still thinks that she is sick. Has a deep cough and SOB.... she says her sputum is now clear though.   She is seeing Dr Waymond Hailey tomorrow morning.   Her BP has been up and down... as has her heart rate... currently it is 160/98 and HR down to 90 but has been up to 134 earlier today.   I will let Dr Renna Cary know and she will up date us  after she sees Dr Waymond Hailey tomorrow... she is afebrile.

## 2023-08-20 NOTE — Telephone Encounter (Signed)
  Chief Complaint: High BP readings, High HR, wet cough, swollen ankles, SOB with any exertion. Symptoms: Above Frequency: Yesterday and today Pertinent Negatives: Patient denies  Disposition: [x] ED /[] Urgent Care (no appt availability in office) / [] Appointment(In office/virtual)/ []  [facility]/ [] Home Care/ [] Refused Recommended Disposition /[] [place] [facility]/ []  Follow-up with PCP Additional Notes: Pt called concerning her elevated BP readings. She has just started monitoring the past 2 days. Her HR is up to 127 and she thinks she is in Afib. She is having SOB with any exertion, a wet cough and both feet and lower legs are very swollen. Pt will go to ED when her husband comes home.     Copied from CRM [id number]. Topic: Clinical - Red Word Triage >> Aug 20, 2023  5:32 PM DeAngela L wrote: Red Word that prompted transfer to Nurse Triage: Patient blood pressure has been jumping all over the place for the last week pulse rate also She has Afib BP 191/123 yesterday and today 161/98 has been high for the last 3 or 4 days and the patient just completed anti biotic and the cardiologist says the pneumonia could be why she has high blood pressure Patient is also having a shortness of breath the entire time she has had pneumonia and when sitting she is breathing fine ,Pt was calling to schedule a f/u appt with PCP but her blood pressure is a concern first  and she needs a new rescue inhale albuterol  (VENTOLIN  HFA) 108 (90 Base) MCG/ACT inhaler she is using this 4 times a day also cause she is short of breath Reason for Disposition  [1] Systolic BP  >= 160 OR Diastolic >= 100 AND [2] cardiac (e.g., breathing difficulty, chest pain) or neurologic symptoms (e.g., new-onset blurred or double vision, unsteady gait)  Answer Assessment - Initial Assessment Questions 1. BLOOD PRESSURE: "What is the blood pressure?" "Did you take at least two measurements 5 minutes apart?"     142/104, p 117,  HR was 152 2. ONSET: "When did you take your blood pressure?"     Right now 3. HOW: "How did you take your blood pressure?" (e.g., automatic home BP monitor, visiting nurse)     Home monitor 4. HISTORY: "Do you have a history of high blood pressure?"     yes 5. MEDICINES: "Are you taking any medicines for blood pressure?" "Have you missed any doses recently?"     Yes - took her losartan  at 11am 6. OTHER SYMPTOMS: "Do you have any symptoms?" (e.g., blurred vision, chest pain, difficulty breathing, headache, weakness)     SOB with exertion.  Protocols used: Blood Pressure - High-A-AH

## 2023-08-21 ENCOUNTER — Ambulatory Visit: Payer: Self-pay | Admitting: Adult Health

## 2023-08-21 ENCOUNTER — Ambulatory Visit: Admitting: Adult Health

## 2023-08-21 ENCOUNTER — Encounter: Payer: Self-pay | Admitting: Adult Health

## 2023-08-21 ENCOUNTER — Ambulatory Visit (HOSPITAL_COMMUNITY)
Admission: RE | Admit: 2023-08-21 | Discharge: 2023-08-21 | Disposition: A | Discharge status: Home / Self Care | Source: Ambulatory Visit | Attending: Adult Health | Admitting: Adult Health

## 2023-08-21 VITALS — BP 110/82 | HR 65 | Ht 64.0 in | Wt 328.4 lb

## 2023-08-21 DIAGNOSIS — R051 Acute cough: Secondary | ICD-10-CM

## 2023-08-21 DIAGNOSIS — R918 Other nonspecific abnormal finding of lung field: Secondary | ICD-10-CM

## 2023-08-21 DIAGNOSIS — I872 Venous insufficiency (chronic) (peripheral): Secondary | ICD-10-CM | POA: Insufficient documentation

## 2023-08-21 DIAGNOSIS — R609 Edema, unspecified: Secondary | ICD-10-CM | POA: Insufficient documentation

## 2023-08-21 DIAGNOSIS — R601 Generalized edema: Secondary | ICD-10-CM

## 2023-08-21 DIAGNOSIS — I480 Paroxysmal atrial fibrillation: Secondary | ICD-10-CM | POA: Diagnosis not present

## 2023-08-21 DIAGNOSIS — J45909 Unspecified asthma, uncomplicated: Secondary | ICD-10-CM | POA: Insufficient documentation

## 2023-08-21 DIAGNOSIS — J4540 Moderate persistent asthma, uncomplicated: Secondary | ICD-10-CM

## 2023-08-21 DIAGNOSIS — N183 Chronic kidney disease, stage 3 unspecified: Secondary | ICD-10-CM | POA: Diagnosis not present

## 2023-08-21 DIAGNOSIS — M7989 Other specified soft tissue disorders: Secondary | ICD-10-CM | POA: Diagnosis not present

## 2023-08-21 LAB — BASIC METABOLIC PANEL WITH GFR
BUN/Creatinine Ratio: 13 (ref 12–28)
BUN: 12 mg/dL (ref 8–27)
CO2: 26 mmol/L (ref 20–29)
Calcium: 9.1 mg/dL (ref 8.7–10.3)
Chloride: 101 mmol/L (ref 96–106)
Creatinine, Ser: 0.89 mg/dL (ref 0.57–1.00)
Glucose: 88 mg/dL (ref 70–99)
Potassium: 4.1 mmol/L (ref 3.5–5.2)
Sodium: 141 mmol/L (ref 134–144)
eGFR: 71 mL/min/{1.73_m2} (ref >59)

## 2023-08-21 LAB — BRAIN NATRIURETIC PEPTIDE: BNP: 125.1 pg/mL — ABNORMAL HIGH (ref 0.0–100.0)

## 2023-08-21 MED ORDER — ALBUTEROL SULFATE (2.5 MG/3ML) 0.083% IN NEBU
2.5000 mg | INHALATION_SOLUTION | Freq: Once | RESPIRATORY_TRACT | Status: AC
  Administered 2023-08-21: 2.5 mg via RESPIRATORY_TRACT

## 2023-08-21 MED ORDER — DILTIAZEM HCL ER COATED BEADS 360 MG PO CP24: 360.0000 mg | ORAL_CAPSULE | Freq: Every day | ORAL | 3 refills | Status: AC

## 2023-08-21 MED ORDER — ALBUTEROL SULFATE (2.5 MG/3ML) 0.083% IN NEBU
2.5000 mg | INHALATION_SOLUTION | Freq: Four times a day (QID) | RESPIRATORY_TRACT | 5 refills | Status: DC | PRN
Start: 2023-08-21 — End: 2023-10-14

## 2023-08-21 MED ORDER — PREDNISONE 10 MG PO TABS: ORAL_TABLET | ORAL | 0 refills | Status: DC

## 2023-08-21 NOTE — Assessment & Plan Note (Signed)
 Lower extremity edema suspect is a component of diastolic dysfunction-patient has had some improvement with restarting Lasix . Will check labs including BMET and BNP -would continue on Lasix  40 mg daily.  If renal function will allow would recommend extra Lasix  today.  Also will need potassium replacement once labs are available we will decide on additional therapy.

## 2023-08-21 NOTE — Progress Notes (Signed)
 @Patient  ID: Alexis [name], female    DOB: 05-18-1955, 68 y.o.   MRN: [id number]  Chief Complaint  Patient presents with   Follow-up    Referring provider: Waneta Gut*  HPI: 68 year old female never smoker followed for asthma History of ACE inhibitor related cough-ACE stopped 2018 Medical history significant for A-fib  TEST/EVENTS :  Spirometry 07/01/2016  FEV1 1.21 (47%)  Ratio 69 mild curvature  - FENO 07/01/2016  =   39  - Allergy  profile 08/01/2016 >  Eos 0.5 /  IgE  12 neg RAST  Pulmonary function tests were reviewed from January 22, and indicate an  FEV1 of only 56% predicted with a ratio of 53% and a 15% improvement  after bronchodilators.  08/21/2023 Follow up : Asthma, Pneumonia  Discussed the use of AI scribe software for clinical note transcription with the patient, who gave verbal consent to proceed.  History of Present Illness   Alexis [name] is a 68 year old female with pneumonia who presents with persistent cough and breathlessness.  She has been experiencing a persistent cough and breathlessness since March. Initially, she was treated with Augmentin , which led to diarrhea without symptom improvement.  She was seen by her primary care provider on July 30, 2023.  Chest x-ray showed left lower lobe opacity suspicious for pneumonia.  Subsequently, she was prescribed doxycycline  on April 24th, which she completed over a week, followed by Omnicef  on May 1st, completing a total of 14 days of antibiotics.  She is feeling better but continues to have ongoing cough with thick mucus.  She also has been having significant leg swelling and weight gain.  She did restart her Lasix  40 mg 3 days ago.  Has not taken any Lasix  today.  She had not been taking the diuretic regularly due to its impact on her daily activities, but resumed it recently due to worsening edema.  She remains on Symbicort  twice daily.  She remains independent lives at home with her husband.  Is able to  drive.  She is retired.  Appetite is good with no nausea vomiting or diarrhea.  Says she has chronic edema in her lower legs and gets skin irritation easily.  Has been using some hydrocortisone cream on her lower extremities to help with the itching.  Says that she has been to the wound center before and had leg wraps for what sounds like stasis dermatitis but says it was too expensive.  She has a history of atrial fibrillation and notes that her heart rate has been elevated recently.       Allergies  Allergen Reactions   Ace Inhibitors     Cough / "congestion"   Ciprofloxacin    Cocoa    Flavoring Agent (Non-Screening)     Makes mouth numb   Prednisone  Other (See Comments)    Raises her blood pressure   Sulfa Antibiotics Nausea And Vomiting    Other reaction(s): Nausea And Vomiting   Sulfamethoxazole Nausea Only    Immunization History  Administered Date(s) Administered   Influenza Inj Mdck Quad Pf 02/25/2016, 01/12/2017   Influenza Split 03/26/2013, 02/21/2015, 02/25/2016, 01/12/2017, 01/26/2017, 02/03/2018, 01/07/2019, 01/30/2020   Influenza Whole 02/21/2015   Influenza, Seasonal, Injecte, Preservative Fre 02/21/2015, 02/25/2016   Influenza,inj,Quad PF,6+ Mos 01/07/2019   Influenza,inj,quad, With Preservative 02/25/2016, 01/12/2017   Influenza,trivalent, recombinat, inj, PF 03/26/2013, 02/21/2015, 01/26/2017   Influenza-Unspecified 03/26/2013, 02/21/2015, 02/25/2016, 01/12/2017, 01/26/2017, 02/03/2018, 01/07/2019, 01/30/2020   PFIZER Comirnaty(Gray Top)Covid-19 Tri-Sucrose Vaccine 07/04/2020  PFIZER(Purple Top)SARS-COV-2 Vaccination 11/14/2019, 12/19/2019   Pfizer(Comirnaty)Fall Seasonal Vaccine 12 years and older 04/14/2023   Pneumococcal Conjugate-13 03/26/2013   Pneumococcal Polysaccharide-23 02/21/2015   Pneumococcal-Unspecified 02/21/2015, 11/14/2020    Past Medical History:  Diagnosis Date   Asthma 01/21/2013   Dr. Waymond Hailey   Endometrial cancer [facility]) 01/21/2013   Dr.  Monroe [place], stage I-10/14   HTN (hypertension)    Morbid obesity (HCC)    Paroxysmal A-fib (HCC)    Uterine cancer (HCC)     Tobacco History: Social History   Tobacco Use  Smoking Status Never  Smokeless Tobacco Never   Counseling given: Not Answered   Outpatient Medications Prior to Visit  Medication Sig Dispense Refill   Accu-Chek Softclix Lancets lancets      acetaminophen (TYLENOL) 500 MG tablet Take 500 mg by mouth 3 times/day as needed-between meals & bedtime for moderate pain. Pt takes 1000 mg 2 tablet bid     Blood Glucose Monitoring Suppl DEVI 1 each by Does not apply route in the morning, at noon, and at bedtime. May substitute to any manufacturer covered by patient's insurance. 1 each 0   budesonide -formoterol  (SYMBICORT ) 160-4.5 MCG/ACT inhaler Inhale 2 puffs into the lungs 2 (two) times daily. 1 each 3   budesonide -formoterol  (SYMBICORT ) 80-4.5 MCG/ACT inhaler Take 2 puffs first thing in am and then another 2 puffs about 12 hours later. 1 each 12   Calcium  Carbonate (CALCIUM  600 PO) Take by mouth. 2 chewables by mouth daily- Bariatric fusion     Cholecalciferol 1.25 MG (50000 UT) capsule Take by mouth.     clotrimazole  (CLOTRIMAZOLE  3) 2 % vaginal cream Apply intravaginally once daily for 3 days 21 g 0   digoxin  (LANOXIN ) 0.125 MG tablet Take 1 tablet (0.125 mg total) by mouth daily. 90 tablet 1   diltiazem  (CARDIZEM  CD) 240 MG 24 hr capsule TAKE 1 CAPSULE BY MOUTH EVERY DAY 90 capsule 0   famotidine  (PEPCID ) 20 MG tablet TAKE 1 TABLET BY MOUTH TWICE A DAY 45 tablet 3   fluticasone -salmeterol (WIXELA INHUB) 100-50 MCG/ACT AEPB Inhale 1 puff into the lungs 2 (two) times daily.     furosemide  (LASIX ) 40 MG tablet Take 40 mg by mouth as needed.     hydrOXYzine  (VISTARIL ) 25 MG capsule Take 1 capsule (25 mg total) by mouth every 8 (eight) hours as needed. 30 capsule 0   levocetirizine (XYZAL ) 5 MG tablet Take 1 tablet (5 mg total) by mouth every evening. 30 tablet 1   losartan   (COZAAR ) 100 MG tablet TAKE 1 TABLET BY MOUTH EVERY DAY 90 tablet 0   metoprolol  succinate (TOPROL -XL) 25 MG 24 hr tablet TAKE 1 TABLET BY MOUTH TWICE A DAY 60 tablet 2   montelukast  (SINGULAIR ) 10 MG tablet TAKE 1 TABLET BY MOUTH EVERY DAY 90 tablet 0   Multiple Vitamin (MULTIVITAMIN) tablet Take 1 tablet by mouth daily. Bariatric fusion     mupirocin  ointment (BACTROBAN ) 2 % Apply 1 Application topically 2 (two) times daily. 22 g 0   promethazine -dextromethorphan (PROMETHAZINE -DM) 6.25-15 MG/5ML syrup Take 5 mLs by mouth 4 (four) times daily as needed. 118 mL 0   rosuvastatin  (CRESTOR ) 5 MG tablet TAKE 1 TABLET (5 MG TOTAL) BY MOUTH DAILY. 90 tablet 3   Semaglutide ,0.25 or 0.5MG /DOS, (OZEMPIC , 0.25 OR 0.5 MG/DOSE,) 2 MG/3ML SOPN Inject 0.25 mg into the skin once a week. 3 mL 0   TIADYLT  ER 240 MG 24 hr capsule Take by mouth daily.  warfarin (COUMADIN ) 5 MG tablet Take 1 tablet daily except 1 1/2 tablets on Tuesdays, Thursdays and Saturdays or as directed 40 tablet 5   albuterol  (VENTOLIN  HFA) 108 (90 Base) MCG/ACT inhaler Inhale 2 puffs into the lungs every 4 (four) hours as needed for wheezing. 18 g 0   No facility-administered medications prior to visit.     Review of Systems:   Constitutional:   No  weight loss, night sweats,  Fevers, chills,+ fatigue, or  lassitude.  HEENT:   No headaches,  Difficulty swallowing,  Tooth/dental problems, or  Sore throat,                No sneezing, itching, ear ache, nasal congestion, post nasal drip,   CV:  No chest pain,  Orthopnea, PND, +swelling in lower extremities, anasarca, dizziness, palpitations, syncope.   GI  No heartburn, indigestion, abdominal pain, nausea, vomiting, diarrhea, change in bowel habits, loss of appetite, bloody stools.   Resp:   No chest wall deformity  Skin: no rash or lesions.  GU: no dysuria, change in color of urine, no urgency or frequency.  No flank pain, no hematuria   MS:  No joint pain or swelling.  No  decreased range of motion.  No back pain.    Physical Exam  BP 110/82 (BP Location: Left Arm)   Pulse 65   Ht 5\' 4"  (1.626 m)   Wt (!) 328 lb 6.4 oz (149 kg)   SpO2 97% Comment: RA  BMI 56.37 kg/m   GEN: A/Ox3; pleasant , NAD, BMI 56, wheelchair   HEENT:  [place]/AT, , NOSE-clear, THROAT-clear, no lesions, no postnasal drip or exudate noted.   NECK:  Supple w/ fair ROM; no JVD; normal carotid impulses w/o bruits; no thyromegaly or nodules palpated; no lymphadenopathy.    RESP expiratory wheezing bilaterally, speaks in full sentences.   no accessory muscle use, no dullness to percussion  CARD:  RRR, no m/r/g, 2-3+ edema peripheral edema, pulses intact, no cyanosis or clubbing.  GI:   Soft & nt; nml bowel sounds; no organomegaly or masses detected.   Musco: Warm bil, no deformities or joint swelling noted.   Neuro: alert, no focal deficits noted.    Skin: Warm, bilateral anterior lower extremities with stasis dermatitis changes and scattered small blisters.    Lab Results:  CBC    Component Value Date/Time   WBC 7.8 07/30/2023 1408   WBC 5.8 01/19/2017 1530   RBC 4.83 07/30/2023 1408   RBC 4.59 01/19/2017 1530   HGB 14.5 07/30/2023 1408   HCT 43.6 07/30/2023 1408   PLT 257 07/30/2023 1408   MCV 90 07/30/2023 1408   MCH 30.0 07/30/2023 1408   MCH 29.6 01/19/2017 1530   MCHC 33.3 07/30/2023 1408   MCHC 33.9 01/19/2017 1530   RDW 12.7 07/30/2023 1408   LYMPHSABS 1.3 07/30/2023 1408   MONOABS 0.2 08/01/2016 1532   EOSABS 0.5 (H) 07/30/2023 1408   BASOSABS 0.1 07/30/2023 1408    BMET    Component Value Date/Time   NA 143 07/30/2023 1408   K 4.0 07/30/2023 1408   CL 105 07/30/2023 1408   CO2 24 07/30/2023 1408   GLUCOSE 103 (H) 07/30/2023 1408   GLUCOSE 106 (H) 01/19/2017 1530   BUN 12 07/30/2023 1408   CREATININE 0.77 07/30/2023 1408   CREATININE 0.92 10/18/2015 1058   CALCIUM  8.7 07/30/2023 1408   GFRNONAA >60 01/19/2017 1530   GFRAA >60 01/19/2017 1530  BNP No results found for: "BNP"  ProBNP    Component Value Date/Time   PROBNP <30.0 05/17/2010 0850    Imaging: DG Chest 2 View Result Date: 08/21/2023 CLINICAL DATA:  Pneumonia. EXAM: CHEST - 2 VIEW COMPARISON:  Chest radiograph dated 07/30/2023. FINDINGS: Similar appearance of left lung base density, likely atelectasis or shadowing from the pericardial fat. No new consolidation. No pleural effusion or pneumothorax. Stable cardiac silhouette. No acute osseous pathology. IMPRESSION: No interval change. Electronically Signed   By: Angus Bark M.D.   On: 08/21/2023 11:05   DG Chest 2 View Result Date: 08/04/2023 CLINICAL DATA:  Acute cough for 1 month. EXAM: CHEST - 2 VIEW COMPARISON:  07/01/2016 FINDINGS: The heart size and mediastinal contours are within normal limits. Opacity is seen in the left lower lobe causing partial silhouetting of the left hemidiaphragm, suspicious for pneumonia. Right lung is clear no pleural effusion. IMPRESSION: Left lower lobe opacity, suspicious for pneumonia. Recommend continued chest radiographic follow-up to confirm resolution. Electronically Signed   By: Marlyce Sine M.D.   On: 08/04/2023 17:43    albuterol  (PROVENTIL ) (2.5 MG/3ML) 0.083% nebulizer solution 2.5 mg     Date Action Dose Route User   08/21/2023 1040 Given 2.5 mg Nebulization Artis, Samantha T, CMA           No data to display          No results found for: "NITRICOXIDE"      Assessment & Plan:   Acute asthmatic bronchitis Slow to resolve asthmatic bronchitis over the last 6 weeks-plus or minus left lower lobe pneumonia. Patient has completed a 14-day course of antibiotics including doxycycline  and Omnicef .  Albuterol  nebulizer treatment given in the office.  Will give a short course of prednisone  over the next week.  Patient education given on prednisone .  Patient says any doses over 40 mg causes her blood pressure to rise.  Advised to continue on a low-salt and low  sugar diet.  Monitor blood pressure closely and call if it is greater than 140/90. Check chest x-ray today. Change Symbicort  to Breztri over the next 5 days sample given.  Then resume Symbicort  twice daily.  Plan  Patient Instructions  Prednisone  taper over next week.  Mucinex DM Twice daily  As needed  cough/congestion  Use Breztri 2 puffs Twice daily  in place of Symbicort  until sample is gone then resume Symbicort  Twice daily   Albuterol  inhaler or neb As needed   Labs today - I will call with results-and directions for additional lasix  if indicated.  Continue on Lasix  40mg  daily.  Low salt diet  Keep legs elevated  Chest xray today  Follow up in 1 week and As needed   Please contact office for sooner follow up if symptoms do not improve or worsen or seek emergency care      Edema Lower extremity edema suspect is a component of diastolic dysfunction-patient has had some improvement with restarting Lasix . Will check labs including BMET and BNP -would continue on Lasix  40 mg daily.  If renal function will allow would recommend extra Lasix  today.  Also will need potassium replacement once labs are available we will decide on additional therapy.  Paroxysmal a-fib Continue follow-up with cardiology. Going forward could consider sleep study to evaluate for underlying sleep apnea  CKD (chronic kidney disease) stage 3, GFR 30-59 ml/min (HCC) History of chronic kidney disease.  Check BMP today.  Stasis dermatitis of both legs Lower extremity stasis dermatitis  changes-skin care discussed in detail.  Keep legs elevated.  Follow-up with primary care. Patient does have some mild redness, continue to monitor for signs of cellulitis.     Roena Clark, NP 08/21/2023

## 2023-08-21 NOTE — Telephone Encounter (Signed)
 Left message for pt to call back to discuss

## 2023-08-21 NOTE — Assessment & Plan Note (Addendum)
 Continue follow-up with cardiology. Going forward could consider sleep study to evaluate for underlying sleep apnea

## 2023-08-21 NOTE — Assessment & Plan Note (Signed)
 Lower extremity stasis dermatitis changes-skin care discussed in detail.  Keep legs elevated.  Follow-up with primary care. Patient does have some mild redness, continue to monitor for signs of cellulitis.

## 2023-08-21 NOTE — Telephone Encounter (Signed)
 Called back and spoke with pt regarding increasing Diltiazem  to 360 mg daily.  RX sent into [facility] as requested.

## 2023-08-21 NOTE — Patient Instructions (Signed)
 Prednisone  taper over next week.  Mucinex DM Twice daily  As needed  cough/congestion  Use Breztri 2 puffs Twice daily  in place of Symbicort  until sample is gone then resume Symbicort  Twice daily   Albuterol  inhaler or neb As needed   Labs today - I will call with results-and directions for additional lasix  if indicated.  Continue on Lasix  40mg  daily.  Low salt diet  Keep legs elevated  Chest xray today  Follow up in 1 week and As needed   Please contact office for sooner follow up if symptoms do not improve or worsen or seek emergency care

## 2023-08-21 NOTE — Assessment & Plan Note (Signed)
 History of chronic kidney disease.  Check BMP today.

## 2023-08-21 NOTE — Assessment & Plan Note (Addendum)
 Slow to resolve asthmatic bronchitis over the last 6 weeks-plus or minus left lower lobe pneumonia. Patient has completed a 14-day course of antibiotics including doxycycline  and Omnicef .  Albuterol  nebulizer treatment given in the office.  Will give a short course of prednisone  over the next week.  Patient education given on prednisone .  Patient says any doses over 40 mg causes her blood pressure to rise.  Advised to continue on a low-salt and low sugar diet.  Monitor blood pressure closely and call if it is greater than 140/90. Check chest x-ray today. Change Symbicort  to Breztri over the next 5 days sample given.  Then resume Symbicort  twice daily.  Plan  Patient Instructions  Prednisone  taper over next week.  Mucinex DM Twice daily  As needed  cough/congestion  Use Breztri 2 puffs Twice daily  in place of Symbicort  until sample is gone then resume Symbicort  Twice daily   Albuterol  inhaler or neb As needed   Labs today - I will call with results-and directions for additional lasix  if indicated.  Continue on Lasix  40mg  daily.  Low salt diet  Keep legs elevated  Chest xray today  Follow up in 1 week and As needed   Please contact office for sooner follow up if symptoms do not improve or worsen or seek emergency care

## 2023-08-23 ENCOUNTER — Other Ambulatory Visit: Payer: Self-pay | Admitting: Family Medicine

## 2023-08-23 NOTE — Progress Notes (Addendum)
 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 Last seen  on April 13, 2006 with the  recommendation that she maintain Symbicort  at 160/4.5 two puffs b.i.d.  Take empiric Protonix at 40 mg b.i.d. before meals, which she failed to  do, and try Singulair  10 mg q.p.m. She said that Singulair  did  nothing for her, and stopped it after a couple of weeks but is  convinced that Symbicort  is helping, and that she is using less  albuterol  than normal. It turns out, however, that she is still using  albuterol  4 or 5 times a day. She states she does not typically wake up  at night and need it.   Pulmonary function tests were reviewed from January 22, and indicate an  FEV1 of only 56% predicted with a ratio of 53% and a 15% improvement  after bronchodilators. rec gerd rx plus symbicort  160 2bid > all symptoms resolved once learned technique    08/24/2013 1st  office visit/ Yenty Bloch   Off gerd rx/ on symbicort  160 2bid and ACEi now Chief Complaint  Patient presents with   Advice Only    Old MW pt to reestablish care for Asthma, sleep study.   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 Why wakes 8 am not refreshed funny in the head and feels drowsy 10- noon. Only drives 10 min and on trips gets drowsy but husband always drives her.  rec Stop lisinopril  Start micardis  80 mg one half daily  You need to breath clean air to reduce your risk of asthma flares  Read for 30 min before bed nightly - if you do wake up no light We will schedule you for a sleep study in one month     08/01/2016  Extended f/u ov/Elvert Cumpton re:  dtc asthma on symb 160 2bid and avg saba once  daily  Chief Complaint  Patient presents with   Follow-up    4wk rov- pt states there was no improvment in breathing the first three weeks after last OV, pt had switched back to lisinopril  due to losartan  not controlling BP. pt reports occ non prod cough clear mucus & mild wheezing  thinks better not due to off acei but due to allergies  better since rained so changed back to acei instead of arb  Using saba q hs x early March 2018 which is a new issue for her and remains a problem, thinks may be due to noct HB rec Plan A = Automatic = Symbicort  80 Take 2 puffs first thing in am and then another 2 puffs about 12 hours later.  Plan B = Backup Only use your albuterol (ventolin )  Whenever cough/ wheeze for any reason or having heart burn >  Try prilosec otc 20mg   Take 30-60 min before first meal of the day and Pepcid  ac (famotidine ) 20 mg one @  bedtime until cough is completely gone for at least a week without the need for cough suppression GERD .  I f breathing/ wheezing / coughing getting worse on this regimen,  You  will either need to change back to an ARB (losartan  is the cheapest but may not be the best) and off lisinopril  or Let Dr Renna Cary or your Primary doctor refer you to another specialist for example an Allergist / asthma specialist in [facility] as there is nothing more I can do for you in this circumstance. Please schedule a follow up office visit in 4 weeks, sooner if needed with medication formulary > did not do   email 09/28/16: "You were right about the Lisinopril  and for the most part I don't have to much congestion.  I still have some, but I still feel that symbicort  and albuterol  are handling it pretty good.  Not perfect, but pretty good.  "   11/13/2017  f/u ov/Gena Laski re: asthma with component of uacs flared off gerd rx  Chief Complaint  Patient presents with   Acute Visit    She c/o prod cough for the past 6 wks- clear to light yellow sputum.    Dyspnea:  Limited by knees >  sob  Cough: worse x 2 weeks since stopped ppi / nexium   SABA use: hardly ever 02: no   Now living part time in [place]  taking care of her mother  Rec Resume nexium  20 mg Take 30-60 min before first meal of the day  GERD diet reviewed, bed blocks rec  If breathing cough or congestion worse > Prednisone  10 mg take  4 each am x 2 days,   2 each am x 2 days,  1 each am x 2 days and stop    02/16/2019 Phone visit recs (living in [place] )  Patient has MyChart but needs to change insurance to [place]  Pt would like [place] & [place] info  If get worse > Prednisone  10 mg take  4 each am x 2 days,   2 each am x 2 days,  1 each am x 2 days and stop  For drainage / throat tickle try take CHLORPHENIRAMINE  4 mg   Other option for nasal symptoms, coughing = trial of singulair  as don't see any evidence in Epic that it has been tried.     04/14/2023 Re-establish ov/[place] office/Malaney Mcbean re: asthma/ uacs  maint on wixella due to insurance and not well controlled    Chief Complaint  Patient presents with   Establish Care   Asthma  Dyspnea:  knees give out before breathing / rides scooter to shop/ uses walker 2 wheels at home  Cough: none but subjective wheeze day > noct  Sleeping: bed is flat/ one pillow s    resp cc  SABA use: has hfa not using  02: none  Rec Plan A = Automatic = Always=    Symbicort  160 (Breyna  160) Take 2 puffs first thing in am and then another 2 puffs about 12 hours later.  Work on inhaler technique:    Plan B = Backup (to supplement plan A, not to replace it) Only use your albuterol  inhaler as a rescue medication  Plan C = Crisis (instead of Plan B but only if Plan B stops working) - only use your albuterol  nebulizer if you first try Plan B Depomedrol 120 mg IM   Please schedule a follow up visit in 3 months but call sooner if needed - bring inhalers   Onset cough march 2025 and ? Pna dx rx doxy /amox/ pred   08/25/2023  f/u ov/[place] office/Ricahrd Schwager re: symbicort  160  maint on  symbicort  160  did not  bring inhalers  on prednisone  taper now at  10 mg x 3 each am   Chief Complaint  Patient presents with   Asthma  Dyspnea:  uses 2 wheeled walker at home but w/c to go out  Cough: not purulent  minimal vol  Sleeping: bed is flat/ 2 pillows with cough better with less saba noct  SABA use: neb before bed  02: none    No obvious day to day or daytime variability or assoc excess/ purulent sputum or mucus plugs or hemoptysis or cp or chest tightness, subjective wheeze or overt sinus or hb symptoms.    Also denies any obvious fluctuation of symptoms with weather or environmental changes or other aggravating or alleviating factors except as outlined above   No unusual exposure hx or h/o childhood pna  or knowledge of premature birth.  Current Allergies, Complete Past Medical History, Past Surgical History, Family History, and Social History were reviewed in [employer] record.  ROS  The following are not active complaints unless bolded Hoarseness, sore throat, dysphagia, dental problems, itching, sneezing,  nasal congestion or discharge of excess mucus or purulent secretions, ear ache,   fever, chills, sweats, unintended wt loss or wt gain, classically pleuritic or exertional cp,  orthopnea pnd or arm/hand swelling  or leg swelling, presyncope, palpitations, abdominal pain, anorexia, nausea, vomiting, diarrhea  or change in bowel habits or change in bladder habits, change in stools or change in urine, dysuria, hematuria,  rash, arthralgias, visual complaints, headache, numbness, weakness or ataxia or problems with walking or coordination,  change in mood or  memory.        Current Meds  Medication Sig   Accu-Chek Softclix Lancets lancets    acetaminophen (TYLENOL) 500 MG tablet Take 500 mg by mouth 3 times/day as needed-between meals & bedtime for moderate pain. Pt takes 1000 mg 2 tablet bid   albuterol  (PROVENTIL ) (2.5 MG/3ML) 0.083% nebulizer  solution Take 3 mLs (2.5 mg total) by nebulization every 6 (six) hours as needed for wheezing or shortness of breath.   albuterol  (VENTOLIN  HFA) 108 (90 Base) MCG/ACT inhaler Inhale 2 puffs into the lungs every 6 (six) hours as needed for wheezing or shortness of breath.   Blood Glucose Monitoring Suppl DEVI 1 each by Does not apply route in the morning, at noon, and at bedtime. May substitute to any manufacturer covered by patient's insurance.   budesonide -formoterol  (SYMBICORT ) 160-4.5 MCG/ACT inhaler Inhale 2 puffs into the lungs 2 (two) times daily.   budesonide -formoterol  (SYMBICORT ) 80-4.5 MCG/ACT inhaler Take 2 puffs first thing in am and then another 2 puffs about 12 hours later.   Calcium  Carbonate (CALCIUM  600 PO) Take by mouth. 2 chewables by mouth daily- Bariatric fusion   Cholecalciferol 1.25 MG (50000 UT) capsule Take by mouth.   clotrimazole  (CLOTRIMAZOLE  3) 2 % vaginal cream Apply intravaginally once daily for 3 days   digoxin  (LANOXIN ) 0.125 MG tablet Take 1 tablet (0.125 mg total) by mouth daily.   diltiazem  (CARDIZEM  CD) 360 MG 24 hr capsule Take 1 capsule (360 mg total) by mouth daily.   famotidine  (PEPCID ) 20 MG tablet TAKE 1 TABLET BY MOUTH TWICE A DAY   fluticasone -salmeterol (WIXELA INHUB) 100-50 MCG/ACT AEPB Inhale 1 puff into the lungs 2 (two) times daily.   furosemide  (LASIX ) 40 MG tablet Take 40 mg by mouth as needed.   hydrOXYzine  (VISTARIL ) 25 MG capsule Take 1 capsule (25 mg total) by mouth every 8 (eight) hours as  needed.   levocetirizine (XYZAL ) 5 MG tablet Take 1 tablet (5 mg total) by mouth every evening.   losartan  (COZAAR ) 100 MG tablet TAKE 1 TABLET BY MOUTH EVERY DAY   metoprolol  succinate (TOPROL -XL) 25 MG 24 hr tablet TAKE 1 TABLET BY MOUTH TWICE A DAY   montelukast  (SINGULAIR ) 10 MG tablet TAKE 1 TABLET BY MOUTH EVERY DAY   Multiple Vitamin (MULTIVITAMIN) tablet Take 1 tablet by mouth daily. Bariatric fusion   mupirocin  ointment (BACTROBAN ) 2 % Apply 1  Application topically 2 (two) times daily.   predniSONE  (DELTASONE ) 10 MG tablet 4 tabs for 2 days, then 3 tabs for 2 days, 2 tabs for 2 days, then 1 tab for 2 days, then stop   promethazine -dextromethorphan (PROMETHAZINE -DM) 6.25-15 MG/5ML syrup Take 5 mLs by mouth 4 (four) times daily as needed.   rosuvastatin  (CRESTOR ) 5 MG tablet TAKE 1 TABLET (5 MG TOTAL) BY MOUTH DAILY.   Semaglutide ,0.25 or 0.5MG /DOS, (OZEMPIC , 0.25 OR 0.5 MG/DOSE,) 2 MG/3ML SOPN Inject 0.25 mg into the skin once a week.   TIADYLT  ER 240 MG 24 hr capsule Take by mouth daily.   warfarin (COUMADIN ) 5 MG tablet Take 1 tablet daily except 1 1/2 tablets on Tuesdays, Thursdays and Saturdays or as directed                     Objective:   Physical Exam  08/25/2023       329  04/14/2023         324  (BMI 55)   11/13/2017         324  04/22/2017        324  11/07/2016          329  08/01/2016        335  07/01/2016        336   08/24/13 361 lb (163.749 kg)  08/19/13 359 lb (162.841 kg)  03/08/13 369 lb (167.377 kg)       Vital signs reviewed  08/25/2023  - Note at rest 02 sats  93% on RA   General appearance:    w/c bound MO (by BMI) barking quality dry cough with mild pseudowheeze resolves with PLM   HEENT : Oropharynx  clear      Nasal turbinates nl    NECK :  without  apparent JVD/ palpable Nodes/TM    LUNGS: no acc muscle use,  Nl contour chest which is clear to A and P bilaterally without cough on insp or exp maneuvers   CV:  RRR  no s3 or murmur or increase in P2, and 2+ pitting both LE and venous stasis dermatitis  ABD:  obese soft and nontender   MS:     ext warm without deformities Or obvious joint restrictions  calf tenderness, cyanosis or clubbing    SKIN: warm and dry without lesions    NEURO:  alert, approp, nl sensorium with  no motor or cerebellar deficits apparent.     I personally reviewed images and  impression as follows:  CXR:   pa and lateral  08/21/23   ?LLL airspace dz but  technically difficult du to body habitus.     Assessment & Plan:

## 2023-08-24 ENCOUNTER — Telehealth: Payer: Self-pay

## 2023-08-24 NOTE — Telephone Encounter (Signed)
 Copied from CRM [id number]. Topic: Clinical - Lab/Test Results >> Aug 21, 2023  3:51 PM Crist Dominion wrote: Reason for CRM: [employer] called to relayed the following  STAT results: BMP results  glucose at 88  bun at 12  creatine 0.89  EGFR 71 BUN creatine ration 13 Sodium at 142 Potasium 4.1 Chloride is at 101 Co2 26 Calcium  9.1  For any questions please call [facility] at [phone]

## 2023-08-24 NOTE — Telephone Encounter (Signed)
 CALL REPORT - FYI

## 2023-08-24 NOTE — Telephone Encounter (Signed)
 Ok

## 2023-08-25 ENCOUNTER — Ambulatory Visit: Admitting: Internal Medicine

## 2023-08-25 ENCOUNTER — Encounter: Payer: Self-pay | Admitting: Internal Medicine

## 2023-08-25 VITALS — BP 126/73 | HR 88 | Ht 64.0 in | Wt 329.0 lb

## 2023-08-25 DIAGNOSIS — J4540 Moderate persistent asthma, uncomplicated: Secondary | ICD-10-CM | POA: Diagnosis not present

## 2023-08-25 DIAGNOSIS — R058 Other specified cough: Secondary | ICD-10-CM

## 2023-08-25 MED ORDER — BUDESONIDE-FORMOTEROL FUMARATE 80-4.5 MCG/ACT IN AERO: INHALATION_SPRAY | RESPIRATORY_TRACT | 12 refills | Status: DC

## 2023-08-25 NOTE — Assessment & Plan Note (Addendum)
 Body mass index is 56.47 kg/m.  -  trending up  Lab Results  Component Value Date   TSH 1.610 07/30/2023   Contributing to doe and risk of GERD/ worsening venous stasis and DVT / cellulitis risk  >>>   reviewed the need and the process to achieve and maintain neg calorie balance > defer f/u primary care including intermittently monitoring thyroid  status

## 2023-08-25 NOTE — Patient Instructions (Signed)
 Plan A = Automatic = Always=    Symbicort  80 Take 2 puffs first thing in am and then another 2 puffs about 12 hours later.    Plan B = Backup (to supplement plan A, not to replace it) Only use your albuterol  inhaler as a rescue medication to be used if you can't catch your breath by resting or doing a relaxed purse lip breathing pattern.  - The less you use it, the better it will work when you need it. - Ok to use the inhaler up to 2 puffs  every 4 hours if you must but call for appointment if use goes up over your usual need - Don't leave home without it !!  (think of it like the spare tire for your car)   Plan C = Crisis (instead of Plan B but only if Plan B stops working) - only use your albuterol  nebulizer if you first try Plan B and it fails to help > ok to use the nebulizer up to every 4 hours but if start needing it regularly call for immediate appointment   Finish prednisone    For cough/ congestion >  mucinex dm up to maximum of  1200 mg every 12 hours and use the flutter valve as much as you can    GERD (REFLUX)  is an extremely common cause of respiratory symptoms just like yours , many times with no obvious heartburn at all.    It can be treated with medication, but also with lifestyle changes including elevation of the head of your bed (ideally with 6 -8inch blocks under the headboard of your bed),  Smoking cessation, avoidance of late meals, excessive alcohol, and avoid fatty foods, chocolate, peppermint, colas, red wine, and acidic juices such as orange juice.  NO MINT OR MENTHOL PRODUCTS SO NO COUGH DROPS  USE SUGARLESS CANDY INSTEAD (Jolley ranchers or Stover's or Life Savers) or even ice chips will also do - the key is to swallow to prevent all throat clearing. NO OIL BASED VITAMINS - use powdered substitutes.  Avoid fish oil when coughing.   Please schedule a follow up office visit in 6 weeks, call sooner if needed with inhalers

## 2023-08-25 NOTE — Assessment & Plan Note (Addendum)
 Onset childhood  - rec trial off acei 08/24/2013 and again 07/01/2016 x 3 weeks only - Spirometry 07/01/2016  FEV1 1.21 (47%)  Ratio 69 mild curvature  - FENO 07/01/2016  =   39  - Allergy  profile 08/01/2016 >  Eos 0.5 /  IgE  12 neg RAST  - 09/28/16 confirmed much better off acei (see email)  - 11/07/2016    try symbicort  80 2bid/ depomedrol 120 - 04/22/2017  After extensive coaching inhaler device  effectiveness =    75% > try symb 160 and avoid pred if possible / add gerd rx = aciphex  as can't tol protonix - 11/13/2017 flared off ppi > resume plus pred x 6 days if not improving on ppi   >>>08/25/2023 trial of symbicort  80 2bid for uacs / cough variant asthma  (higher doses may be aggravating cough and contributing to risk of recurrent pna)   No further abx needed   Continue high dose pepcid  since can't tol ppi  F/u ov 6 weeks sooner if needed

## 2023-08-26 NOTE — Assessment & Plan Note (Addendum)
 Onset ? 2015 on ACEi d/c 08/2013 and intol of ppi   Flare in setting of URI/ ? Pna LLL mar[place] 2025 while on symbicort  160 > changed to symbiort 80 2bid 08/25/2023 >>> f/u in 6 week with all inhalers in hand  using a trust but verify approach to confirm accurate Medication  Reconciliation The principal here is that until we are certain that the  patients are doing what we've asked, it makes no sense to ask them to do more.   Comment  Upper airway cough syndrome (previously labeled PNDS),  is so named because it's frequently impossible to sort out how much is  CR/sinusitis with freq throat clearing (which can be related to primary GERD)   vs  causing  secondary (" extra esophageal")  GERD from wide swings in gastric pressure that occur with throat clearing, often  promoting self use of mint and menthol lozenges that reduce the lower esophageal sphincter tone and exacerbate the problem further in a cyclical fashion.   These are the same pts (now being labeled as having "irritable larynx syndrome" by some cough centers) who not infrequently have a history of having failed to tolerate ace inhibitors (as is the case here)   dry powder inhalers(or even high dose ICS, as may be the case here)  or biphosphonates or report having atypical/extraesophageal reflux symptoms(eg LPR, likely here as well)  that don't respond to standard doses of PPI  and are easily confused as having aecopd or asthma flares by even experienced allergists/ pulmonologists (myself included).     Each maintenance medication was reviewed in detail including emphasizing most importantly the difference between maintenance and prns and under what circumstances the prns are to be triggered using an action plan format where appropriate.  Total time for H and P, chart review, counseling, reviewing hfa/ neb  device(s) and generating customized AVS unique to this office visit / same day charting = 43 min for multiple  refractory respiratory  symptoms of  uncertain etiology

## 2023-08-28 ENCOUNTER — Ambulatory Visit: Payer: Self-pay | Admitting: Family Medicine

## 2023-08-28 ENCOUNTER — Ambulatory Visit: Admitting: Internal Medicine

## 2023-09-02 ENCOUNTER — Encounter: Payer: Self-pay | Admitting: Family Medicine

## 2023-09-04 ENCOUNTER — Other Ambulatory Visit: Payer: Self-pay | Admitting: Family Medicine

## 2023-09-04 MED ORDER — CEPHALEXIN 500 MG PO CAPS: 500.0000 mg | ORAL_CAPSULE | Freq: Three times a day (TID) | ORAL | 0 refills | Status: AC

## 2023-09-04 NOTE — Telephone Encounter (Signed)
 Sent Cephalexin  (Keflex ): 500 mg PO QID for 7 days

## 2023-09-09 ENCOUNTER — Ambulatory Visit

## 2023-09-10 ENCOUNTER — Ambulatory Visit (HOSPITAL_COMMUNITY)

## 2023-09-14 ENCOUNTER — Ambulatory Visit: Attending: Cardiology | Admitting: *Deleted

## 2023-09-14 DIAGNOSIS — I480 Paroxysmal atrial fibrillation: Secondary | ICD-10-CM

## 2023-09-14 DIAGNOSIS — Z5181 Encounter for therapeutic drug level monitoring: Secondary | ICD-10-CM | POA: Diagnosis not present

## 2023-09-14 LAB — POCT INR: INR: 3.4 — AB (ref 2.0–3.0)

## 2023-09-14 NOTE — Patient Instructions (Signed)
 Hold warfarin tonight then resume 1 tablet daily except 1 1/2 tablets on Tuesdays, Thursdays and Saturdays Recheck INR in 4 weeks

## 2023-09-15 ENCOUNTER — Telehealth: Payer: Self-pay | Admitting: Cardiology

## 2023-09-15 DIAGNOSIS — Z79899 Other long term (current) drug therapy: Secondary | ICD-10-CM

## 2023-09-15 DIAGNOSIS — I10 Essential (primary) hypertension: Secondary | ICD-10-CM

## 2023-09-15 NOTE — Telephone Encounter (Signed)
 Returned call to pt- patient states she has some a lot of swelling her legs and feet. She believes she is holding onto quiet a bit of fluid. She also states she has been back in a. Fib for a while. She had pneumonia last month and has been swelling since then. She thinks her last normal dry weight is around 300-305 and her last weight about a week ago was 330. She states her legs are beginning to weep. She states she asked her PCP to give her an abx and she just finished those with little improvement. She does take take lasix  40mg  daily. Endorses missing some doses.   Shortness of breath is with activity. Getting worse as swelling gets worse.   Checked all pt offices with MD Skains and APP- no availability in appropriate time frame. Will route to provider for input.

## 2023-09-15 NOTE — Telephone Encounter (Signed)
 Pt c/o swelling/edema: STAT if pt has developed SOB within 24 hours  If swelling, where is the swelling located? Bi lat LE   How much weight have you gained and in what time span? unsure  Have you gained 2 pounds in a day or 5 pounds in a week? unsure  Do you have a log of your daily weights (if so, list)? No   Are you currently taking a fluid pill? yes  Are you currently SOB? When up moving around  Have you traveled recently in a car or plane for an extended period of time? No

## 2023-09-16 ENCOUNTER — Other Ambulatory Visit: Payer: Self-pay | Admitting: *Deleted

## 2023-09-16 DIAGNOSIS — I10 Essential (primary) hypertension: Secondary | ICD-10-CM

## 2023-09-16 DIAGNOSIS — Z79899 Other long term (current) drug therapy: Secondary | ICD-10-CM

## 2023-09-16 NOTE — Telephone Encounter (Signed)
 Hugh Madura, MD  You; Guss Legacy, RN10 minutes ago (10:23 AM)    Increase lasix  to 40mg  twice a day Repeat BMET in 2 weeks   Left message for pt of the above orders.  Requested she call back if she needs a new prescription for the Lasix  40 mg BID and which pharmacy she will need it send in to.  Order for repeat BMP in 2 weeks placed and released for [facility].

## 2023-09-21 ENCOUNTER — Ambulatory Visit: Payer: Self-pay

## 2023-09-21 NOTE — Telephone Encounter (Signed)
 FYI Only or Action Required?: FYI only for provider  Patient was last seen in primary care on 07/30/2023 by Rosanna Comment, FNP. Called Nurse Triage reporting Leg Swelling. And cough.  Symptoms began a week ago. Interventions attempted: Prescription medications: inhalers. Symptoms are: gradually worsening.  Triage Disposition: See Physician Within 24 Hours  Patient/caregiver understands and will follow disposition?: Yes  PT STATED SHE IS HAVING SOME CONGESTION COUGH STATED   Reason for Disposition  [1] Known COPD or other severe lung disease (i.e., bronchiectasis, cystic fibrosis, lung surgery) AND [2] worsening symptoms (i.e., increased sputum purulence or amount, increased breathing difficulty  Answer Assessment - Initial Assessment Questions 1. ONSET: When did the cough begin?      3-4 days 2. SEVERITY: How bad is the cough today?      Moderate-severe 3. SPUTUM: Describe the color of your sputum (none, dry cough; clear, white, yellow, green)     Thick and hard to cough up 4. HEMOPTYSIS: Are you coughing up any blood? If so ask: How much? (flecks, streaks, tablespoons, etc.)     denies 5. DIFFICULTY BREATHING: Are you having difficulty breathing? If Yes, ask: How bad is it? (e.g., mild, moderate, severe)    - MILD: No SOB at rest, mild SOB with walking, speaks normally in sentences, can lie down, no retractions, pulse < 100.    - MODERATE: SOB at rest, SOB with minimal exertion and prefers to sit, cannot lie down flat, speaks in phrases, mild retractions, audible wheezing, pulse 100-120.    - SEVERE: Very SOB at rest, speaks in single words, struggling to breathe, sitting hunched forward, retractions, pulse > 120      SOB with activity 6. FEVER: Do you have a fever? If Yes, ask: What is your temperature, how was it measured, and when did it start?     denies  8. LUNG HISTORY: Do you have any history of lung disease?  (e.g., pulmonary embolus, asthma,  emphysema)     Asthma, Sees Dr. Waymond Hailey pulmonolgy, trying to get in sooner  Answer Assessment - Initial Assessment Questions 1. ONSET: When did the swelling start? (e.g., minutes, hours, days)     ongoing 2. LOCATION: What part of the leg is swollen?  Are both legs swollen or just one leg?     Both legs, left worse than right 3. SEVERITY: How bad is the swelling? (e.g., localized; mild, moderate, severe)   - Localized: Small area of swelling localized to one leg.   - MILD pedal edema: Swelling limited to foot and ankle, pitting edema < 1/4 inch (6 mm) deep, rest and elevation eliminate most or all swelling.   - MODERATE edema: Swelling of lower leg to knee, pitting edema > 1/4 inch (6 mm) deep, rest and elevation only partially reduce swelling.   - SEVERE edema: Swelling extends above knee, facial or hand swelling present.      Swelling extends into thighs 4. REDNESS: Does the swelling look red or infected?     Redness/mottling to both legs from toes into both calves 5. PAIN: Is the swelling painful to touch? If Yes, ask: How painful is it?   (Scale 1-10; mild, moderate or severe)     Painful to walk 6. FEVER: Do you have a fever? If Yes, ask: What is it, how was it measured, and when did it start?      denies  8. MEDICAL HISTORY: Do you have a history of blood clots (e.g.,  DVT), cancer, heart failure, kidney disease, or liver failure?     denies 9. RECURRENT SYMPTOM: Have you had leg swelling before? If Yes, ask: When was the last time? What happened that time?     ongoing 10. OTHER SYMPTOMS: Do you have any other symptoms? (e.g., chest pain, difficulty breathing)       Blisters breaking open, has been given antibiotic, but it did not seem to help Reports that she has been using miracle balm but legs continue to swell  Protocols used: Leg Swelling and Edema-A-AH, Cough - Acute Productive-A-AH

## 2023-09-23 ENCOUNTER — Telehealth: Payer: Self-pay

## 2023-09-23 ENCOUNTER — Ambulatory Visit

## 2023-09-23 VITALS — HR 129 | Ht 64.0 in | Wt 340.0 lb

## 2023-09-23 DIAGNOSIS — M25562 Pain in left knee: Secondary | ICD-10-CM | POA: Diagnosis not present

## 2023-09-23 DIAGNOSIS — M25561 Pain in right knee: Secondary | ICD-10-CM | POA: Diagnosis not present

## 2023-09-23 DIAGNOSIS — L03115 Cellulitis of right lower limb: Secondary | ICD-10-CM | POA: Diagnosis not present

## 2023-09-23 DIAGNOSIS — G8929 Other chronic pain: Secondary | ICD-10-CM

## 2023-09-23 DIAGNOSIS — R052 Subacute cough: Secondary | ICD-10-CM

## 2023-09-23 DIAGNOSIS — L03116 Cellulitis of left lower limb: Secondary | ICD-10-CM | POA: Diagnosis not present

## 2023-09-23 MED ORDER — CEFDINIR 300 MG PO CAPS: 300.0000 mg | ORAL_CAPSULE | Freq: Two times a day (BID) | ORAL | 0 refills | Status: AC

## 2023-09-23 MED ORDER — HYDROCODONE-ACETAMINOPHEN 5-325 MG PO TABS: 1.0000 | ORAL_TABLET | Freq: Four times a day (QID) | ORAL | 0 refills | Status: AC | PRN

## 2023-09-23 NOTE — Progress Notes (Signed)
   Established Patient Office Visit  Subjective   Patient ID: Alexis [name], female    DOB: 07/24/1955  Age: 68 y.o. MRN: [id number]  Chief Complaint  Patient presents with   Medical Management of Chronic Issues    Pt states Bilateral leg swelling, left leg wheeping, and cough Also gained 10lbs since was here last with Dr.Wert thinks its flluid    HPI Edema: Patient complains of edema. The location of the edema is lower leg(s) bilateral.  The edema has been moderate.  Onset of symptoms was {numbers; 0-10:33138} {time units:11} ago, {clinical course - history:17::unchanged} since that time. The edema is present {timing:31009}. The patient states {previous history of symptoms:31189}.  The swelling has been aggravated by {edema aggravating factors:12415}, relieved by {edema relieving factors:12416}, and been associated with {edema associated signs and symptoms:12417}. Cardiac risk factors include {cv risk factors:510}.  {History (Optional):23778}  ROS    Objective:     Pulse (!) 129   Ht 5' 4 (1.626 m)   Wt (!) 340 lb 0.6 oz (154.2 kg)   SpO2 91%   BMI 58.37 kg/m  {Vitals History (Optional):23777}  Physical Exam   No results found for any visits on 09/23/23.  {Labs (Optional):23779}  The 10-year ASCVD risk score (Arnett DK, et al., 2019) is: 16.3%    Assessment & Plan:   Problem List Items Addressed This Visit   None   No follow-ups on file.    Alison Irvine, FNP

## 2023-09-23 NOTE — Progress Notes (Signed)
 Message received back from [facility]:  Patient no showed Ct appointment on 09/10/23. Patient has been rescheduled to 09/30/23. Calling to inform the patient

## 2023-09-23 NOTE — Telephone Encounter (Signed)
 Copied from CRM [id number]. Topic: Clinical - Prescription Issue >> Sep 23, 2023  3:02 PM Tiffany S wrote: Reason for CRM: HYDROcodone-acetaminophen (NORCO/VICODIN) 5-325 MG tablet [[id number]]  Pharmacist told patient there are several interaction with this medication please follow up with patient

## 2023-09-24 ENCOUNTER — Telehealth: Payer: Self-pay

## 2023-09-24 DIAGNOSIS — R052 Subacute cough: Secondary | ICD-10-CM | POA: Insufficient documentation

## 2023-09-24 NOTE — Telephone Encounter (Signed)
 Copied from CRM [id number]. Topic: Clinical - Prescription Issue >> Sep 24, 2023  2:02 PM Lysbeth Sauger wrote: Reason for CRM: pt needs to now what's going on with hydrocodone please follow up with PT

## 2023-09-24 NOTE — Assessment & Plan Note (Signed)
 Patient states chronic knee pain, has taken tramadol 50 mg with no relief. Agreed to refill hydrocodone for as needed pain relief.  Also recommend topical Voltaren gel. PDMP was reviewed at today's visit.

## 2023-09-24 NOTE — Assessment & Plan Note (Signed)
 Recurrent in nature.  Will treat based on erythema, warm and tender to touch,swelling noted Prescribed Omnicef  Home health referral placed for wound care given recurrent nature and patient's difficulty with ambulating.  She also does not drive and it is difficult for her to leave her house.

## 2023-09-24 NOTE — Assessment & Plan Note (Signed)
 She was recently diagnosed with pneumonia and treated.  She feels like symptoms are returning.  Will obtain chest x-ray for further evaluation.

## 2023-09-25 NOTE — Telephone Encounter (Signed)
 Awaiting Alexis [name] return

## 2023-09-29 ENCOUNTER — Other Ambulatory Visit: Payer: Self-pay | Admitting: Family Medicine

## 2023-09-30 ENCOUNTER — Ambulatory Visit (HOSPITAL_COMMUNITY)

## 2023-10-04 NOTE — Progress Notes (Unsigned)
 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 Last seen  on April 13, 2006 with the  recommendation that she maintain Symbicort  at 160/4.5 two puffs b.i.d.  Take empiric Protonix at 40 mg b.i.d. before meals, which she failed to  do, and try Singulair  10 mg q.p.m. She said that Singulair  did  nothing for her, and stopped it after a couple of weeks but is  convinced that Symbicort  is helping, and that she is using less  albuterol  than normal. It turns out, however, that she is still using  albuterol  4 or 5 times a day. She states she does not typically wake up  at night and need it.   Pulmonary function tests were reviewed from January 22, and indicate an  FEV1 of only 56% predicted with a ratio of 53% and a 15% improvement  after bronchodilators. rec gerd rx plus symbicort  160 2bid > all symptoms resolved once learned technique    08/24/2013 1st  office visit/ Annali Lybrand   Off gerd rx/ on symbicort  160 2bid and ACEi now Chief Complaint  Patient presents with   Advice Only    Old MW pt to reestablish care for Asthma, sleep study.   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 Why wakes 8 am not refreshed funny in the head and feels drowsy 10- noon. Only drives 10 min and on trips gets drowsy but husband always drives her.  rec Stop lisinopril  Start micardis  80 mg one half daily  You need to breath clean air to reduce your risk of asthma flares  Read for 30 min before bed nightly - if you do wake up no light We will schedule you for a sleep study in one month     08/01/2016  Extended f/u ov/Rola Lennon re:  dtc asthma on symb 160 2bid and avg saba once  daily  Chief Complaint  Patient presents with   Follow-up    4wk rov- pt states there was no improvment in breathing the first three weeks after last OV, pt had switched back to lisinopril  due to losartan  not controlling BP. pt reports occ non prod cough clear mucus & mild wheezing  thinks better not due to off acei but due to allergies  better since rained so changed back to acei instead of arb  Using saba q hs x early March 2018 which is a new issue for her and remains a problem, thinks may be due to noct HB rec Plan A = Automatic = Symbicort  80 Take 2 puffs first thing in am and then another 2 puffs about 12 hours later.  Plan B = Backup Only use your albuterol (ventolin )  Whenever cough/ wheeze for any reason or having heart burn >  Try prilosec otc 20mg   Take 30-60 min before first meal of the day and Pepcid  ac (famotidine ) 20 mg one @  bedtime until cough is completely gone for at least a week without the need for cough suppression GERD .  I f breathing/ wheezing / coughing getting worse on this regimen,  You  will either need to change back to an ARB (losartan  is the cheapest but may not be the best) and off lisinopril  or Let Dr Jeffrie or your Primary doctor refer you to another specialist for example an Allergist / asthma specialist in [facility] as there is nothing more I can do for you in this circumstance. Please schedule a follow up office visit in 4 weeks, sooner if needed with medication formulary > did not do   email 09/28/16: You were right about the Lisinopril  and for the most part I don't have to much congestion.  I still have some, but I still feel that symbicort  and albuterol  are handling it pretty good.  Not perfect, but pretty good.     11/13/2017  f/u ov/Khyre Germond re: asthma with component of uacs flared off gerd rx  Chief Complaint  Patient presents with   Acute Visit    She c/o prod cough for the past 6 wks- clear to light yellow sputum.    Dyspnea:  Limited by knees >  sob  Cough: worse x 2 weeks since stopped ppi / nexium   SABA use: hardly ever 02: no   Now living part time in [place]  taking care of her mother  Rec Resume nexium  20 mg Take 30-60 min before first meal of the day  GERD diet reviewed, bed blocks rec  If breathing cough or congestion worse > Prednisone  10 mg take  4 each am x 2 days,   2 each am x 2 days,  1 each am x 2 days and stop    02/16/2019 Phone visit recs (living in [place] )  Patient has MyChart but needs to change insurance to [place]  Pt would like [place] & [place] info  If get worse > Prednisone  10 mg take  4 each am x 2 days,   2 each am x 2 days,  1 each am x 2 days and stop  For drainage / throat tickle try take CHLORPHENIRAMINE  4 mg   Other option for nasal symptoms, coughing = trial of singulair  as don't see any evidence in Epic that it has been tried.     04/14/2023 Re-establish ov/[place] office/Amillion Macchia re: asthma/ uacs  maint on [place]ella due to insurance and not well controlled    Chief Complaint  Patient presents with   Establish Care   Asthma  Dyspnea:  knees give out before breathing / rides scooter to shop/ uses walker 2 wheels at home  Cough: none but subjective wheeze day > noct  Sleeping: bed is flat/ one pillow s    resp cc  SABA use: has hfa not using  02: none  Rec Plan A = Automatic = Always=    Symbicort  160 (Breyna  160) Take 2 puffs first thing in am and then another 2 puffs about 12 hours later.  Work on inhaler technique:    Plan B = Backup (to supplement plan A, not to replace it) Only use your albuterol  inhaler as a rescue medication  Plan C = Crisis (instead of Plan B but only if Plan B stops working) - only use your albuterol  nebulizer if you first try Plan B Depomedrol 120 mg IM   Please schedule a follow up visit in 3 months but call sooner if needed - bring inhalers   Onset cough march 2025 and ? Pna dx rx doxy /amox/ pred   08/25/2023  f/u ov/[place] office/Yarelly Kuba re: symbicort  160  maint on  symbicort  160  did not  bring inhalers  on prednisone  taper now at  10 mg x 3 each am   Chief Complaint  Patient presents with   Asthma  Dyspnea:  uses 2 wheeled walker at home but w/c to go out  Cough: not purulent  minimal vol  Sleeping: bed is flat/ 2 pillows with cough better with less saba noct  SABA use: neb before bed  02: none  Rec    10/06/2023  f/u ov/[place] office/Versa Craton re: *** maint on ***  No chief complaint on file.   Dyspnea:  *** Cough: *** Sleeping: ***   resp cc  SABA use: *** 02: ***  Lung cancer screening: ***   No obvious day to day or daytime variability or assoc excess/ purulent sputum or mucus plugs or hemoptysis or cp or chest tightness, subjective wheeze or overt sinus or hb symptoms.    Also denies any obvious fluctuation of symptoms with weather or environmental changes or other aggravating or alleviating factors except as outlined above   No unusual exposure hx or h/o childhood pna/ asthma or knowledge of premature birth.  Current Allergies, Complete Past Medical History, Past Surgical History, Family History, and Social History were reviewed in [employer] record.  ROS  The following are not active complaints unless bolded Hoarseness, sore throat, dysphagia, dental problems, itching, sneezing,  nasal congestion or discharge of excess mucus or purulent secretions, ear ache,   fever, chills, sweats, unintended wt loss or wt gain, classically pleuritic or exertional cp,  orthopnea pnd or arm/hand swelling  or leg swelling, presyncope, palpitations, abdominal pain, anorexia, nausea, vomiting, diarrhea  or change in bowel habits or change in bladder habits, change in stools or change in urine, dysuria, hematuria,  rash, arthralgias, visual complaints, headache, numbness, weakness or ataxia or problems with walking or coordination,  change in mood or  memory.        No outpatient medications have been marked as taking for the 10/06/23  encounter (Appointment) with Darlean Ozell NOVAK, MD.                Objective:   Physical Exam  08/25/2023       329  04/14/2023         324  (BMI 55)   11/13/2017         324  04/22/2017        324  11/07/2016          329  08/01/2016        335  07/01/2016        336   08/24/13 361 lb (163.749 kg)  08/19/13 359 lb (162.841 kg)  03/08/13 369 lb (167.377 kg)       Vital signs reviewed  10/06/2023  - Note at rest 02 sats  ***% on ***   General appearance:    ***     2+ pitting both LE and venous stasis dermatitis***      I personally reviewed images and  impression as follows:  CXR:   pa and lateral  08/21/23   ?LLL airspace dz but technically difficult du to body habitus.     Assessment & Plan:

## 2023-10-05 ENCOUNTER — Telehealth: Payer: Self-pay | Admitting: Cardiology

## 2023-10-05 NOTE — Telephone Encounter (Signed)
  Per MyChart scheduling message:   Initial complaint: Fluid buildup and to discuss A FIB. I also am experiencing neuropathy in my legs and feet. Recent blood work on record.   Patient c/o Palpitations: STAT if patient c/o lightheadedness, shortness of breath, or chest pain  How long have you had palpitations/irregular HR/ Afib? Are you having the symptoms now?  Are you currently experiencing lightheadedness, SOB or CP?  Do you have a history of afib (atrial fibrillation) or irregular heart rhythm?  Have you checked your BP or HR? (document readings if available):  Are you experiencing any other symptoms?    How long have you had palpitations/irregular HR/ Afib? Are you having the symptoms now?    Yes, I am having symptoms now.  I have been in AFIB since becoming Dr. Jeffrie patient again in 2024.   Are you currently experiencing lightheadedness, shortness of breath or chest pain?    Yes shortness of breath during activity.   Do you have a history of afib (atrial fibrillation) or irregular heart rhythm?    Yes see history in my records.   Have you checked your blood pressure or heart rate? (document readings if available):    Are you experiencing any other symptoms?     I had pneumonia all during the month of May.  I think, I probably still have it. I will go for a chest x-ray at [facility] in [place].

## 2023-10-05 NOTE — Telephone Encounter (Signed)
 Call to patient to discuss symptoms. No answer. Left message per DPR asking patient to keep appointment with Dr. Darlean tomorrow for pneumonia follow up. Advised that pneumonia could be contributing to afib exacerbation and to call our office at her earliest convenience to discuss afib symptoms so our office can set up a plan.

## 2023-10-05 NOTE — Telephone Encounter (Signed)
 Spoke with patient, states she would like to discuss fluid management with Dr Jeffrie (past due). Patient denies any increased SOB, has follow up appt with pulmonologist tomorrow, still recovering from pneumonia. Patient complains of bilateral leg swelling, doesn't measure weight at home but states edema has been consistent over the past several weeks despite lasix  use. Also would like updated labs but states her provider tomorrow may order them as well. Denies any concerns with her atrial fibrillation, states she's been taking her medication as prescribed. Scheduled a follow up visit with [name] on 10/21/23 (no appts with Dr Jeffrie available) No needs at this time, patient acknowledges she's past due in following up with her cardiologist.

## 2023-10-06 ENCOUNTER — Ambulatory Visit (INDEPENDENT_AMBULATORY_CARE_PROVIDER_SITE_OTHER): Admitting: Internal Medicine

## 2023-10-06 ENCOUNTER — Encounter: Payer: Self-pay | Admitting: Internal Medicine

## 2023-10-06 ENCOUNTER — Ambulatory Visit: Admitting: Internal Medicine

## 2023-10-06 ENCOUNTER — Telehealth: Payer: Self-pay

## 2023-10-06 ENCOUNTER — Ambulatory Visit (HOSPITAL_COMMUNITY)
Admission: RE | Admit: 2023-10-06 | Discharge: 2023-10-06 | Disposition: A | Discharge status: Home / Self Care | Source: Ambulatory Visit

## 2023-10-06 ENCOUNTER — Other Ambulatory Visit: Payer: Self-pay | Admitting: Cardiology

## 2023-10-06 VITALS — BP 126/89 | HR 118 | Ht 64.0 in | Wt 342.0 lb

## 2023-10-06 DIAGNOSIS — S80822D Blister (nonthermal), left lower leg, subsequent encounter: Secondary | ICD-10-CM | POA: Diagnosis not present

## 2023-10-06 DIAGNOSIS — Z7901 Long term (current) use of anticoagulants: Secondary | ICD-10-CM | POA: Diagnosis not present

## 2023-10-06 DIAGNOSIS — N183 Chronic kidney disease, stage 3 unspecified: Secondary | ICD-10-CM | POA: Diagnosis not present

## 2023-10-06 DIAGNOSIS — Z6841 Body Mass Index (BMI) 40.0 and over, adult: Secondary | ICD-10-CM | POA: Diagnosis not present

## 2023-10-06 DIAGNOSIS — I129 Hypertensive chronic kidney disease with stage 1 through stage 4 chronic kidney disease, or unspecified chronic kidney disease: Secondary | ICD-10-CM | POA: Diagnosis not present

## 2023-10-06 DIAGNOSIS — R052 Subacute cough: Secondary | ICD-10-CM | POA: Insufficient documentation

## 2023-10-06 DIAGNOSIS — R059 Cough, unspecified: Secondary | ICD-10-CM | POA: Diagnosis not present

## 2023-10-06 DIAGNOSIS — J4540 Moderate persistent asthma, uncomplicated: Secondary | ICD-10-CM

## 2023-10-06 DIAGNOSIS — L97221 Non-pressure chronic ulcer of left calf limited to breakdown of skin: Secondary | ICD-10-CM | POA: Diagnosis not present

## 2023-10-06 DIAGNOSIS — I480 Paroxysmal atrial fibrillation: Secondary | ICD-10-CM | POA: Diagnosis not present

## 2023-10-06 DIAGNOSIS — S80821D Blister (nonthermal), right lower leg, subsequent encounter: Secondary | ICD-10-CM | POA: Diagnosis not present

## 2023-10-06 DIAGNOSIS — L03116 Cellulitis of left lower limb: Secondary | ICD-10-CM | POA: Diagnosis not present

## 2023-10-06 DIAGNOSIS — M170 Bilateral primary osteoarthritis of knee: Secondary | ICD-10-CM | POA: Diagnosis not present

## 2023-10-06 DIAGNOSIS — Z556 Problems related to health literacy: Secondary | ICD-10-CM | POA: Diagnosis not present

## 2023-10-06 DIAGNOSIS — L03115 Cellulitis of right lower limb: Secondary | ICD-10-CM | POA: Diagnosis not present

## 2023-10-06 DIAGNOSIS — R0602 Shortness of breath: Secondary | ICD-10-CM | POA: Diagnosis not present

## 2023-10-06 DIAGNOSIS — J4530 Mild persistent asthma, uncomplicated: Secondary | ICD-10-CM | POA: Diagnosis not present

## 2023-10-06 DIAGNOSIS — E1122 Type 2 diabetes mellitus with diabetic chronic kidney disease: Secondary | ICD-10-CM | POA: Diagnosis not present

## 2023-10-06 DIAGNOSIS — I872 Venous insufficiency (chronic) (peripheral): Secondary | ICD-10-CM | POA: Diagnosis not present

## 2023-10-06 MED ORDER — RABEPRAZOLE SODIUM 20 MG PO TBEC
20.0000 mg | DELAYED_RELEASE_TABLET | Freq: Every day | ORAL | 2 refills | Status: DC
Start: 2023-10-06 — End: 2023-12-24

## 2023-10-06 NOTE — Telephone Encounter (Signed)
 Refill request for warfarin:  Last INR was 3.4 on 09/14/23 Next INR due 10/12/23 LOV was 01/21/23  Refill approved.

## 2023-10-06 NOTE — Telephone Encounter (Signed)
 Copied from CRM [id number]. Topic: Clinical - Medication Question >> Oct 05, 2023  4:27 PM Devaughn RAMAN wrote: Reason for CRM: Patient has an appointment on tomorrow and she called requesting samples of budesonide -formoterol  (SYMBICORT ) 80-4.5 MCG/ACT inhaler at her appointment tomorrow, patient stated she cannot afford the copay and she would like a sample of the medication.  ATC x1 left a detailed message to let patient know our office dosent carry Symbicort  samples.Patient does have a office visit today with Dr.Wert .

## 2023-10-06 NOTE — Assessment & Plan Note (Signed)
 Body mass index is 58.7 kg/m.  -  trending up again  Lab Results  Component Value Date   TSH 1.610 07/30/2023    Contributing to doe and risk of GERD/ dvt/ pe/  >>>   reviewed the need and the process to achieve and maintain neg calorie balance > defer f/u primary care including intermittently monitoring thyroid  status            Each maintenance medication was reviewed in detail including emphasizing most importantly the difference between maintenance and prns and under what circumstances the prns are to be triggered using an action plan format where appropriate.  Total time for H and P, chart review, counseling, reviewing neb  device(s) and generating customized AVS unique to this office visit / same day charting =   33 min for multiple  refractory respiratory  symptoms of uncertain etiology

## 2023-10-06 NOTE — Telephone Encounter (Signed)
 Alexis Harlene SAUNDERS, RN    10/05/23  5:23 PM Note Spoke with patient, states she would like to discuss fluid management with Dr Jeffrie (past due). Patient denies any increased SOB, has follow up appt with pulmonologist tomorrow, still recovering from pneumonia. Patient complains of bilateral leg swelling, doesn't measure weight at home but states edema has been consistent over the past several weeks despite lasix  use. Also would like updated labs but states her provider tomorrow may order them as well. Denies any concerns with her atrial fibrillation, states she's been taking her medication as prescribed. Scheduled a follow up visit with [name] on 10/21/23 (no appts with Dr Jeffrie available) No needs at this time, patient acknowledges she's past due in following up with her cardiologist.

## 2023-10-06 NOTE — Assessment & Plan Note (Signed)
 Onset childhood  - rec trial off acei 08/24/2013 and again 07/01/2016 x 3 weeks only - Spirometry 07/01/2016  FEV1 1.21 (47%)  Ratio 69 mild curvature  - FENO 07/01/2016  =   39  - Allergy  profile 08/01/2016 >  Eos 0.5 /  IgE  12 neg RAST  - 09/28/16 confirmed much better off acei (see email)  - 11/07/2016    try symbicort  80 2bid/ depomedrol 120 - 04/22/2017  After extensive coaching inhaler device  effectiveness =    75% > try symb 160 and avoid pred if possible / add gerd rx = aciphex  as can't tol protonix - 11/13/2017 flared off ppi > resume plus pred x 6 days if not improving on ppi  - 08/25/2023 trial of symbicort  80 2bid for uacs / cough variant asthma  (higher doses may be aggravating cough and contributing to risk of recurrent pna)   >>>>10/06/2023 changed to bud 0.25 bid with albuterol  2.5 mg bid and prn and leave off inhalers/ max gerd rx plus flutter valve to prevent cyclical coughing   Comment: Of the three most common causes of  Sub-acute / recurrent or chronic cough, only one (GERD)  can actually contribute to/ trigger  the other two (asthma and post nasal drip syndrome)  and perpetuate the cylce of cough.  While not intuitively obvious, many patients with chronic low grade reflux do not cough until there is a primary insult that disturbs the protective epithelial barrier and exposes sensitive nerve endings.   This is typically viral but can due to PNDS and  either may apply here.   The point is that once this occurs, it is difficult to eliminate the cycle  using anything but a maximally effective acid suppression regimen at least in the short run, accompanied by an appropriate diet to address non acid GERD and control / eliminate the cough itself with mucinex dm/ flutter valve but no more prednisone  needed at this point   F/u 4weeks with all meds in hand using a trust but verify approach to confirm accurate Medication  Reconciliation The principal here is that until we are certain that the   patients are doing what we've asked, it makes no sense to ask them to do more.

## 2023-10-06 NOTE — Patient Instructions (Addendum)
 For cough/ congestion >  mucinex dm up to maximum of  1200 mg every 12 hours and use the flutter valve as much as you can   Aciphex  20 mg Take 30-60 min before first meal of the day and pepcid  20 mg after supper until return   GERD (REFLUX)  is an extremely common cause of respiratory symptoms just like yours , many times with no obvious heartburn at all.    It can be treated with medication, but also with lifestyle changes including elevation of the head of your bed (ideally with 6 -8inch blocks under the headboard of your bed),  Smoking cessation, avoidance of late meals, excessive alcohol, and avoid fatty foods, chocolate, peppermint, colas, red wine, and acidic juices such as orange juice.  NO MINT OR MENTHOL PRODUCTS SO NO COUGH DROPS  USE SUGARLESS CANDY INSTEAD (Jolley ranchers or Stover's or Life Savers) or even ice chips will also do - the key is to swallow to prevent all throat clearing. NO OIL BASED VITAMINS - use powdered substitutes.  Avoid fish oil when coughing.  Try albuterol  2.5 mg with budesonide  0.25 mg mixed together twice daily   In between ok to use albuterol  up to every 4 hours if needed  Please schedule a follow up office visit in 4 weeks, sooner if needed  with all medications /inhalers/ solutions in hand so we can verify exactly what you are taking. This includes all medications from all doctors and over the counters

## 2023-10-08 DIAGNOSIS — L03116 Cellulitis of left lower limb: Secondary | ICD-10-CM | POA: Diagnosis not present

## 2023-10-08 DIAGNOSIS — S80822D Blister (nonthermal), left lower leg, subsequent encounter: Secondary | ICD-10-CM | POA: Diagnosis not present

## 2023-10-08 DIAGNOSIS — I872 Venous insufficiency (chronic) (peripheral): Secondary | ICD-10-CM | POA: Diagnosis not present

## 2023-10-08 DIAGNOSIS — L97221 Non-pressure chronic ulcer of left calf limited to breakdown of skin: Secondary | ICD-10-CM | POA: Diagnosis not present

## 2023-10-08 DIAGNOSIS — L03115 Cellulitis of right lower limb: Secondary | ICD-10-CM | POA: Diagnosis not present

## 2023-10-08 DIAGNOSIS — I480 Paroxysmal atrial fibrillation: Secondary | ICD-10-CM | POA: Diagnosis not present

## 2023-10-08 DIAGNOSIS — N183 Chronic kidney disease, stage 3 unspecified: Secondary | ICD-10-CM | POA: Diagnosis not present

## 2023-10-08 DIAGNOSIS — Z556 Problems related to health literacy: Secondary | ICD-10-CM | POA: Diagnosis not present

## 2023-10-08 DIAGNOSIS — Z7901 Long term (current) use of anticoagulants: Secondary | ICD-10-CM | POA: Diagnosis not present

## 2023-10-08 DIAGNOSIS — M170 Bilateral primary osteoarthritis of knee: Secondary | ICD-10-CM | POA: Diagnosis not present

## 2023-10-08 DIAGNOSIS — I129 Hypertensive chronic kidney disease with stage 1 through stage 4 chronic kidney disease, or unspecified chronic kidney disease: Secondary | ICD-10-CM | POA: Diagnosis not present

## 2023-10-08 DIAGNOSIS — S80821D Blister (nonthermal), right lower leg, subsequent encounter: Secondary | ICD-10-CM | POA: Diagnosis not present

## 2023-10-08 DIAGNOSIS — E1122 Type 2 diabetes mellitus with diabetic chronic kidney disease: Secondary | ICD-10-CM | POA: Diagnosis not present

## 2023-10-08 DIAGNOSIS — J4530 Mild persistent asthma, uncomplicated: Secondary | ICD-10-CM | POA: Diagnosis not present

## 2023-10-12 ENCOUNTER — Encounter: Payer: Self-pay | Admitting: Internal Medicine

## 2023-10-12 ENCOUNTER — Encounter: Payer: Self-pay | Admitting: Family Medicine

## 2023-10-12 ENCOUNTER — Ambulatory Visit: Attending: Cardiology | Admitting: *Deleted

## 2023-10-12 DIAGNOSIS — Z5181 Encounter for therapeutic drug level monitoring: Secondary | ICD-10-CM | POA: Diagnosis not present

## 2023-10-12 DIAGNOSIS — I480 Paroxysmal atrial fibrillation: Secondary | ICD-10-CM

## 2023-10-12 LAB — POCT INR: INR: 2.7 (ref 2.0–3.0)

## 2023-10-12 NOTE — Progress Notes (Signed)
Please see anticoagulation encounter.

## 2023-10-12 NOTE — Patient Instructions (Signed)
Continue warfarin 1 tablet daily except 1 1/2 tablets on Tuesdays, Thursdays and Saturdays Recheck INR in 4 weeks.

## 2023-10-13 ENCOUNTER — Other Ambulatory Visit: Payer: Self-pay | Admitting: Family Medicine

## 2023-10-13 DIAGNOSIS — L03115 Cellulitis of right lower limb: Secondary | ICD-10-CM | POA: Diagnosis not present

## 2023-10-13 DIAGNOSIS — Z556 Problems related to health literacy: Secondary | ICD-10-CM | POA: Diagnosis not present

## 2023-10-13 DIAGNOSIS — E1122 Type 2 diabetes mellitus with diabetic chronic kidney disease: Secondary | ICD-10-CM | POA: Diagnosis not present

## 2023-10-13 DIAGNOSIS — N183 Chronic kidney disease, stage 3 unspecified: Secondary | ICD-10-CM | POA: Diagnosis not present

## 2023-10-13 DIAGNOSIS — I872 Venous insufficiency (chronic) (peripheral): Secondary | ICD-10-CM | POA: Diagnosis not present

## 2023-10-13 DIAGNOSIS — M170 Bilateral primary osteoarthritis of knee: Secondary | ICD-10-CM | POA: Diagnosis not present

## 2023-10-13 DIAGNOSIS — S80822D Blister (nonthermal), left lower leg, subsequent encounter: Secondary | ICD-10-CM | POA: Diagnosis not present

## 2023-10-13 DIAGNOSIS — J4530 Mild persistent asthma, uncomplicated: Secondary | ICD-10-CM | POA: Diagnosis not present

## 2023-10-13 DIAGNOSIS — L97221 Non-pressure chronic ulcer of left calf limited to breakdown of skin: Secondary | ICD-10-CM | POA: Diagnosis not present

## 2023-10-13 DIAGNOSIS — I480 Paroxysmal atrial fibrillation: Secondary | ICD-10-CM | POA: Diagnosis not present

## 2023-10-13 DIAGNOSIS — L03116 Cellulitis of left lower limb: Secondary | ICD-10-CM | POA: Diagnosis not present

## 2023-10-13 DIAGNOSIS — S80821D Blister (nonthermal), right lower leg, subsequent encounter: Secondary | ICD-10-CM | POA: Diagnosis not present

## 2023-10-13 DIAGNOSIS — I129 Hypertensive chronic kidney disease with stage 1 through stage 4 chronic kidney disease, or unspecified chronic kidney disease: Secondary | ICD-10-CM | POA: Diagnosis not present

## 2023-10-13 DIAGNOSIS — Z7901 Long term (current) use of anticoagulants: Secondary | ICD-10-CM | POA: Diagnosis not present

## 2023-10-13 MED ORDER — FUROSEMIDE 40 MG PO TABS: 40.0000 mg | ORAL_TABLET | ORAL | 0 refills | Status: DC | PRN

## 2023-10-13 NOTE — Telephone Encounter (Unsigned)
 Copied from CRM [id number]. Topic: Clinical - Medication Refill >> Oct 13, 2023  5:37 PM Nathanel BROCKS wrote: Medication: furosemide  (LASIX ) 40 MG tablet  Has the patient contacted their pharmacy? Yes  This is the patient's preferred pharmacy:  [id number] - [place], [place] - [address] [place] [address] Phone: [phone] Fax: [phone]  Is this the correct pharmacy for this prescription? Yes If no, delete pharmacy and type the correct one.   Has the prescription been filled recently? Yes  Is the patient out of the medication? Yes  Has the patient been seen for an appointment in the last year OR does the patient have an upcoming appointment? Yes  Can we respond through MyChart? Yes  Agent: Please be advised that Rx refills may take up to 3 business days. We ask that you follow-up with your pharmacy.

## 2023-10-13 NOTE — Telephone Encounter (Signed)
 Please inform the patient that the Furosemide  refill has been sent. I am unable to check Digoxin  levels, as this monitoring should be managed by her cardiologist

## 2023-10-14 ENCOUNTER — Other Ambulatory Visit: Payer: Self-pay

## 2023-10-14 MED ORDER — BUDESONIDE 0.25 MG/2ML IN SUSP: 0.2500 mg | Freq: Every day | RESPIRATORY_TRACT | 12 refills | Status: AC

## 2023-10-14 MED ORDER — ALBUTEROL SULFATE (2.5 MG/3ML) 0.083% IN NEBU: 2.5000 mg | INHALATION_SOLUTION | Freq: Four times a day (QID) | RESPIRATORY_TRACT | 5 refills | Status: AC | PRN

## 2023-10-14 NOTE — Progress Notes (Signed)
 Rx sent per Dr. Darlean ronco note.

## 2023-10-14 NOTE — Telephone Encounter (Signed)
 Copied from CRM [id number]. Topic: Clinical - Prescription Issue >> Oct 13, 2023  5:46 PM Rilla B wrote: Reason for CRM: Patient calling to check status on budesonide  solution that Dr Darlean said she would be able to mix in with the albuterol .Patient sent MyChart message on yesterday and have not heard from the office.  Please call patient and update on the status.

## 2023-10-15 ENCOUNTER — Ambulatory Visit: Payer: Self-pay

## 2023-10-15 DIAGNOSIS — I480 Paroxysmal atrial fibrillation: Secondary | ICD-10-CM | POA: Diagnosis not present

## 2023-10-15 DIAGNOSIS — Z7901 Long term (current) use of anticoagulants: Secondary | ICD-10-CM | POA: Diagnosis not present

## 2023-10-15 DIAGNOSIS — L97221 Non-pressure chronic ulcer of left calf limited to breakdown of skin: Secondary | ICD-10-CM | POA: Diagnosis not present

## 2023-10-15 DIAGNOSIS — I872 Venous insufficiency (chronic) (peripheral): Secondary | ICD-10-CM | POA: Diagnosis not present

## 2023-10-15 DIAGNOSIS — J4530 Mild persistent asthma, uncomplicated: Secondary | ICD-10-CM | POA: Diagnosis not present

## 2023-10-15 DIAGNOSIS — L03116 Cellulitis of left lower limb: Secondary | ICD-10-CM | POA: Diagnosis not present

## 2023-10-15 DIAGNOSIS — I129 Hypertensive chronic kidney disease with stage 1 through stage 4 chronic kidney disease, or unspecified chronic kidney disease: Secondary | ICD-10-CM | POA: Diagnosis not present

## 2023-10-15 DIAGNOSIS — S80821D Blister (nonthermal), right lower leg, subsequent encounter: Secondary | ICD-10-CM | POA: Diagnosis not present

## 2023-10-15 DIAGNOSIS — E1122 Type 2 diabetes mellitus with diabetic chronic kidney disease: Secondary | ICD-10-CM | POA: Diagnosis not present

## 2023-10-15 DIAGNOSIS — L03115 Cellulitis of right lower limb: Secondary | ICD-10-CM | POA: Diagnosis not present

## 2023-10-15 DIAGNOSIS — Z556 Problems related to health literacy: Secondary | ICD-10-CM | POA: Diagnosis not present

## 2023-10-15 DIAGNOSIS — N183 Chronic kidney disease, stage 3 unspecified: Secondary | ICD-10-CM | POA: Diagnosis not present

## 2023-10-15 DIAGNOSIS — M170 Bilateral primary osteoarthritis of knee: Secondary | ICD-10-CM | POA: Diagnosis not present

## 2023-10-15 DIAGNOSIS — S80822D Blister (nonthermal), left lower leg, subsequent encounter: Secondary | ICD-10-CM | POA: Diagnosis not present

## 2023-10-16 DIAGNOSIS — Z556 Problems related to health literacy: Secondary | ICD-10-CM | POA: Diagnosis not present

## 2023-10-16 DIAGNOSIS — I480 Paroxysmal atrial fibrillation: Secondary | ICD-10-CM | POA: Diagnosis not present

## 2023-10-16 DIAGNOSIS — S80821D Blister (nonthermal), right lower leg, subsequent encounter: Secondary | ICD-10-CM | POA: Diagnosis not present

## 2023-10-16 DIAGNOSIS — N183 Chronic kidney disease, stage 3 unspecified: Secondary | ICD-10-CM | POA: Diagnosis not present

## 2023-10-16 DIAGNOSIS — S80822D Blister (nonthermal), left lower leg, subsequent encounter: Secondary | ICD-10-CM | POA: Diagnosis not present

## 2023-10-16 DIAGNOSIS — L03116 Cellulitis of left lower limb: Secondary | ICD-10-CM | POA: Diagnosis not present

## 2023-10-16 DIAGNOSIS — E1122 Type 2 diabetes mellitus with diabetic chronic kidney disease: Secondary | ICD-10-CM | POA: Diagnosis not present

## 2023-10-16 DIAGNOSIS — Z7901 Long term (current) use of anticoagulants: Secondary | ICD-10-CM | POA: Diagnosis not present

## 2023-10-16 DIAGNOSIS — J4530 Mild persistent asthma, uncomplicated: Secondary | ICD-10-CM | POA: Diagnosis not present

## 2023-10-16 DIAGNOSIS — M170 Bilateral primary osteoarthritis of knee: Secondary | ICD-10-CM | POA: Diagnosis not present

## 2023-10-16 DIAGNOSIS — I129 Hypertensive chronic kidney disease with stage 1 through stage 4 chronic kidney disease, or unspecified chronic kidney disease: Secondary | ICD-10-CM | POA: Diagnosis not present

## 2023-10-16 DIAGNOSIS — L97221 Non-pressure chronic ulcer of left calf limited to breakdown of skin: Secondary | ICD-10-CM | POA: Diagnosis not present

## 2023-10-16 DIAGNOSIS — L03115 Cellulitis of right lower limb: Secondary | ICD-10-CM | POA: Diagnosis not present

## 2023-10-16 DIAGNOSIS — I872 Venous insufficiency (chronic) (peripheral): Secondary | ICD-10-CM | POA: Diagnosis not present

## 2023-10-19 DIAGNOSIS — I129 Hypertensive chronic kidney disease with stage 1 through stage 4 chronic kidney disease, or unspecified chronic kidney disease: Secondary | ICD-10-CM | POA: Diagnosis not present

## 2023-10-19 DIAGNOSIS — L03116 Cellulitis of left lower limb: Secondary | ICD-10-CM | POA: Diagnosis not present

## 2023-10-19 DIAGNOSIS — S80821D Blister (nonthermal), right lower leg, subsequent encounter: Secondary | ICD-10-CM | POA: Diagnosis not present

## 2023-10-19 DIAGNOSIS — J4530 Mild persistent asthma, uncomplicated: Secondary | ICD-10-CM | POA: Diagnosis not present

## 2023-10-19 DIAGNOSIS — I480 Paroxysmal atrial fibrillation: Secondary | ICD-10-CM | POA: Diagnosis not present

## 2023-10-19 DIAGNOSIS — Z556 Problems related to health literacy: Secondary | ICD-10-CM | POA: Diagnosis not present

## 2023-10-19 DIAGNOSIS — E1122 Type 2 diabetes mellitus with diabetic chronic kidney disease: Secondary | ICD-10-CM | POA: Diagnosis not present

## 2023-10-19 DIAGNOSIS — Z7901 Long term (current) use of anticoagulants: Secondary | ICD-10-CM | POA: Diagnosis not present

## 2023-10-19 DIAGNOSIS — I872 Venous insufficiency (chronic) (peripheral): Secondary | ICD-10-CM | POA: Diagnosis not present

## 2023-10-19 DIAGNOSIS — L97221 Non-pressure chronic ulcer of left calf limited to breakdown of skin: Secondary | ICD-10-CM | POA: Diagnosis not present

## 2023-10-19 DIAGNOSIS — N183 Chronic kidney disease, stage 3 unspecified: Secondary | ICD-10-CM | POA: Diagnosis not present

## 2023-10-19 DIAGNOSIS — L03115 Cellulitis of right lower limb: Secondary | ICD-10-CM | POA: Diagnosis not present

## 2023-10-19 DIAGNOSIS — S80822D Blister (nonthermal), left lower leg, subsequent encounter: Secondary | ICD-10-CM | POA: Diagnosis not present

## 2023-10-19 DIAGNOSIS — M170 Bilateral primary osteoarthritis of knee: Secondary | ICD-10-CM | POA: Diagnosis not present

## 2023-10-20 DIAGNOSIS — L03116 Cellulitis of left lower limb: Secondary | ICD-10-CM | POA: Diagnosis not present

## 2023-10-20 DIAGNOSIS — L03115 Cellulitis of right lower limb: Secondary | ICD-10-CM | POA: Diagnosis not present

## 2023-10-20 DIAGNOSIS — I129 Hypertensive chronic kidney disease with stage 1 through stage 4 chronic kidney disease, or unspecified chronic kidney disease: Secondary | ICD-10-CM | POA: Diagnosis not present

## 2023-10-20 DIAGNOSIS — Z556 Problems related to health literacy: Secondary | ICD-10-CM | POA: Diagnosis not present

## 2023-10-20 DIAGNOSIS — N183 Chronic kidney disease, stage 3 unspecified: Secondary | ICD-10-CM | POA: Diagnosis not present

## 2023-10-20 DIAGNOSIS — I872 Venous insufficiency (chronic) (peripheral): Secondary | ICD-10-CM | POA: Diagnosis not present

## 2023-10-20 DIAGNOSIS — S80822D Blister (nonthermal), left lower leg, subsequent encounter: Secondary | ICD-10-CM | POA: Diagnosis not present

## 2023-10-20 DIAGNOSIS — Z7901 Long term (current) use of anticoagulants: Secondary | ICD-10-CM | POA: Diagnosis not present

## 2023-10-20 DIAGNOSIS — M170 Bilateral primary osteoarthritis of knee: Secondary | ICD-10-CM | POA: Diagnosis not present

## 2023-10-20 DIAGNOSIS — L97221 Non-pressure chronic ulcer of left calf limited to breakdown of skin: Secondary | ICD-10-CM | POA: Diagnosis not present

## 2023-10-20 DIAGNOSIS — I480 Paroxysmal atrial fibrillation: Secondary | ICD-10-CM | POA: Diagnosis not present

## 2023-10-20 DIAGNOSIS — S80821D Blister (nonthermal), right lower leg, subsequent encounter: Secondary | ICD-10-CM | POA: Diagnosis not present

## 2023-10-20 DIAGNOSIS — J4530 Mild persistent asthma, uncomplicated: Secondary | ICD-10-CM | POA: Diagnosis not present

## 2023-10-20 DIAGNOSIS — E1122 Type 2 diabetes mellitus with diabetic chronic kidney disease: Secondary | ICD-10-CM | POA: Diagnosis not present

## 2023-10-20 NOTE — Progress Notes (Deleted)
 Cardiology Office Note    Patient Name: Alexis [name] Date of Encounter: 10/20/2023  Primary Care Provider:  Terry Wilhelmena Lloyd Hilario, FNP Primary Cardiologist:  Oneil Parchment, MD Primary Electrophysiologist: OLE ONEIDA HOLTS, MD   Past Medical History    Past Medical History:  Diagnosis Date   Asthma 01/21/2013   Dr. Darlean   Endometrial cancer [facility]) 01/21/2013   Dr. Arlee, stage I-10/14   HTN (hypertension)    Morbid obesity (HCC)    Paroxysmal A-fib (HCC)    Uterine cancer (HCC)     History of Present Illness  Alexis [name] is a 68 y.o. female with a PMH of paroxysmal AF (on Coumadin ), HTN, endometrial cancer, uterine cancer, morbid obesity, asthma who presents today for annual follow-up.  [name] has been followed by Dr. Parchment since 2012 when she was diagnosed with AF with RVR.  She completed an adenosine stress test in 2007 that was negative.  She was placed on Pradaxa and then Xarelto  with bradycardia on beta-blocker which was reduced.  She was referred to the AF clinic and was seen by Arland Don, NP on 01/19/2017.  She wore a 30-day event monitor that showed no recurrence of some bradycardia.  She was continued on Cardizem  for rate control.  She moved to [place]  in 2019 to take care of her mother who was hospitalized with AF with RVR and underwent multiple DCCV with persistent AF noted.  She moved back to her after her mother passed away and had laparoscopic gastric sleeve completed in 2022.  she underwent pet/ct in 10/2019 in [place]  that was normal.  TEE in February 2023 showed EF 60-65% with moderate mitral regurgitation.  She was seen back in AF clinic on 11/2021 and was noted to be in AF and was continued on digoxin  and Cardizem  along with metoprolol .  She was seen by Dr. Parchment last on 01/03/2022 and recommended continuing rate control strategies with no surgical intervention or ablation at that time.  TTE was completed on 12/17/2021 with EF of 55 to 60% with  mild MR and trivial TR.  She was last seen in clinic by Orren Fabry, PA on 08/26/2022 and noted being in AF with rates as high as 150s but declined further interventions at that time.  She was having difficulty affording Eliquis  and discussed the Watchman but was not a candidate due to BMI and patient was switched to Coumadin .  She had digoxin  recently decreased due to elevated levels and completed updated dig level with decision to increase digoxin  at that time.  Contacted our office on 08/21/2023 with complaint of elevated BP and heart rate following bout of pneumonia and had Cardizem  increased to 360 mg daily.  She contacted our office on 09/15/2023 and reported lots of swelling in her feet and noted being back in atrial fibrillation.  She was noted to increase Lasix  to 40 mg twice daily with advisement to follow-up with cardiology for further evaluation.       Patient denies chest pain, palpitations, dyspnea, PND, orthopnea, nausea, vomiting, dizziness, syncope, edema, weight gain, or early satiety.   Discussed the use of AI scribe software for clinical note transcription with the patient, who gave verbal consent to proceed.  History of Present Illness    ***Notes:   Review of Systems  Please see the history of present illness.    All other systems reviewed and are otherwise negative except as noted above.  Physical Exam    Wt Readings from  Last 3 Encounters:  10/06/23 (!) 342 lb (155.1 kg)  09/23/23 (!) 340 lb 0.6 oz (154.2 kg)  08/25/23 (!) 329 lb (149.2 kg)   CD:Uyzmz were no vitals filed for this visit.,There is no height or weight on file to calculate BMI. GEN: Well nourished, well developed in no acute distress Neck: No JVD; No carotid bruits Pulmonary: Clear to auscultation without rales, wheezing or rhonchi  Cardiovascular: Normal rate. Regular rhythm. Normal S1. Normal S2.   Murmurs: There is no murmur.  ABDOMEN: Soft, non-tender, non-distended EXTREMITIES:  No edema;  No deformity   EKG/LABS/ Recent Cardiac Studies   ECG personally reviewed by me today - ***  Risk Assessment/Calculations:   {Does this patient have ATRIAL FIBRILLATION?:[id number]}      Lab Results  Component Value Date   WBC 7.8 07/30/2023   HGB 14.5 07/30/2023   HCT 43.6 07/30/2023   MCV 90 07/30/2023   PLT 257 07/30/2023   Lab Results  Component Value Date   CREATININE 0.89 08/21/2023   BUN 12 08/21/2023   NA 141 08/21/2023   K 4.1 08/21/2023   CL 101 08/21/2023   CO2 26 08/21/2023   Lab Results  Component Value Date   CHOL 158 07/30/2023   HDL 42 07/30/2023   LDLCALC 96 07/30/2023   TRIG 108 07/30/2023   CHOLHDL 3.8 07/30/2023    Lab Results  Component Value Date   HGBA1C 6.4 (H) 07/30/2023   Assessment & Plan    Assessment and Plan Assessment & Plan     1.  Persistent atrial fibrillation  2.  Lower extremity edema  3.  Essential hypertension  4.  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      Disposition: Follow-up with Oneil Parchment, MD or APP in *** months {Are you ordering a CV Procedure (e.g. stress test, cath, DCCV, TEE, etc)?   Press F2        :[id number]}   Signed, Wyn Raddle, Jackee Shove, NP 10/20/2023, 10:48 AM [facility]

## 2023-10-21 ENCOUNTER — Telehealth: Payer: Self-pay

## 2023-10-21 ENCOUNTER — Ambulatory Visit: Admitting: Nurse Practitioner

## 2023-10-21 ENCOUNTER — Telehealth: Payer: Self-pay | Admitting: *Deleted

## 2023-10-21 DIAGNOSIS — J4540 Moderate persistent asthma, uncomplicated: Secondary | ICD-10-CM

## 2023-10-21 DIAGNOSIS — I4819 Other persistent atrial fibrillation: Secondary | ICD-10-CM

## 2023-10-21 DIAGNOSIS — I340 Nonrheumatic mitral (valve) insufficiency: Secondary | ICD-10-CM

## 2023-10-21 DIAGNOSIS — R600 Localized edema: Secondary | ICD-10-CM

## 2023-10-21 DIAGNOSIS — I10 Essential (primary) hypertension: Secondary | ICD-10-CM

## 2023-10-21 MED ORDER — BREZTRI AEROSPHERE 160-9-4.8 MCG/ACT IN AERO: 2.0000 | INHALATION_SPRAY | Freq: Two times a day (BID) | RESPIRATORY_TRACT | Status: DC

## 2023-10-21 MED ORDER — BUDESONIDE-FORMOTEROL FUMARATE 80-4.5 MCG/ACT IN AERO: INHALATION_SPRAY | RESPIRATORY_TRACT | 12 refills | Status: AC

## 2023-10-21 NOTE — Telephone Encounter (Signed)
 Copied from CRM [id number]. Topic: Clinical - Medication Question >> Oct 21, 2023  9:57 AM Emylou G wrote: Reason for CRM: Patient called wanting [facility] - Ozell KATHEE America, MD but she knows he comes by [facility].. Would like samples: Brestri Inhaler.. Pls give her call?

## 2023-10-21 NOTE — Telephone Encounter (Signed)
 Copied from CRM [id number]. Topic: Clinical - Prescription Issue >> Oct 21, 2023 10:01 AM Isabell A wrote: Reason for CRM: Patient states budesonide  is not helping her, requesting samples of Bretzri or a prescription for [employer].   Callback number: [phone]   Called and spoke with the pt  She states that she has not been doing well with her breathing since last visit 10/06/23  She thinks this is bc she is not using symbicort  anymore and only uses budes/alb nebs bid  She also uses the albuterol  neb 4 x daily between her scheduled doses  She is wheezing and coughing more- clear to yellow sputum  She wants to go back to symbicort   Please advise, thanks!

## 2023-10-21 NOTE — Telephone Encounter (Signed)
 I called and spoke with the pt and notified of response per MW and rx for symbicort  was sent to her preferred pharm She states that she is needing sample of breztri  until she can afford the symbicort  rx  Dr. Darlean stated okay to leave her 2 boxes breztri   I left them at front desk at [facility] office and she is aware  Nothing further needed

## 2023-10-22 ENCOUNTER — Encounter: Payer: Self-pay | Admitting: Internal Medicine

## 2023-10-22 MED ORDER — PREDNISONE 10 MG PO TABS
ORAL_TABLET | ORAL | 0 refills | Status: DC
Start: 2023-10-22 — End: 2023-10-27

## 2023-10-22 NOTE — Telephone Encounter (Signed)
 Please see msgs and advise if you want to call her in pred that she is requesting:  Alexis [name]  P [facility] (supporting Ozell KATHEE America, MD)2 hours ago (1:31 PM)    Yes I have started on Breztri  and using the AlbuteroI nebulizer.  I am coughing and wheezing.  I am bringing up a little mucus at a time, but the cough is a deep, lingering one. I have had the cough since the beginning of May, when I had pneumonia.   No fever.  The coughing wears me out. The phlegm coming up is thick and a little gray in color.    You  Caliann Hardman3 hours ago (12:25 PM)    Did you get started on the breztri  samples yet? Are you wheezing? Coughing up any mucus? Fever?    Tabathia Shippey  P [facility] (supporting Ozell KATHEE America, MD)5 hours ago (10:38 AM)    Hello Dr America,   Could you please call me in a prescription for Prednisone ?  I need some help with this cough and congestion. [facility] [place].   Thank you, Alexis [name]

## 2023-10-23 NOTE — Progress Notes (Deleted)
  Cardiology Office Note:  .   Date:  10/23/2023  ID:  Alexis [name], DOB [birth year], MRN [id number] PCP: Alexis Wilhelmena Lloyd Hilario, FNP  [facility] Providers Cardiologist:  Alexis Parchment, MD Electrophysiologist:  Alexis ONEIDA HOLTS, MD { Click to update primary MD,subspecialty MD or APP then REFRESH:1}   History of Present Illness: .   Alexis [name] is a 68 y.o. female  with a PMH of paroxysmal AF (on Coumadin ), HTN, endometrial cancer, uterine cancer, morbid obesity, asthma.In 2022 she had bariatric surgery and successfully lost 100 pounds.  ROS: ***  Studies Reviewed: SABRA         Prior CV Studies: {Select studies to display:26339}  Echo 12/2021 IMPRESSIONS     1. Left ventricular ejection fraction, by estimation, is 55 to 60%. The  left ventricle has normal function. The left ventricle has no regional  wall motion abnormalities. Left ventricular diastolic parameters are  indeterminate.   2. Right ventricular systolic function is low normal. The right  ventricular size is normal. There is normal pulmonary artery systolic  pressure. The estimated right ventricular systolic pressure is 27.4 mmHg.   3. The mitral valve is grossly normal. Mild mitral valve regurgitation.   4. The aortic valve is tricuspid. Aortic valve regurgitation is not  visualized. Aortic valve sclerosis is present, with no evidence of aortic  valve stenosis. Aortic valve mean gradient measures 3.0 mmHg.   5. The inferior vena cava is dilated in size with <50% respiratory  variability, suggesting right atrial pressure of 15 mmHg.   Comparison(s): Prior images unable to be directly viewed.    Risk Assessment/Calculations:   {Does this patient have ATRIAL FIBRILLATION?:[phone]} No BP recorded.  {Refresh Note OR Click here to enter BP  :1}***       Physical Exam:   VS:  There were no vitals taken for this visit.   Orhtostatics: No data found. Wt Readings from Last 3 Encounters:  10/06/23 (!) 342 lb  (155.1 kg)  09/23/23 (!) 340 lb 0.6 oz (154.2 kg)  08/25/23 (!) 329 lb (149.2 kg)    GEN: Well nourished, well developed in no acute distress NECK: No JVD; No carotid bruits CARDIAC: ***RRR, no murmurs, rubs, gallops RESPIRATORY:  Clear to auscultation without rales, wheezing or rhonchi  ABDOMEN: Soft, non-tender, non-distended EXTREMITIES:  No edema; No deformity   ASSESSMENT AND PLAN: .    Paroxysmal atrial fibrillation -Atrial fibrillation with RVR today, rate 129 bpm -Obtain digoxin  level and increase if indicated -Continue diltiazem  and metoprolol  as well as Eliquis  -Once she runs out of her Eliquis  she is planning to switch to Coumadin , discussed continuing Eliquis  for 3 days while starting Coumadin  and then discontinuing after 3 days. -She was started on Coumadin  5 mg daily -She will need close follow-up, not interested in another DCCV or invasive procedures -goal is rate control -needs coumadin  clinic appointment but lives in [place], discussed needs an INR in 5-6 days after starting coumadin  -will order split night sleep study for excessive daytime sleepiness   Mitral valve insufficiency, unspecified etiology -Recent echocardiogram September 2023 showed only a mild leak of her mitral valve   HTN -Blood pressure well-controlled today 108/64 -continue current medications          {Are you ordering a CV Procedure (e.g. stress test, cath, DCCV, TEE, etc)?   Press F2        :[id number]}  Dispo: ***  Signed, Alexis Pavy, PA-C

## 2023-10-27 ENCOUNTER — Other Ambulatory Visit: Payer: Self-pay

## 2023-10-27 ENCOUNTER — Other Ambulatory Visit: Payer: Self-pay | Admitting: Family Medicine

## 2023-10-27 ENCOUNTER — Telehealth: Payer: Self-pay

## 2023-10-27 MED ORDER — BREZTRI AEROSPHERE 160-9-4.8 MCG/ACT IN AERO: 2.0000 | INHALATION_SPRAY | Freq: Two times a day (BID) | RESPIRATORY_TRACT | Status: AC

## 2023-10-27 MED ORDER — PREDNISONE 10 MG PO TABS
ORAL_TABLET | ORAL | 0 refills | Status: DC
Start: 2023-10-27 — End: 2024-01-06

## 2023-10-27 NOTE — Telephone Encounter (Signed)
 Spoke with pt and informed her that I sent the pred over to the pharm. NFN

## 2023-10-27 NOTE — Telephone Encounter (Signed)
 Dr Darlean pt is still experiencing issues she had from last OV would like Pred refill is that okay?  Called and spoke with about giving her a sample Breztri .

## 2023-10-27 NOTE — Telephone Encounter (Signed)
 Copied from CRM [id number]. Topic: Clinical - Prescription Issue >> Oct 26, 2023 10:22 AM [name] SAUNDERS wrote: Reason for CRM: Patient states Dr. Darlean prescribed her a 7 day taper of prednisone  and states it has helped but is requesting more and is also wondering if she can take mucinx with the prednisone . Please call patient back and advise.   Sent rx, see phone note

## 2023-10-28 DIAGNOSIS — S80821D Blister (nonthermal), right lower leg, subsequent encounter: Secondary | ICD-10-CM | POA: Diagnosis not present

## 2023-10-28 DIAGNOSIS — M170 Bilateral primary osteoarthritis of knee: Secondary | ICD-10-CM | POA: Diagnosis not present

## 2023-10-28 DIAGNOSIS — Z556 Problems related to health literacy: Secondary | ICD-10-CM | POA: Diagnosis not present

## 2023-10-28 DIAGNOSIS — E1122 Type 2 diabetes mellitus with diabetic chronic kidney disease: Secondary | ICD-10-CM | POA: Diagnosis not present

## 2023-10-28 DIAGNOSIS — L97221 Non-pressure chronic ulcer of left calf limited to breakdown of skin: Secondary | ICD-10-CM | POA: Diagnosis not present

## 2023-10-28 DIAGNOSIS — L03116 Cellulitis of left lower limb: Secondary | ICD-10-CM | POA: Diagnosis not present

## 2023-10-28 DIAGNOSIS — S80822D Blister (nonthermal), left lower leg, subsequent encounter: Secondary | ICD-10-CM | POA: Diagnosis not present

## 2023-10-28 DIAGNOSIS — I129 Hypertensive chronic kidney disease with stage 1 through stage 4 chronic kidney disease, or unspecified chronic kidney disease: Secondary | ICD-10-CM | POA: Diagnosis not present

## 2023-10-28 DIAGNOSIS — I872 Venous insufficiency (chronic) (peripheral): Secondary | ICD-10-CM | POA: Diagnosis not present

## 2023-10-28 DIAGNOSIS — Z7901 Long term (current) use of anticoagulants: Secondary | ICD-10-CM | POA: Diagnosis not present

## 2023-10-28 DIAGNOSIS — L03115 Cellulitis of right lower limb: Secondary | ICD-10-CM | POA: Diagnosis not present

## 2023-10-28 DIAGNOSIS — J4530 Mild persistent asthma, uncomplicated: Secondary | ICD-10-CM | POA: Diagnosis not present

## 2023-10-28 DIAGNOSIS — N183 Chronic kidney disease, stage 3 unspecified: Secondary | ICD-10-CM | POA: Diagnosis not present

## 2023-10-28 DIAGNOSIS — I480 Paroxysmal atrial fibrillation: Secondary | ICD-10-CM | POA: Diagnosis not present

## 2023-10-30 DIAGNOSIS — S80821D Blister (nonthermal), right lower leg, subsequent encounter: Secondary | ICD-10-CM | POA: Diagnosis not present

## 2023-10-30 DIAGNOSIS — E1122 Type 2 diabetes mellitus with diabetic chronic kidney disease: Secondary | ICD-10-CM | POA: Diagnosis not present

## 2023-10-30 DIAGNOSIS — S80822D Blister (nonthermal), left lower leg, subsequent encounter: Secondary | ICD-10-CM | POA: Diagnosis not present

## 2023-10-30 DIAGNOSIS — I129 Hypertensive chronic kidney disease with stage 1 through stage 4 chronic kidney disease, or unspecified chronic kidney disease: Secondary | ICD-10-CM | POA: Diagnosis not present

## 2023-10-30 DIAGNOSIS — L03116 Cellulitis of left lower limb: Secondary | ICD-10-CM | POA: Diagnosis not present

## 2023-10-30 DIAGNOSIS — M170 Bilateral primary osteoarthritis of knee: Secondary | ICD-10-CM | POA: Diagnosis not present

## 2023-10-30 DIAGNOSIS — L03115 Cellulitis of right lower limb: Secondary | ICD-10-CM | POA: Diagnosis not present

## 2023-10-30 DIAGNOSIS — J4530 Mild persistent asthma, uncomplicated: Secondary | ICD-10-CM | POA: Diagnosis not present

## 2023-10-30 DIAGNOSIS — Z556 Problems related to health literacy: Secondary | ICD-10-CM | POA: Diagnosis not present

## 2023-10-30 DIAGNOSIS — L97221 Non-pressure chronic ulcer of left calf limited to breakdown of skin: Secondary | ICD-10-CM | POA: Diagnosis not present

## 2023-10-30 DIAGNOSIS — N183 Chronic kidney disease, stage 3 unspecified: Secondary | ICD-10-CM | POA: Diagnosis not present

## 2023-10-30 DIAGNOSIS — I480 Paroxysmal atrial fibrillation: Secondary | ICD-10-CM | POA: Diagnosis not present

## 2023-10-30 DIAGNOSIS — I872 Venous insufficiency (chronic) (peripheral): Secondary | ICD-10-CM | POA: Diagnosis not present

## 2023-10-30 DIAGNOSIS — Z7901 Long term (current) use of anticoagulants: Secondary | ICD-10-CM | POA: Diagnosis not present

## 2023-11-01 ENCOUNTER — Other Ambulatory Visit: Payer: Self-pay | Admitting: Family Medicine

## 2023-11-02 ENCOUNTER — Other Ambulatory Visit: Payer: Self-pay | Admitting: Cardiology

## 2023-11-02 ENCOUNTER — Other Ambulatory Visit: Payer: Self-pay | Admitting: Family Medicine

## 2023-11-03 ENCOUNTER — Ambulatory Visit: Attending: Physician Assistant | Admitting: Physician Assistant

## 2023-11-03 NOTE — Progress Notes (Deleted)
 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 Last seen  on April 13, 2006 with the  recommendation that she maintain Symbicort  at 160/4.5 two puffs b.i.d.  Take empiric Protonix at 40 mg b.i.d. before meals, which she failed to  do, and try Singulair  10 mg q.p.m. She said that Singulair  did  nothing for her, and stopped it after a couple of weeks but is  convinced that Symbicort  is helping, and that she is using less  albuterol  than normal. It turns out, however, that she is still using  albuterol  4 or 5 times a day. She states she does not typically wake up  at night and need it.   Pulmonary function tests were reviewed from January 22, and indicate an  FEV1 of only 56% predicted with a ratio of 53% and a 15% improvement  after bronchodilators. rec gerd rx plus symbicort  160 2bid > all symptoms resolved once learned technique    08/24/2013 1st  office visit/ Kalei Mckillop   Off gerd rx/ on symbicort  160 2bid and ACEi now Chief Complaint  Patient presents with   Advice Only    Old MW pt to reestablish care for Asthma, sleep study.   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 Why wakes 8 am not refreshed funny in the head and feels drowsy 10- noon. Only drives 10 min and on trips gets drowsy but husband always drives her.  rec Stop lisinopril  Start micardis  80 mg one half daily  You need to breath clean air to reduce your risk of asthma flares  Read for 30 min before bed nightly - if you do wake up no light We will schedule you for a sleep study in one month     08/01/2016  Extended f/u ov/Brittnae Aschenbrenner re:  dtc asthma on symb 160 2bid and avg saba once  daily  Chief Complaint  Patient presents with   Follow-up    4wk rov- pt states there was no improvment in breathing the first three weeks after last OV, pt had switched back to lisinopril  due to losartan  not controlling BP. pt reports occ non prod cough clear mucus & mild wheezing  thinks better not due to off acei but due to allergies  better since rained so changed back to acei instead of arb  Using saba q hs x early March 2018 which is a new issue for her and remains a problem, thinks may be due to noct HB rec Plan A = Automatic = Symbicort  80 Take 2 puffs first thing in am and then another 2 puffs about 12 hours later.  Plan B = Backup Only use your albuterol (ventolin )  Whenever cough/ wheeze for any reason or having heart burn >  Try prilosec otc 20mg   Take 30-60 min before first meal of the day and Pepcid  ac (famotidine ) 20 mg one @  bedtime until cough is completely gone for at least a week without the need for cough suppression GERD .  I f breathing/ wheezing / coughing getting worse on this regimen,  You  will either need to change back to an ARB (losartan  is the cheapest but may not be the best) and off lisinopril  or Let Dr Jeffrie or your Primary doctor refer you to another specialist for example an Allergist / asthma specialist in [facility] as there is nothing more I can do for you in this circumstance. Please schedule a follow up office visit in 4 weeks, sooner if needed with medication formulary > did not do   email 09/28/16: You were right about the Lisinopril  and for the most part I don't have to much congestion.  I still have some, but I still feel that symbicort  and albuterol  are handling it pretty good.  Not perfect, but pretty good.     11/13/2017  f/u ov/Laverta Harnisch re: asthma with component of uacs flared off gerd rx  Chief Complaint  Patient presents with   Acute Visit    She c/o prod cough for the past 6 wks- clear to light yellow sputum.    Dyspnea:  Limited by knees >  sob  Cough: worse x 2 weeks since stopped ppi / nexium   SABA use: hardly ever 02: no   Now living part time in [place]  taking care of her mother  Rec Resume nexium  20 mg Take 30-60 min before first meal of the day  GERD diet reviewed, bed blocks rec  If breathing cough or congestion worse > Prednisone  10 mg take  4 each am x 2 days,   2 each am x 2 days,  1 each am x 2 days and stop    02/16/2019 Phone visit recs (living in [place] )  Patient has MyChart but needs to change insurance to [place]  Pt would like [place] & [place] info  If get worse > Prednisone  10 mg take  4 each am x 2 days,   2 each am x 2 days,  1 each am x 2 days and stop  For drainage / throat tickle try take CHLORPHENIRAMINE  4 mg   Other option for nasal symptoms, coughing = trial of singulair  as don't see any evidence in Epic that it has been tried.     04/14/2023 Re-establish ov/[place] office/Sophea Rackham re: asthma/ uacs  maint on [place] due to insurance and not well controlled    Chief Complaint  Patient presents with   Establish Care   Asthma  Dyspnea:  knees give out before breathing / rides scooter to shop/ uses walker 2 wheels at home  Cough: none but subjective wheeze day > noct  Sleeping: bed is flat/ one pillow s    resp cc  SABA use: has hfa not using  02: none  Rec Plan A = Automatic = Always=    Symbicort  160 (Breyna  160) Take 2 puffs first thing in am and then another 2 puffs about 12 hours later.  Work on inhaler technique:    Plan B = Backup (to supplement plan A, not to replace it) Only use your albuterol  inhaler as a rescue medication  Plan C = Crisis (instead of Plan B but only if Plan B stops working) - only use your albuterol  nebulizer if you first try Plan B Depomedrol 120 mg IM   Please schedule a follow up visit in 3 months but call sooner if needed - bring inhalers   Onset cough march 2025 and ? Pna dx rx doxy /amox/ pred    08/25/2023  f/u ov/[place] office/Emit Kuenzel re: symbicort  160  maint on  symbicort  160  did  not  bring inhalers  on prednisone  taper now at  10 mg x 3 each am   Chief Complaint  Patient presents with   Asthma  Dyspnea:  uses 2 wheeled walker at home but w/c to go out  Cough: not purulent  minimal vol  Sleeping: bed is flat/ 2 pillows with cough better with less saba noct  SABA use: neb before bed  02: none  Rec Plan A = Automatic = Always=    Symbicort  80 Take 2 puffs first thing in am and then another 2 puffs about 12 hours later.   Plan B = Backup (to supplement plan A, not to replace it) Only use your albuterol  inhaler as a rescue medication Plan C = Crisis (instead of Plan B but only if Plan B stops working) - only use your albuterol  nebulizer if you first try Plan B Finish prednisone   For cough/ congestion >  mucinex dm up to maximum of  1200 mg every 12 hours and use the flutter valve as much as you can   GERD diet reviewed, bed blocks rec   Please schedule a follow up office visit in 6 weeks, call sooner if needed with inhalers    10/06/2023  f/u ov/[place] office/Kaci Dillie re: asthma  cannot afford symbicort  / did not bring any meds  Chief Complaint  Patient presents with   Follow-up   Asthma  Dyspnea:  2 wheeled walker in house, use w/c to go out  Cough: mucus is grey and cough is much worse since May 2025 = harsh barking  Sleeping: bed is flat/ 2 pillows s    resp cc  SABA use: not using per abc plan nor using flutter mucinex or ppi  Rec For cough/ congestion >  mucinex dm up to maximum of  1200 mg every 12 hours and use the flutter valve as much as you can  Aciphex  20 mg Take 30-60 min before first meal of the day and pepcid  20 mg after supper until return  GERD diet reviewed, bed blocks rec  Try albuterol  2.5 mg with budesonide  0.25 mg mixed together twice daily  In between ok to use albuterol  up to every 4 hours if needed Please schedule a follow up office visit in 4 weeks, sooner if needed  with all medications /inhalers/ solutions in  hand    11/04/2023  f/u ov/[place] office/Anquan Azzarello re: *** maint on *** did *** bring meds No chief complaint on file.   Dyspnea:  *** Cough: *** Sleeping: ***   resp cc  SABA use: *** 02: ***  Lung cancer screening: ***   No obvious day to day or daytime variability or assoc excess/ purulent sputum or mucus plugs or hemoptysis or cp or chest tightness, subjective wheeze or overt sinus or hb symptoms.    Also denies any obvious fluctuation of symptoms with weather or environmental changes or other aggravating or alleviating factors except as outlined above   No unusual exposure hx or h/o childhood pna/ asthma or knowledge of premature birth.  Current Allergies, Complete Past Medical History, Past Surgical History, Family History, and Social History were reviewed in [employer] record.  ROS  The following are not active complaints unless bolded Hoarseness, sore throat, dysphagia, dental problems, itching, sneezing,  nasal congestion or discharge of excess mucus or purulent secretions, ear ache,   fever, chills, sweats, unintended wt loss or wt gain, classically pleuritic or exertional cp,  orthopnea pnd or arm/hand swelling  or leg swelling, presyncope, palpitations, abdominal pain, anorexia, nausea, vomiting, diarrhea  or change in bowel habits or change in bladder habits, change in stools or change in urine, dysuria, hematuria,  rash, arthralgias, visual complaints, headache, numbness, weakness or ataxia or problems with walking or coordination,  change in mood or  memory.        No outpatient medications have been marked as taking for the 11/04/23 encounter (Appointment) with Darlean Ozell NOVAK, MD.                    Objective:   Physical Exam  11/04/2023       ***  08/25/2023       329  04/14/2023         324  (BMI 55)   11/13/2017         324  04/22/2017        324  11/07/2016          329  08/01/2016        335  07/01/2016        336   08/24/13 361 lb  (163.749 kg)  08/19/13 359 lb (162.841 kg)  03/08/13 369 lb (167.377 kg)     Vital signs reviewed  11/04/2023  - Note at rest 02 sats  ***% on ***   General appearance:    ***        bilteral lymphedema    LE venous statis changes bilaterally  ***    Assessment & Plan:

## 2023-11-03 NOTE — Telephone Encounter (Signed)
Forward to Cardiology

## 2023-11-04 ENCOUNTER — Ambulatory Visit: Admitting: Internal Medicine

## 2023-11-05 DIAGNOSIS — L97829 Non-pressure chronic ulcer of other part of left lower leg with unspecified severity: Secondary | ICD-10-CM | POA: Diagnosis not present

## 2023-11-05 DIAGNOSIS — L97819 Non-pressure chronic ulcer of other part of right lower leg with unspecified severity: Secondary | ICD-10-CM | POA: Diagnosis not present

## 2023-11-05 DIAGNOSIS — L97222 Non-pressure chronic ulcer of left calf with fat layer exposed: Secondary | ICD-10-CM | POA: Diagnosis not present

## 2023-11-05 DIAGNOSIS — I87312 Chronic venous hypertension (idiopathic) with ulcer of left lower extremity: Secondary | ICD-10-CM | POA: Diagnosis not present

## 2023-11-09 ENCOUNTER — Ambulatory Visit

## 2023-11-09 DIAGNOSIS — I872 Venous insufficiency (chronic) (peripheral): Secondary | ICD-10-CM | POA: Diagnosis not present

## 2023-11-09 DIAGNOSIS — N183 Chronic kidney disease, stage 3 unspecified: Secondary | ICD-10-CM | POA: Diagnosis not present

## 2023-11-09 DIAGNOSIS — S80822D Blister (nonthermal), left lower leg, subsequent encounter: Secondary | ICD-10-CM | POA: Diagnosis not present

## 2023-11-09 DIAGNOSIS — Z556 Problems related to health literacy: Secondary | ICD-10-CM | POA: Diagnosis not present

## 2023-11-09 DIAGNOSIS — E1122 Type 2 diabetes mellitus with diabetic chronic kidney disease: Secondary | ICD-10-CM | POA: Diagnosis not present

## 2023-11-09 DIAGNOSIS — L97221 Non-pressure chronic ulcer of left calf limited to breakdown of skin: Secondary | ICD-10-CM | POA: Diagnosis not present

## 2023-11-09 DIAGNOSIS — J4530 Mild persistent asthma, uncomplicated: Secondary | ICD-10-CM | POA: Diagnosis not present

## 2023-11-09 DIAGNOSIS — S80821D Blister (nonthermal), right lower leg, subsequent encounter: Secondary | ICD-10-CM | POA: Diagnosis not present

## 2023-11-09 DIAGNOSIS — I480 Paroxysmal atrial fibrillation: Secondary | ICD-10-CM | POA: Diagnosis not present

## 2023-11-09 DIAGNOSIS — I129 Hypertensive chronic kidney disease with stage 1 through stage 4 chronic kidney disease, or unspecified chronic kidney disease: Secondary | ICD-10-CM | POA: Diagnosis not present

## 2023-11-09 DIAGNOSIS — Z7901 Long term (current) use of anticoagulants: Secondary | ICD-10-CM | POA: Diagnosis not present

## 2023-11-09 DIAGNOSIS — L03115 Cellulitis of right lower limb: Secondary | ICD-10-CM | POA: Diagnosis not present

## 2023-11-09 DIAGNOSIS — L03116 Cellulitis of left lower limb: Secondary | ICD-10-CM | POA: Diagnosis not present

## 2023-11-09 DIAGNOSIS — M170 Bilateral primary osteoarthritis of knee: Secondary | ICD-10-CM | POA: Diagnosis not present

## 2023-11-11 ENCOUNTER — Ambulatory Visit: Attending: Cardiology | Admitting: *Deleted

## 2023-11-11 DIAGNOSIS — Z5181 Encounter for therapeutic drug level monitoring: Secondary | ICD-10-CM | POA: Diagnosis not present

## 2023-11-11 DIAGNOSIS — I480 Paroxysmal atrial fibrillation: Secondary | ICD-10-CM

## 2023-11-11 LAB — POCT INR: INR: 5.4 — AB (ref 2.0–3.0)

## 2023-11-11 NOTE — Patient Instructions (Signed)
 Hold warfarin x 3 days then resume 1 tablet daily except 1 1/2 tablets on Tuesdays, Thursdays and Saturdays Been on Prednisone  Recheck INR in 2 weeks

## 2023-11-11 NOTE — Progress Notes (Signed)
 INR 5.4 Please see anticoagulation encounter

## 2023-11-16 ENCOUNTER — Ambulatory Visit: Payer: Self-pay

## 2023-11-16 DIAGNOSIS — Z7901 Long term (current) use of anticoagulants: Secondary | ICD-10-CM | POA: Diagnosis not present

## 2023-11-16 DIAGNOSIS — S80822D Blister (nonthermal), left lower leg, subsequent encounter: Secondary | ICD-10-CM | POA: Diagnosis not present

## 2023-11-16 DIAGNOSIS — J4530 Mild persistent asthma, uncomplicated: Secondary | ICD-10-CM | POA: Diagnosis not present

## 2023-11-16 DIAGNOSIS — I129 Hypertensive chronic kidney disease with stage 1 through stage 4 chronic kidney disease, or unspecified chronic kidney disease: Secondary | ICD-10-CM | POA: Diagnosis not present

## 2023-11-16 DIAGNOSIS — N183 Chronic kidney disease, stage 3 unspecified: Secondary | ICD-10-CM | POA: Diagnosis not present

## 2023-11-16 DIAGNOSIS — L03115 Cellulitis of right lower limb: Secondary | ICD-10-CM | POA: Diagnosis not present

## 2023-11-16 DIAGNOSIS — S80821D Blister (nonthermal), right lower leg, subsequent encounter: Secondary | ICD-10-CM | POA: Diagnosis not present

## 2023-11-16 DIAGNOSIS — L03116 Cellulitis of left lower limb: Secondary | ICD-10-CM | POA: Diagnosis not present

## 2023-11-16 DIAGNOSIS — M170 Bilateral primary osteoarthritis of knee: Secondary | ICD-10-CM | POA: Diagnosis not present

## 2023-11-16 DIAGNOSIS — I480 Paroxysmal atrial fibrillation: Secondary | ICD-10-CM | POA: Diagnosis not present

## 2023-11-16 DIAGNOSIS — I872 Venous insufficiency (chronic) (peripheral): Secondary | ICD-10-CM | POA: Diagnosis not present

## 2023-11-16 DIAGNOSIS — Z556 Problems related to health literacy: Secondary | ICD-10-CM | POA: Diagnosis not present

## 2023-11-16 DIAGNOSIS — L97221 Non-pressure chronic ulcer of left calf limited to breakdown of skin: Secondary | ICD-10-CM | POA: Diagnosis not present

## 2023-11-16 DIAGNOSIS — E1122 Type 2 diabetes mellitus with diabetic chronic kidney disease: Secondary | ICD-10-CM | POA: Diagnosis not present

## 2023-11-16 NOTE — Telephone Encounter (Signed)
 Summary: rx concern / rx question   The patient would like to be contacted by a member of clinical staff to discuss potential drug interactions. The patient shares that they have been alerted by their pharmacist to some potentially concerning interactions between their current medications and prescription for a painkiller the patient has not taken yet, but would like to. Please contact the patient further when possible

## 2023-11-17 ENCOUNTER — Telehealth: Payer: Self-pay

## 2023-11-17 NOTE — Telephone Encounter (Signed)
 Copied from CRM [id number]. Topic: Clinical - Prescription Issue >> Nov 16, 2023  5:56 PM Delon HERO wrote: Reason for CRM: Patient is calling back to discuss medication interaction. Patient reporting that she will call back tomorrow with the names of medications. Upset that she did not receive a call back. Please advise

## 2023-11-18 NOTE — Telephone Encounter (Signed)
 Spoke to patient, pharmacist told patient it would be dangerous to take hydrocodone  and diltiazem  together , patient is okay, she has already started taking the medication

## 2023-11-18 NOTE — Telephone Encounter (Signed)
 Duplicate encounter

## 2023-11-19 ENCOUNTER — Ambulatory Visit: Admitting: Internal Medicine

## 2023-11-19 ENCOUNTER — Encounter: Payer: Self-pay | Admitting: Internal Medicine

## 2023-11-19 DIAGNOSIS — J4540 Moderate persistent asthma, uncomplicated: Secondary | ICD-10-CM

## 2023-11-19 NOTE — Progress Notes (Deleted)
 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 Last seen  on April 13, 2006 with the  recommendation that she maintain Symbicort  at 160/4.5 two puffs b.i.d.  Take empiric Protonix at 40 mg b.i.d. before meals, which she failed to  do, and try Singulair  10 mg q.p.m. She said that Singulair  did  nothing for her, and stopped it after a couple of weeks but is  convinced that Symbicort  is helping, and that she is using less  albuterol  than normal. It turns out, however, that she is still using  albuterol  4 or 5 times a day. She states she does not typically wake up  at night and need it.   Pulmonary function tests were reviewed from January 22, and indicate an  FEV1 of only 56% predicted with a ratio of 53% and a 15% improvement  after bronchodilators. rec gerd rx plus symbicort  160 2bid > all symptoms resolved once learned technique    08/24/2013 1st  office visit/ Shewanda Sharpe   Off gerd rx/ on symbicort  160 2bid and ACEi now Chief Complaint  Patient presents with   Advice Only    Old MW pt to reestablish care for Asthma, sleep study.   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 Why wakes 8 am not refreshed funny in the head and feels drowsy 10- noon. Only drives 10 min and on trips gets drowsy but husband always drives her.  rec Stop lisinopril  Start micardis  80 mg one half daily  You need to breath clean air to reduce your risk of asthma flares  Read for 30 min before bed nightly - if you do wake up no light We will schedule you for a sleep study in one month     08/01/2016  Extended f/u ov/Aubryanna Nesheim re:  dtc asthma on symb 160 2bid and avg saba once  daily  Chief Complaint  Patient presents with   Follow-up    4wk rov- pt states there was no improvment in breathing the first three weeks after last OV, pt had switched back to lisinopril  due to losartan  not controlling BP. pt reports occ non prod cough clear mucus & mild wheezing  thinks better not due to off acei but due to allergies  better since rained so changed back to acei instead of arb  Using saba q hs x early March 2018 which is a new issue for her and remains a problem, thinks may be due to noct HB rec Plan A = Automatic = Symbicort  80 Take 2 puffs first thing in am and then another 2 puffs about 12 hours later.  Plan B = Backup Only use your albuterol (ventolin )  Whenever cough/ wheeze for any reason or having heart burn >  Try prilosec otc 20mg   Take 30-60 min before first meal of the day and Pepcid  ac (famotidine ) 20 mg one @  bedtime until cough is completely gone for at least a week without the need for cough suppression GERD .  I f breathing/ wheezing / coughing getting worse on this regimen,  You  will either need to change back to an ARB (losartan  is the cheapest but may not be the best) and off lisinopril  or Let Dr Jeffrie or your Primary doctor refer you to another specialist for example an Allergist / asthma specialist in [facility] as there is nothing more I can do for you in this circumstance. Please schedule a follow up office visit in 4 weeks, sooner if needed with medication formulary > did not do   email 09/28/16: You were right about the Lisinopril  and for the most part I don't have to much congestion.  I still have some, but I still feel that symbicort  and albuterol  are handling it pretty good.  Not perfect, but pretty good.     11/13/2017  f/u ov/Natsumi Whitsitt re: asthma with component of uacs flared off gerd rx  Chief Complaint  Patient presents with   Acute Visit    She c/o prod cough for the past 6 wks- clear to light yellow sputum.    Dyspnea:  Limited by knees >  sob  Cough: worse x 2 weeks since stopped ppi / nexium   SABA use: hardly ever 02: no   Now living part time in [place]  taking care of her mother  Rec Resume nexium  20 mg Take 30-60 min before first meal of the day  GERD diet reviewed, bed blocks rec  If breathing cough or congestion worse > Prednisone  10 mg take  4 each am x 2 days,   2 each am x 2 days,  1 each am x 2 days and stop    02/16/2019 Phone visit recs (living in [place] )  Patient has MyChart but needs to change insurance to [place]  Pt would like [place] info  If get worse > Prednisone  10 mg take  4 each am x 2 days,   2 each am x 2 days,  1 each am x 2 days and stop  For drainage / throat tickle try take CHLORPHENIRAMINE  4 mg   Other option for nasal symptoms, coughing = trial of singulair  as don't see any evidence in Epic that it has been tried.     04/14/2023 Re-establish ov/[place] office/Lynniah Janoski re: asthma/ uacs  maint on [place] due to insurance and not well controlled    Chief Complaint  Patient presents with   Establish Care   Asthma  Dyspnea:  knees give out before breathing / rides scooter to shop/ uses walker 2 wheels at home  Cough: none but subjective wheeze day > noct  Sleeping: bed is flat/ one pillow s    resp cc  SABA use: has hfa not using  02: none  Rec Plan A = Automatic = Always=    Symbicort  160 (Breyna  160) Take 2 puffs first thing in am and then another 2 puffs about 12 hours later.  Work on inhaler technique:    Plan B = Backup (to supplement plan A, not to replace it) Only use your albuterol  inhaler as a rescue medication  Plan C = Crisis (instead of Plan B but only if Plan B stops working) - only use your albuterol  nebulizer if you first try Plan B Depomedrol 120 mg IM   Please schedule a follow up visit in 3 months but call sooner if needed - bring inhalers   Onset cough march 2025 and ? Pna dx rx doxy /amox/ pred    08/25/2023  f/u ov/[place] office/Reia Viernes re: symbicort  160  maint on  symbicort  160  did  not  bring inhalers  on prednisone  taper now at  10 mg x 3 each am   Chief Complaint  Patient presents with   Asthma  Dyspnea:  uses 2 wheeled walker at home but w/c to go out  Cough: not purulent  minimal vol  Sleeping: bed is flat/ 2 pillows with cough better with less saba noct  SABA use: neb before bed  02: none  Rec Plan A = Automatic = Always=    Symbicort  80 Take 2 puffs first thing in am and then another 2 puffs about 12 hours later.   Plan B = Backup (to supplement plan A, not to replace it) Only use your albuterol  inhaler as a rescue medication Plan C = Crisis (instead of Plan B but only if Plan B stops working) - only use your albuterol  nebulizer if you first try Plan B Finish prednisone   For cough/ congestion >  mucinex dm up to maximum of  1200 mg every 12 hours and use the flutter valve as much as you can   GERD diet reviewed, bed blocks rec   Please schedule a follow up office visit in 6 weeks, call sooner if needed with inhalers    10/06/2023  f/u ov/[place] office/Azarias Chiou re: asthma  cannot afford symbicort  / did not bring any meds  Chief Complaint  Patient presents with   Follow-up   Asthma  Dyspnea:  2 wheeled walker in house, use w/c to go out  Cough: mucus is grey and cough is much worse since May 2025 = harsh barking  Sleeping: bed is flat/ 2 pillows s    resp cc  SABA use: not using per abc plan nor using flutter mucinex or ppi  Rec For cough/ congestion >  mucinex dm up to maximum of  1200 mg every 12 hours and use the flutter valve as much as you can  Aciphex  20 mg Take 30-60 min before first meal of the day and pepcid  20 mg after supper until return  GERD diet reviewed, bed blocks rec  Try albuterol  2.5 mg with budesonide  0.25 mg mixed together twice daily  In between ok to use albuterol  up to every 4 hours if needed Please schedule a follow up office visit in 4 weeks, sooner if needed  with all medications /inhalers/ solutions in  hand    11/19/2023  f/u ov/[place] office/Dezmon Conover re: *** maint on *** did *** bring meds No chief complaint on file.   Dyspnea:  *** Cough: *** Sleeping: ***   resp cc  SABA use: *** 02: ***  Lung cancer screening: ***   No obvious day to day or daytime variability or assoc excess/ purulent sputum or mucus plugs or hemoptysis or cp or chest tightness, subjective wheeze or overt sinus or hb symptoms.    Also denies any obvious fluctuation of symptoms with weather or environmental changes or other aggravating or alleviating factors except as outlined above   No unusual exposure hx or h/o childhood pna/ asthma or knowledge of premature birth.  Current Allergies, Complete Past Medical History, Past Surgical History, Family History, and Social History were reviewed in [employer] record.  ROS  The following are not active complaints unless bolded Hoarseness, sore throat, dysphagia, dental problems, itching, sneezing,  nasal congestion or discharge of excess mucus or purulent secretions, ear ache,   fever, chills, sweats, unintended wt loss or wt gain, classically pleuritic or exertional cp,  orthopnea pnd or arm/hand swelling  or leg swelling, presyncope, palpitations, abdominal pain, anorexia, nausea, vomiting, diarrhea  or change in bowel habits or change in bladder habits, change in stools or change in urine, dysuria, hematuria,  rash, arthralgias, visual complaints, headache, numbness, weakness or ataxia or problems with walking or coordination,  change in mood or  memory.        No outpatient medications have been marked as taking for the 11/19/23 encounter (Appointment) with Darlean Ozell NOVAK, MD.                    Objective:   Physical Exam  11/19/2023       ***  08/25/2023       329  04/14/2023         324  (BMI 55)   11/13/2017         324  04/22/2017        324  11/07/2016          329  08/01/2016        335  07/01/2016        336   08/24/13 361 lb  (163.749 kg)  08/19/13 359 lb (162.841 kg)  03/08/13 369 lb (167.377 kg)     Vital signs reviewed  11/19/2023  - Note at rest 02 sats  ***% on ***   General appearance:    ***        bilteral lymphedema    LE venous statis changes bilaterally  ***    Assessment & Plan:

## 2023-11-24 DIAGNOSIS — M170 Bilateral primary osteoarthritis of knee: Secondary | ICD-10-CM | POA: Diagnosis not present

## 2023-11-24 DIAGNOSIS — L03115 Cellulitis of right lower limb: Secondary | ICD-10-CM | POA: Diagnosis not present

## 2023-11-24 DIAGNOSIS — J4530 Mild persistent asthma, uncomplicated: Secondary | ICD-10-CM | POA: Diagnosis not present

## 2023-11-24 DIAGNOSIS — I129 Hypertensive chronic kidney disease with stage 1 through stage 4 chronic kidney disease, or unspecified chronic kidney disease: Secondary | ICD-10-CM | POA: Diagnosis not present

## 2023-11-24 DIAGNOSIS — Z556 Problems related to health literacy: Secondary | ICD-10-CM | POA: Diagnosis not present

## 2023-11-24 DIAGNOSIS — I872 Venous insufficiency (chronic) (peripheral): Secondary | ICD-10-CM | POA: Diagnosis not present

## 2023-11-24 DIAGNOSIS — Z7901 Long term (current) use of anticoagulants: Secondary | ICD-10-CM | POA: Diagnosis not present

## 2023-11-24 DIAGNOSIS — I480 Paroxysmal atrial fibrillation: Secondary | ICD-10-CM | POA: Diagnosis not present

## 2023-11-24 DIAGNOSIS — N183 Chronic kidney disease, stage 3 unspecified: Secondary | ICD-10-CM | POA: Diagnosis not present

## 2023-11-24 DIAGNOSIS — S80822D Blister (nonthermal), left lower leg, subsequent encounter: Secondary | ICD-10-CM | POA: Diagnosis not present

## 2023-11-24 DIAGNOSIS — L97221 Non-pressure chronic ulcer of left calf limited to breakdown of skin: Secondary | ICD-10-CM | POA: Diagnosis not present

## 2023-11-24 DIAGNOSIS — S80821D Blister (nonthermal), right lower leg, subsequent encounter: Secondary | ICD-10-CM | POA: Diagnosis not present

## 2023-11-24 DIAGNOSIS — L03116 Cellulitis of left lower limb: Secondary | ICD-10-CM | POA: Diagnosis not present

## 2023-11-24 DIAGNOSIS — E1122 Type 2 diabetes mellitus with diabetic chronic kidney disease: Secondary | ICD-10-CM | POA: Diagnosis not present

## 2023-11-25 ENCOUNTER — Encounter

## 2023-11-27 DIAGNOSIS — L03115 Cellulitis of right lower limb: Secondary | ICD-10-CM | POA: Diagnosis not present

## 2023-11-27 DIAGNOSIS — L03116 Cellulitis of left lower limb: Secondary | ICD-10-CM | POA: Diagnosis not present

## 2023-11-27 DIAGNOSIS — I129 Hypertensive chronic kidney disease with stage 1 through stage 4 chronic kidney disease, or unspecified chronic kidney disease: Secondary | ICD-10-CM | POA: Diagnosis not present

## 2023-11-27 DIAGNOSIS — S80822D Blister (nonthermal), left lower leg, subsequent encounter: Secondary | ICD-10-CM | POA: Diagnosis not present

## 2023-11-27 DIAGNOSIS — N183 Chronic kidney disease, stage 3 unspecified: Secondary | ICD-10-CM | POA: Diagnosis not present

## 2023-11-27 DIAGNOSIS — I480 Paroxysmal atrial fibrillation: Secondary | ICD-10-CM | POA: Diagnosis not present

## 2023-11-27 DIAGNOSIS — M170 Bilateral primary osteoarthritis of knee: Secondary | ICD-10-CM | POA: Diagnosis not present

## 2023-11-27 DIAGNOSIS — E1122 Type 2 diabetes mellitus with diabetic chronic kidney disease: Secondary | ICD-10-CM | POA: Diagnosis not present

## 2023-11-27 DIAGNOSIS — S80821D Blister (nonthermal), right lower leg, subsequent encounter: Secondary | ICD-10-CM | POA: Diagnosis not present

## 2023-11-27 DIAGNOSIS — L97221 Non-pressure chronic ulcer of left calf limited to breakdown of skin: Secondary | ICD-10-CM | POA: Diagnosis not present

## 2023-11-27 DIAGNOSIS — J4530 Mild persistent asthma, uncomplicated: Secondary | ICD-10-CM | POA: Diagnosis not present

## 2023-11-27 DIAGNOSIS — I872 Venous insufficiency (chronic) (peripheral): Secondary | ICD-10-CM | POA: Diagnosis not present

## 2023-11-27 DIAGNOSIS — Z556 Problems related to health literacy: Secondary | ICD-10-CM | POA: Diagnosis not present

## 2023-11-27 DIAGNOSIS — Z7901 Long term (current) use of anticoagulants: Secondary | ICD-10-CM | POA: Diagnosis not present

## 2023-11-30 ENCOUNTER — Ambulatory Visit

## 2023-12-01 ENCOUNTER — Ambulatory Visit: Payer: Self-pay

## 2023-12-01 DIAGNOSIS — M170 Bilateral primary osteoarthritis of knee: Secondary | ICD-10-CM | POA: Diagnosis not present

## 2023-12-01 DIAGNOSIS — S80822D Blister (nonthermal), left lower leg, subsequent encounter: Secondary | ICD-10-CM | POA: Diagnosis not present

## 2023-12-01 DIAGNOSIS — L03116 Cellulitis of left lower limb: Secondary | ICD-10-CM | POA: Diagnosis not present

## 2023-12-01 DIAGNOSIS — Z7901 Long term (current) use of anticoagulants: Secondary | ICD-10-CM | POA: Diagnosis not present

## 2023-12-01 DIAGNOSIS — L03115 Cellulitis of right lower limb: Secondary | ICD-10-CM | POA: Diagnosis not present

## 2023-12-01 DIAGNOSIS — I87312 Chronic venous hypertension (idiopathic) with ulcer of left lower extremity: Secondary | ICD-10-CM | POA: Diagnosis not present

## 2023-12-01 DIAGNOSIS — I129 Hypertensive chronic kidney disease with stage 1 through stage 4 chronic kidney disease, or unspecified chronic kidney disease: Secondary | ICD-10-CM | POA: Diagnosis not present

## 2023-12-01 DIAGNOSIS — L97221 Non-pressure chronic ulcer of left calf limited to breakdown of skin: Secondary | ICD-10-CM | POA: Diagnosis not present

## 2023-12-01 DIAGNOSIS — J4530 Mild persistent asthma, uncomplicated: Secondary | ICD-10-CM | POA: Diagnosis not present

## 2023-12-01 DIAGNOSIS — I872 Venous insufficiency (chronic) (peripheral): Secondary | ICD-10-CM | POA: Diagnosis not present

## 2023-12-01 DIAGNOSIS — Z556 Problems related to health literacy: Secondary | ICD-10-CM | POA: Diagnosis not present

## 2023-12-01 DIAGNOSIS — E1122 Type 2 diabetes mellitus with diabetic chronic kidney disease: Secondary | ICD-10-CM | POA: Diagnosis not present

## 2023-12-01 DIAGNOSIS — S80821D Blister (nonthermal), right lower leg, subsequent encounter: Secondary | ICD-10-CM | POA: Diagnosis not present

## 2023-12-01 DIAGNOSIS — I480 Paroxysmal atrial fibrillation: Secondary | ICD-10-CM | POA: Diagnosis not present

## 2023-12-01 DIAGNOSIS — N183 Chronic kidney disease, stage 3 unspecified: Secondary | ICD-10-CM | POA: Diagnosis not present

## 2023-12-01 NOTE — Telephone Encounter (Signed)
 FYI Only or Action Required?: FYI only for provider.  Patient was last seen in primary care on 09/23/2023 by Bevely Doffing, FNP.  Called Nurse Triage reporting Leg Pain.  Symptoms began several weeks ago.  Interventions attempted: Prescription medications: hydrocodone .  Symptoms are: unchanged.  Triage Disposition: See PCP When Office is Open (Within 3 Days)  Patient/caregiver understands and will follow disposition?: Yes    Copied from CRM #[id number]. Topic: Clinical - Red Word Triage >> Dec 01, 2023  8:12 AM Willma R wrote: [facility] that prompted transfer to Nurse Triage: Patient has been having a lot of pain in both legs going up into her hips, worse in the right. thinks its her Sciatic nerve. Wants to discuss pain medicine was previously given. Reason for Disposition  [1] MODERATE pain (e.g., interferes with normal activities, limping) AND [2] present > 3 days  Answer Assessment - Initial Assessment Questions 1. ONSET: When did the pain start?      2 wks 2. LOCATION: Where is the pain located?      Both legs 3. PAIN: How bad is the pain?    (Scale 1-10; or mild, moderate, severe)     6/10 4. WORK OR EXERCISE: Has there been any recent work or exercise that involved this part of the body?      no 5. CAUSE: What do you think is causing the leg pain?     Sciatic nerve 6. OTHER SYMPTOMS: Do you have any other symptoms? (e.g., chest pain, back pain, breathing difficulty, swelling, rash, fever, numbness, weakness)     Runs up back of legs into hips and lower  back, and knee pain  Protocols used: Leg Pain-A-AH

## 2023-12-02 ENCOUNTER — Ambulatory Visit: Attending: Cardiology | Admitting: *Deleted

## 2023-12-02 DIAGNOSIS — Z5181 Encounter for therapeutic drug level monitoring: Secondary | ICD-10-CM

## 2023-12-02 DIAGNOSIS — I480 Paroxysmal atrial fibrillation: Secondary | ICD-10-CM | POA: Diagnosis not present

## 2023-12-02 LAB — POCT INR: INR: 1.5 — AB (ref 2.0–3.0)

## 2023-12-02 NOTE — Patient Instructions (Signed)
 Take warfarin 2 tablets tonight then resume 1 tablet daily except 1 1/2 tablets on Tuesdays, Thursdays and Saturdays Recheck INR in 3 weeks

## 2023-12-03 ENCOUNTER — Telehealth: Payer: Self-pay

## 2023-12-03 ENCOUNTER — Ambulatory Visit: Admitting: Nurse Practitioner

## 2023-12-03 ENCOUNTER — Ambulatory Visit: Payer: Self-pay

## 2023-12-03 ENCOUNTER — Other Ambulatory Visit: Payer: Self-pay | Admitting: Family Medicine

## 2023-12-03 DIAGNOSIS — L97221 Non-pressure chronic ulcer of left calf limited to breakdown of skin: Secondary | ICD-10-CM | POA: Diagnosis not present

## 2023-12-03 DIAGNOSIS — L03115 Cellulitis of right lower limb: Secondary | ICD-10-CM | POA: Diagnosis not present

## 2023-12-03 DIAGNOSIS — E1122 Type 2 diabetes mellitus with diabetic chronic kidney disease: Secondary | ICD-10-CM | POA: Diagnosis not present

## 2023-12-03 DIAGNOSIS — N183 Chronic kidney disease, stage 3 unspecified: Secondary | ICD-10-CM | POA: Diagnosis not present

## 2023-12-03 DIAGNOSIS — L03116 Cellulitis of left lower limb: Secondary | ICD-10-CM | POA: Diagnosis not present

## 2023-12-03 DIAGNOSIS — I480 Paroxysmal atrial fibrillation: Secondary | ICD-10-CM | POA: Diagnosis not present

## 2023-12-03 DIAGNOSIS — Z7901 Long term (current) use of anticoagulants: Secondary | ICD-10-CM | POA: Diagnosis not present

## 2023-12-03 DIAGNOSIS — Z556 Problems related to health literacy: Secondary | ICD-10-CM | POA: Diagnosis not present

## 2023-12-03 DIAGNOSIS — S80821D Blister (nonthermal), right lower leg, subsequent encounter: Secondary | ICD-10-CM | POA: Diagnosis not present

## 2023-12-03 DIAGNOSIS — M170 Bilateral primary osteoarthritis of knee: Secondary | ICD-10-CM | POA: Diagnosis not present

## 2023-12-03 DIAGNOSIS — I872 Venous insufficiency (chronic) (peripheral): Secondary | ICD-10-CM | POA: Diagnosis not present

## 2023-12-03 DIAGNOSIS — J4530 Mild persistent asthma, uncomplicated: Secondary | ICD-10-CM | POA: Diagnosis not present

## 2023-12-03 DIAGNOSIS — I129 Hypertensive chronic kidney disease with stage 1 through stage 4 chronic kidney disease, or unspecified chronic kidney disease: Secondary | ICD-10-CM | POA: Diagnosis not present

## 2023-12-03 DIAGNOSIS — S80822D Blister (nonthermal), left lower leg, subsequent encounter: Secondary | ICD-10-CM | POA: Diagnosis not present

## 2023-12-03 NOTE — Telephone Encounter (Signed)
 FYI Only or Action Required?: FYI only for provider. - sent refill request for Norco as well, appt scheduled with PCP for tomorrow  Patient was last seen in primary care on 09/23/2023 by Bevely Doffing, FNP.  Called Nurse Triage reporting Leg Pain, Knee Pain, sciatic nerve pain, worsening neuropathy legs/feet, and pitting edema both legs.  Symptoms began several weeks ago.  Interventions attempted: Prescription medications: Norco used sparingly, Rest, hydration, or home remedies, Ice/heat application, and Other: massager on buttocks for sciatic pain.  Symptoms are: gradually worsening.  Triage Disposition: See Physician Within 24 Hours  Patient/caregiver understands and will follow disposition?: Yes     Copied from CRM #[id number]. Topic: Clinical - Red Word Triage >> Dec 03, 2023 12:37 PM Lauren C wrote: Red Word that prompted transfer to Nurse Triage: leg pain/knee pain, sciatic nerve pain. Appt scheduled incorrectly for today, wrong location. Previously triaged. Reason for Disposition  Numbness in a leg or foot (i.e., loss of sensation)    Worsening neuropathy  Answer Assessment - Initial Assessment Questions 1. ONSET: When did the pain start?      Turned into sciatic pain just recently, 1.5 weeks ago, had been diagnosed with sciatic problems back in 2022, this is first time since then that had this problem 2. LOCATION: Where is the pain located?      Mostly in hip radiating down to back of knee, both sides, right side is worse 3. PAIN: How bad is the pain?    (Scale 1-10; or mild, moderate, severe)     Knee and leg pain along with sciatic pain as long as sitting down it's okay but sciatic pain worse when sitting, would say about 5/10 when sitting, when up moving around goes up to 7-8/10 4. WORK OR EXERCISE: Has there been any recent work or exercise that involved this part of the body?      No not really, I'm pretty sedentary, don't get up and get lot of exercise because of  knee and leg pain, both knees bone on bone so don't get up and move around a whole lot, probably what made sciatic pain kick in 6. OTHER SYMPTOMS: Do you have any other symptoms? (e.g., chest pain, back pain, breathing difficulty, swelling, rash, fever, numbness, weakness)     Have edema pretty bad in both legs, also treated me for skin breakdown so recently had me seeing PT to wrap legs to help with swelling but didn't help very much, bandages just wouldn't stay on my legs, so they just discharged that, pitting edema including thighs, no fluid weeping right now, that has been helped Redness, just noticed redness, you would have to take a look at it, both feet are discolored to a point, red and, can't describe it, been ongoing No added warmth to the legs Have neuropathy in both legs and feet so numbness and tingling, gotten worse to both recently since maybe 3 months ago just before started wrapping legs, no worsening in past 1.5 weeks No chest pain, SOB, or back pain Weakness that have in legs not worse than usual, about the same  Just ran out of hydrocodone , that was helping with pain, last time saw her was 2-3 months ago and gave script, been pretty sparing with it but been helping me a lot getting through, took last one yesterday Also use heating pad and that's been helping, also have heavy duty vibrator from PT putting on buttocks to shake nerve loose   Advised pt be examined  shortly with PCP, scheduled earliest available with PCP for tomorrow afternoon, confirmed at correct location, advised call back if worsening or new symptoms. Pt also requesting refill of hydrocodone .  Protocols used: Leg Pain-A-AH

## 2023-12-03 NOTE — Telephone Encounter (Signed)
Noted patient scheduled

## 2023-12-04 ENCOUNTER — Encounter: Payer: Self-pay | Admitting: Internal Medicine

## 2023-12-04 ENCOUNTER — Ambulatory Visit (INDEPENDENT_AMBULATORY_CARE_PROVIDER_SITE_OTHER)

## 2023-12-04 VITALS — BP 101/69 | HR 94 | Ht 64.0 in | Wt 351.0 lb

## 2023-12-04 DIAGNOSIS — M170 Bilateral primary osteoarthritis of knee: Secondary | ICD-10-CM | POA: Diagnosis not present

## 2023-12-04 DIAGNOSIS — N183 Chronic kidney disease, stage 3 unspecified: Secondary | ICD-10-CM

## 2023-12-04 DIAGNOSIS — G894 Chronic pain syndrome: Secondary | ICD-10-CM

## 2023-12-04 DIAGNOSIS — I872 Venous insufficiency (chronic) (peripheral): Secondary | ICD-10-CM

## 2023-12-04 MED ORDER — HYDROCODONE-ACETAMINOPHEN 10-325 MG PO TABS: 1.0000 | ORAL_TABLET | Freq: Three times a day (TID) | ORAL | 0 refills | Status: AC | PRN

## 2023-12-04 MED ORDER — POTASSIUM CHLORIDE CRYS ER 20 MEQ PO TBCR: 20.0000 meq | EXTENDED_RELEASE_TABLET | Freq: Every day | ORAL | 1 refills | Status: DC

## 2023-12-04 MED ORDER — FUROSEMIDE 40 MG PO TABS: 40.0000 mg | ORAL_TABLET | Freq: Every day | ORAL | 1 refills | Status: DC

## 2023-12-04 NOTE — Telephone Encounter (Signed)
 This nurse's note from yesterday:  Pt just ran out of Norco yesterday, scheduled appt for tomorrow with PCP, pt requesting refill, last refilled 09/23/23, not in current med list for nurse to pend. Please advise.   Per pt chart, script was filled at today's OV.

## 2023-12-04 NOTE — Progress Notes (Signed)
 Established Patient Office Visit  Subjective   Patient ID: Alexis [name], female    DOB: [birth year]  Age: 68 y.o. MRN: [id number]  Chief Complaint  Patient presents with   Medical Management of Chronic Issues    Worsening leg pain and swelling been an ongoing issue for a while    HPI Discussed the use of AI scribe software for clinical note transcription with the patient, who gave verbal consent to proceed.  History of Present Illness   Alexis [name] is a 68 year old female with neuropathy who presents with worsening pain and medication management.  Peripheral neuropathy symptoms and pain management - Severe neuropathy in both legs, characterized by heat and itching sensations - Discomfort is exacerbated by bandages, necessitating her removal - Allergic to gabapentin, limiting pharmacologic options for neuropathic pain - Uses hydrocodone  for pain management, initially every eight hours - Recently completed a course of hydrocodone  and requests a refill  Diuretic use - Takes furosemide  (Lasix ) daily, two tablets twice a day, usually together early in the day  Wound care and bandaging needs - Requires Velcro bandages due to exacerbation of neuropathic symptoms with standard bandages - In contact with [facility] regarding bandage needs - Recalls [employer] as a potential supplier for Velcro bandages      ROS    Objective:     BP 101/69   Pulse 94   Ht 5' 4 (1.626 m)   Wt (!) 351 lb (159.2 kg)   SpO2 95%   BMI 60.25 kg/m  BP Readings from Last 3 Encounters:  12/04/23 101/69  10/06/23 126/89  08/25/23 126/73   Wt Readings from Last 3 Encounters:  12/04/23 (!) 351 lb (159.2 kg)  10/06/23 (!) 342 lb (155.1 kg)  09/23/23 (!) 340 lb 0.6 oz (154.2 kg)     Physical Exam Vitals and nursing note reviewed.  Constitutional:      General: She is not in acute distress.    Appearance: Normal appearance. She is obese.  HENT:     Head: Normocephalic.  Eyes:      Extraocular Movements: Extraocular movements intact.     Pupils: Pupils are equal, round, and reactive to light.  Cardiovascular:     Rate and Rhythm: Normal rate and regular rhythm.     Pulses: Normal pulses.     Heart sounds: Normal heart sounds.  Pulmonary:     Effort: Pulmonary effort is normal. No respiratory distress.     Breath sounds: Wheezing and rhonchi present.  Abdominal:     General: Bowel sounds are normal.     Palpations: Abdomen is soft.     Tenderness: There is no abdominal tenderness. There is no guarding.  Musculoskeletal:     Right lower leg: Tenderness present. 2+ Pitting Edema present.     Left lower leg: Tenderness present. 2+ Pitting Edema present.     Comments: Large fluid-filled blisters present on anterior shins.  Mild weeping on posterior aspect of calves.  Skin:    Capillary Refill: Capillary refill takes less than 2 seconds.  Neurological:     Mental Status: She is alert and oriented to person, place, and time.     Gait: Gait abnormal (using WC).  Psychiatric:        Mood and Affect: Mood normal.        Behavior: Behavior normal.        Thought Content: Thought content normal.      The 10-year ASCVD risk score (  Arnett DK, et al., 2019) is: 10.8%    Assessment & Plan:   Problem List Items Addressed This Visit       Cardiovascular and Mediastinum   Stasis dermatitis of both legs - Primary   She was unable to continue with home health wound care, as the bandages aggravated her peripheral neuropathy, and she states that the bandages kept falling down her leg.   She feels that velcro bandages would help keep the bandages on her legs.   I have agreed to send an order to [facility] for Velcro bandages.       Relevant Medications   furosemide  (LASIX ) 40 MG tablet   potassium chloride  SA (KLOR-CON  M) 20 MEQ tablet   Other Relevant Orders   Basic Metabolic Panel (BMET) (Completed)     Musculoskeletal and Integument   Primary  osteoarthritis of both knees   Chronic pain managed with hydrocodone , now required every eight hours. - Refill hydrocodone  prescription for 30 days. -PDMP was reviewed.       Relevant Medications   HYDROcodone -acetaminophen  (NORCO) 10-325 MG tablet     Genitourinary   CKD (chronic kidney disease) stage 3, GFR 30-59 ml/min (HCC)   Stage 3 CKD requires monitoring of kidney function and potassium, especially with increased furosemide  use. - Order blood test for kidney function and potassium levels today. - Continue furosemide  twice daily. - Prescribe potassium supplement.       Relevant Orders   Basic Metabolic Panel (BMET) (Completed)     Other   Chronic pain   Chronic pain managed with hydrocodone , now required every eight hours. - Refill hydrocodone  prescription for 30 days. -PDMP reviewed.      Relevant Medications   HYDROcodone -acetaminophen  (NORCO) 10-325 MG tablet             No follow-ups on file.    Leita Longs, FNP

## 2023-12-05 LAB — BASIC METABOLIC PANEL WITH GFR
BUN/Creatinine Ratio: 12 (ref 12–28)
BUN: 9 mg/dL (ref 8–27)
CO2: 24 mmol/L (ref 20–29)
Calcium: 8.9 mg/dL (ref 8.7–10.3)
Chloride: 102 mmol/L (ref 96–106)
Creatinine, Ser: 0.78 mg/dL (ref 0.57–1.00)
Glucose: 130 mg/dL — ABNORMAL HIGH (ref 70–99)
Potassium: 4.5 mmol/L (ref 3.5–5.2)
Sodium: 140 mmol/L (ref 134–144)
eGFR: 83 mL/min/1.73 (ref >59)

## 2023-12-07 ENCOUNTER — Ambulatory Visit: Payer: Self-pay

## 2023-12-07 NOTE — Assessment & Plan Note (Signed)
 She was unable to continue with home health wound care, as the bandages aggravated her peripheral neuropathy, and she states that the bandages kept falling down her leg.   She feels that velcro bandages would help keep the bandages on her legs.   I have agreed to send an order to [facility] for Velcro bandages.

## 2023-12-07 NOTE — Assessment & Plan Note (Signed)
 Chronic pain managed with hydrocodone , now required every eight hours. - Refill hydrocodone  prescription for 30 days. -PDMP was reviewed.

## 2023-12-07 NOTE — Assessment & Plan Note (Signed)
 Chronic pain managed with hydrocodone , now required every eight hours. - Refill hydrocodone  prescription for 30 days. -PDMP reviewed.

## 2023-12-07 NOTE — Assessment & Plan Note (Signed)
 Stage 3 CKD requires monitoring of kidney function and potassium, especially with increased furosemide  use. - Order blood test for kidney function and potassium levels today. - Continue furosemide  twice daily. - Prescribe potassium supplement.

## 2023-12-08 ENCOUNTER — Telehealth: Payer: Self-pay

## 2023-12-08 ENCOUNTER — Other Ambulatory Visit: Payer: Self-pay

## 2023-12-08 MED ORDER — BREZTRI AEROSPHERE 160-9-4.8 MCG/ACT IN AERO: 2.0000 | INHALATION_SPRAY | Freq: Two times a day (BID) | RESPIRATORY_TRACT | Status: AC

## 2023-12-08 NOTE — Telephone Encounter (Signed)
 Please place this referral - thank you

## 2023-12-08 NOTE — Telephone Encounter (Signed)
 Copied from CRM #[id number]. Topic: General - Other >> Dec 08, 2023 11:13 AM Roselie BROCKS wrote: Reason for CRM: Patient wants to be set up with [facility] care

## 2023-12-08 NOTE — Telephone Encounter (Signed)
 Pt requesting breztri  samples , pt no showed her LOV 8/14 and your wishes were to have pt schedule for 4 oclocks only.  Would you like me to give pt sample?

## 2023-12-09 NOTE — Telephone Encounter (Signed)
 Patient accepted to [facility] per referral note

## 2023-12-15 NOTE — Progress Notes (Signed)
 INR-1.5; Please see anticoagulation encounter

## 2023-12-16 ENCOUNTER — Telehealth: Payer: Self-pay

## 2023-12-16 ENCOUNTER — Other Ambulatory Visit: Payer: Self-pay

## 2023-12-16 DIAGNOSIS — I872 Venous insufficiency (chronic) (peripheral): Secondary | ICD-10-CM

## 2023-12-16 NOTE — Telephone Encounter (Signed)
 Copied from CRM #[id number]. Topic: General - Other >> Dec 15, 2023  4:08 PM Tobias L wrote: Reason for CRM: Patient sent mychart message requesting order for home health services to be sent to [facility].   Patient did not hear back from anyone, patient requesting update either via mychart or phone call, [phone]

## 2023-12-16 NOTE — Telephone Encounter (Signed)
 Message sent to provider to approve referral, patient declined last HH , so referral was closed

## 2023-12-21 ENCOUNTER — Telehealth: Payer: Self-pay | Admitting: Family Medicine

## 2023-12-21 NOTE — Telephone Encounter (Signed)
 Orders sent to [facility] per referral chart

## 2023-12-21 NOTE — Telephone Encounter (Signed)
 Copied from CRM #[id number]. Topic: General - Other >> Dec 21, 2023  1:25 PM Rosaria E wrote: Reason for CRM: Pt called to share the correct fax number for [facility], the order is for leg wraps for edema  Fax: [phone]

## 2023-12-23 ENCOUNTER — Other Ambulatory Visit: Payer: Self-pay | Admitting: Internal Medicine

## 2023-12-23 NOTE — Telephone Encounter (Signed)
 Please follow up -- I'm not sure what the patient is requesting.

## 2023-12-24 DIAGNOSIS — J4520 Mild intermittent asthma, uncomplicated: Secondary | ICD-10-CM | POA: Diagnosis not present

## 2023-12-24 DIAGNOSIS — Z556 Problems related to health literacy: Secondary | ICD-10-CM | POA: Diagnosis not present

## 2023-12-24 DIAGNOSIS — E1142 Type 2 diabetes mellitus with diabetic polyneuropathy: Secondary | ICD-10-CM | POA: Diagnosis not present

## 2023-12-24 DIAGNOSIS — I129 Hypertensive chronic kidney disease with stage 1 through stage 4 chronic kidney disease, or unspecified chronic kidney disease: Secondary | ICD-10-CM | POA: Diagnosis not present

## 2023-12-24 DIAGNOSIS — I872 Venous insufficiency (chronic) (peripheral): Secondary | ICD-10-CM | POA: Diagnosis not present

## 2023-12-24 DIAGNOSIS — E1122 Type 2 diabetes mellitus with diabetic chronic kidney disease: Secondary | ICD-10-CM | POA: Diagnosis not present

## 2023-12-24 DIAGNOSIS — N183 Chronic kidney disease, stage 3 unspecified: Secondary | ICD-10-CM | POA: Diagnosis not present

## 2023-12-24 DIAGNOSIS — G894 Chronic pain syndrome: Secondary | ICD-10-CM | POA: Diagnosis not present

## 2023-12-24 DIAGNOSIS — Z7951 Long term (current) use of inhaled steroids: Secondary | ICD-10-CM | POA: Diagnosis not present

## 2023-12-24 DIAGNOSIS — Z7901 Long term (current) use of anticoagulants: Secondary | ICD-10-CM | POA: Diagnosis not present

## 2023-12-24 DIAGNOSIS — M170 Bilateral primary osteoarthritis of knee: Secondary | ICD-10-CM | POA: Diagnosis not present

## 2023-12-24 DIAGNOSIS — I480 Paroxysmal atrial fibrillation: Secondary | ICD-10-CM | POA: Diagnosis not present

## 2023-12-25 DIAGNOSIS — G894 Chronic pain syndrome: Secondary | ICD-10-CM | POA: Diagnosis not present

## 2023-12-25 DIAGNOSIS — I872 Venous insufficiency (chronic) (peripheral): Secondary | ICD-10-CM | POA: Diagnosis not present

## 2023-12-25 DIAGNOSIS — E1122 Type 2 diabetes mellitus with diabetic chronic kidney disease: Secondary | ICD-10-CM | POA: Diagnosis not present

## 2023-12-25 DIAGNOSIS — E1142 Type 2 diabetes mellitus with diabetic polyneuropathy: Secondary | ICD-10-CM | POA: Diagnosis not present

## 2023-12-25 DIAGNOSIS — I129 Hypertensive chronic kidney disease with stage 1 through stage 4 chronic kidney disease, or unspecified chronic kidney disease: Secondary | ICD-10-CM | POA: Diagnosis not present

## 2023-12-25 DIAGNOSIS — Z556 Problems related to health literacy: Secondary | ICD-10-CM | POA: Diagnosis not present

## 2023-12-25 DIAGNOSIS — Z7951 Long term (current) use of inhaled steroids: Secondary | ICD-10-CM | POA: Diagnosis not present

## 2023-12-25 DIAGNOSIS — Z7901 Long term (current) use of anticoagulants: Secondary | ICD-10-CM | POA: Diagnosis not present

## 2023-12-25 DIAGNOSIS — I480 Paroxysmal atrial fibrillation: Secondary | ICD-10-CM | POA: Diagnosis not present

## 2023-12-25 DIAGNOSIS — J4520 Mild intermittent asthma, uncomplicated: Secondary | ICD-10-CM | POA: Diagnosis not present

## 2023-12-25 DIAGNOSIS — M170 Bilateral primary osteoarthritis of knee: Secondary | ICD-10-CM | POA: Diagnosis not present

## 2023-12-25 DIAGNOSIS — N183 Chronic kidney disease, stage 3 unspecified: Secondary | ICD-10-CM | POA: Diagnosis not present

## 2023-12-30 ENCOUNTER — Encounter

## 2023-12-31 DIAGNOSIS — Z7901 Long term (current) use of anticoagulants: Secondary | ICD-10-CM | POA: Diagnosis not present

## 2023-12-31 DIAGNOSIS — I480 Paroxysmal atrial fibrillation: Secondary | ICD-10-CM | POA: Diagnosis not present

## 2023-12-31 DIAGNOSIS — Z7951 Long term (current) use of inhaled steroids: Secondary | ICD-10-CM | POA: Diagnosis not present

## 2023-12-31 DIAGNOSIS — N183 Chronic kidney disease, stage 3 unspecified: Secondary | ICD-10-CM | POA: Diagnosis not present

## 2023-12-31 DIAGNOSIS — M170 Bilateral primary osteoarthritis of knee: Secondary | ICD-10-CM | POA: Diagnosis not present

## 2023-12-31 DIAGNOSIS — J4520 Mild intermittent asthma, uncomplicated: Secondary | ICD-10-CM | POA: Diagnosis not present

## 2023-12-31 DIAGNOSIS — E1142 Type 2 diabetes mellitus with diabetic polyneuropathy: Secondary | ICD-10-CM | POA: Diagnosis not present

## 2023-12-31 DIAGNOSIS — Z556 Problems related to health literacy: Secondary | ICD-10-CM | POA: Diagnosis not present

## 2023-12-31 DIAGNOSIS — I872 Venous insufficiency (chronic) (peripheral): Secondary | ICD-10-CM | POA: Diagnosis not present

## 2023-12-31 DIAGNOSIS — G894 Chronic pain syndrome: Secondary | ICD-10-CM | POA: Diagnosis not present

## 2023-12-31 DIAGNOSIS — I129 Hypertensive chronic kidney disease with stage 1 through stage 4 chronic kidney disease, or unspecified chronic kidney disease: Secondary | ICD-10-CM | POA: Diagnosis not present

## 2023-12-31 DIAGNOSIS — E1122 Type 2 diabetes mellitus with diabetic chronic kidney disease: Secondary | ICD-10-CM | POA: Diagnosis not present

## 2024-01-04 ENCOUNTER — Telehealth: Payer: Self-pay | Admitting: Internal Medicine

## 2024-01-04 ENCOUNTER — Encounter: Payer: Self-pay | Admitting: Internal Medicine

## 2024-01-04 ENCOUNTER — Ambulatory Visit: Attending: Cardiology | Admitting: *Deleted

## 2024-01-04 DIAGNOSIS — Z5181 Encounter for therapeutic drug level monitoring: Secondary | ICD-10-CM

## 2024-01-04 DIAGNOSIS — I480 Paroxysmal atrial fibrillation: Secondary | ICD-10-CM

## 2024-01-04 LAB — POCT INR: INR: 1.6 — AB (ref 2.0–3.0)

## 2024-01-04 NOTE — Telephone Encounter (Unsigned)
 Copied from CRM #[id number]. Topic: Clinical - Medication Question >> Jan 04, 2024  9:45 AM [name] SAUNDERS wrote: Reason for CRM: Patient is inquiring about Breztri  samples, if available please call patient back.

## 2024-01-04 NOTE — Progress Notes (Signed)
 INR 1.6; Please see anticoagulation encounter

## 2024-01-04 NOTE — Patient Instructions (Signed)
 Increase warfarin to 1 1/2 tablets daily except 1 tablet on Sundays Recheck INR in 3 weeks

## 2024-01-05 NOTE — Telephone Encounter (Signed)
 Pt is requesting samples of breztri  again, we have given her 2 samples previously your last note regarding pt and getting samples was:  Dr Leisa notes about your samples is that we can give you 2 but you will need to keep up with appts or we won't be able to supply samples. There is 2 samples at my front office in [place]. Thanks.   Pt has appt for 01/21/2024

## 2024-01-06 ENCOUNTER — Other Ambulatory Visit: Payer: Self-pay

## 2024-01-06 MED ORDER — PREDNISONE 10 MG PO TABS: ORAL_TABLET | ORAL | 0 refills | Status: DC

## 2024-01-06 NOTE — Telephone Encounter (Signed)
 Okay got it - now I have a msg from pt requesting prednisone  for cough

## 2024-01-06 NOTE — Telephone Encounter (Signed)
 Sent mychart msg to inform pt of rx sent and samples up front

## 2024-01-07 DIAGNOSIS — I480 Paroxysmal atrial fibrillation: Secondary | ICD-10-CM | POA: Diagnosis not present

## 2024-01-07 DIAGNOSIS — Z7951 Long term (current) use of inhaled steroids: Secondary | ICD-10-CM | POA: Diagnosis not present

## 2024-01-07 DIAGNOSIS — E1122 Type 2 diabetes mellitus with diabetic chronic kidney disease: Secondary | ICD-10-CM | POA: Diagnosis not present

## 2024-01-07 DIAGNOSIS — Z7901 Long term (current) use of anticoagulants: Secondary | ICD-10-CM | POA: Diagnosis not present

## 2024-01-07 DIAGNOSIS — I872 Venous insufficiency (chronic) (peripheral): Secondary | ICD-10-CM | POA: Diagnosis not present

## 2024-01-07 DIAGNOSIS — I129 Hypertensive chronic kidney disease with stage 1 through stage 4 chronic kidney disease, or unspecified chronic kidney disease: Secondary | ICD-10-CM | POA: Diagnosis not present

## 2024-01-07 DIAGNOSIS — G894 Chronic pain syndrome: Secondary | ICD-10-CM | POA: Diagnosis not present

## 2024-01-07 DIAGNOSIS — N183 Chronic kidney disease, stage 3 unspecified: Secondary | ICD-10-CM | POA: Diagnosis not present

## 2024-01-07 DIAGNOSIS — E1142 Type 2 diabetes mellitus with diabetic polyneuropathy: Secondary | ICD-10-CM | POA: Diagnosis not present

## 2024-01-07 DIAGNOSIS — J4520 Mild intermittent asthma, uncomplicated: Secondary | ICD-10-CM | POA: Diagnosis not present

## 2024-01-07 DIAGNOSIS — M170 Bilateral primary osteoarthritis of knee: Secondary | ICD-10-CM | POA: Diagnosis not present

## 2024-01-07 DIAGNOSIS — Z556 Problems related to health literacy: Secondary | ICD-10-CM | POA: Diagnosis not present

## 2024-01-08 ENCOUNTER — Other Ambulatory Visit: Payer: Self-pay

## 2024-01-08 ENCOUNTER — Other Ambulatory Visit: Payer: Self-pay | Admitting: Family Medicine

## 2024-01-08 DIAGNOSIS — I872 Venous insufficiency (chronic) (peripheral): Secondary | ICD-10-CM

## 2024-01-13 DIAGNOSIS — Z7901 Long term (current) use of anticoagulants: Secondary | ICD-10-CM | POA: Diagnosis not present

## 2024-01-13 DIAGNOSIS — G894 Chronic pain syndrome: Secondary | ICD-10-CM | POA: Diagnosis not present

## 2024-01-13 DIAGNOSIS — E1122 Type 2 diabetes mellitus with diabetic chronic kidney disease: Secondary | ICD-10-CM | POA: Diagnosis not present

## 2024-01-13 DIAGNOSIS — I129 Hypertensive chronic kidney disease with stage 1 through stage 4 chronic kidney disease, or unspecified chronic kidney disease: Secondary | ICD-10-CM | POA: Diagnosis not present

## 2024-01-13 DIAGNOSIS — M170 Bilateral primary osteoarthritis of knee: Secondary | ICD-10-CM | POA: Diagnosis not present

## 2024-01-13 DIAGNOSIS — Z7951 Long term (current) use of inhaled steroids: Secondary | ICD-10-CM | POA: Diagnosis not present

## 2024-01-13 DIAGNOSIS — E1142 Type 2 diabetes mellitus with diabetic polyneuropathy: Secondary | ICD-10-CM | POA: Diagnosis not present

## 2024-01-13 DIAGNOSIS — N183 Chronic kidney disease, stage 3 unspecified: Secondary | ICD-10-CM | POA: Diagnosis not present

## 2024-01-13 DIAGNOSIS — J4520 Mild intermittent asthma, uncomplicated: Secondary | ICD-10-CM | POA: Diagnosis not present

## 2024-01-13 DIAGNOSIS — I480 Paroxysmal atrial fibrillation: Secondary | ICD-10-CM | POA: Diagnosis not present

## 2024-01-13 DIAGNOSIS — Z556 Problems related to health literacy: Secondary | ICD-10-CM | POA: Diagnosis not present

## 2024-01-13 DIAGNOSIS — I872 Venous insufficiency (chronic) (peripheral): Secondary | ICD-10-CM | POA: Diagnosis not present

## 2024-01-18 ENCOUNTER — Other Ambulatory Visit: Payer: Self-pay

## 2024-01-18 ENCOUNTER — Encounter

## 2024-01-18 DIAGNOSIS — G894 Chronic pain syndrome: Secondary | ICD-10-CM

## 2024-01-18 MED ORDER — HYDROCODONE-ACETAMINOPHEN 10-325 MG PO TABS: 1.0000 | ORAL_TABLET | Freq: Four times a day (QID) | ORAL | 0 refills | Status: AC | PRN

## 2024-01-18 NOTE — Telephone Encounter (Signed)
 Alexis [name]

## 2024-01-18 NOTE — Telephone Encounter (Signed)
 Copied from CRM [id number]. Topic: Clinical - Medication Question >> Jan 18, 2024  3:22 PM Avram MATSU wrote: Reason for CRM: patient is calling for her HYDROcodone -acetaminophen  (NORCO) 10-325 MG tablet [[id number]] ENDED and was wondering if she can get a refill.   ----------------------------------------------------------------------- From previous Reason for Contact - Scheduling: Patient/patient representative is calling to schedule an appointment. Refer to attachments for appointment information. >> Jan 18, 2024  3:25 PM Avram MATSU wrote: Mychart or callback please

## 2024-01-18 NOTE — Telephone Encounter (Signed)
Refill of pain medication sent to pharmacy.

## 2024-01-20 ENCOUNTER — Other Ambulatory Visit: Payer: Self-pay

## 2024-01-20 ENCOUNTER — Other Ambulatory Visit: Payer: Self-pay | Admitting: Family Medicine

## 2024-01-20 ENCOUNTER — Ambulatory Visit

## 2024-01-20 ENCOUNTER — Other Ambulatory Visit: Payer: Self-pay | Admitting: Cardiology

## 2024-01-20 DIAGNOSIS — I872 Venous insufficiency (chronic) (peripheral): Secondary | ICD-10-CM

## 2024-01-20 NOTE — Telephone Encounter (Signed)
 Copied from CRM [id number]. Topic: Clinical - Medication Refill >> Jan 20, 2024 12:24 PM Lonell PEDLAR wrote: Medication:  digoxin  (LANOXIN ) 0.125 MG tablet  Has the patient contacted their pharmacy? No  This is the patient's preferred pharmacy:  [id number] - [place], [place] - [address] [facility] [address] [address] [place] [address] Phone: [phone] Fax: [phone]   Is this the correct pharmacy for this prescription? Yes If no, delete pharmacy and type the correct one.   Has the prescription been filled recently? Yes  Is the patient out of the medication? No  Has the patient been seen for an appointment in the last year OR does the patient have an upcoming appointment? Yes  Can we respond through MyChart? Yes  Agent: Please be advised that Rx refills may take up to 3 business days. We ask that you follow-up with your pharmacy.

## 2024-01-20 NOTE — Addendum Note (Signed)
 Addended by: SEWARD MAGNOLIA BROCKS on: 01/20/2024 05:05 PM   Modules accepted: Orders

## 2024-01-21 ENCOUNTER — Ambulatory Visit: Admitting: Internal Medicine

## 2024-01-21 DIAGNOSIS — J4540 Moderate persistent asthma, uncomplicated: Secondary | ICD-10-CM

## 2024-01-21 NOTE — Progress Notes (Deleted)
 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 Last seen  on April 13, 2006 with the  recommendation that she maintain Symbicort  at 160/4.5 two puffs b.i.d.  Take empiric Protonix at 40 mg b.i.d. before meals, which she failed to  do, and try Singulair  10 mg q.p.m. She said that Singulair  did  nothing for her, and stopped it after a couple of weeks but is  convinced that Symbicort  is helping, and that she is using less  albuterol  than normal. It turns out, however, that she is still using  albuterol  4 or 5 times a day. She states she does not typically wake up  at night and need it.   Pulmonary function tests were reviewed from January 22, and indicate an  FEV1 of only 56% predicted with a ratio of 53% and a 15% improvement  after bronchodilators. rec gerd rx plus symbicort  160 2bid > all symptoms resolved once learned technique    08/24/2013 1st  office visit/ Kayvon Mo   Off gerd rx/ on symbicort  160 2bid and ACEi now Chief Complaint  Patient presents with   Advice Only    Old MW pt to reestablish care for Asthma, sleep study.   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 Why wakes 8 am not refreshed funny in the head and feels drowsy 10- noon. Only drives 10 min and on trips gets drowsy but husband always drives her.  rec Stop lisinopril  Start micardis  80 mg one half daily  You need to breath clean air to reduce your risk of asthma flares  Read for 30 min before bed nightly - if you do wake up no light We will schedule you for a sleep study in one month     08/01/2016  Extended f/u ov/Jalexus Brett re:  dtc asthma on symb 160 2bid and avg saba once  daily  Chief Complaint  Patient presents with   Follow-up    4wk rov- pt states there was no improvment in breathing the first three weeks after last OV, pt had switched back to lisinopril  due to losartan  not controlling BP. pt reports occ non prod cough clear mucus & mild wheezing  thinks better not due to off acei but due to allergies  better since rained so changed back to acei instead of arb  Using saba q hs x early March 2018 which is a new issue for her and remains a problem, thinks may be due to noct HB rec Plan A = Automatic = Symbicort  80 Take 2 puffs first thing in am and then another 2 puffs about 12 hours later.  Plan B = Backup Only use your albuterol (ventolin )  Whenever cough/ wheeze for any reason or having heart burn >  Try prilosec otc 20mg   Take 30-60 min before first meal of the day and Pepcid  ac (famotidine ) 20 mg one @  bedtime until cough is completely gone for at least a week without the need for cough suppression GERD .  I f breathing/ wheezing / coughing getting worse on this regimen,  You  will either need to change back to an ARB (losartan  is the cheapest but may not be the best) and off lisinopril  or Let Dr Jeffrie or your Primary doctor refer you to another specialist for example an Allergist / asthma specialist in [facility] as there is nothing more I can do for you in this circumstance. Please schedule a follow up office visit in 4 weeks, sooner if needed with medication formulary > did not do   email 09/28/16: You were right about the Lisinopril  and for the most part I don't have to much congestion.  I still have some, but I still feel that symbicort  and albuterol  are handling it pretty good.  Not perfect, but pretty good.     11/13/2017  f/u ov/Dennice Tindol re: asthma with component of uacs flared off gerd rx  Chief Complaint  Patient presents with   Acute Visit    She c/o prod cough for the past 6 wks- clear to light yellow sputum.    Dyspnea:  Limited by knees >  sob  Cough: worse x 2 weeks since stopped ppi / nexium   SABA use: hardly ever 02: no   Now living part time in [place]  taking care of her mother  Rec Resume nexium  20 mg Take 30-60 min before first meal of the day  GERD diet reviewed, bed blocks rec  If breathing cough or congestion worse > Prednisone  10 mg take  4 each am x 2 days,   2 each am x 2 days,  1 each am x 2 days and stop    02/16/2019 Phone visit recs (living in [place] )  Patient has MyChart but needs to change insurance to [place]  Pt would like [place] & [place] info  If get worse > Prednisone  10 mg take  4 each am x 2 days,   2 each am x 2 days,  1 each am x 2 days and stop  For drainage / throat tickle try take CHLORPHENIRAMINE  4 mg   Other option for nasal symptoms, coughing = trial of singulair  as don't see any evidence in Epic that it has been tried.     04/14/2023 Re-establish ov/[place] office/Laurell Coalson re: asthma/ uacs  maint on [place]ella due to insurance and not well controlled    Chief Complaint  Patient presents with   Establish Care   Asthma  Dyspnea:  knees give out before breathing / rides scooter to shop/ uses walker 2 wheels at home  Cough: none but subjective wheeze day > noct  Sleeping: bed is flat/ one pillow s    resp cc  SABA use: has hfa not using  02: none  Rec Plan A = Automatic = Always=    Symbicort  160 (Breyna  160) Take 2 puffs first thing in am and then another 2 puffs about 12 hours later.  Work on inhaler technique:    Plan B = Backup (to supplement plan A, not to replace it) Only use your albuterol  inhaler as a rescue medication  Plan C = Crisis (instead of Plan B but only if Plan B stops working) - only use your albuterol  nebulizer if you first try Plan B Depomedrol 120 mg IM   Please schedule a follow up visit in 3 months but call sooner if needed - bring inhalers   Onset cough march 2025 and ? Pna dx rx doxy /amox/ pred    08/25/2023  f/u ov/[place] office/Bennet Kujawa re: symbicort  160  maint on  symbicort  160  did  not  bring inhalers  on prednisone  taper now at  10 mg x 3 each am   Chief Complaint  Patient presents with   Asthma  Dyspnea:  uses 2 wheeled walker at home but w/c to go out  Cough: not purulent  minimal vol  Sleeping: bed is flat/ 2 pillows with cough better with less saba noct  SABA use: neb before bed  02: none  Rec Plan A = Automatic = Always=    Symbicort  80 Take 2 puffs first thing in am and then another 2 puffs about 12 hours later.   Plan B = Backup (to supplement plan A, not to replace it) Only use your albuterol  inhaler as a rescue medication Plan C = Crisis (instead of Plan B but only if Plan B stops working) - only use your albuterol  nebulizer if you first try Plan B Finish prednisone   For cough/ congestion >  mucinex dm up to maximum of  1200 mg every 12 hours and use the flutter valve as much as you can   GERD diet reviewed, bed blocks rec   Please schedule a follow up office visit in 6 weeks, call sooner if needed with inhalers    10/06/2023  f/u ov/[place] office/Joscelynn Brutus re: asthma  cannot afford symbicort  / did not bring any meds  Chief Complaint  Patient presents with   Follow-up   Asthma  Dyspnea:  2 wheeled walker in house, use w/c to go out  Cough: mucus is grey and cough is much worse since May 2025 = harsh barking  Sleeping: bed is flat/ 2 pillows s    resp cc  SABA use: not using per abc plan nor using flutter mucinex or ppi  Rec For cough/ congestion >  mucinex dm up to maximum of  1200 mg every 12 hours and use the flutter valve as much as you can  Aciphex  20 mg Take 30-60 min before first meal of the day and pepcid  20 mg after supper until return  GERD diet  Try albuterol  2.5 mg with budesonide  0.25 mg mixed together twice daily  In between ok to use albuterol  up to every 4 hours if needed Please schedule a follow up office visit in 4 weeks, sooner if needed  with all medications /inhalers/ solutions in hand    01/21/2024  f/u  ov/[place] office/Keighley Deckman re: *** maint on *** did *** bring inhalers  No chief complaint on file.   Dyspnea:  *** Cough: *** Sleeping: ***   resp cc  SABA use: *** 02: ***  Lung cancer screening: ***   No obvious day to day or daytime variability or assoc excess/ purulent sputum or mucus plugs or hemoptysis or cp or chest tightness, subjective wheeze or overt sinus or hb symptoms.    Also denies any obvious fluctuation of symptoms with weather or environmental changes or other aggravating or alleviating factors except as outlined above   No unusual exposure hx or h/o childhood pna/ asthma or knowledge of premature birth.  Current Allergies, Complete Past Medical History, Past Surgical History, Family History, and Social History were reviewed in [employer] record.  ROS  The following are not active complaints unless bolded Hoarseness, sore throat, dysphagia, dental problems, itching, sneezing,  nasal congestion or discharge of excess mucus or purulent secretions, ear ache,   fever, chills, sweats, unintended wt loss or wt gain, classically pleuritic or exertional cp,  orthopnea pnd or arm/hand swelling  or leg swelling,  presyncope, palpitations, abdominal pain, anorexia, nausea, vomiting, diarrhea  or change in bowel habits or change in bladder habits, change in stools or change in urine, dysuria, hematuria,  rash, arthralgias, visual complaints, headache, numbness, weakness or ataxia or problems with walking or coordination,  change in mood or  memory.        No outpatient medications have been marked as taking for the 01/21/24 encounter (Appointment) with Darlean Ozell NOVAK, MD.                   Objective:   Physical Exam   01/21/2024     ***  08/25/2023       329  04/14/2023         324  (BMI 55)   11/13/2017         324  04/22/2017        324  11/07/2016          329  08/01/2016        335  07/01/2016        336   08/24/13 361 lb (163.749 kg)  08/19/13 359  lb (162.841 kg)  03/08/13 369 lb (167.377 kg)      Vital signs reviewed  01/21/2024  - Note at rest 02 sats  ***% on ***   General appearance:    ***       bilteral lymphedema    LE venous statis changes bilaterally  ***  Assessment & Plan:

## 2024-01-25 ENCOUNTER — Ambulatory Visit: Admitting: Internal Medicine

## 2024-01-26 ENCOUNTER — Ambulatory Visit: Attending: Cardiology

## 2024-01-26 ENCOUNTER — Other Ambulatory Visit: Payer: Self-pay

## 2024-01-26 ENCOUNTER — Telehealth: Payer: Self-pay

## 2024-01-26 DIAGNOSIS — L299 Pruritus, unspecified: Secondary | ICD-10-CM

## 2024-01-26 DIAGNOSIS — G894 Chronic pain syndrome: Secondary | ICD-10-CM

## 2024-01-26 MED ORDER — HYDROXYZINE PAMOATE 25 MG PO CAPS: 25.0000 mg | ORAL_CAPSULE | Freq: Four times a day (QID) | ORAL | 5 refills | Status: AC | PRN

## 2024-01-26 MED ORDER — HYDROCODONE-ACETAMINOPHEN 10-325 MG PO TABS: 1.0000 | ORAL_TABLET | Freq: Four times a day (QID) | ORAL | 0 refills | Status: DC | PRN

## 2024-01-26 NOTE — Telephone Encounter (Signed)
 Alexis [name]

## 2024-01-26 NOTE — Telephone Encounter (Signed)
 I sent in additional pain medication.

## 2024-01-26 NOTE — Telephone Encounter (Signed)
 Verbal orders provided to [facility]

## 2024-01-26 NOTE — Telephone Encounter (Signed)
 Noted

## 2024-01-26 NOTE — Telephone Encounter (Signed)
 [facility] [place] [facility]

## 2024-01-26 NOTE — Telephone Encounter (Signed)
 Copied from CRM #[id number]. Topic: Clinical - Home Health Verbal Orders >> Jan 26, 2024  1:34 PM Emylou G wrote: Caller/Agency: Pam w/[facility] Callback Number: [phone] Pams cell Service Requested: said needs orders to treat her legs..both legs have open wounds and swellling - weeping a lot.. Also needs okay to put in umma boots for both legs Frequency: twice a week for a few weeks then once a week Any new concerns about the patient? Has a lot of itching with her legs - needs suggestion, but the boots should help

## 2024-01-27 ENCOUNTER — Other Ambulatory Visit: Payer: Self-pay | Admitting: Family Medicine

## 2024-01-28 ENCOUNTER — Ambulatory Visit: Admitting: Internal Medicine

## 2024-02-01 ENCOUNTER — Ambulatory Visit: Admitting: Family Medicine

## 2024-02-02 NOTE — Progress Notes (Signed)
 Alexis [name]                                          MRN: [id number]   02/02/2024   The VBCI Quality Team Specialist reviewed this patient medical record for the purposes of chart review for care gap closure. The following were reviewed: abstraction for care gap closure-glycemic status assessment.Alexis [name]                                          MRN: [id number]   02/03/2024   The VBCI Quality Team Specialist reviewed this patient medical record for the purposes of chart review for care gap closure. The following were reviewed: chart review for care gap closure-kidney health evaluation for diabetes:eGFR  and uACR.    VBCI Quality Team

## 2024-02-04 ENCOUNTER — Encounter: Payer: Self-pay | Admitting: Internal Medicine

## 2024-02-05 ENCOUNTER — Other Ambulatory Visit: Payer: Self-pay

## 2024-02-05 MED ORDER — PREDNISONE 10 MG PO TABS: 10.0000 mg | ORAL_TABLET | Freq: Every day | ORAL | 0 refills | Status: DC

## 2024-02-08 ENCOUNTER — Telehealth: Payer: Self-pay

## 2024-02-08 NOTE — Telephone Encounter (Signed)
 Copied from CRM [id number]. Topic: Clinical - Home Health Verbal Orders >> Feb 08, 2024  3:20 PM Darshell M wrote: Caller/Agency: Ellouise with [facility] Callback Number: [phone] Service Requested: Want to add calcium  alginate then wrap with unnaboot, purlex, and coban Frequency: 2x per week Any new concerns about the patient? Yes Additional blisters, increased weeping coming through the bandages.

## 2024-02-09 ENCOUNTER — Encounter: Payer: Self-pay | Admitting: Internal Medicine

## 2024-02-09 ENCOUNTER — Ambulatory Visit (INDEPENDENT_AMBULATORY_CARE_PROVIDER_SITE_OTHER): Admitting: Internal Medicine

## 2024-02-09 VITALS — BP 122/78 | HR 91 | Ht 64.0 in | Wt 243.0 lb

## 2024-02-09 DIAGNOSIS — E1169 Type 2 diabetes mellitus with other specified complication: Secondary | ICD-10-CM | POA: Diagnosis not present

## 2024-02-09 DIAGNOSIS — L03116 Cellulitis of left lower limb: Secondary | ICD-10-CM

## 2024-02-09 DIAGNOSIS — J45909 Unspecified asthma, uncomplicated: Secondary | ICD-10-CM

## 2024-02-09 DIAGNOSIS — I480 Paroxysmal atrial fibrillation: Secondary | ICD-10-CM

## 2024-02-09 DIAGNOSIS — I10 Essential (primary) hypertension: Secondary | ICD-10-CM

## 2024-02-09 DIAGNOSIS — Z23 Encounter for immunization: Secondary | ICD-10-CM | POA: Diagnosis not present

## 2024-02-09 DIAGNOSIS — Z7985 Long-term (current) use of injectable non-insulin antidiabetic drugs: Secondary | ICD-10-CM

## 2024-02-09 DIAGNOSIS — M7989 Other specified soft tissue disorders: Secondary | ICD-10-CM

## 2024-02-09 DIAGNOSIS — E1159 Type 2 diabetes mellitus with other circulatory complications: Secondary | ICD-10-CM | POA: Diagnosis not present

## 2024-02-09 MED ORDER — OZEMPIC (0.25 OR 0.5 MG/DOSE) 2 MG/3ML ~~LOC~~ SOPN: 0.5000 mg | PEN_INJECTOR | SUBCUTANEOUS | 0 refills | Status: AC

## 2024-02-09 MED ORDER — FUROSEMIDE 40 MG PO TABS: 40.0000 mg | ORAL_TABLET | Freq: Every day | ORAL | 3 refills | Status: AC

## 2024-02-09 MED ORDER — CEPHALEXIN 500 MG PO CAPS: 500.0000 mg | ORAL_CAPSULE | Freq: Three times a day (TID) | ORAL | 0 refills | Status: DC

## 2024-02-09 NOTE — Patient Instructions (Signed)
 Please start taking Cephalexin  as prescribed.  Please start taking Ozempic  0.5 mg once weekly.  Please take Furosemide  (Lasix ) once daily for now.  Please continue to take medications as prescribed.  Please continue to follow low carb diet and ambulate as tolerated.

## 2024-02-09 NOTE — Assessment & Plan Note (Signed)
 Rate controlled with metoprolol , diltiazem  and digoxin  On Coumadin  for [facility] Followed by cardiology

## 2024-02-09 NOTE — Assessment & Plan Note (Signed)
 Has blisters over bilateral legs Left leg lesion concerning for cellulitis Started empiric Keflex  Continue local wound care through home health

## 2024-02-09 NOTE — Assessment & Plan Note (Signed)
 Better with recent prednisone  She asked for refill of prednisone , advised to avoid it for now considering his recent worsening of bilateral leg swelling and her history of type II DM Continue Symbicort  regularly and use albuterol  inhaler as needed for dyspnea or wheezing Promethazine -DM syrup as needed for cough

## 2024-02-09 NOTE — Assessment & Plan Note (Signed)
 Likely multifactorial, due to chronic venous insufficiency, HFpEF Recent worsening likely due to oral prednisone  Advised to continue wrap bandage - followed by Home health Advised to perform leg elevation as tolerated Advised to take Lasix  40 mg QD regularly for now

## 2024-02-09 NOTE — Assessment & Plan Note (Addendum)
 Lab Results  Component Value Date   HGBA1C 6.4 (H) 07/30/2023   Associated with HTN, HLD Has been overall well-controlled, but had to take oral prednisone  recently Started Ozempic  0.5 mg qw, considering his cardiac benefits -plan to increase dose as tolerated Advised to follow diabetic diet - small, frequent meals while on Ozempic  On statin and ARB F/u CMP and lipid panel Diabetic eye exam: Advised to follow up with Ophthalmology for diabetic eye exam

## 2024-02-09 NOTE — Assessment & Plan Note (Signed)
 BP Readings from Last 1 Encounters:  02/09/24 122/78   Well-controlled Counseled for compliance with the medications Advised DASH diet and moderate exercise/walking as tolerated

## 2024-02-09 NOTE — Progress Notes (Signed)
 Established Patient Office Visit  Subjective:  Patient ID: Alexis [name], female    DOB: March 07, 1956  Age: 68 y.o. MRN: [id number]  CC:  Chief Complaint  Patient presents with   Medication Refill    Needs refill.   Skin Problem    Has boils on her legs.     HPI Alexis [name] is a 68 y.o. female with past medical history of HTN, A-fib, asthma, GERD, type II DM, chronic leg swelling and OA of knee who presents for f/u of her DM and evaluation of bilateral leg swelling.  Type II DM: Her last HbA1c was 6.4 in 04/25.  She was given Ozempic  by PCP in the last visit, but she had not started it considering its side effects.  I had lengthy discussion about potential benefits of Ozempic  in her case, she agrees to start it now.  Of note, she has had to take prednisone  for asthma exacerbations.  Leg swelling: She has chronic leg swelling, which is worse for the last 2 weeks.  Of note, she was on prednisone  for asthma exacerbation. She has bilateral leg wrapped bandage for leg swelling.  She had Lasix  40 mg, but has not been taking it regularly.  She has noticed blisters over bilateral legs and has weeping edema.  Denies any recent worsening of dyspnea.  Past Medical History:  Diagnosis Date   Asthma 01/21/2013   Dr. Darlean   Endometrial cancer [facility]) 01/21/2013   Dr. Arlee, stage I-10/14   HTN (hypertension)    Morbid obesity (HCC)    Paroxysmal A-fib (HCC)    Uterine cancer (HCC)     Past Surgical History:  Procedure Laterality Date   ABLATION     APPENDECTOMY     DILATION AND CURETTAGE OF UTERUS     NASAL SINUS SURGERY      Family History  Problem Relation Age of Onset   Heart attack Father    Hypertension Father    Diabetes Father    Hyperlipidemia Unknown     Social History   Socioeconomic History   Marital status: Married    Spouse name: Not on file   Number of children: Not on file   Years of education: Not on file   Highest education level: Not on file   Occupational History   Not on file  Tobacco Use   Smoking status: Never   Smokeless tobacco: Never  Substance and Sexual Activity   Alcohol use: No   Drug use: Not on file   Sexual activity: Not on file  Other Topics Concern   Not on file  Social History Narrative   Not on file   Social Drivers of Health   Financial Resource Strain: High Risk (02/19/2023)   Overall Financial Resource Strain (CARDIA)    Difficulty of Paying Living Expenses: Hard  Food Insecurity: No Food Insecurity (02/19/2023)   Hunger Vital Sign    Worried About Running Out of Food in the Last Year: Never true    Ran Out of Food in the Last Year: Never true  Transportation Needs: No Transportation Needs (02/19/2023)   PRAPARE - Administrator, Civil Service (Medical): No    Lack of Transportation (Non-Medical): No  Physical Activity: Inactive (02/19/2023)   Exercise Vital Sign    Days of Exercise per Week: 0 days    Minutes of Exercise per Session: 0 min  Stress: No Stress Concern Present (02/19/2023)   [employer] of Occupational Health - Occupational  Stress Questionnaire    Feeling of Stress : Not at all  Social Connections: Socially Integrated (02/19/2023)   Social Connection and Isolation Panel    Frequency of Communication with Friends and Family: More than three times a week    Frequency of Social Gatherings with Friends and Family: More than three times a week    Attends Religious Services: More than 4 times per year    Active Member of [employer] or Organizations: Yes    Attends Engineer, Structural: More than 4 times per year    Marital Status: Married  Catering Manager Violence: Not At Risk (02/19/2023)   Humiliation, Afraid, Rape, and Kick questionnaire    Fear of Current or Ex-Partner: No    Emotionally Abused: No    Physically Abused: No    Sexually Abused: No    Outpatient Medications Prior to Visit  Medication Sig Dispense Refill   Accu-Chek Softclix Lancets  lancets      acetaminophen  (TYLENOL ) 500 MG tablet Take 500 mg by mouth 3 times/day as needed-between meals & bedtime for moderate pain. Pt takes 1000 mg 2 tablet bid     albuterol  (PROVENTIL ) (2.5 MG/3ML) 0.083% nebulizer solution Take 3 mLs (2.5 mg total) by nebulization every 6 (six) hours as needed for wheezing or shortness of breath. 75 mL 5   albuterol  (VENTOLIN  HFA) 108 (90 Base) MCG/ACT inhaler Inhale 2 puffs into the lungs every 6 (six) hours as needed for wheezing or shortness of breath. 18 each 2   Blood Glucose Monitoring Suppl DEVI 1 each by Does not apply route in the morning, at noon, and at bedtime. May substitute to any manufacturer covered by patient's insurance. 1 each 0   budesonide  (PULMICORT ) 0.25 MG/2ML nebulizer solution Take 2 mLs (0.25 mg total) by nebulization daily. 60 mL 12   budesonide -formoterol  (SYMBICORT ) 80-4.5 MCG/ACT inhaler Take 2 puffs first thing in am and then another 2 puffs about 12 hours later. 1 each 12   budesonide -glycopyrrolate-formoterol  (BREZTRI  AEROSPHERE) 160-9-4.8 MCG/ACT AERO inhaler Inhale 2 puffs into the lungs in the morning and at bedtime.     Calcium  Carbonate (CALCIUM  600 PO) Take by mouth. 2 chewables by mouth daily- Bariatric fusion     Cholecalciferol 1.25 MG (50000 UT) capsule Take by mouth.     clotrimazole  (CLOTRIMAZOLE  3) 2 % vaginal cream Apply intravaginally once daily for 3 days 21 g 0   digoxin  (LANOXIN ) 0.125 MG tablet TAKE 1 TABLET BY MOUTH EVERY DAY 15 tablet 0   diltiazem  (CARDIZEM  CD) 360 MG 24 hr capsule Take 1 capsule (360 mg total) by mouth daily. 90 capsule 3   HYDROcodone -acetaminophen  (NORCO) 10-325 MG tablet Take 1 tablet by mouth every 6 (six) hours as needed. 60 tablet 0   hydrOXYzine  (VISTARIL ) 25 MG capsule Take 1 capsule (25 mg total) by mouth every 6 (six) hours as needed for itching. 90 capsule 5   levocetirizine (XYZAL ) 5 MG tablet Take 1 tablet (5 mg total) by mouth every evening. 30 tablet 1   losartan   (COZAAR ) 100 MG tablet TAKE 1 TABLET BY MOUTH EVERY DAY 90 tablet 0   metoprolol  succinate (TOPROL -XL) 25 MG 24 hr tablet TAKE 1 TABLET BY MOUTH TWICE A DAY 60 tablet 2   montelukast  (SINGULAIR ) 10 MG tablet TAKE 1 TABLET BY MOUTH EVERY DAY 90 tablet 0   Multiple Vitamin (MULTIVITAMIN) tablet Take 1 tablet by mouth daily. Bariatric fusion     mupirocin  ointment (BACTROBAN ) 2 % Apply  1 Application topically 2 (two) times daily. 22 g 0   potassium chloride  SA (KLOR-CON  M) 20 MEQ tablet TAKE 1 TABLET BY MOUTH EVERY DAY 30 tablet 1   predniSONE  (DELTASONE ) 10 MG tablet Take 1 tablet (10 mg total) by mouth daily with breakfast. 10 mg take  4 each am x 2 days,   2 each am x 2 days,  1 each am x 2 days and stop 14 tablet 0   promethazine -dextromethorphan (PROMETHAZINE -DM) 6.25-15 MG/5ML syrup Take 5 mLs by mouth 4 (four) times daily as needed. 118 mL 0   RABEprazole  (ACIPHEX ) 20 MG tablet TAKE 1 TABLET BY MOUTH EVERY DAY 30 tablet 2   rosuvastatin  (CRESTOR ) 5 MG tablet TAKE 1 TABLET (5 MG TOTAL) BY MOUTH DAILY. 90 tablet 3   TIADYLT  ER 240 MG 24 hr capsule Take by mouth daily.     warfarin (COUMADIN ) 5 MG tablet TAKE 1 TABLET DAILY EXCEPT 1 1/2 TABLETS ON TUESDAYS, THURSDAYS AND SATURDAYS OR AS DIRECTED 40 tablet 5   famotidine  (PEPCID ) 20 MG tablet TAKE 1 TABLET BY MOUTH TWICE A DAY 45 tablet 3   furosemide  (LASIX ) 40 MG tablet TAKE 1 TABLET BY MOUTH EVERY DAY 30 tablet 1   predniSONE  (DELTASONE ) 10 MG tablet 4 tabs for 2 days, then 3 tabs for 2 days, 2 tabs for 2 days, then 1 tab for 2 days, then stop 20 tablet 0   Semaglutide ,0.25 or 0.5MG /DOS, (OZEMPIC , 0.25 OR 0.5 MG/DOSE,) 2 MG/3ML SOPN Inject 0.25 mg into the skin once a week. 3 mL 0   No facility-administered medications prior to visit.    Allergies  Allergen Reactions   Ace Inhibitors     Cough / congestion   Ciprofloxacin    Cocoa    Flavoring Agent (Non-Screening)     Makes mouth numb   Gabapentin Nausea And Vomiting   Prednisone   Other (See Comments)    Raises her blood pressure   Sulfa Antibiotics Nausea And Vomiting    Other reaction(s): Nausea And Vomiting   Sulfamethoxazole Nausea Only    ROS Review of Systems  Constitutional:  Negative for chills and fever.  HENT:  Negative for congestion and sore throat.   Eyes:  Negative for pain and discharge.  Respiratory:  Positive for cough. Negative for shortness of breath.   Cardiovascular:  Positive for leg swelling. Negative for chest pain and palpitations.  Gastrointestinal:  Negative for abdominal pain, diarrhea, nausea and vomiting.  Endocrine: Negative for polydipsia and polyuria.  Genitourinary:  Negative for dysuria and hematuria.  Musculoskeletal:  Positive for back pain and gait problem. Negative for neck pain and neck stiffness.  Skin:  Negative for rash.  Neurological:  Positive for weakness. Negative for dizziness.  Psychiatric/Behavioral:  Negative for agitation and behavioral problems.       Objective:    Physical Exam Vitals reviewed.  Constitutional:      General: She is not in acute distress.    Appearance: She is obese. She is not diaphoretic.  HENT:     Head: Normocephalic and atraumatic.     Nose: Nose normal. No congestion.     Mouth/Throat:     Mouth: Mucous membranes are moist.     Pharynx: No posterior oropharyngeal erythema.  Eyes:     General: No scleral icterus.    Extraocular Movements: Extraocular movements intact.  Cardiovascular:     Rate and Rhythm: Normal rate and regular rhythm.     Heart sounds:  Normal heart sounds. No murmur heard. Pulmonary:     Breath sounds: No wheezing or rales.  Musculoskeletal:     Cervical back: Neck supple. No tenderness.     Right lower leg: Edema (2+) present.     Left lower leg: Edema (2+) present.  Skin:    General: Skin is warm.     Findings: Lesion (Blister over left leg, about 4 cm in diameter with surrounding erythema) present. No rash.  Neurological:     General: No focal  deficit present.     Mental Status: She is alert and oriented to person, place, and time.  Psychiatric:        Mood and Affect: Mood normal.        Behavior: Behavior normal.     BP 122/78   Pulse 91   Ht 5' 4 (1.626 m)   Wt 243 lb (110.2 kg)   SpO2 96%   BMI 41.71 kg/m  Wt Readings from Last 3 Encounters:  02/09/24 243 lb (110.2 kg)  12/04/23 (!) 351 lb (159.2 kg)  10/06/23 (!) 342 lb (155.1 kg)    Lab Results  Component Value Date   TSH 1.610 07/30/2023   Lab Results  Component Value Date   WBC 7.8 07/30/2023   HGB 14.5 07/30/2023   HCT 43.6 07/30/2023   MCV 90 07/30/2023   PLT 257 07/30/2023   Lab Results  Component Value Date   NA 140 12/04/2023   K 4.5 12/04/2023   CO2 24 12/04/2023   GLUCOSE 130 (H) 12/04/2023   BUN 9 12/04/2023   CREATININE 0.78 12/04/2023   BILITOT 0.4 07/30/2023   ALKPHOS 127 (H) 07/30/2023   AST 13 07/30/2023   ALT 13 07/30/2023   PROT 6.4 07/30/2023   ALBUMIN 3.9 07/30/2023   CALCIUM  8.9 12/04/2023   ANIONGAP 7 01/19/2017   EGFR 83 12/04/2023   GFR 63.43 05/23/2014   Lab Results  Component Value Date   CHOL 158 07/30/2023   Lab Results  Component Value Date   HDL 42 07/30/2023   Lab Results  Component Value Date   LDLCALC 96 07/30/2023   Lab Results  Component Value Date   TRIG 108 07/30/2023   Lab Results  Component Value Date   CHOLHDL 3.8 07/30/2023   Lab Results  Component Value Date   HGBA1C 6.4 (H) 07/30/2023      Assessment & Plan:   Problem List Items Addressed This Visit       Cardiovascular and Mediastinum   Paroxysmal A-fib (HCC)   Rate controlled with metoprolol , diltiazem  and digoxin  On Coumadin  for [facility] Followed by cardiology      Relevant Medications   furosemide  (LASIX ) 40 MG tablet   Other Relevant Orders   CBC with Differential/Platelet   B Nat Peptide   Essential hypertension   BP Readings from Last 1 Encounters:  02/09/24 122/78   Well-controlled Counseled for compliance  with the medications Advised DASH diet and moderate exercise/walking as tolerated      Relevant Medications   furosemide  (LASIX ) 40 MG tablet     Respiratory   Acute asthmatic bronchitis   Better with recent prednisone  She asked for refill of prednisone , advised to avoid it for now considering his recent worsening of bilateral leg swelling and her history of type II DM Continue Symbicort  regularly and use albuterol  inhaler as needed for dyspnea or wheezing Promethazine -DM syrup as needed for cough        Endocrine  Type 2 diabetes mellitus with other specified complication (HCC) - Primary   Lab Results  Component Value Date   HGBA1C 6.4 (H) 07/30/2023   Associated with HTN, HLD Has been overall well-controlled, but had to take oral prednisone  recently Started Ozempic  0.5 mg qw, considering his cardiac benefits -plan to increase dose as tolerated Advised to follow diabetic diet - small, frequent meals while on Ozempic  On statin and ARB F/u CMP and lipid panel Diabetic eye exam: Advised to follow up with Ophthalmology for diabetic eye exam      Relevant Medications   Semaglutide ,0.25 or 0.5MG /DOS, (OZEMPIC , 0.25 OR 0.5 MG/DOSE,) 2 MG/3ML SOPN   Other Relevant Orders   CMP14+EGFR   Hemoglobin A1c     Other   Leg swelling   Likely multifactorial, due to chronic venous insufficiency, HFpEF Recent worsening likely due to oral prednisone  Advised to continue wrap bandage - followed by Home health Advised to perform leg elevation as tolerated Advised to take Lasix  40 mg QD regularly for now      Relevant Medications   furosemide  (LASIX ) 40 MG tablet   Other Relevant Orders   B Nat Peptide   Cellulitis of left lower extremity   Has blisters over bilateral legs Left leg lesion concerning for cellulitis Started empiric Keflex  Continue local wound care through home health      Relevant Medications   cephALEXin  (KEFLEX ) 500 MG capsule   Other Visit Diagnoses        Encounter for immunization       Relevant Orders   Flu vaccine HIGH DOSE PF(Fluzone Trivalent) (Completed)       Meds ordered this encounter  Medications   cephALEXin  (KEFLEX ) 500 MG capsule    Sig: Take 1 capsule (500 mg total) by mouth 3 (three) times daily.    Dispense:  21 capsule    Refill:  0   Semaglutide ,0.25 or 0.5MG /DOS, (OZEMPIC , 0.25 OR 0.5 MG/DOSE,) 2 MG/3ML SOPN    Sig: Inject 0.5 mg into the skin once a week.    Dispense:  3 mL    Refill:  0   furosemide  (LASIX ) 40 MG tablet    Sig: Take 1 tablet (40 mg total) by mouth daily.    Dispense:  30 tablet    Refill:  3    Follow-up: Return in about 1 month (around 03/10/2024) for DM.    Suzzane MARLA Blanch, MD

## 2024-02-10 ENCOUNTER — Ambulatory Visit: Admitting: Internal Medicine

## 2024-02-10 ENCOUNTER — Ambulatory Visit: Payer: Self-pay | Admitting: Internal Medicine

## 2024-02-11 LAB — HEMOGLOBIN A1C
Est. average glucose Bld gHb Est-mCnc: 166 mg/dL
Hgb A1c MFr Bld: 7.4 % — ABNORMAL HIGH (ref 4.8–5.6)

## 2024-02-11 LAB — CMP14+EGFR
ALT: 16 IU/L (ref 0–32)
AST: 18 IU/L (ref 0–40)
Albumin: 4 g/dL (ref 3.9–4.9)
Alkaline Phosphatase: 135 IU/L (ref 49–135)
BUN/Creatinine Ratio: 22 (ref 12–28)
BUN: 19 mg/dL (ref 8–27)
Bilirubin Total: 0.4 mg/dL (ref 0.0–1.2)
CO2: 23 mmol/L (ref 20–29)
Calcium: 9.2 mg/dL (ref 8.7–10.3)
Chloride: 101 mmol/L (ref 96–106)
Creatinine, Ser: 0.87 mg/dL (ref 0.57–1.00)
Globulin, Total: 2.8 g/dL (ref 1.5–4.5)
Glucose: 276 mg/dL — ABNORMAL HIGH (ref 70–99)
Potassium: 4.3 mmol/L (ref 3.5–5.2)
Sodium: 141 mmol/L (ref 134–144)
Total Protein: 6.8 g/dL (ref 6.0–8.5)
eGFR: 73 mL/min/1.73 (ref >59)

## 2024-02-11 LAB — CBC WITH DIFFERENTIAL/PLATELET
Basophils Absolute: 0.1 x10E3/uL (ref 0.0–0.2)
Basos: 0 %
EOS (ABSOLUTE): 0.1 x10E3/uL (ref 0.0–0.4)
Eos: 0 %
Hematocrit: 44.8 % (ref 34.0–46.6)
Hemoglobin: 13.9 g/dL (ref 11.1–15.9)
Immature Grans (Abs): 0 x10E3/uL (ref 0.0–0.1)
Immature Granulocytes: 0 %
Lymphocytes Absolute: 1.1 x10E3/uL (ref 0.7–3.1)
Lymphs: 9 %
MCH: 28.3 pg (ref 26.6–33.0)
MCHC: 31 g/dL — ABNORMAL LOW (ref 31.5–35.7)
MCV: 91 fL (ref 79–97)
Monocytes Absolute: 0.4 x10E3/uL (ref 0.1–0.9)
Monocytes: 3 %
Neutrophils Absolute: 10.7 x10E3/uL — ABNORMAL HIGH (ref 1.4–7.0)
Neutrophils: 88 %
Platelets: 297 x10E3/uL (ref 150–450)
RBC: 4.91 x10E6/uL (ref 3.77–5.28)
RDW: 13.4 % (ref 11.7–15.4)
WBC: 12.3 x10E3/uL — ABNORMAL HIGH (ref 3.4–10.8)

## 2024-02-11 LAB — BRAIN NATRIURETIC PEPTIDE: BNP: 110.9 pg/mL — AB (ref 0.0–100.0)

## 2024-02-16 ENCOUNTER — Ambulatory Visit: Admitting: Internal Medicine

## 2024-02-16 ENCOUNTER — Ambulatory Visit

## 2024-02-17 ENCOUNTER — Ambulatory Visit (INDEPENDENT_AMBULATORY_CARE_PROVIDER_SITE_OTHER): Admitting: Internal Medicine

## 2024-02-17 ENCOUNTER — Encounter: Payer: Self-pay | Admitting: Internal Medicine

## 2024-02-17 VITALS — BP 128/83 | HR 81 | Ht 64.0 in | Wt 350.6 lb

## 2024-02-17 DIAGNOSIS — R058 Other specified cough: Secondary | ICD-10-CM | POA: Diagnosis not present

## 2024-02-17 DIAGNOSIS — Z6841 Body Mass Index (BMI) 40.0 and over, adult: Secondary | ICD-10-CM | POA: Diagnosis not present

## 2024-02-17 DIAGNOSIS — J4540 Moderate persistent asthma, uncomplicated: Secondary | ICD-10-CM | POA: Diagnosis not present

## 2024-02-17 MED ORDER — PREDNISONE 10 MG PO TABS: ORAL_TABLET | ORAL | 0 refills | Status: AC

## 2024-02-17 NOTE — Progress Notes (Addendum)
 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 Last seen  on April 13, 2006 with the  recommendation that she maintain Symbicort  at 160/4.5 two puffs b.i.d.  Take empiric Protonix at 40 mg b.i.d. before meals, which she failed to  do, and try Singulair  10 mg q.p.m. She said that Singulair  did  nothing for her, and stopped it after a couple of weeks but is  convinced that Symbicort  is helping, and that she is using less  albuterol  than normal. It turns out, however, that she is still using  albuterol  4 or 5 times a day. She states she does not typically wake up  at night and need it.   Pulmonary function tests were reviewed from January 22, and indicate an  FEV1 of only 56% predicted with a ratio of 53% and a 15% improvement  after bronchodilators. rec gerd rx plus symbicort  160 2bid > all symptoms resolved once learned technique    email 09/28/16: You were right about the Lisinopril  and for the most part I don't have to much congestion.  I still have some, but I still feel that symbicort  and albuterol  are handling it pretty good.  Not perfect, but pretty good.    02/16/2019 Phone visit recs (living in [place] )  Patient has MyChart but needs to change insurance to [employer]  Pt would like [place] & [place] info  If get worse > Prednisone  10 mg take  4 each am x 2 days,   2 each am x 2 days,  1 each am x 2 days and stop  For drainage / throat tickle try take CHLORPHENIRAMINE  4 mg   Other option for nasal symptoms, coughing = trial of singulair  as don't see any evidence in Epic that it has been tried.       08/25/2023  f/u ov/[place] office/Yao Hyppolite re: symbicort  160  maint on symbicort  160  did not   bring inhalers  on prednisone  taper now at  10 mg x 3 each am   Chief Complaint  Patient presents with   Asthma  Dyspnea:  uses 2 wheeled walker at home but w/c to go out  Cough: not purulent  minimal vol  Sleeping: bed is flat/ 2 pillows with cough better with less saba noct  SABA use: neb before bed  02: none  Rec Plan A = Automatic = Always=    Symbicort  80 Take 2 puffs first thing in am and then another 2 puffs about 12 hours later.   Plan B = Backup (to supplement plan A, not to replace it) Only use your albuterol  inhaler as a rescue medication Plan C = Crisis (instead of Plan B but only if Plan B stops working) - only use your albuterol  nebulizer if you first try Plan B Finish prednisone   For cough/ congestion >  mucinex dm up to maximum of  1200 mg every 12 hours and use the flutter valve as much as you can   GERD diet reviewed, bed blocks rec   Please schedule a follow up office visit in 6 weeks, call sooner if needed with inhalers    10/06/2023  f/u ov/[place] office/Davonna Ertl re: asthma  cannot afford symbicort  / did not bring any meds  Chief Complaint  Patient presents with   Follow-up   Asthma  Dyspnea:  2 wheeled walker in house, use w/c to go out  Cough: mucus is grey and cough is much worse since May 2025 = harsh barking  Sleeping: bed is flat/ 2 pillows s  resp cc  SABA use: not using per abc plan nor using flutter mucinex or ppi  Rec For cough/ congestion >  mucinex dm up to maximum of  1200 mg every 12 hours and use the flutter valve as much as you can  Aciphex  20 mg Take 30-60 min before first meal of the day and pepcid  20 mg after supper until return  GERD diet  Try albuterol  2.5 mg with budesonide  0.25 mg mixed together twice daily  In between ok to use albuterol  up to every 4 hours if needed     02/17/2024  f/u ov/[place] office/Perla Echavarria re:   asthma vs uacs/vcd did not  bring inhalers / not using saba neb as rec Chief Complaint  Patient presents with    Asthma    Cough with mucus unsure of color  doe  Dyspnea:  w/c bound due to legs in ace wraps / wound issues  / uses  foot bicycle for exercise but really not to point of sob Cough: minimal production / assoc nasal d/c sometimes green, on abx for leg wound. Sleeping: recliner  @ 45 degrees no  resp cc  SABA use: hfa couple times a day , no neb  02: none   No obvious day to day or daytime variability or assoc  mucus plugs or hemoptysis or cp or chest tightness, subjective wheeze or overt sinus or hb symptoms.    Also denies any obvious fluctuation of symptoms with weather or environmental changes or other aggravating or alleviating factors except as outlined above   No unusual exposure hx or h/o childhood pna  or knowledge of premature birth.  Current Allergies, Complete Past Medical History, Past Surgical History, Family History, and Social History were reviewed in [employer] record.  ROS  The following are not active complaints unless bolded Hoarseness, sore throat, dysphagia, dental problems, itching, sneezing,  nasal congestion or discharge of excess mucus or purulent secretions, ear ache,   fever, chills, sweats, unintended wt loss or wt gain, classically pleuritic or exertional cp,  orthopnea pnd or arm/hand swelling  or leg swelling, presyncope, palpitations, abdominal pain, anorexia, nausea, vomiting, diarrhea  or change in bowel habits or change in bladder habits, change in stools or change in urine, dysuria, hematuria,  rash, arthralgias, visual complaints, headache, numbness, weakness or ataxia or problems with walking or coordination,  change in mood or  memory.        Current Meds  Medication Sig   Accu-Chek Softclix Lancets lancets    acetaminophen  (TYLENOL ) 500 MG tablet Take 500 mg by mouth 3 times/day as needed-between meals & bedtime for moderate pain. Pt takes 1000 mg 2 tablet bid   albuterol  (PROVENTIL ) (2.5 MG/3ML) 0.083% nebulizer solution Take 3  mLs (2.5 mg total) by nebulization every 6 (six) hours as needed for wheezing or shortness of breath.   albuterol  (VENTOLIN  HFA) 108 (90 Base) MCG/ACT inhaler Inhale 2 puffs into the lungs every 6 (six) hours as needed for wheezing or shortness of breath.   Blood Glucose Monitoring Suppl DEVI 1 each by Does not apply route  in the morning, at noon, and at bedtime. May substitute to any manufacturer covered by patient's insurance.   budesonide  (PULMICORT ) 0.25 MG/2ML nebulizer solution Take 2 mLs (0.25 mg total) by nebulization daily.   budesonide -formoterol  (SYMBICORT ) 80-4.5 MCG/ACT inhaler Take 2 puffs first thing in am and then another 2 puffs about 12 hours later.   budesonide -glycopyrrolate-formoterol  (BREZTRI  AEROSPHERE) 160-9-4.8 MCG/ACT AERO inhaler Inhale 2 puffs into the lungs in the morning and at bedtime.   Calcium  Carbonate (CALCIUM  600 PO) Take by mouth. 2 chewables by mouth daily- Bariatric fusion   Cholecalciferol 1.25 MG (50000 UT) capsule Take by mouth.   clotrimazole  (CLOTRIMAZOLE  3) 2 % vaginal cream Apply intravaginally once daily for 3 days   digoxin  (LANOXIN ) 0.125 MG tablet TAKE 1 TABLET BY MOUTH EVERY DAY   diltiazem  (CARDIZEM  CD) 360 MG 24 hr capsule Take 1 capsule (360 mg total) by mouth daily.   furosemide  (LASIX ) 40 MG tablet Take 1 tablet (40 mg total) by mouth daily.   HYDROcodone -acetaminophen  (NORCO) 10-325 MG tablet Take 1 tablet by mouth every 6 (six) hours as needed.   hydrOXYzine  (VISTARIL ) 25 MG capsule Take 1 capsule (25 mg total) by mouth every 6 (six) hours as needed for itching.   losartan  (COZAAR ) 100 MG tablet TAKE 1 TABLET BY MOUTH EVERY DAY   metoprolol  succinate (TOPROL -XL) 25 MG 24 hr tablet TAKE 1 TABLET BY MOUTH TWICE A DAY   montelukast  (SINGULAIR ) 10 MG tablet TAKE 1 TABLET BY MOUTH EVERY DAY   potassium chloride  SA (KLOR-CON  M) 20 MEQ tablet TAKE 1 TABLET BY MOUTH EVERY DAY   predniSONE  (DELTASONE ) 10 MG tablet Take 1 tablet (10 mg total) by mouth  daily with breakfast. 10 mg take  4 each am x 2 days,   2 each am x 2 days,  1 each am x 2 days and stop   promethazine -dextromethorphan (PROMETHAZINE -DM) 6.25-15 MG/5ML syrup Take 5 mLs by mouth 4 (four) times daily as needed.   RABEprazole  (ACIPHEX ) 20 MG tablet TAKE 1 TABLET BY MOUTH EVERY DAY   rosuvastatin  (CRESTOR ) 5 MG tablet TAKE 1 TABLET (5 MG TOTAL) BY MOUTH DAILY.   Semaglutide ,0.25 or 0.5MG /DOS, (OZEMPIC , 0.25 OR 0.5 MG/DOSE,) 2 MG/3ML SOPN Inject 0.5 mg into the skin once a week.   TIADYLT  ER 240 MG 24 hr capsule Take by mouth daily.   warfarin (COUMADIN ) 5 MG tablet TAKE 1 TABLET DAILY EXCEPT 1 1/2 TABLETS ON TUESDAYS, THURSDAYS AND SATURDAYS OR AS DIRECTED                   Objective:   Physical Exam   02/17/2024     351  08/25/2023       329  04/14/2023         324  (BMI 55)   11/13/2017         324  04/22/2017        324  11/07/2016          329  08/01/2016        335  07/01/2016        336   08/24/13 361 lb (163.749 kg)  08/19/13 359 lb (162.841 kg)  03/08/13 369 lb (167.377 kg)      Vital signs reviewed  02/17/2024  - Note at rest 02 sats 95 % on RA   General appearance:    w/c bound  MO ( by BMI) wf nad    HEENT : Oropharynx  clear  NECK :  without  apparent JVD/ palpable Nodes/TM    LUNGS: no acc muscle use,  Nl contour chest with a few insp / exp rhonchi with some upper airway component    CV:  RRR  no s3 or murmur or increase in P2, and legs in ACEi wrap not examined   ABD:  soft and nontender   MS:   ext   without deformities Or obvious joint restrictions  calf tenderness, cyanosis or clubbing    SKIN: warm and dry without lesions    NEURO:  alert, approp, nl sensorium with  no obvious motor or cerebellar deficits apparent.       Assessment & Plan:   Assessment & Plan Asthma, chronic, moderate persistent, uncomplicated  Onset childhood  - rec trial off acei 08/24/2013 and again 07/01/2016 x 3 weeks only - Spirometry 07/01/2016  FEV1 1.21  (47%)  Ratio 69 mild curvature  - FENO 07/01/2016  =   39  - Allergy  profile 08/01/2016 >  Eos 0.5 /  IgE  12 neg RAST  - 09/28/16 confirmed much better off acei (see email)  - 11/07/2016    try symbicort  80 2bid/ depomedrol 120 - 04/22/2017  After extensive coaching inhaler device  effectiveness =    75% > try symb 160 and avoid pred if possible / add gerd rx = aciphex  as can't tol protonix - 11/13/2017 flared off ppi > resume plus pred x 6 days if not improving on ppi  - 08/25/2023 trial of symbicort  80 2bid for uacs / cough variant asthma  (higher doses may be aggravating cough and contributing to risk of recurrent pna)  - 10/06/2023 changed to bud 0.25 bid with albuterol  2.5 mg bid and prn and leave off inhalers/ max gerd rx plus flutter valve to prevent cyclical coughing   Not following instruction re use of alb/budesonide   as did not realize this is roughly the equivalent of airsurpa and doesn't cause as much coughing as LABA / ICS in HFA form. I suspect she would be a lot more short of breath if able to be more active but limited at this point by her legs  >>> rec reviewed prior AVS instructions to point out issues with misunderstanding recs. >>> added Prednisone  low dose as plan D = 20 mg until better then 10 mg daily x 5 d and off   Upper airway cough syndrome Onset ? 2015 on ACEi d/c 08/2013 and intol of most  PPIs  >>> max mucinex d/m/ flutter valve/Aciphex  if can afford it as the only PPI she can tol  Discussed in detail all the  indications, usual  risks and alternatives  relative to the benefits with patient who agrees to proceed with Rx as outlined for both asthma and UACS       Morbid (severe) obesity due to excess calories (HCC) Body mass index is 60.18 kg/m.  -  trending up  Lab Results  Component Value Date   TSH 1.610 07/30/2023    Contributing to doe and risk of GERD/dvt/ PE  >>>   reviewed the need and the process to achieve and maintain neg calorie balance > defer f/u primary  care including intermittently monitoring thyroid  status   Return in 3 m with all RESPIRATORY  meds/devices  in hand using a trust but verify approach to confirm accurate Medication  Reconciliation The principal here is that until we are certain that the  patients are doing what we've asked, it makes  no sense to ask them to do more.           Each RESPIRATORY  maintenance medication was reviewed in detail including emphasizing most importantly the difference between maintenance and prns and under what circumstances the prns are to be triggered using an action plan format where appropriate.  Total time for H and P, chart review, counseling, reviewing hfa/ neb/ flutter  device(s) and generating customized AVS unique to this office visit / same day charting = 42 min  for multiple chronic  refractory respiratory  symptoms.           AVS  Patient Instructions  For severe cough/ congestion  (action plan)   1)   Mucinex dm up to maximum of  1200 mg every 12 hours and use the flutter valve as much as you can    2)   Try replace symbicort  and start  albuterol  2.5 mg with budesonide  0.25 mg mixed together twice daily   3)   if all else fails:  then add prednisone  10 mg x 2 each am until better then 1 daily x 5 days and stop.    Aciphex  20 mg Take 30-60 min before first meal of the day and pepcid  20 mg after supper until return    Please schedule a follow up visit in 3 months but call sooner if needed           Ozell America, MD 02/20/2024

## 2024-02-17 NOTE — Patient Instructions (Signed)
 For severe cough/ congestion  (action plan)   1)   Mucinex dm up to maximum of  1200 mg every 12 hours and use the flutter valve as much as you can    2)   Try replace symbicort  and start  albuterol  2.5 mg with budesonide  0.25 mg mixed together twice daily   3)   if all else fails:  then add prednisone  10 mg x 2 each am until better then 1 daily x 5 days and stop.    Aciphex  20 mg Take 30-60 min before first meal of the day and pepcid  20 mg after supper until return    Please schedule a follow up visit in 3 months but call sooner if needed

## 2024-02-18 ENCOUNTER — Other Ambulatory Visit: Payer: Self-pay | Admitting: Family Medicine

## 2024-02-18 ENCOUNTER — Telehealth: Payer: Self-pay | Admitting: Cardiology

## 2024-02-18 ENCOUNTER — Ambulatory Visit: Payer: Self-pay

## 2024-02-18 MED ORDER — DIGOXIN 125 MCG PO TABS: 125.0000 ug | ORAL_TABLET | Freq: Every day | ORAL | 0 refills | Status: DC

## 2024-02-18 NOTE — Telephone Encounter (Signed)
 PATIENT IS OUT OF DIGOXIN , REFILL REQUEST SENT  FYI Only or Action Required?: Action required by provider: update on patient condition.  Patient was last seen in primary care on 02/09/2024 by Tobie Suzzane POUR, MD.  Called Nurse Triage reporting Palpitations.  Symptoms began today.  Interventions attempted: Nothing.  Symptoms are: unchanged.  Triage Disposition: See HCP Within 4 Hours (Or PCP Triage)  Patient/caregiver understands and will follow disposition?: No  Copied from CRM #[id number]. Topic: Clinical - Red Word Triage >> Feb 18, 2024 11:53 AM Terri MATSU wrote: Red Word that prompted transfer to Nurse Triage: Patient stated her heart is racing. Reason for Disposition  Taking water pill (i.e., diuretic) or heart medication (e.g., digoxin )  Answer Assessment - Initial Assessment Questions Refill request for digoxin  sent  1. DESCRIPTION: Please describe your heart rate or heartbeat that you are having (e.g., fast/slow, regular/irregular, skipped or extra beats, palpitations)     Heart is racing 2. ONSET: When did it start? (e.g., minutes, hours, days)      This morning 3. DURATION: How long does it last (e.g., seconds, minutes, hours)     Constant, but bounces around 4. PATTERN Does it come and go, or has it been constant since it started?  Does it get worse with exertion?   Are you feeling it now?     constant  6. HEART RATE: Can you tell me your heart rate? How many beats in 15 seconds?  Note: Not all patients can do this.       120 7. RECURRENT SYMPTOM: Have you ever had this before? If Yes, ask: When was the last time? and What happened that time?      Has happened in the past.  Has been caridoverted many times in the past 20-30 times 8. CAUSE: What do you think is causing the palpitations?     Has been off of digoxin  for a couple of days 9. CARDIAC HISTORY: Do you have any history of heart disease? (e.g., heart attack, angina, bypass surgery,  angioplasty, arrhythmia)      History of afib 10. OTHER SYMPTOMS: Do you have any other symptoms? (e.g., dizziness, chest pain, sweating, difficulty breathing)       Denies chest pain and shortness of breath  Protocols used: Heart Rate and Heartbeat Questions-A-AH

## 2024-02-18 NOTE — Telephone Encounter (Signed)
 Pt's medication was sent to pt's pharmacy as requested. Confirmation received.

## 2024-02-18 NOTE — Telephone Encounter (Signed)
*  STAT* If patient is at the pharmacy, call can be transferred to refill team.   1. Which medications need to be refilled? (please list name of each medication and dose if known) digoxin (LANOXIN) 0.125 MG tablet  2. Which pharmacy/location (including street and city if local pharmacy) is medication to be sent to? [id number] [place] [place] [address] 3. Do they need a 30 day or 90 day supply? [address]

## 2024-02-18 NOTE — Telephone Encounter (Signed)
 Per cardiology patient needs appointment for refills

## 2024-02-19 ENCOUNTER — Ambulatory Visit: Payer: Self-pay | Admitting: Internal Medicine

## 2024-02-20 NOTE — Assessment & Plan Note (Signed)
 Onset ? 2015 on ACEi d/c 08/2013 and intol of most  PPIs  >>> max mucinex d/m/ flutter valve/Aciphex  if can afford it as the only PPI she can tol  Discussed in detail all the  indications, usual  risks and alternatives  relative to the benefits with patient who agrees to proceed with Rx as outlined for both asthma and UACS

## 2024-02-20 NOTE — Assessment & Plan Note (Signed)
 Onset childhood  - rec trial off acei 08/24/2013 and again 07/01/2016 x 3 weeks only - Spirometry 07/01/2016  FEV1 1.21 (47%)  Ratio 69 mild curvature  - FENO 07/01/2016  =   39  - Allergy  profile 08/01/2016 >  Eos 0.5 /  IgE  12 neg RAST  - 09/28/16 confirmed much better off acei (see email)  - 11/07/2016    try symbicort  80 2bid/ depomedrol 120 - 04/22/2017  After extensive coaching inhaler device  effectiveness =    75% > try symb 160 and avoid pred if possible / add gerd rx = aciphex  as can't tol protonix - 11/13/2017 flared off ppi > resume plus pred x 6 days if not improving on ppi  - 08/25/2023 trial of symbicort  80 2bid for uacs / cough variant asthma  (higher doses may be aggravating cough and contributing to risk of recurrent pna)  - 10/06/2023 changed to bud 0.25 bid with albuterol  2.5 mg bid and prn and leave off inhalers/ max gerd rx plus flutter valve to prevent cyclical coughing   Not following instruction re use of alb/budesonide   as did not realize this is roughly the equivalent of airsurpa and doesn't cause as much coughing as LABA / ICS in HFA form. I suspect she would be a lot more short of breath if able to be more active but limited at this point by her legs  >>> rec reviewed prior AVS instructions to point out issues with misunderstanding recs. >>> added Prednisone  low dose as plan D = 20 mg until better then 10 mg daily x 5 d and off

## 2024-02-20 NOTE — Assessment & Plan Note (Addendum)
 Body mass index is 60.18 kg/m.  -  trending up  Lab Results  Component Value Date   TSH 1.610 07/30/2023    Contributing to doe and risk of GERD/dvt/ PE  >>>   reviewed the need and the process to achieve and maintain neg calorie balance > defer f/u primary care including intermittently monitoring thyroid  status   Return in 3 m with all RESPIRATORY  meds/devices  in hand using a trust but verify approach to confirm accurate Medication  Reconciliation The principal here is that until we are certain that the  patients are doing what we've asked, it makes no sense to ask them to do more.           Each RESPIRATORY  maintenance medication was reviewed in detail including emphasizing most importantly the difference between maintenance and prns and under what circumstances the prns are to be triggered using an action plan format where appropriate.  Total time for H and P, chart review, counseling, reviewing hfa/ neb/ flutter  device(s) and generating customized AVS unique to this office visit / same day charting = 42 min  for multiple chronic  refractory respiratory  symptoms.

## 2024-02-21 ENCOUNTER — Emergency Department (HOSPITAL_COMMUNITY)
Admission: EM | Admit: 2024-02-21 | Discharge: 2024-02-21 | Disposition: A | Discharge status: Home / Self Care | Attending: Emergency Medicine | Admitting: Emergency Medicine

## 2024-02-21 ENCOUNTER — Other Ambulatory Visit: Payer: Self-pay

## 2024-02-21 DIAGNOSIS — M542 Cervicalgia: Secondary | ICD-10-CM | POA: Diagnosis not present

## 2024-02-21 DIAGNOSIS — Z5321 Procedure and treatment not carried out due to patient leaving prior to being seen by health care provider: Secondary | ICD-10-CM | POA: Diagnosis not present

## 2024-02-21 DIAGNOSIS — R519 Headache, unspecified: Secondary | ICD-10-CM | POA: Diagnosis present

## 2024-02-21 DIAGNOSIS — R6883 Chills (without fever): Secondary | ICD-10-CM | POA: Diagnosis not present

## 2024-02-21 NOTE — ED Triage Notes (Signed)
 Pt c/o headache, neck pain, chills and in a fog x 3 weeks. Pt states she thinks the hydrocodone  she has been taking for 2 months is the reason. Pt took a half of pill at 5pm.

## 2024-02-22 ENCOUNTER — Ambulatory Visit: Attending: Cardiology | Admitting: *Deleted

## 2024-02-22 DIAGNOSIS — Z5181 Encounter for therapeutic drug level monitoring: Secondary | ICD-10-CM | POA: Diagnosis not present

## 2024-02-22 DIAGNOSIS — I480 Paroxysmal atrial fibrillation: Secondary | ICD-10-CM | POA: Diagnosis not present

## 2024-02-22 LAB — POCT INR: INR: 2 (ref 2.0–3.0)

## 2024-02-22 NOTE — Patient Instructions (Signed)
 Continue warfarin 1 1/2 tablets daily except 1 tablet on Sundays Recheck INR in 4 weeks

## 2024-02-23 ENCOUNTER — Encounter: Payer: Self-pay | Admitting: Cardiology

## 2024-02-23 ENCOUNTER — Ambulatory Visit: Payer: Self-pay

## 2024-02-23 NOTE — Progress Notes (Signed)
 INR 2.0 Please see anticoagulation encounter.

## 2024-02-23 NOTE — Telephone Encounter (Signed)
 Number not in service.

## 2024-02-23 NOTE — Telephone Encounter (Signed)
 FYI Only or Action Required?: Action required by provider: clinical question for provider and update on patient condition.  Patient was last seen in primary care on 02/09/2024 by Tobie Suzzane POUR, MD.  Called Nurse Triage reporting Advice Only.  Symptoms began today.  Interventions attempted: Other: RN had patient check BP and pulse: 145/68 and pulse 84.  Symptoms are: asymptomatic.  Triage Disposition: Call PCP Now  Patient/caregiver understands and will follow disposition?: Yes        Copied from CRM #[id number]. Topic: Clinical - Red Word Triage >> Feb 23, 2024  9:20 AM Leonette P wrote: Red Word that prompted transfer to Nurse Triage: patient thinks she overtook one of her medications Reason for Disposition  [1] DOUBLE DOSE (an extra dose or lesser amount) of prescription drug AND [2] NO symptoms  (Exception: A double dose of antibiotics.)  Answer Assessment - Initial Assessment Questions 1. NAME of MEDICINE: What medicine(s) are you calling about?     Diltiazem , metoprolol , losartan .  2. QUESTION: What is your question? (e.g., double dose of medicine, side effect)     Double dose of medication. Patient states she got home yesterday around 6-7pm and realized she had not taken her daily medications so she took them at that time. She states she forgot she took her medications late yesterday and took her medications this morning around 09:15. She is concerned about her doses being too close together. Patient held her digoxin  dosage this morning due to taking it late yesterday PM.  3. PRESCRIBER: Who prescribed the medicine? Reason: if prescribed by specialist, call should be referred to that group.     Dr Jeffrie prescribes diltiazem  and PCP Del Wilhelmena Falter prescribes metoprolol  and losartan . RN advised patient to call Dr Jeffrie regarding digoxin  and diltiazem .  4. SYMPTOMS: Do you have any symptoms? If Yes, ask: What symptoms are you having?  How bad are the symptoms  (e.g., mild, moderate, severe)     No symptoms.  Protocols used: Medication Question Call-A-AH

## 2024-02-24 ENCOUNTER — Telehealth: Payer: Self-pay | Admitting: Family Medicine

## 2024-02-24 ENCOUNTER — Ambulatory Visit: Payer: Self-pay

## 2024-02-24 NOTE — Telephone Encounter (Signed)
 Called patient to discuss MyChart message. Left message to callback or reply via MyChart.

## 2024-02-24 NOTE — Telephone Encounter (Unsigned)
 Copied from CRM [id number]. Topic: Clinical - Medication Refill >> Feb 24, 2024 12:17 PM Deaijah H wrote: Medication:  HYDROcodone -acetaminophen  (NORCO) 10-325 MG tablet    Has the patient contacted their pharmacy? No (Agent: If no, request that the patient contact the pharmacy for the refill. If patient does not wish to contact the pharmacy document the reason why and proceed with request.) Due to being controlled substance  (Agent: If yes, when and what did the pharmacy advise?)  This is the patient's preferred pharmacy:  [id number] [place], [place] - [address] [place] [address] Phone: [phone] Fax: [phone]   Is this the correct pharmacy for this prescription? Yes If no, delete pharmacy and type the correct one.   Has the prescription been filled recently? Yes  Is the patient out of the medication? Yes  Has the patient been seen for an appointment in the last year OR does the patient have an upcoming appointment? Yes  Can we respond through MyChart? Yes  Agent: Please be advised that Rx refills may take up to 3 business days. We ask that you follow-up with your pharmacy.

## 2024-02-24 NOTE — Telephone Encounter (Signed)
 FYI Only or Action Required?: Action required by provider: medication refill request.  Patient was last seen in primary care on 02/09/2024 by Alexis Suzzane POUR, MD.  Called Nurse Triage reporting Leg Pain.  Symptoms began chronic.  Interventions attempted: Prescription medications: norco.  Symptoms are: gradually improving.  Triage Disposition: See Physician Within 24 Hours  Patient/caregiver understands and will follow disposition?: Yes   Copied from CRM [id number]. Topic: Clinical - Red Word Triage >> Feb 24, 2024 12:19 PM Alexis [name] wrote: Red Word that prompted transfer to Nurse Triage: Lot of pain legs ; would like to let Alexis [name] to put her back on lower dose 5-325 Reason for Disposition  Looks like a boil, infected sore, deep ulcer or other infected rash (spreading redness, pus)  Answer Assessment - Initial Assessment Questions Additional info: 1) Requesting Norco refill but the lower dose of 5-325, she took her last 10/325 today and would like fill of Norco 5-325 today. Using for open wounds on leg 2) She is also asking for refill of Keflex , she states she took 10 day course with improvement to leg wounds but not resolved, she feels if she has a second course her wounds will heal.  3) Wound care nurse home visit is scheduled today.  4) Has pcp appointment 02/25/24    1. ONSET: When did the pain start?      Chronic 2. LOCATION: Where is the pain located?      Bilateral legs  3. PAIN: How bad is the pain?    (Scale 1-10; or mild, moderate, severe)     Moderate  4. WORK OR EXERCISE: Has there been any recent work or exercise that involved this part of the body?       5. CAUSE: What do you think is causing the leg pain?     Joints  6. OTHER SYMPTOMS: Do you have any other symptoms? (e.g., chest pain, back pain, breathing difficulty, swelling, rash, fever, numbness, weakness)     Brain fog at times that she feels is related to Norco 10/325 did not experience this on  Norco 5/325 7. PREGNANCY: Is there any chance you are pregnant? When was your last menstrual period?  Protocols used: Leg Pain-A-AH

## 2024-02-24 NOTE — Telephone Encounter (Signed)
 Has appt tmr, will discuss then

## 2024-02-25 ENCOUNTER — Ambulatory Visit: Attending: Cardiology | Admitting: Cardiology

## 2024-02-25 ENCOUNTER — Ambulatory Visit

## 2024-02-25 DIAGNOSIS — L03116 Cellulitis of left lower limb: Secondary | ICD-10-CM

## 2024-02-25 DIAGNOSIS — L03115 Cellulitis of right lower limb: Secondary | ICD-10-CM | POA: Diagnosis not present

## 2024-02-25 DIAGNOSIS — E1169 Type 2 diabetes mellitus with other specified complication: Secondary | ICD-10-CM | POA: Diagnosis not present

## 2024-02-25 DIAGNOSIS — G894 Chronic pain syndrome: Secondary | ICD-10-CM | POA: Diagnosis not present

## 2024-02-25 MED ORDER — HYDROCODONE-ACETAMINOPHEN 5-325 MG PO TABS: 1.0000 | ORAL_TABLET | ORAL | 0 refills | Status: DC | PRN

## 2024-02-25 MED ORDER — FLUCONAZOLE 150 MG PO TABS: 150.0000 mg | ORAL_TABLET | Freq: Once | ORAL | 0 refills | Status: AC

## 2024-02-25 MED ORDER — CEPHALEXIN 500 MG PO CAPS: 500.0000 mg | ORAL_CAPSULE | Freq: Three times a day (TID) | ORAL | 0 refills | Status: AC

## 2024-02-25 NOTE — Progress Notes (Signed)
 Established Patient Office Visit  Subjective   Patient ID: Alexis [name], female    DOB: 1955/11/28  Age: 68 y.o. MRN: [id number]  No chief complaint on file.   HPI Discussed the use of AI scribe software for clinical note transcription with the patient, who gave verbal consent to proceed.  History of Present Illness    Alexis [name] is a 68 year old female who presents for a hospital follow-up and medication management.  Pain management and analgesic side effects - Hydrocodone  used for pain control; previously increased dosage resulted in neck pain and stiffness. - Prefers to return to lower dose of 5/325 mg; currently breaking higher dose pills in half to manage symptoms. - Ibuprofen 800 mg trial caused somnolence, possibly due to concurrent hydrocodone  use and poor sleep. - Concerned about potential interaction between ibuprofen and warfarin.  Lower extremity wounds and wound care - Managing leg wounds; one leg shows improvement, other leg has persistent open wound. - Receiving wound care from home health nurse, including leg wrapping. - Initiating physical therapy for wound management. - Requests cephalexin  refill for ongoing wound treatment.  Anticoagulation therapy and drug interactions - Currently taking warfarin; Coumadin  levels checked monthly, most recent check was yesterday. - Concerned about drug interactions between warfarin and medications for yeast infection, specifically regarding effects on vitamin K and warfarin efficacy.  Toenail trauma and podiatric care - History of toe injury from stubbing, with possible nail loss. - Considering podiatry referral for nail care.     Patient Active Problem List   Diagnosis Date Noted   Subacute cough 09/24/2023   Acute asthmatic bronchitis 08/21/2023   Edema 08/21/2023   Stasis dermatitis of both legs 08/21/2023   Acute cough 07/30/2023   Acute bacterial bronchitis 07/03/2023   Abdominal pain 01/12/2023   Contact  dermatitis 01/12/2023   Diabetic ulcer of left lower leg (HCC) 12/29/2022   Type 2 diabetes mellitus with other specified complication (HCC) 09/11/2022   Pap smear for cervical cancer screening 05/21/2022   Cellulitis of both lower extremities 04/27/2022   Upper airway cough syndrome 02/16/2019   History of cardioversion 01/27/2019   Osteoarthritis 07/06/2017   BMI 50.0-59.9, adult (HCC) 09/12/2016   Gastroesophageal reflux disease without esophagitis 09/04/2016   Long term (current) use of anticoagulants 07/09/2016   Primary osteoarthritis of both knees 07/02/2016   Controlled substance agreement signed 06/06/2016   Knee pain, bilateral 02/25/2016   Major depressive disorder, recurrent, in full remission 02/25/2016   CKD (chronic kidney disease) stage 3, GFR 30-59 ml/min (HCC) 02/11/2016   Leg swelling 02/11/2016   History of adenomatous polyp of colon 01/26/2016   Chronic pain 10/18/2015   Asthma, intermittent, uncomplicated 12/19/2014   Hypersomnolence 08/24/2013   History of endometrial cancer 04/07/2013   Asthma, mild persistent with major component of UACS worse on ACEi  01/21/2013   Pre-operative cardiovascular examination 01/21/2013   Paroxysmal A-fib (HCC)    Morbid (severe) obesity due to excess calories (HCC)    Essential hypertension     ROS    Objective:     There were no vitals taken for this visit. BP Readings from Last 3 Encounters:  02/21/24 (!) 134/99  02/17/24 128/83  02/09/24 122/78   Wt Readings from Last 3 Encounters:  02/21/24 (!) 350 lb (158.8 kg)  02/17/24 (!) 350 lb 9.6 oz (159 kg)  02/09/24 243 lb (110.2 kg)     Physical Exam Vitals and nursing note reviewed. Exam conducted with a chaperone  present (husband is with her).  Constitutional:      General: She is not in acute distress.    Appearance: Normal appearance. She is obese.  HENT:     Head: Normocephalic.  Eyes:     Extraocular Movements: Extraocular movements intact.     Pupils:  Pupils are equal, round, and reactive to light.  Cardiovascular:     Rate and Rhythm: Normal rate and regular rhythm.     Pulses: Normal pulses.     Heart sounds: Normal heart sounds.  Pulmonary:     Effort: Pulmonary effort is normal.     Breath sounds: Normal breath sounds.  Musculoskeletal:     Right lower leg: Tenderness present. 2+ Pitting Edema (wrapped with pressure dressings) present.     Left lower leg: Tenderness present. 2+ Pitting Edema (wrapped with pressure dressings) present.     Comments: Large fluid-filled blisters present on anterior shins.  Mild weeping on posterior aspect of calves.  Skin:    Capillary Refill: Capillary refill takes less than 2 seconds.  Neurological:     Mental Status: She is alert and oriented to person, place, and time.     Gait: Gait abnormal (using WC).  Psychiatric:        Mood and Affect: Mood normal.        Behavior: Behavior normal.        Thought Content: Thought content normal.    No results found for any visits on 02/25/24.  Last CBC Lab Results  Component Value Date   WBC 12.3 (H) 02/09/2024   HGB 13.9 02/09/2024   HCT 44.8 02/09/2024   MCV 91 02/09/2024   MCH 28.3 02/09/2024   RDW 13.4 02/09/2024   PLT 297 02/09/2024   Last metabolic panel Lab Results  Component Value Date   GLUCOSE 276 (H) 02/09/2024   NA 141 02/09/2024   K 4.3 02/09/2024   CL 101 02/09/2024   CO2 23 02/09/2024   BUN 19 02/09/2024   CREATININE 0.87 02/09/2024   EGFR 73 02/09/2024   CALCIUM  9.2 02/09/2024   PROT 6.8 02/09/2024   ALBUMIN 4.0 02/09/2024   LABGLOB 2.8 02/09/2024   AGRATIO 2.1 05/05/2022   BILITOT 0.4 02/09/2024   ALKPHOS 135 02/09/2024   AST 18 02/09/2024   ALT 16 02/09/2024   ANIONGAP 7 01/19/2017   Last lipids Lab Results  Component Value Date   CHOL 158 07/30/2023   HDL 42 07/30/2023   LDLCALC 96 07/30/2023   TRIG 108 07/30/2023   CHOLHDL 3.8 07/30/2023   Last hemoglobin A1c Lab Results  Component Value Date    HGBA1C 7.4 (H) 02/09/2024   Last thyroid  functions Lab Results  Component Value Date   TSH 1.610 07/30/2023   FREET4 1.20 07/30/2023   Last vitamin D  Lab Results  Component Value Date   VD25OH 13.1 (L) 07/30/2023   Last vitamin B12 and Folate Lab Results  Component Value Date   VITAMINB12 243 10/06/2022      The 10-year ASCVD risk score (Arnett DK, et al., 2019) is: 18.3%    Assessment & Plan:   Problem List Items Addressed This Visit       Endocrine   Type 2 diabetes mellitus with other specified complication (HCC) - Primary   Lab Results  Component Value Date   HGBA1C 7.4 (H) 02/09/2024   Associated with HTN, HLD Has been overall well-controlled, but had to take oral prednisone  recently Started Ozempic  0.5 mg qw, considering his  cardiac benefits -plan to increase dose as tolerated Advised to follow diabetic diet - small, frequent meals while on Ozempic  On statin and ARB F/u CMP and lipid panel Diabetic eye exam: Advised to follow up with Ophthalmology for diabetic eye exam Podiatry referral placed for foot care.        Relevant Orders   Ambulatory referral to Podiatry     Other   Chronic pain   Managed with hydrocodone . She prefers 5/325 mg due to side effects with higher dosage. Ibuprofen 800 mg caused sedation, likely due to interaction with hydrocodone  and poor sleep. - Prescribed hydrocodone  5/325 mg.      Relevant Medications   HYDROcodone -acetaminophen  (NORCO/VICODIN) 5-325 MG tablet   Cellulitis of both lower extremities   Chronic wound with ongoing cellulitis. Home health nurse involved in wound care. Cephalexin  prescribed for infection control. Discussed potential interaction with warfarin. - Prescribed cephalexin  for 10 days. - Continue home health nurse visits for wound care. - Initiate physical therapy for the left leg.      Relevant Medications   cephALEXin  (KEFLEX ) 500 MG capsule    Return in about 3 months (around 05/27/2024).     Leita Longs, FNP

## 2024-02-26 ENCOUNTER — Telehealth: Payer: Self-pay | Admitting: Adult Health

## 2024-02-26 NOTE — Telephone Encounter (Signed)
 Follow up chest xray  shows clearance. Okay to cancel CT chest .

## 2024-02-26 NOTE — Telephone Encounter (Signed)
 I spoke with [facility] today. She said this order was from a while back when she had pneumonia and she's ok now. She also stated that she had a recent xray and everything was fine with that. I wanted to check with you if it would be ok to cancel this order. Please advise. Thank you.

## 2024-02-28 NOTE — Assessment & Plan Note (Signed)
 Chronic wound with ongoing cellulitis. Home health nurse involved in wound care. Cephalexin  prescribed for infection control. Discussed potential interaction with warfarin. - Prescribed cephalexin  for 10 days. - Continue home health nurse visits for wound care. - Initiate physical therapy for the left leg.

## 2024-02-28 NOTE — Assessment & Plan Note (Signed)
 Managed with hydrocodone . She prefers 5/325 mg due to side effects with higher dosage. Ibuprofen 800 mg caused sedation, likely due to interaction with hydrocodone  and poor sleep. - Prescribed hydrocodone  5/325 mg.

## 2024-02-28 NOTE — Assessment & Plan Note (Signed)
 Lab Results  Component Value Date   HGBA1C 7.4 (H) 02/09/2024   Associated with HTN, HLD Has been overall well-controlled, but had to take oral prednisone  recently Started Ozempic  0.5 mg qw, considering his cardiac benefits -plan to increase dose as tolerated Advised to follow diabetic diet - small, frequent meals while on Ozempic  On statin and ARB F/u CMP and lipid panel Diabetic eye exam: Advised to follow up with Ophthalmology for diabetic eye exam Podiatry referral placed for foot care.

## 2024-03-10 ENCOUNTER — Ambulatory Visit: Admitting: Internal Medicine

## 2024-03-15 ENCOUNTER — Other Ambulatory Visit: Payer: Self-pay

## 2024-03-15 ENCOUNTER — Ambulatory Visit: Payer: Self-pay

## 2024-03-15 DIAGNOSIS — G894 Chronic pain syndrome: Secondary | ICD-10-CM

## 2024-03-15 MED ORDER — HYDROCODONE-ACETAMINOPHEN 7.5-325 MG PO TABS: 1.0000 | ORAL_TABLET | ORAL | 0 refills | Status: DC | PRN

## 2024-03-15 NOTE — Telephone Encounter (Signed)
 Hydrocodone  7.5 mg sent in to pharmacy

## 2024-03-15 NOTE — Telephone Encounter (Signed)
 FYI Only or Action Required?: Action required by provider: clinical question for provider.: pt is requesting medication change  Patient was last seen in primary care on 02/25/2024 by Bevely Doffing, FNP.  Called Nurse Triage reporting Advice Only.  Symptoms began today.  Triage Disposition: Information or Advice Only Call  Patient/caregiver understands and will follow disposition?: No, wishes to speak with PCP   Copied from CRM #[id number]. Topic: Clinical - Red Word Triage >> Mar 15, 2024  2:40 PM Shanda MATSU wrote: Red Word that prompted transfer to Nurse Triage: Patient is reporting increased pain on both knees and legs, stated med, HYDROcodone -acetaminophen  (NORCO/VICODIN) 5-325 MG tablet, is not providing relief, is asking if maybe dose can be increased. Reason for Disposition  Health information question, no triage required and triager able to answer question  Answer Assessment - Initial Assessment Questions 1. REASON FOR CALL: What is the main reason for your call? or How can I best help you?    Pt calling today with increasing bilateral knee and leg pain.  Stated last office visit pt asked PCP to decrease hydrocodone  from 10-325 mg and was given 5-325 mg/.  Pt stated 5-325 mg is not controlling pain and now is requesting PCP up dose to 7-325 mg.  Pt also stated due to extreme pain pt took 2 tabs of 5-325 mg at once only one time and stated she did not feel any of the side effects pt was experiencing before.  Pt again is requesting an increase in dose of pain medication  to either 7.5-325 mg of hydrocodone  or either what ever recommended by PCP: pt aslo asks if medication can be called into to pt 's new pharmacy located at [facility] at [address], [place], [place]  [address].  Protocols used: Information Only Call - No Triage-A-AH

## 2024-03-16 ENCOUNTER — Telehealth: Payer: Self-pay

## 2024-03-16 ENCOUNTER — Other Ambulatory Visit: Payer: Self-pay | Admitting: Cardiology

## 2024-03-16 NOTE — Telephone Encounter (Signed)
 Verbal orders provided.

## 2024-03-16 NOTE — Telephone Encounter (Signed)
Call cannot be completed  

## 2024-03-16 NOTE — Telephone Encounter (Signed)
 Copied from CRM #[id number]. Topic: Clinical - Home Health Verbal Orders >> Mar 16, 2024  4:25 PM Wess RAMAN wrote: Caller/Agency: [facility] Callback Number: [phone] Service Requested: [name] would like approval to add Medihoney to Wound Care Frequency: twice per week Any new concerns about the patient? No

## 2024-03-21 ENCOUNTER — Ambulatory Visit

## 2024-03-21 ENCOUNTER — Encounter: Payer: Self-pay | Admitting: Cardiology

## 2024-03-21 ENCOUNTER — Other Ambulatory Visit: Payer: Self-pay | Admitting: Cardiology

## 2024-03-24 ENCOUNTER — Ambulatory Visit

## 2024-03-24 ENCOUNTER — Telehealth: Payer: Self-pay | Admitting: Cardiology

## 2024-03-24 NOTE — Telephone Encounter (Signed)
°*  STAT* If patient is at the pharmacy, call can be transferred to refill team.   1. Which medications need to be refilled? (please list name of each medication and dose if known)   digoxin  (LANOXIN ) 0.125 MG tablet   2. Would you like to learn more about the convenience, safety, & potential cost savings by using the [facility]?   3. Are you open to using the [facility] (Type [facility]. ).  4. Which pharmacy/location (including street and city if local pharmacy) is medication to be sent to?  [id number] - [place], [place] - [address]   5. Do they need a 30 day or 90 day supply?   Patient stated she has been completely out of this medication for a week.  Patient has appointment scheduled with Dr. Jeffrie on 1/13.

## 2024-03-25 NOTE — Telephone Encounter (Signed)
 Pt scheudled 04/19/24, refill sent.

## 2024-03-28 ENCOUNTER — Other Ambulatory Visit: Payer: Self-pay | Admitting: Cardiology

## 2024-03-28 ENCOUNTER — Ambulatory Visit: Attending: Cardiology

## 2024-03-29 ENCOUNTER — Ambulatory Visit

## 2024-04-06 ENCOUNTER — Inpatient Hospital Stay (HOSPITAL_COMMUNITY)
Admission: RE | Admit: 2024-04-06 | Payer: Commercial Managed Care - PPO | Source: Ambulatory Visit | Admitting: Internal Medicine

## 2024-04-06 DIAGNOSIS — I4891 Unspecified atrial fibrillation: Secondary | ICD-10-CM | POA: Insufficient documentation

## 2024-04-15 ENCOUNTER — Other Ambulatory Visit: Payer: Self-pay | Admitting: Family Medicine

## 2024-04-19 ENCOUNTER — Ambulatory Visit: Admitting: Cardiology

## 2024-04-19 ENCOUNTER — Other Ambulatory Visit: Payer: Self-pay | Admitting: Internal Medicine

## 2024-04-20 ENCOUNTER — Ambulatory Visit

## 2024-04-22 ENCOUNTER — Other Ambulatory Visit: Payer: Self-pay

## 2024-04-22 DIAGNOSIS — G894 Chronic pain syndrome: Secondary | ICD-10-CM

## 2024-04-22 DIAGNOSIS — I480 Paroxysmal atrial fibrillation: Secondary | ICD-10-CM

## 2024-04-22 MED ORDER — DIGOXIN 125 MCG PO TABS: 125.0000 ug | ORAL_TABLET | Freq: Every day | ORAL | 0 refills | Status: AC

## 2024-04-22 MED ORDER — HYDROCODONE-ACETAMINOPHEN 7.5-325 MG PO TABS: 1.0000 | ORAL_TABLET | ORAL | 0 refills | Status: DC | PRN

## 2024-04-24 ENCOUNTER — Other Ambulatory Visit: Payer: Self-pay | Admitting: Family Medicine

## 2024-04-25 ENCOUNTER — Ambulatory Visit: Attending: Cardiology | Admitting: *Deleted

## 2024-04-25 DIAGNOSIS — Z5181 Encounter for therapeutic drug level monitoring: Secondary | ICD-10-CM

## 2024-04-25 DIAGNOSIS — I480 Paroxysmal atrial fibrillation: Secondary | ICD-10-CM

## 2024-04-25 LAB — POCT INR: INR: 1.4 — AB (ref 2.0–3.0)

## 2024-04-25 NOTE — Progress Notes (Signed)
 INR 1.4

## 2024-04-25 NOTE — Patient Instructions (Signed)
 Take warfarin 2 tablets tonight and tomorrow night then resume 1 1/2 tablets daily except 1 tablet on Sundays Recheck INR in 3 weeks

## 2024-04-28 ENCOUNTER — Telehealth: Payer: Self-pay

## 2024-04-28 NOTE — Telephone Encounter (Signed)
 Patient is calling to make sure that the fax from [facility] was received and that the doctor was able to fill the prescription so that she could get her supplies needed due to her not having anymore at the moment.

## 2024-04-28 NOTE — Telephone Encounter (Signed)
 Copied from CRM #[id number]. Topic: Clinical - Medication Question >> Apr 28, 2024 10:25 AM Gustabo D wrote: Nena with [facility]- Will be faxing over prescription request for wound care supplies  Phone number- [phone]  Fax [phone]

## 2024-05-03 ENCOUNTER — Telehealth: Payer: Self-pay

## 2024-05-03 NOTE — Telephone Encounter (Signed)
 Copied from CRM #[id number]. Topic: Clinical - Order For Equipment >> May 02, 2024  2:13 PM Victoria B wrote: Reason for CRM: Alexis [name] called in, states faxed request for wound care supplies, waiting on status. Fx is  [phone]

## 2024-05-05 ENCOUNTER — Ambulatory Visit: Admitting: Internal Medicine

## 2024-05-06 ENCOUNTER — Ambulatory Visit

## 2024-05-06 ENCOUNTER — Other Ambulatory Visit (HOSPITAL_BASED_OUTPATIENT_CLINIC_OR_DEPARTMENT_OTHER): Payer: Self-pay

## 2024-05-06 VITALS — Ht 65.0 in | Wt 330.0 lb

## 2024-05-06 DIAGNOSIS — Z780 Asymptomatic menopausal state: Secondary | ICD-10-CM

## 2024-05-06 DIAGNOSIS — Z1231 Encounter for screening mammogram for malignant neoplasm of breast: Secondary | ICD-10-CM

## 2024-05-06 DIAGNOSIS — E1169 Type 2 diabetes mellitus with other specified complication: Secondary | ICD-10-CM

## 2024-05-06 DIAGNOSIS — Z1211 Encounter for screening for malignant neoplasm of colon: Secondary | ICD-10-CM

## 2024-05-06 DIAGNOSIS — Z7189 Other specified counseling: Secondary | ICD-10-CM

## 2024-05-06 DIAGNOSIS — Z0000 Encounter for general adult medical examination without abnormal findings: Secondary | ICD-10-CM

## 2024-05-06 MED ORDER — BLOOD GLUCOSE MONITOR SYSTEM W/DEVICE KIT
1.0000 | PACK | Freq: Three times a day (TID) | 0 refills | Status: AC
  Filled 2024-05-06: qty 1, 30d supply, fill #0

## 2024-05-06 MED ORDER — LANCET DEVICE MISC
1.0000 | Freq: Three times a day (TID) | 0 refills | Status: AC
  Filled 2024-05-06: qty 1, 30d supply, fill #0

## 2024-05-06 MED ORDER — ACCU-CHEK SOFTCLIX LANCETS MISC
1.0000 | 0 refills | Status: AC
  Filled 2024-05-06: qty 100, 25d supply, fill #0

## 2024-05-06 MED ORDER — BLOOD GLUCOSE TEST VI STRP
1.0000 | ORAL_STRIP | Freq: Three times a day (TID) | 0 refills | Status: AC
  Filled 2024-05-06: qty 100, 34d supply, fill #0

## 2024-05-06 NOTE — Patient Instructions (Addendum)
 Ms. Raulerson,  Thank you for taking the time for your Medicare Wellness Visit. I appreciate your continued commitment to your health goals. Please review the care plan we discussed, and feel free to reach out if I can assist you further.  Please note that Annual Wellness Visits do not include a physical exam. Some assessments may be limited, especially if the visit was conducted virtually. If needed, we may recommend an in-person follow-up with your provider.  Ongoing Care Seeing your primary care provider every 3 to 6 months helps us  monitor your health and provide consistent, personalized care.   Mammogram/Bone Density Screening: Call [facility] @ Phone: [phone]   Referrals If a referral was made during today's visit and you haven't received any updates within two weeks, please contact the referred provider directly to check on the status.  [facility] at  [address] Phone: [phone]   A referral has been placed for you to check and see what additional resources are available to you.   If you haven't heard from anyone within the next 7 business days, please call them and let them know a referral has been placed for you Phone: [phone]  Recommended Screenings:  Health Maintenance  Topic Date Due   DTaP/Tdap/Td vaccine (1 - Tdap) Never done   Zoster (Shingles) Vaccine (1 of 2) Never done   Osteoporosis screening with Bone Density Scan  Never done   Breast Cancer Screening  03/07/2022   Kidney health urinalysis for diabetes  05/06/2023   COVID-19 Vaccine (5 - 2025-26 season) 12/07/2023   Medicare Annual Wellness Visit  02/19/2024   Eye exam for diabetics  04/08/2024   Colon Cancer Screening  01/22/2026*   Complete foot exam   07/29/2024   Hemoglobin A1C  08/08/2024   Yearly kidney function blood test for diabetes  02/08/2025   Pneumococcal Vaccine for age over 24 (3 of 3 - PCV20 or PCV21)  11/14/2025   Flu Shot  Completed   Hepatitis C Screening  Completed   Meningitis B Vaccine  Aged Out  *Topic was postponed. The date shown is not the original due date.       05/06/2024    3:17 PM  Advanced Directives  Does Patient Have a Medical Advance Directive? No  Would patient like information on creating a medical advance directive? No - Guardian declined    Vision: Annual vision screenings are recommended for early detection of glaucoma, cataracts, and diabetic retinopathy. These exams can also reveal signs of chronic conditions such as diabetes and high blood pressure.  Dental: Annual dental screenings help detect early signs of oral cancer, gum disease, and other conditions linked to overall health, including heart disease and diabetes.  Please see the attached documents for additional preventive care recommendations.

## 2024-05-06 NOTE — Telephone Encounter (Signed)
 Called [facility] to let them know we have received there fax and we are just awaiting provider Signature, Currently Provider is out die to an Emergency and we will get back to them as soon as possible

## 2024-05-11 ENCOUNTER — Telehealth: Payer: Self-pay

## 2024-05-11 DIAGNOSIS — G894 Chronic pain syndrome: Secondary | ICD-10-CM

## 2024-05-11 MED ORDER — HYDROCODONE-ACETAMINOPHEN 5-325 MG PO TABS: ORAL_TABLET | ORAL | 0 refills | Status: DC

## 2024-05-11 NOTE — Progress Notes (Unsigned)
 Complex Care Management Note Care Guide Note  05/11/2024 Name: Alexis [name] MRN: [id number] DOB: [birth year]   Complex Care Management Outreach Attempts: An unsuccessful telephone outreach was attempted today to offer the patient information about available complex care management services.  Follow Up Plan:  Additional outreach attempts will be made to offer the patient complex care management information and services.   Encounter Outcome:  No Answer  Jeoffrey Buffalo , RMA     [place]  [facility] Guide  Direct Dial: [phone]  Website: [email or web address]

## 2024-05-11 NOTE — Telephone Encounter (Signed)
 Record reviewed needs in office visit re pain management. I have prescribed 5 day course only of vicodin 5 based on recent office note

## 2024-05-11 NOTE — Addendum Note (Signed)
 Addended by: ANTONETTA ROLLENE BRAVO on: 05/11/2024 09:16 PM   Modules accepted: Orders

## 2024-05-11 NOTE — Telephone Encounter (Signed)
 Copied from CRM [id number]. Topic: Clinical - Medication Refill >> May 11, 2024  2:05 PM Harlene ORN wrote: Medication: HYDROcodone -acetaminophen  (NORCO) 7.5-325 MG tablet  Has the patient contacted their pharmacy? No (Agent: If no, request that the patient contact the pharmacy for the refill. If patient does not wish to contact the pharmacy document the reason why and proceed with request.) (Agent: If yes, when and what did the pharmacy advise?)  This is the patient's preferred pharmacy:  [id number] [place], [place] - [address] [facility] [address] [place] [address] Phone: [phone] Fax: [phone]  Is this the correct pharmacy for this prescription? Yes If no, delete pharmacy and type the correct one.   Has the prescription been filled recently? No  Is the patient out of the medication? Yes  Has the patient been seen for an appointment in the last year OR does the patient have an upcoming appointment? Yes  Can we respond through MyChart? Yes  Agent: Please be advised that Rx refills may take up to 3 business days. We ask that you follow-up with your pharmacy.

## 2024-05-12 NOTE — Progress Notes (Signed)
 "  Please attest and cosign this visit due to patients primary care provider not being in the office at the time the visit was completed.   Chief Complaint  Patient presents with   Medicare Wellness     Subjective:   Alexis [name] is a 69 y.o. female who presents for a Medicare Annual Wellness Visit.  Visit info / Clinical Intake: Medicare Wellness Visit Type:: Subsequent Annual Wellness Visit Persons participating in visit and providing information:: patient Medicare Wellness Visit Mode:: Telephone If telephone:: video declined Since this visit was completed virtually, some vitals may be partially provided or unavailable. Missing vitals are due to the limitations of the virtual format.: Documented vitals are patient reported If Telephone or Video please confirm:: I connected with patient using audio/video enable telemedicine. I verified patient identity with two identifiers, discussed telehealth limitations, and patient agreed to proceed. Patient Location:: home Provider Location:: home office Interpreter Needed?: No Pre-visit prep was completed: yes AWV questionnaire completed by patient prior to visit?: no Living arrangements:: lives with spouse/significant other Patient's Overall Health Status Rating: good Typical amount of pain: (!) a lot Does pain affect daily life?: (!) yes Are you currently prescribed opioids?: (!) yes  Dietary Habits and Nutritional Risks How many meals a day?: 2 Eats fruit and vegetables daily?: yes Most meals are obtained by: having others provide food In the last 2 weeks, have you had any of the following?: none Diabetic:: (!) yes Any non-healing wounds?: (!) yes (patient currently has bilateral weeping wounds. She has a HH nurse come out twice weekly to wrap her legs) How often do you check your BS?: 0  Functional Status Activities of Daily Living (to include ambulation/medication): (!) Needs Assist Feeding: Independent Dressing/Grooming:  Independent Bathing: Independent Toileting: Independent Transfer: Independent with device- listed below Ambulation: Independent with device- listed below Home Assistive Devices/Equipment: Johna Finder (specify Type); Wheelchair Medication Administration: Independent Home Management (perform basic housework or laundry): Needs assistance (comment) Manage your own finances?: yes Primary transportation is: driving Concerns about vision?: no *vision screening is required for WTM* Concerns about hearing?: no  Fall Screening Falls in the past year?: 0 Number of falls in past year: 0 Was there an injury with Fall?: 0 Fall Risk Category Calculator: 0 Patient Fall Risk Level: Low Fall Risk  Fall Risk Patient at Risk for Falls Due to: Impaired balance/gait; Impaired mobility Fall risk Follow up: Falls evaluation completed; Education provided; Falls prevention discussed  Home and Transportation Safety: All rugs have non-skid backing?: yes All stairs or steps have railings?: yes Grab bars in the bathtub or shower?: yes Have non-skid surface in bathtub or shower?: yes Good home lighting?: yes Regular seat belt use?: yes Hospital stays in the last year:: no  Cognitive Assessment Difficulty concentrating, remembering, or making decisions? : no Will 6CIT or Mini Cog be Completed: yes What year is it?: 0 points What month is it?: 0 points Give patient an address phrase to remember (5 components): [address] [place] About what time is it?: 0 points Count backwards from 20 to 1: 0 points Say the months of the year in reverse: 0 points Repeat the address phrase from earlier: 0 points 6 CIT Score: 0 points  Advance Directives (For Healthcare) Does Patient Have a Medical Advance Directive?: No Would patient like information on creating a medical advance directive?: No - Guardian declined  Reviewed/Updated  Reviewed/Updated: Reviewed All (Medical, Surgical, Family, Medications,  Allergies, Care Teams, Patient Goals)  Allergies (verified) Ace inhibitors, Ciprofloxacin, Cocoa, Flavoring agent (non-screening), Gabapentin, Prednisone , Sulfa antibiotics, and Sulfamethoxazole   Current Medications (verified) Outpatient Encounter Medications as of 05/06/2024  Medication Sig   Accu-Chek Softclix Lancets lancets    Accu-Chek Softclix Lancets lancets Use to test blood glucose up to four times daily as directed.   acetaminophen  (TYLENOL ) 500 MG tablet Take 500 mg by mouth 3 times/day as needed-between meals & bedtime for moderate pain. Pt takes 1000 mg 2 tablet bid   albuterol  (PROVENTIL ) (2.5 MG/3ML) 0.083% nebulizer solution Take 3 mLs (2.5 mg total) by nebulization every 6 (six) hours as needed for wheezing or shortness of breath.   albuterol  (VENTOLIN  HFA) 108 (90 Base) MCG/ACT inhaler Inhale 2 puffs into the lungs every 6 (six) hours as needed for wheezing or shortness of breath.   Blood Glucose Monitoring Suppl (BLOOD GLUCOSE MONITOR SYSTEM) w/Device KIT Use to test blood sugar in the morning, at noon, and at bedtime.   Blood Glucose Monitoring Suppl DEVI 1 each by Does not apply route in the morning, at noon, and at bedtime. May substitute to any manufacturer covered by patient's insurance.   budesonide  (PULMICORT ) 0.25 MG/2ML nebulizer solution Take 2 mLs (0.25 mg total) by nebulization daily.   budesonide -formoterol  (SYMBICORT ) 80-4.5 MCG/ACT inhaler Take 2 puffs first thing in am and then another 2 puffs about 12 hours later.   Calcium  Carbonate (CALCIUM  600 PO) Take by mouth. 2 chewables by mouth daily- Bariatric fusion   Cholecalciferol 1.25 MG (50000 UT) capsule Take by mouth.   clotrimazole  (CLOTRIMAZOLE  3) 2 % vaginal cream Apply intravaginally once daily for 3 days   digoxin  (LANOXIN ) 0.125 MG tablet Take 1 tablet (125 mcg total) by mouth daily.   diltiazem  (CARDIZEM  CD) 360 MG 24 hr capsule Take 1 capsule (360 mg total) by mouth daily.   furosemide  (LASIX ) 40  MG tablet Take 1 tablet (40 mg total) by mouth daily.   Glucose Blood (BLOOD GLUCOSE TEST STRIPS) STRP Use to test blood glucose in the morning, at noon, and at bedtime.   hydrOXYzine  (VISTARIL ) 25 MG capsule Take 1 capsule (25 mg total) by mouth every 6 (six) hours as needed for itching.   Lancet Device MISC 1 each by Does not apply route in the morning, at noon, and at bedtime. May substitute to any manufacturer covered by patient's insurance.   losartan  (COZAAR ) 100 MG tablet TAKE 1 TABLET BY MOUTH EVERY DAY   metoprolol  succinate (TOPROL -XL) 25 MG 24 hr tablet TAKE 1 TABLET BY MOUTH TWICE A DAY   montelukast  (SINGULAIR ) 10 MG tablet TAKE 1 TABLET BY MOUTH EVERY DAY   potassium chloride  SA (KLOR-CON  M) 20 MEQ tablet TAKE 1 TABLET BY MOUTH EVERY DAY   predniSONE  (DELTASONE ) 10 MG tablet 2 daily until cough/breathing improve then 1 daily x 5 days   promethazine -dextromethorphan (PROMETHAZINE -DM) 6.25-15 MG/5ML syrup Take 5 mLs by mouth 4 (four) times daily as needed.   RABEprazole  (ACIPHEX ) 20 MG tablet TAKE 1 TABLET BY MOUTH EVERY DAY   rosuvastatin  (CRESTOR ) 5 MG tablet TAKE 1 TABLET (5 MG TOTAL) BY MOUTH DAILY.   Semaglutide ,0.25 or 0.5MG /DOS, (OZEMPIC , 0.25 OR 0.5 MG/DOSE,) 2 MG/3ML SOPN Inject 0.5 mg into the skin once a week.   TIADYLT  ER 240 MG 24 hr capsule Take by mouth daily.   warfarin (COUMADIN ) 5 MG tablet TAKE 1 TABLET DAILY EXCEPT 1 1/2 TABLETS ON TUESDAYS, THURSDAYS AND SATURDAYS OR AS DIRECTED   [DISCONTINUED] HYDROcodone -acetaminophen  (NORCO) 7.5-325 MG  tablet Take 1 tablet by mouth every 4 (four) hours as needed for moderate pain (pain score 4-6) or severe pain (pain score 7-10).   No facility-administered encounter medications on file as of 05/06/2024.    History: Past Medical History:  Diagnosis Date   Asthma 01/21/2013   Dr. Darlean   Endometrial cancer [facility]) 01/21/2013   Dr. Arlee, stage I-10/14   HTN (hypertension)    Morbid obesity (HCC)    Paroxysmal A-fib (HCC)     Uterine cancer (HCC)    Past Surgical History:  Procedure Laterality Date   ABLATION     APPENDECTOMY     DILATION AND CURETTAGE OF UTERUS     NASAL SINUS SURGERY     Family History  Problem Relation Age of Onset   Heart attack Father    Hypertension Father    Diabetes Father    Hyperlipidemia Unknown    Social History   Occupational History   Not on file  Tobacco Use   Smoking status: Never   Smokeless tobacco: Never  Substance and Sexual Activity   Alcohol use: No   Drug use: Not on file   Sexual activity: Not on file   Tobacco Counseling Counseling given: Yes  SDOH Screenings   Food Insecurity: No Food Insecurity (05/06/2024)  Housing: High Risk (05/06/2024)  Transportation Needs: No Transportation Needs (05/06/2024)  Utilities: At Risk (05/06/2024)  Alcohol Screen: Low Risk (02/19/2023)  Depression (PHQ2-9): High Risk (05/06/2024)  Financial Resource Strain: High Risk (02/19/2023)  Physical Activity: Inactive (05/06/2024)  Social Connections: Moderately Integrated (05/06/2024)  Stress: Stress Concern Present (05/06/2024)  Tobacco Use: Low Risk (05/06/2024)  Health Literacy: Adequate Health Literacy (05/06/2024)   See flowsheets for full screening details  Depression Screen PHQ 2 & 9 Depression Scale- Over the past 2 weeks, how often have you been bothered by any of the following problems? Little interest or pleasure in doing things: 0 Feeling down, depressed, or hopeless (PHQ Adolescent also includes...irritable): 0 PHQ-2 Total Score: 0 Trouble falling or staying asleep, or sleeping too much: 3 Feeling tired or having little energy: 3 Poor appetite or overeating (PHQ Adolescent also includes...weight loss): 0 Feeling bad about yourself - or that you are a failure or have let yourself or your family down: 3 Trouble concentrating on things, such as reading the newspaper or watching television (PHQ Adolescent also includes...like school work): 0 Moving or speaking  so slowly that other people could have noticed. Or the opposite - being so fidgety or restless that you have been moving around a lot more than usual: 3 (due to bilateral leg wounds.) Thoughts that you would be better off dead, or of hurting yourself in some way: 0 PHQ-9 Total Score: 12 If you checked off any problems, how difficult have these problems made it for you to do your work, take care of things at home, or get along with other people?: Somewhat difficult  Depression Treatment Depression Interventions/Treatment : Patient refuses Treatment     Goals Addressed               This Visit's Progress     Lose weight so I can have a knee replacement (pt-stated)               Objective:    Today's Vitals   05/06/24 1458  Weight: (!) 330 lb (149.7 kg)  Height: 5' 5 (1.651 m)   Body mass index is 54.91 kg/m.  Hearing/Vision screen Hearing Screening - Comments::  Patient denies any hearing difficulties.   Vision Screening - Comments:: Patient is up to date on yearly eye exams with Willma Moats in [place]  Immunizations and Health Maintenance Health Maintenance  Topic Date Due   DTaP/Tdap/Td (1 - Tdap) Never done   Zoster Vaccines- Shingrix (1 of 2) Never done   Bone Density Scan  Never done   Mammogram  03/07/2022   Diabetic kidney evaluation - Urine ACR  05/06/2023   COVID-19 Vaccine (5 - 2025-26 season) 12/07/2023   OPHTHALMOLOGY EXAM  04/08/2024   Colonoscopy  01/22/2026 (Originally 04/25/2001)   FOOT EXAM  07/29/2024   HEMOGLOBIN A1C  08/08/2024   Diabetic kidney evaluation - eGFR measurement  02/08/2025   Medicare Annual Wellness (AWV)  05/06/2025   Pneumococcal Vaccine: 50+ Years (3 of 3 - PCV20 or PCV21) 11/14/2025   Influenza Vaccine  Completed   Hepatitis C Screening  Completed   Meningococcal B Vaccine  Aged Out        Assessment/Plan:  This is a routine wellness examination for [facility].  Patient Care Team: Del Wilhelmena Falter, Hilario, FNP as PCP -  General (Family Medicine) Darlean Ozell NOVAK, MD as Consulting Physician (Pulmonary Disease) Parrett, Madelin RAMAN, NP as Nurse Practitioner (Pulmonary Disease) Jeffrie Oneil BROCKS, MD as Consulting Physician (Cardiology) Dr [facility], OD (Optometry)  I have personally reviewed and noted the following in the patients chart:   Medical and social history Use of alcohol, tobacco or illicit drugs  Current medications and supplements including opioid prescriptions. Functional ability and status Nutritional status Physical activity Advanced directives List of other physicians Hospitalizations, surgeries, and ER visits in previous 12 months Vitals Screenings to include cognitive, depression, and falls Referrals and appointments  Orders Placed This Encounter  Procedures   DG Bone Density    Standing Status:   Future    Expiration Date:   05/06/2025    Reason for Exam (SYMPTOM  OR DIAGNOSIS REQUIRED):   post menopausal estrogen deficient    Preferred imaging location?:   [facility]   MM 3D SCREENING MAMMOGRAM BILATERAL BREAST    Standing Status:   Future    Expiration Date:   05/06/2025    Reason for Exam (SYMPTOM  OR DIAGNOSIS REQUIRED):   breast cancer screening    Preferred imaging location?:   [facility]   Ambulatory referral to Gastroenterology    Referral Priority:   Routine    Referral Type:   Consultation    Referral Reason:   Specialty Services Required    Referred to Provider:   Murry Leita MATSU, MD    Number of Visits Requested:   1   AMB Referral VBCI Care Management    Referral Priority:   Routine    Referral Type:   Consultation    Referral Reason:   Care Coordination    Number of Visits Requested:   1   In addition, I have reviewed and discussed with patient certain preventive protocols, quality metrics, and best practice recommendations. A written personalized care plan for preventive services as well as general preventive health  recommendations were provided to patient.   Kateena Degroote, CMA   05/12/2024   Return May 08, 2025 at 2:30 pm, for In office Medicare Well Visit w  Wellness Nurse.  After Visit Summary: (MyChart) Due to this being a telephonic visit, the after visit summary with patients personalized plan was offered to patient via MyChart    "

## 2024-05-12 NOTE — Telephone Encounter (Signed)
 Called the patient, both phone numbers not in service.

## 2024-05-13 ENCOUNTER — Other Ambulatory Visit: Payer: Self-pay

## 2024-05-13 DIAGNOSIS — G894 Chronic pain syndrome: Secondary | ICD-10-CM

## 2024-05-13 MED ORDER — HYDROCODONE-ACETAMINOPHEN 7.5-325 MG PO TABS: 1.0000 | ORAL_TABLET | Freq: Four times a day (QID) | ORAL | 0 refills | Status: AC | PRN

## 2024-05-16 ENCOUNTER — Ambulatory Visit

## 2024-05-18 ENCOUNTER — Ambulatory Visit (HOSPITAL_COMMUNITY)

## 2024-05-18 ENCOUNTER — Other Ambulatory Visit (HOSPITAL_COMMUNITY)

## 2024-05-23 ENCOUNTER — Ambulatory Visit: Admitting: Internal Medicine

## 2024-06-02 ENCOUNTER — Ambulatory Visit: Admitting: Internal Medicine

## 2024-06-27 ENCOUNTER — Ambulatory Visit: Admitting: Cardiology

## 2025-05-08 ENCOUNTER — Ambulatory Visit: Payer: Self-pay
